# Patient Record
Sex: Female | Born: 1955
Health system: Southern US, Community
[De-identification: ages and names within clinical notes are randomized; demographics above are authoritative.]

## PROBLEM LIST (undated history)

## (undated) DIAGNOSIS — F329 Major depressive disorder, single episode, unspecified: Secondary | ICD-10-CM

## (undated) DIAGNOSIS — I10 Essential (primary) hypertension: Secondary | ICD-10-CM

## (undated) DIAGNOSIS — E785 Hyperlipidemia, unspecified: Secondary | ICD-10-CM

## (undated) DIAGNOSIS — K5790 Diverticulosis of intestine, part unspecified, without perforation or abscess without bleeding: Secondary | ICD-10-CM

## (undated) DIAGNOSIS — I351 Nonrheumatic aortic (valve) insufficiency: Secondary | ICD-10-CM

## (undated) DIAGNOSIS — G709 Myoneural disorder, unspecified: Secondary | ICD-10-CM

## (undated) DIAGNOSIS — G4700 Insomnia, unspecified: Secondary | ICD-10-CM

## (undated) DIAGNOSIS — Z5189 Encounter for other specified aftercare: Secondary | ICD-10-CM

## (undated) DIAGNOSIS — F419 Anxiety disorder, unspecified: Secondary | ICD-10-CM

## (undated) DIAGNOSIS — M1990 Unspecified osteoarthritis, unspecified site: Secondary | ICD-10-CM

## (undated) DIAGNOSIS — F32A Depression, unspecified: Secondary | ICD-10-CM

## (undated) DIAGNOSIS — R011 Cardiac murmur, unspecified: Secondary | ICD-10-CM

## (undated) DIAGNOSIS — Z9889 Other specified postprocedural states: Secondary | ICD-10-CM

## (undated) DIAGNOSIS — M2550 Pain in unspecified joint: Secondary | ICD-10-CM

## (undated) DIAGNOSIS — C19 Malignant neoplasm of rectosigmoid junction: Secondary | ICD-10-CM

## (undated) DIAGNOSIS — C801 Malignant (primary) neoplasm, unspecified: Secondary | ICD-10-CM

## (undated) DIAGNOSIS — E7800 Pure hypercholesterolemia, unspecified: Secondary | ICD-10-CM

## (undated) DIAGNOSIS — Z9289 Personal history of other medical treatment: Secondary | ICD-10-CM

## (undated) DIAGNOSIS — G629 Polyneuropathy, unspecified: Secondary | ICD-10-CM

## (undated) DIAGNOSIS — D649 Anemia, unspecified: Secondary | ICD-10-CM

## (undated) DIAGNOSIS — R112 Nausea with vomiting, unspecified: Secondary | ICD-10-CM

## (undated) DIAGNOSIS — Z86010 Personal history of colonic polyps: Secondary | ICD-10-CM

## (undated) DIAGNOSIS — R351 Nocturia: Secondary | ICD-10-CM

## (undated) HISTORY — DX: Anxiety disorder, unspecified: F41.9

## (undated) HISTORY — DX: Depression, unspecified: F32.A

## (undated) HISTORY — DX: Essential (primary) hypertension: I10

## (undated) HISTORY — PX: JOINT REPLACEMENT: SHX530

## (undated) HISTORY — DX: Encounter for other specified aftercare: Z51.89

## (undated) HISTORY — DX: Unspecified osteoarthritis, unspecified site: M19.90

## (undated) HISTORY — DX: Major depressive disorder, single episode, unspecified: F32.9

## (undated) HISTORY — DX: Cardiac murmur, unspecified: R01.1

## (undated) HISTORY — DX: Malignant (primary) neoplasm, unspecified: C80.1

## (undated) HISTORY — DX: Hyperlipidemia, unspecified: E78.5

## (undated) HISTORY — PX: COLONOSCOPY: SHX174

## (undated) HISTORY — DX: Myoneural disorder, unspecified: G70.9

## (undated) HISTORY — PX: SPINE SURGERY: SHX786

## (undated) HISTORY — PX: COLON SURGERY: SHX602

## (undated) HISTORY — DX: Malignant neoplasm of rectosigmoid junction: C19

---

## 1989-07-11 HISTORY — PX: DILATION AND CURETTAGE OF UTERUS: SHX78

## 1998-02-13 ENCOUNTER — Other Ambulatory Visit: Admission: RE | Admit: 1998-02-13 | Discharge: 1998-02-13 | Payer: Self-pay | Admitting: Obstetrics and Gynecology

## 1999-05-28 ENCOUNTER — Other Ambulatory Visit: Admission: RE | Admit: 1999-05-28 | Discharge: 1999-05-28 | Payer: Self-pay | Admitting: Obstetrics and Gynecology

## 2001-02-02 ENCOUNTER — Other Ambulatory Visit: Admission: RE | Admit: 2001-02-02 | Discharge: 2001-02-02 | Payer: Self-pay | Admitting: Obstetrics and Gynecology

## 2002-07-11 DIAGNOSIS — C19 Malignant neoplasm of rectosigmoid junction: Secondary | ICD-10-CM | POA: Insufficient documentation

## 2002-07-11 HISTORY — DX: Malignant neoplasm of rectosigmoid junction: C19

## 2002-09-13 ENCOUNTER — Other Ambulatory Visit: Admission: RE | Admit: 2002-09-13 | Discharge: 2002-09-13 | Payer: Self-pay | Admitting: Obstetrics and Gynecology

## 2003-02-09 HISTORY — PX: COLECTOMY: SHX59

## 2003-02-20 ENCOUNTER — Encounter: Payer: Self-pay | Admitting: Internal Medicine

## 2003-02-20 ENCOUNTER — Ambulatory Visit (HOSPITAL_COMMUNITY): Admission: RE | Admit: 2003-02-20 | Discharge: 2003-02-20 | Payer: Self-pay | Admitting: Internal Medicine

## 2003-02-20 DIAGNOSIS — Z85038 Personal history of other malignant neoplasm of large intestine: Secondary | ICD-10-CM | POA: Insufficient documentation

## 2003-02-20 DIAGNOSIS — K5730 Diverticulosis of large intestine without perforation or abscess without bleeding: Secondary | ICD-10-CM

## 2003-02-20 DIAGNOSIS — D126 Benign neoplasm of colon, unspecified: Secondary | ICD-10-CM | POA: Insufficient documentation

## 2003-02-20 HISTORY — DX: Personal history of other malignant neoplasm of large intestine: Z85.038

## 2003-02-20 HISTORY — DX: Diverticulosis of large intestine without perforation or abscess without bleeding: K57.30

## 2003-02-20 HISTORY — DX: Benign neoplasm of colon, unspecified: D12.6

## 2003-03-05 ENCOUNTER — Encounter: Payer: Self-pay | Admitting: General Surgery

## 2003-03-10 ENCOUNTER — Encounter: Payer: Self-pay | Admitting: Internal Medicine

## 2003-03-10 ENCOUNTER — Inpatient Hospital Stay (HOSPITAL_COMMUNITY): Admission: RE | Admit: 2003-03-10 | Discharge: 2003-03-18 | Payer: Self-pay | Admitting: General Surgery

## 2003-03-10 ENCOUNTER — Encounter (INDEPENDENT_AMBULATORY_CARE_PROVIDER_SITE_OTHER): Payer: Self-pay | Admitting: *Deleted

## 2003-03-10 ENCOUNTER — Encounter (INDEPENDENT_AMBULATORY_CARE_PROVIDER_SITE_OTHER): Payer: Self-pay | Admitting: Specialist

## 2003-04-29 ENCOUNTER — Ambulatory Visit (HOSPITAL_BASED_OUTPATIENT_CLINIC_OR_DEPARTMENT_OTHER): Admission: RE | Admit: 2003-04-29 | Discharge: 2003-04-29 | Payer: Self-pay | Admitting: General Surgery

## 2003-04-29 ENCOUNTER — Encounter: Payer: Self-pay | Admitting: General Surgery

## 2003-05-09 ENCOUNTER — Ambulatory Visit: Admission: RE | Admit: 2003-05-09 | Discharge: 2003-07-09 | Payer: Self-pay | Admitting: *Deleted

## 2003-08-08 ENCOUNTER — Ambulatory Visit: Admission: RE | Admit: 2003-08-08 | Discharge: 2003-08-08 | Payer: Self-pay | Admitting: *Deleted

## 2003-10-10 ENCOUNTER — Other Ambulatory Visit: Admission: RE | Admit: 2003-10-10 | Discharge: 2003-10-10 | Payer: Self-pay | Admitting: Obstetrics and Gynecology

## 2004-02-10 ENCOUNTER — Ambulatory Visit (HOSPITAL_COMMUNITY): Admission: RE | Admit: 2004-02-10 | Discharge: 2004-02-10 | Payer: Self-pay | Admitting: Oncology

## 2004-03-05 ENCOUNTER — Encounter: Payer: Self-pay | Admitting: Internal Medicine

## 2004-03-05 DIAGNOSIS — K521 Toxic gastroenteritis and colitis: Secondary | ICD-10-CM

## 2004-04-13 ENCOUNTER — Ambulatory Visit (HOSPITAL_COMMUNITY): Admission: RE | Admit: 2004-04-13 | Discharge: 2004-04-13 | Payer: Self-pay | Admitting: Oncology

## 2004-05-13 ENCOUNTER — Encounter (HOSPITAL_COMMUNITY): Admission: RE | Admit: 2004-05-13 | Discharge: 2004-08-11 | Payer: Self-pay | Admitting: Oncology

## 2004-06-22 ENCOUNTER — Ambulatory Visit: Payer: Self-pay | Admitting: Oncology

## 2004-08-13 ENCOUNTER — Ambulatory Visit: Payer: Self-pay | Admitting: Oncology

## 2004-10-08 ENCOUNTER — Ambulatory Visit: Payer: Self-pay | Admitting: Oncology

## 2004-11-25 ENCOUNTER — Other Ambulatory Visit: Admission: RE | Admit: 2004-11-25 | Discharge: 2004-11-25 | Payer: Self-pay | Admitting: Obstetrics and Gynecology

## 2004-12-13 ENCOUNTER — Ambulatory Visit: Payer: Self-pay | Admitting: Oncology

## 2005-02-07 ENCOUNTER — Ambulatory Visit: Payer: Self-pay | Admitting: Oncology

## 2005-02-08 ENCOUNTER — Ambulatory Visit (HOSPITAL_COMMUNITY): Admission: RE | Admit: 2005-02-08 | Discharge: 2005-02-08 | Payer: Self-pay | Admitting: Oncology

## 2005-03-23 ENCOUNTER — Encounter: Admission: RE | Admit: 2005-03-23 | Discharge: 2005-03-23 | Payer: Self-pay | Admitting: Neurology

## 2005-04-04 ENCOUNTER — Ambulatory Visit: Payer: Self-pay | Admitting: Oncology

## 2005-06-06 ENCOUNTER — Ambulatory Visit: Payer: Self-pay | Admitting: Oncology

## 2005-08-08 ENCOUNTER — Ambulatory Visit: Payer: Self-pay | Admitting: Oncology

## 2005-08-11 HISTORY — PX: LUMBAR DISC SURGERY: SHX700

## 2005-09-02 ENCOUNTER — Ambulatory Visit (HOSPITAL_COMMUNITY): Admission: RE | Admit: 2005-09-02 | Discharge: 2005-09-03 | Payer: Self-pay | Admitting: Neurosurgery

## 2005-10-04 ENCOUNTER — Ambulatory Visit: Payer: Self-pay | Admitting: Oncology

## 2005-12-01 ENCOUNTER — Ambulatory Visit: Payer: Self-pay | Admitting: Oncology

## 2005-12-06 LAB — COMPREHENSIVE METABOLIC PANEL
ALT: 26 U/L (ref 0–40)
AST: 20 U/L (ref 0–37)
Albumin: 4.5 g/dL (ref 3.5–5.2)
Alkaline Phosphatase: 67 U/L (ref 39–117)
Calcium: 9 mg/dL (ref 8.4–10.5)
Chloride: 103 mEq/L (ref 96–112)
Creatinine, Ser: 0.7 mg/dL (ref 0.4–1.2)
Potassium: 4.1 mEq/L (ref 3.5–5.3)

## 2005-12-06 LAB — CBC WITH DIFFERENTIAL/PLATELET
Basophils Absolute: 0 10*3/uL (ref 0.0–0.1)
Eosinophils Absolute: 0.1 10*3/uL (ref 0.0–0.5)
HGB: 13.5 g/dL (ref 11.6–15.9)
MONO#: 0.5 10*3/uL (ref 0.1–0.9)
NEUT#: 3.1 10*3/uL (ref 1.5–6.5)
RBC: 4.61 10*6/uL (ref 3.70–5.32)
RDW: 13.3 % (ref 11.3–14.5)
WBC: 5.7 10*3/uL (ref 3.9–10.0)
lymph#: 2 10*3/uL (ref 0.9–3.3)

## 2005-12-06 LAB — CEA: CEA: 0.7 ng/mL (ref 0.0–5.0)

## 2006-01-26 ENCOUNTER — Ambulatory Visit: Payer: Self-pay | Admitting: Oncology

## 2006-02-16 ENCOUNTER — Other Ambulatory Visit: Admission: RE | Admit: 2006-02-16 | Discharge: 2006-02-16 | Payer: Self-pay | Admitting: Obstetrics and Gynecology

## 2006-03-02 ENCOUNTER — Ambulatory Visit: Payer: Self-pay | Admitting: Internal Medicine

## 2006-03-31 ENCOUNTER — Ambulatory Visit: Payer: Self-pay | Admitting: Oncology

## 2006-04-04 ENCOUNTER — Ambulatory Visit (HOSPITAL_COMMUNITY): Admission: RE | Admit: 2006-04-04 | Discharge: 2006-04-04 | Payer: Self-pay | Admitting: Oncology

## 2006-04-04 LAB — CBC WITH DIFFERENTIAL/PLATELET
BASO%: 0.4 % (ref 0.0–2.0)
Basophils Absolute: 0 10*3/uL (ref 0.0–0.1)
HGB: 14.2 g/dL (ref 11.6–15.9)
LYMPH%: 32.8 % (ref 14.0–48.0)
MCH: 30.2 pg (ref 26.0–34.0)
MCHC: 34.7 g/dL (ref 32.0–36.0)
MONO#: 0.5 10*3/uL (ref 0.1–0.9)
NEUT#: 3.2 10*3/uL (ref 1.5–6.5)
NEUT%: 56 % (ref 39.6–76.8)

## 2006-04-04 LAB — COMPREHENSIVE METABOLIC PANEL
ALT: 29 U/L (ref 0–40)
BUN: 13 mg/dL (ref 6–23)
CO2: 27 mEq/L (ref 19–32)
Creatinine, Ser: 0.76 mg/dL (ref 0.40–1.20)
Total Bilirubin: 0.6 mg/dL (ref 0.3–1.2)

## 2006-04-04 LAB — LACTATE DEHYDROGENASE: LDH: 131 U/L (ref 94–250)

## 2006-04-04 LAB — CEA: CEA: 0.7 ng/mL (ref 0.0–5.0)

## 2006-04-19 ENCOUNTER — Ambulatory Visit (HOSPITAL_COMMUNITY): Admission: RE | Admit: 2006-04-19 | Discharge: 2006-04-19 | Payer: Self-pay | Admitting: Oncology

## 2006-04-19 ENCOUNTER — Encounter (INDEPENDENT_AMBULATORY_CARE_PROVIDER_SITE_OTHER): Payer: Self-pay | Admitting: *Deleted

## 2006-05-09 ENCOUNTER — Ambulatory Visit (HOSPITAL_BASED_OUTPATIENT_CLINIC_OR_DEPARTMENT_OTHER): Admission: RE | Admit: 2006-05-09 | Discharge: 2006-05-09 | Payer: Self-pay | Admitting: General Surgery

## 2006-07-27 ENCOUNTER — Ambulatory Visit: Payer: Self-pay | Admitting: Oncology

## 2006-08-01 LAB — CBC WITH DIFFERENTIAL/PLATELET
Basophils Absolute: 0 10*3/uL (ref 0.0–0.1)
EOS%: 1 % (ref 0.0–7.0)
Eosinophils Absolute: 0.1 10*3/uL (ref 0.0–0.5)
HGB: 14.9 g/dL (ref 11.6–15.9)
LYMPH%: 27.9 % (ref 14.0–48.0)
MCH: 31.1 pg (ref 26.0–34.0)
MCV: 87.8 fL (ref 81.0–101.0)
MONO%: 6.7 % (ref 0.0–13.0)
Platelets: 180 10*3/uL (ref 145–400)
RDW: 12.4 % (ref 11.3–14.5)

## 2006-08-01 LAB — COMPREHENSIVE METABOLIC PANEL
Alkaline Phosphatase: 63 U/L (ref 39–117)
BUN: 17 mg/dL (ref 6–23)
CO2: 22 mEq/L (ref 19–32)
Creatinine, Ser: 0.79 mg/dL (ref 0.40–1.20)
Glucose, Bld: 121 mg/dL — ABNORMAL HIGH (ref 70–99)
Sodium: 140 mEq/L (ref 135–145)
Total Bilirubin: 0.6 mg/dL (ref 0.3–1.2)

## 2006-10-25 ENCOUNTER — Encounter: Admission: RE | Admit: 2006-10-25 | Discharge: 2006-10-25 | Payer: Self-pay | Admitting: Neurosurgery

## 2006-11-20 ENCOUNTER — Ambulatory Visit: Payer: Self-pay | Admitting: Oncology

## 2006-11-20 LAB — CBC WITH DIFFERENTIAL/PLATELET
BASO%: 0.6 % (ref 0.0–2.0)
EOS%: 0.9 % (ref 0.0–7.0)
LYMPH%: 28.4 % (ref 14.0–48.0)
MCH: 30.6 pg (ref 26.0–34.0)
MCHC: 35.4 g/dL (ref 32.0–36.0)
MCV: 86.6 fL (ref 81.0–101.0)
MONO%: 7.5 % (ref 0.0–13.0)
NEUT%: 62.6 % (ref 39.6–76.8)
Platelets: 196 10*3/uL (ref 145–400)
RBC: 4.47 10*6/uL (ref 3.70–5.32)
WBC: 6.3 10*3/uL (ref 3.9–10.0)

## 2006-11-20 LAB — COMPREHENSIVE METABOLIC PANEL
ALT: 16 U/L (ref 0–35)
Alkaline Phosphatase: 70 U/L (ref 39–117)
Creatinine, Ser: 0.74 mg/dL (ref 0.40–1.20)
Sodium: 139 mEq/L (ref 135–145)
Total Bilirubin: 0.4 mg/dL (ref 0.3–1.2)
Total Protein: 7.5 g/dL (ref 6.0–8.3)

## 2007-02-19 ENCOUNTER — Ambulatory Visit: Payer: Self-pay | Admitting: Internal Medicine

## 2007-03-15 ENCOUNTER — Ambulatory Visit: Payer: Self-pay | Admitting: Oncology

## 2007-03-19 LAB — CEA: CEA: 0.9 ng/mL (ref 0.0–5.0)

## 2007-03-19 LAB — COMPREHENSIVE METABOLIC PANEL
Alkaline Phosphatase: 58 U/L (ref 39–117)
BUN: 22 mg/dL (ref 6–23)
Creatinine, Ser: 0.8 mg/dL (ref 0.40–1.20)
Glucose, Bld: 100 mg/dL — ABNORMAL HIGH (ref 70–99)
Sodium: 137 mEq/L (ref 135–145)
Total Bilirubin: 0.6 mg/dL (ref 0.3–1.2)
Total Protein: 7.5 g/dL (ref 6.0–8.3)

## 2007-03-19 LAB — CBC WITH DIFFERENTIAL/PLATELET
Basophils Absolute: 0 10*3/uL (ref 0.0–0.1)
EOS%: 0.7 % (ref 0.0–7.0)
Eosinophils Absolute: 0 10*3/uL (ref 0.0–0.5)
HCT: 38.5 % (ref 34.8–46.6)
HGB: 14.1 g/dL (ref 11.6–15.9)
MCH: 31.6 pg (ref 26.0–34.0)
MCV: 86.6 fL (ref 81.0–101.0)
NEUT#: 3.6 10*3/uL (ref 1.5–6.5)
NEUT%: 62 % (ref 39.6–76.8)
RDW: 12 % (ref 11.3–14.5)
lymph#: 1.7 10*3/uL (ref 0.9–3.3)

## 2007-04-17 ENCOUNTER — Encounter (INDEPENDENT_AMBULATORY_CARE_PROVIDER_SITE_OTHER): Payer: Self-pay | Admitting: *Deleted

## 2007-04-17 ENCOUNTER — Ambulatory Visit (HOSPITAL_COMMUNITY): Admission: RE | Admit: 2007-04-17 | Discharge: 2007-04-17 | Payer: Self-pay | Admitting: Oncology

## 2007-07-17 ENCOUNTER — Ambulatory Visit: Payer: Self-pay | Admitting: Oncology

## 2007-07-19 LAB — COMPREHENSIVE METABOLIC PANEL
ALT: 27 U/L (ref 0–35)
Albumin: 4.5 g/dL (ref 3.5–5.2)
BUN: 17 mg/dL (ref 6–23)
CO2: 24 mEq/L (ref 19–32)
Calcium: 9.2 mg/dL (ref 8.4–10.5)
Chloride: 105 mEq/L (ref 96–112)
Creatinine, Ser: 0.75 mg/dL (ref 0.40–1.20)
Potassium: 4 mEq/L (ref 3.5–5.3)

## 2007-07-19 LAB — CBC WITH DIFFERENTIAL/PLATELET
Basophils Absolute: 0 10*3/uL (ref 0.0–0.1)
HCT: 40.5 % (ref 34.8–46.6)
HGB: 14.2 g/dL (ref 11.6–15.9)
MONO#: 0.5 10*3/uL (ref 0.1–0.9)
NEUT#: 3.5 10*3/uL (ref 1.5–6.5)
NEUT%: 62.9 % (ref 39.6–76.8)
RDW: 12.2 % (ref 11.3–14.5)
WBC: 5.5 10*3/uL (ref 3.9–10.0)
lymph#: 1.4 10*3/uL (ref 0.9–3.3)

## 2007-07-19 LAB — CEA: CEA: <0.5 ng/mL (ref 0.0–5.0)

## 2007-10-13 DIAGNOSIS — K429 Umbilical hernia without obstruction or gangrene: Secondary | ICD-10-CM

## 2007-10-13 DIAGNOSIS — G609 Hereditary and idiopathic neuropathy, unspecified: Secondary | ICD-10-CM

## 2007-10-13 DIAGNOSIS — I10 Essential (primary) hypertension: Secondary | ICD-10-CM | POA: Insufficient documentation

## 2007-10-13 HISTORY — DX: Umbilical hernia without obstruction or gangrene: K42.9

## 2007-10-13 HISTORY — DX: Hereditary and idiopathic neuropathy, unspecified: G60.9

## 2007-10-13 HISTORY — DX: Essential (primary) hypertension: I10

## 2007-11-15 ENCOUNTER — Ambulatory Visit: Payer: Self-pay | Admitting: Oncology

## 2007-11-19 ENCOUNTER — Encounter: Payer: Self-pay | Admitting: Internal Medicine

## 2007-11-19 LAB — CBC WITH DIFFERENTIAL/PLATELET
Basophils Absolute: 0 10*3/uL (ref 0.0–0.1)
HCT: 37.7 % (ref 34.8–46.6)
HGB: 13.4 g/dL (ref 11.6–15.9)
MONO#: 0.4 10*3/uL (ref 0.1–0.9)
NEUT#: 2.8 10*3/uL (ref 1.5–6.5)
NEUT%: 60.4 % (ref 39.6–76.8)
WBC: 4.7 10*3/uL (ref 3.9–10.0)
lymph#: 1.4 10*3/uL (ref 0.9–3.3)

## 2007-11-19 LAB — COMPREHENSIVE METABOLIC PANEL
ALT: 22 U/L (ref 0–35)
Albumin: 4.2 g/dL (ref 3.5–5.2)
BUN: 18 mg/dL (ref 6–23)
CO2: 24 mEq/L (ref 19–32)
Calcium: 8.9 mg/dL (ref 8.4–10.5)
Chloride: 105 mEq/L (ref 96–112)
Creatinine, Ser: 0.77 mg/dL (ref 0.40–1.20)

## 2007-11-19 LAB — LACTATE DEHYDROGENASE: LDH: 126 U/L (ref 94–250)

## 2008-04-04 ENCOUNTER — Ambulatory Visit: Payer: Self-pay | Admitting: Oncology

## 2008-04-08 ENCOUNTER — Encounter: Payer: Self-pay | Admitting: Internal Medicine

## 2008-04-08 ENCOUNTER — Ambulatory Visit (HOSPITAL_COMMUNITY): Admission: RE | Admit: 2008-04-08 | Discharge: 2008-04-08 | Payer: Self-pay | Admitting: Oncology

## 2008-04-08 LAB — CBC WITH DIFFERENTIAL/PLATELET
Basophils Absolute: 0 10*3/uL (ref 0.0–0.1)
EOS%: 1.4 % (ref 0.0–7.0)
Eosinophils Absolute: 0.1 10*3/uL (ref 0.0–0.5)
LYMPH%: 19.5 % (ref 14.0–48.0)
MCH: 31.7 pg (ref 26.0–34.0)
MCV: 89.8 fL (ref 81.0–101.0)
MONO%: 7.1 % (ref 0.0–13.0)
NEUT#: 4.1 10*3/uL (ref 1.5–6.5)
Platelets: 159 10*3/uL (ref 145–400)
RBC: 4.52 10*6/uL (ref 3.70–5.32)
RDW: 12.2 % (ref 11.3–14.5)

## 2008-04-08 LAB — COMPREHENSIVE METABOLIC PANEL WITH GFR
ALT: 26 U/L (ref 0–35)
AST: 17 U/L (ref 0–37)
Albumin: 4.2 g/dL (ref 3.5–5.2)
Alkaline Phosphatase: 65 U/L (ref 39–117)
BUN: 13 mg/dL (ref 6–23)
CO2: 25 meq/L (ref 19–32)
Calcium: 9 mg/dL (ref 8.4–10.5)
Chloride: 104 meq/L (ref 96–112)
Creatinine, Ser: 0.82 mg/dL (ref 0.40–1.20)
Glucose, Bld: 142 mg/dL — ABNORMAL HIGH (ref 70–99)
Potassium: 4.6 meq/L (ref 3.5–5.3)
Sodium: 140 meq/L (ref 135–145)
Total Bilirubin: 0.6 mg/dL (ref 0.3–1.2)
Total Protein: 7.1 g/dL (ref 6.0–8.3)

## 2008-04-08 LAB — LACTATE DEHYDROGENASE: LDH: 135 U/L (ref 94–250)

## 2008-09-25 ENCOUNTER — Ambulatory Visit: Payer: Self-pay | Admitting: Oncology

## 2008-09-29 ENCOUNTER — Encounter: Payer: Self-pay | Admitting: Internal Medicine

## 2008-09-29 LAB — COMPREHENSIVE METABOLIC PANEL
ALT: 28 U/L (ref 0–35)
AST: 20 U/L (ref 0–37)
Albumin: 3.9 g/dL (ref 3.5–5.2)
Alkaline Phosphatase: 45 U/L (ref 39–117)
BUN: 17 mg/dL (ref 6–23)
Calcium: 9 mg/dL (ref 8.4–10.5)
Chloride: 100 mEq/L (ref 96–112)
Potassium: 4 mEq/L (ref 3.5–5.3)
Sodium: 135 mEq/L (ref 135–145)

## 2008-09-29 LAB — CBC WITH DIFFERENTIAL/PLATELET
BASO%: 0.2 % (ref 0.0–2.0)
Basophils Absolute: 0 10*3/uL (ref 0.0–0.1)
EOS%: 0.4 % (ref 0.0–7.0)
HGB: 13 g/dL (ref 11.6–15.9)
MCH: 31.8 pg (ref 25.1–34.0)
MCHC: 35.4 g/dL (ref 31.5–36.0)
MCV: 89.7 fL (ref 79.5–101.0)
MONO%: 8.6 % (ref 0.0–14.0)
RDW: 12.2 % (ref 11.2–14.5)
lymph#: 1.4 10*3/uL (ref 0.9–3.3)

## 2008-09-29 LAB — LACTATE DEHYDROGENASE: LDH: 114 U/L (ref 94–250)

## 2008-11-26 ENCOUNTER — Ambulatory Visit (HOSPITAL_COMMUNITY): Admission: RE | Admit: 2008-11-26 | Discharge: 2008-11-26 | Payer: Self-pay | Admitting: General Surgery

## 2008-11-26 ENCOUNTER — Encounter (INDEPENDENT_AMBULATORY_CARE_PROVIDER_SITE_OTHER): Payer: Self-pay | Admitting: *Deleted

## 2009-03-23 ENCOUNTER — Encounter (INDEPENDENT_AMBULATORY_CARE_PROVIDER_SITE_OTHER): Payer: Self-pay | Admitting: *Deleted

## 2009-03-24 ENCOUNTER — Ambulatory Visit: Payer: Self-pay | Admitting: Oncology

## 2009-03-26 ENCOUNTER — Ambulatory Visit (HOSPITAL_COMMUNITY): Admission: RE | Admit: 2009-03-26 | Discharge: 2009-03-26 | Payer: Self-pay | Admitting: Oncology

## 2009-03-26 ENCOUNTER — Encounter: Payer: Self-pay | Admitting: Internal Medicine

## 2009-03-26 LAB — CEA: CEA: 0.9 ng/mL (ref 0.0–5.0)

## 2009-03-26 LAB — COMPREHENSIVE METABOLIC PANEL
ALT: 31 U/L (ref 0–35)
Albumin: 4 g/dL (ref 3.5–5.2)
CO2: 25 mEq/L (ref 19–32)
Calcium: 8.9 mg/dL (ref 8.4–10.5)
Chloride: 105 mEq/L (ref 96–112)
Glucose, Bld: 110 mg/dL — ABNORMAL HIGH (ref 70–99)
Potassium: 3.7 mEq/L (ref 3.5–5.3)
Sodium: 135 mEq/L (ref 135–145)
Total Protein: 7.1 g/dL (ref 6.0–8.3)

## 2009-03-26 LAB — CBC WITH DIFFERENTIAL/PLATELET
BASO%: 0.2 % (ref 0.0–2.0)
Eosinophils Absolute: 0.1 10*3/uL (ref 0.0–0.5)
LYMPH%: 23.7 % (ref 14.0–49.7)
MCHC: 34.3 g/dL (ref 31.5–36.0)
MONO#: 0.6 10*3/uL (ref 0.1–0.9)
NEUT#: 3.8 10*3/uL (ref 1.5–6.5)
Platelets: 149 10*3/uL (ref 145–400)
RBC: 4.22 10*6/uL (ref 3.70–5.45)
RDW: 12.3 % (ref 11.2–14.5)
WBC: 5.9 10*3/uL (ref 3.9–10.3)
lymph#: 1.4 10*3/uL (ref 0.9–3.3)

## 2009-03-26 LAB — LACTATE DEHYDROGENASE: LDH: 105 U/L (ref 94–250)

## 2009-05-01 ENCOUNTER — Ambulatory Visit: Payer: Self-pay | Admitting: Internal Medicine

## 2009-07-16 ENCOUNTER — Ambulatory Visit: Payer: Self-pay | Admitting: Internal Medicine

## 2009-07-17 ENCOUNTER — Encounter: Payer: Self-pay | Admitting: Internal Medicine

## 2009-09-21 ENCOUNTER — Ambulatory Visit: Payer: Self-pay | Admitting: Oncology

## 2009-09-24 ENCOUNTER — Encounter: Payer: Self-pay | Admitting: Internal Medicine

## 2009-09-24 LAB — CBC WITH DIFFERENTIAL/PLATELET
BASO%: 0.3 % (ref 0.0–2.0)
EOS%: 0.8 % (ref 0.0–7.0)
HCT: 37.1 % (ref 34.8–46.6)
MCH: 32.5 pg (ref 25.1–34.0)
MCHC: 35.6 g/dL (ref 31.5–36.0)
MONO%: 10 % (ref 0.0–14.0)
NEUT%: 66.4 % (ref 38.4–76.8)
lymph#: 1.2 10*3/uL (ref 0.9–3.3)

## 2009-09-24 LAB — COMPREHENSIVE METABOLIC PANEL
ALT: 23 U/L (ref 0–35)
AST: 22 U/L (ref 0–37)
Alkaline Phosphatase: 40 U/L (ref 39–117)
Calcium: 8.7 mg/dL (ref 8.4–10.5)
Chloride: 101 mEq/L (ref 96–112)
Creatinine, Ser: 0.78 mg/dL (ref 0.40–1.20)
Total Bilirubin: 0.5 mg/dL (ref 0.3–1.2)

## 2010-03-23 ENCOUNTER — Ambulatory Visit: Payer: Self-pay | Admitting: Oncology

## 2010-03-26 ENCOUNTER — Ambulatory Visit (HOSPITAL_COMMUNITY)
Admission: RE | Admit: 2010-03-26 | Discharge: 2010-03-26 | Payer: Self-pay | Source: Home / Self Care | Admitting: Oncology

## 2010-03-26 ENCOUNTER — Encounter: Payer: Self-pay | Admitting: Internal Medicine

## 2010-03-26 LAB — CBC WITH DIFFERENTIAL/PLATELET
Eosinophils Absolute: 0 10*3/uL (ref 0.0–0.5)
MONO#: 0.4 10*3/uL (ref 0.1–0.9)
NEUT#: 3.6 10*3/uL (ref 1.5–6.5)
RBC: 4.39 10*6/uL (ref 3.70–5.45)
RDW: 12.1 % (ref 11.2–14.5)
WBC: 5.7 10*3/uL (ref 3.9–10.3)

## 2010-03-26 LAB — COMPREHENSIVE METABOLIC PANEL
Albumin: 4.4 g/dL (ref 3.5–5.2)
Alkaline Phosphatase: 44 U/L (ref 39–117)
CO2: 27 mEq/L (ref 19–32)
Glucose, Bld: 110 mg/dL — ABNORMAL HIGH (ref 70–99)
Potassium: 3.9 mEq/L (ref 3.5–5.3)
Sodium: 136 mEq/L (ref 135–145)
Total Protein: 7.6 g/dL (ref 6.0–8.3)

## 2010-03-26 LAB — CEA: CEA: 0.6 ng/mL (ref 0.0–5.0)

## 2010-03-26 LAB — LACTATE DEHYDROGENASE: LDH: 107 U/L (ref 94–250)

## 2010-08-10 NOTE — Letter (Signed)
Summary: [facility]  [facility]   Imported By: Lennie Odor 04/09/2010 10:17:37  _____________________________________________________________________  External Attachment:    Type:   Image     Comment:   External Document

## 2010-08-10 NOTE — Letter (Signed)
Summary: Patient Notice- Colon Biospy Results  [facility]  [address] [place], [place] [address]   Phone: [phone]  Fax: [phone]        July 17, 2009 MRN: [id number]    [name] [name] [address] [place], [place]  [address]    Dear [name],  I am pleased to inform you that the biopsies taken during your recent colonoscopy did not show any evidence of cancer upon pathologic examination.The polyps show normal colon tissue  Additional information/recommendations:  _x_No further action is needed at this time.  Please follow-up with      your primary care physician for your other healthcare needs.  __Please call [phone] to schedule a return visit to review      your condition.  __Continue with the treatment plan as outlined on the day of your      exam.  _x_You should have a repeat colonoscopy examination for this problem           in 5_ years.  Please call us if you are having persistent problems or have questions about your condition that have not been fully answered at this time.  Sincerely,  Hart Carwin MD   This letter has been electronically signed by your physician.  Appended Document: Patient Notice- Colon Biospy Results Letter mailed 01.12.11

## 2010-08-10 NOTE — Procedures (Signed)
Summary: Colonoscopy  Patient: [name] [name] Note: All result statuses are Final unless otherwise noted.  Tests: (1) Colonoscopy (COL)   COL Colonoscopy           DONE     [facility]     [address]     [place], [place]  [address]           COLONOSCOPY PROCEDURE REPORT           PATIENT:  [name], [name]  MR#:  [id number]     BIRTHDATE:  Jan 15, 1956, 53 yrs. old  GENDER:  female           ENDOSCOPIST:  Hedwig Morton. Juanda Chance, MD     Referred by:  Kimberlee Nearing, M.D.           PROCEDURE DATE:  07/16/2009     PROCEDURE:  Surveillance Colonoscopy     ASA CLASS:  Class II     INDICATIONS:  history of colon cancer sigmoid carcinoma 2004, s/p     sigmoid resection, radiation and chemoRx, last colon 2007     IBS-like symptoms           MEDICATIONS:   Versed 6 mg, Fentanyl 50 mcg           DESCRIPTION OF PROCEDURE:   After the risks benefits and     alternatives of the procedure were thoroughly explained, informed     consent was obtained.  Digital rectal exam was performed and     revealed no rectal masses.   The LB CF-[id number] endoscope     was introduced through the anus and advanced to the cecum, which     was identified by both the appendix and ileocecal valve, without     limitations.  The quality of the prep was good, using MiraLax.     The instrument was then slowly withdrawn as the colon was fully     examined.     <<PROCEDUREIMAGES>>           FINDINGS:  There was evidence of a prior segmental colectomy. in     the sigmoid colon. anastomosis at 5 cm, wide open, With standard     forceps, biopsy was obtained and sent to pathology (see image8 and     image7).  Mild diverticulosis was found throughout the colon (see     image6 and image5).  This was otherwise a normal examination of     the colon (see image1, image2, image3, and image9). mild radiation     changes in the rectum   Retroflexed views in the rectum revealed     no abnormalities.    The scope was  then withdrawn from the patient     and the procedure completed.           COMPLICATIONS:  None           ENDOSCOPIC IMPRESSION:     1) Prior segmental colectomy in the sigmoid colon     2) Mild diverticulosis throughout the colon     3) Otherwise normal examination     no evidence of recurrent carcinoma, functioning colorectal     anastomosis     RECOMMENDATIONS:     1) high fiber diet     2) Await biopsy results     Try Probiotics for IBS-like symptoms     Try Benefiber 1 tablespoon daily           REPEAT EXAM:  In 5 year(s) for.           ______________________________     Hedwig Morton. Juanda Chance, MD           CC:           n.     eSIGNED:   Hedwig Morton. Brodie at 07/16/2009 11:27 AM           Boris Sharper, [id number]  Note: An exclamation mark (!) indicates a result that was not dispersed into the flowsheet. Document Creation Date: 07/16/2009 1:00 PM _______________________________________________________________________  (1) Order result status: Final Collection or observation date-time: 07/16/2009 11:17 Requested date-time:  Receipt date-time:  Reported date-time:  Referring Physician:   Ordering Physician: Lina Sar [phone]) Specimen Source:  Source: Launa Grill Order Number: [id number] Lab site:   Appended Document: Colonoscopy recall colon in 5 years  Appended Document: Colonoscopy     Procedures Next Due Date:    Colonoscopy: 07/2014

## 2010-08-10 NOTE — Letter (Signed)
[facility]   Imported By: Lester Dibble 10/12/2009 09:52:52  _____________________________________________________________________  External Attachment:    Type:   Image     Comment:   External Document

## 2010-10-01 ENCOUNTER — Other Ambulatory Visit (HOSPITAL_COMMUNITY): Payer: Self-pay | Admitting: Oncology

## 2010-10-01 ENCOUNTER — Encounter (HOSPITAL_BASED_OUTPATIENT_CLINIC_OR_DEPARTMENT_OTHER): Payer: BC Managed Care – PPO | Admitting: Oncology

## 2010-10-01 DIAGNOSIS — Z85048 Personal history of other malignant neoplasm of rectum, rectosigmoid junction, and anus: Secondary | ICD-10-CM

## 2010-10-01 LAB — COMPREHENSIVE METABOLIC PANEL
AST: 26 U/L (ref 0–37)
Alkaline Phosphatase: 43 U/L (ref 39–117)
BUN: 16 mg/dL (ref 6–23)
Calcium: 8.9 mg/dL (ref 8.4–10.5)
Creatinine, Ser: 0.83 mg/dL (ref 0.40–1.20)
Glucose, Bld: 108 mg/dL — ABNORMAL HIGH (ref 70–99)
Potassium: 3.9 mEq/L (ref 3.5–5.3)
Sodium: 136 mEq/L (ref 135–145)

## 2010-10-01 LAB — CBC WITH DIFFERENTIAL/PLATELET
Basophils Absolute: 0 10*3/uL (ref 0.0–0.1)
Eosinophils Absolute: 0 10*3/uL (ref 0.0–0.5)
HCT: 35.8 % (ref 34.8–46.6)
HGB: 12.6 g/dL (ref 11.6–15.9)
LYMPH%: 25.1 % (ref 14.0–49.7)
MCV: 89.1 fL (ref 79.5–101.0)
MONO%: 10 % (ref 0.0–14.0)
NEUT#: 2.8 10*3/uL (ref 1.5–6.5)
NEUT%: 63.6 % (ref 38.4–76.8)
Platelets: 148 10*3/uL (ref 145–400)

## 2010-10-01 LAB — LACTATE DEHYDROGENASE: LDH: 103 U/L (ref 94–250)

## 2010-10-19 LAB — BASIC METABOLIC PANEL
CO2: 28 mEq/L (ref 19–32)
Calcium: 9.7 mg/dL (ref 8.4–10.5)
GFR calc Af Amer: >60 mL/min (ref >60)
GFR calc non Af Amer: >60 mL/min (ref >60)
Glucose, Bld: 112 mg/dL — ABNORMAL HIGH (ref 70–99)
Potassium: 5.1 mEq/L (ref 3.5–5.1)
Sodium: 140 mEq/L (ref 135–145)

## 2010-10-19 LAB — HEMOGLOBIN AND HEMATOCRIT, BLOOD: HCT: 37.6 % (ref 36.0–46.0)

## 2010-11-23 NOTE — Assessment & Plan Note (Signed)
[name] [name]                         GASTROENTEROLOGY OFFICE NOTE   NAME:[name], [name] MERAZ                      MRN:          [id number]  DATE:02/19/2007                            DOB:          1956/06/19    [name] [name] is a very nice 55 year old patient of Dr. Arline [name] in whom  was found sigmoid carcinoma, T3, N0, Stage II in August 2004.  She  underwent radiation and subsequent resection by Dr. Johna [name] in August  2004.  She also had chemotherapy from which she developed peripheral  neuropathy.  She has been disease free.  Last colonoscopy by me was  August 2005.  Last fall when requested colonoscopy, the patient's  insurance company, [name], recommended for her to go to  [name], which is not [name] accredited, and had a  colonoscopy there because it was cheaper.  The patient had a  colonoscopy, and we have the report from Dr. Noe [name] that her colon exam  was normal including the sigmoid anastomosis.  The patient since then  has had intermittent rectal bleeding and alternating diarrhea and  constipation.  More so, she is complaining of bloating, especially in  the morning, abdominal discomfort.  Last CT scan of the abdomen and  pelvis was in October 2007 by Dr. Arline [name] and did not show anything  suspicious.  We do not have the exact report, but from Dr. Mamie [name]  last Progress Note, there was a small umbilical hernia and areas of  vague shadowing or nodularity laterally within the left upper quadrant  and posterior medially within the right lower lobe, most likely  representing post inflammatory changes.  Her CA level was normal, the  last one 0.6.   MEDICATIONS:  1. Baclofen 75 mg p.o. daily.  2. Ambien 5 mg p.r.n.   PHYSICAL EXAMINATION:  VITAL SIGNS: Blood pressure 152/84, pulse 100.  Weight 172 pounds.  GENERAL:  The patient is overweight, in no distress.  CARDIOVASCULAR:  Rapid S1, S2.  ABDOMEN:  Protuberant  with normoactive bowel sounds.  Tender across the  lower abdomen, especially left lower quadrant.  Also some discomfort in  the right lower quadrant.  No fullness.  Upper abdomen was unremarkable.  Vertical surgical scar below the umbilicus.  Liver edge was at costal  margin.  No ascites.   Endoscopic exam shows erythematous perianal area with normal rectal  tone.  Proctitis due to radiation in the anal canal and left ampulla  showing some friability and spontaneous bleeding induced by contact with  the anoscope.  Stool was hemoccult positive with frank blood on the  tissue after the exam.   IMPRESSION:  23. A 55 year old white female with radiation proctitis since 2004      which explains her rectal bleeding.  2. Rectal sigmoid carcinoma 2004 status post radiation, resection, and      chemotherapy. Last colonoscopy in [name] 2007 was      unremarkable.  3. Bloating, abdominal discomfort, and irregular bowel habits.  May be      due to irritable bowel syndrome, possibly due to radiation  enteritis, or due to bacterial overgrowth.   PLAN:  1. Canasa suppositories 1000 mg nightly.  2. Bentyl 10 mg p.o. b.i.d. for antispasmodic effect.  3. Sample of probiotic Align 1 p.o. daily.  4. Booklet on gas.  5. Return in 2 months.  If symptoms do not improve, consider CT scan      of abdomen and pelvic floor to look for adenopathy or some      abnormality pertaining to her previous carcinoma.     [name] [name]. [name] Chance, MD  Electronically Signed    DMB/MedQ  DD: 02/19/2007  DT: 02/20/2007  Job #: [id number]   cc:   [name] [name], M.D.  [name] [name]. [name] [name], M.D.

## 2010-11-23 NOTE — Op Note (Signed)
[name] [name], [name] [name] NO.:  [id number]   MEDICAL RECORD NO.:  [id number]          PATIENT TYPE:  AMB   LOCATION:  DAY                          FACILITY:  [facility]   PHYSICIAN:  Sharlet Salina T. Hoxworth, M.D.DATE OF BIRTH:  [birth year]   DATE OF PROCEDURE:  11/26/2008  DATE OF DISCHARGE:                               OPERATIVE REPORT   PREOPERATIVE DIAGNOSIS:  Chronic anal fissure.   POSTOPERATIVE DIAGNOSIS:  Chronic anal fissure.   SURGICAL PROCEDURES:  Lateral internal anal sphincterotomy.   SURGEON:  Dr. Johna Sheriff.   ANESTHESIA:  LMA general.   BRIEF HISTORY:  [name] [name] is a 55 year old female who presented a  number of months ago with a posterior midline fissure.  This has  responded partially to diltiazem cream but she has had recurrent and  persistent symptoms lately despite medical management.  I have  recommended proceeding with internal anal sphincterotomy.  She has a  significant history of low anterior resection of the rectum for  adenocarcinoma following neoadjuvant chemoradiation therapy several  years ago.  Nature of procedure, indications, risks of bleeding,  infection, nonhealing and rare degrees of incontinence were discussed  and understood.  She is now brought to operating room for this  procedure.   DESCRIPTION OF OPERATION:  The patient brought to the operating room,  placed in supine position on the operating table and laryngeal mask  general anesthesia was induced.  She was then carefully positioned in  lithotomy position, the perineum sterilely prepped and draped.  Correct  patient and procedure were verified.  PAS were placed.  Examination of  anus with a Sims retractor showed a chronic posterior midline fissure  and obvious hypertrophy of the internal anal sphincter.  The  intersphincteric groove was identified in the right lateral position and  a small stab incision made.  Hemostat was inserted into the  intersphincteric groove and  the distal portion of the internal anal  sphincter elevated up into the wound.  The muscle actually fractured and  divided before it could be cauterized, possibly a little fibrotic and  stiff secondary to the radiation history.  This, however, resulted in  planned relaxation of the muscle.  Hemostasis was obtained with cautery  and pressure.  Thirty mL of Marcaine with epinephrine was injected as a  perianal block.  Gauze and Xylocaine dressing were applied and the  patient taken to recovery in good condition.      Lorne Skeens. Hoxworth, M.D.  Electronically Signed     BTH/MEDQ  D:  11/26/2008  T:  11/26/2008  Job:  [id number]

## 2010-11-26 NOTE — Discharge Summary (Signed)
NAMEREBEL, LAUGHRIDGE                         ACCOUNT NO.:  [id number]   MEDICAL RECORD NO.:  [id number]                   PATIENT TYPE:  INP   LOCATION:  0355                                 FACILITY:  [facility]   PHYSICIAN:  Sharlet Salina T. Hoxworth, M.D.          DATE OF BIRTH:  09-28-1955   DATE OF ADMISSION:  03/10/2003  DATE OF DISCHARGE:  03/18/2003                                 DISCHARGE SUMMARY   DISCHARGE DIAGNOSIS:  Adenocarcinoma of the rectosigmoid.   OPERATIONS AND PROCEDURES:  Low anterior resection of the rectosigmoid colon  on March 10, 2003.   HISTORY OF PRESENT ILLNESS:  [name] [name] is a very pleasant 55 year old  female.  The patient gives approximately a three week history of  intermittent blood per rectum.  She may of had some bleeding slightly  longer, but felt this was related to her menstrual cycle.  At that time, she  consulted with Dr. Ashley Royalty and was sent to Dr. Juanda Chance for evaluation.  Colonoscopy was performed with findings of a typical appearing carcinoma at  the rectosigmoid junction.  Biopsy revealed a moderately differentiated  adenocarcinoma.  This was partially obstructed.  Small benign appearing  polyps were removed at 90 and 30 cm as well.  The patient has generally been  feeling well.  No fatigue, malaise, or weight change.  No significant  abdominal or back pain.  Bowel movements have been slightly loose, but no  marked change.  A preoperative CT scan was obtained which revealed no  evidence of metastatic disease or adenopathy, but just some subtle  thickening at the rectosigmoid junction.  Following mechanical and  antibiotic bowel prep, the patient is admitted at this time for resection.   PAST MEDICAL HISTORY:  1. Wisdom tooth removal.  2. Mild hypertension, recently diagnosed at [facility] by Dr.     Salvadore Farber.   CURRENT MEDICATIONS:  Lisinopril 20/25 once daily.   ALLERGIES:  No known drug allergies.   SOCIAL HISTORY:  For  details, see H&P.   FAMILY HISTORY:  For details, see H&P.   REVIEW OF SYSTEMS:  For details, see H&P.   PHYSICAL EXAMINATION:  GENERAL:  A mildly overweight white female, otherwise  appears well.  RECTAL:  Physical exam was unremarkable except for rectal exam which  revealed a tumor palpable at the tip of the examining finger at about 7 cm.  This did not appear to be fixed.   LABORATORY DATA:  White count 8.8, hemoglobin was 15.1.  Chemistries within  normal limits.  LFT's all normal.   HOSPITAL COURSE:  The patient was admitted on the morning of her procedure  and underwent an uneventful low anterior resection of the rectosigmoid.  Postoperative course initially was very benign.  The Foley was discontinued  on the second postoperative day without any difficulties.  She had good pain  control and no fever.  Final pathology returned showing a T3  N0 colonic  adenocarcinoma with 10 negative nodes.  She was begun on a clear liquid  diet, but did have some occasional nausea with this.  She was maintained on  a liquid diet as she did have some nausea and bloating on the fifth and  sixth postoperative days.  By the seventh postoperative day, she had passed  gas and stools and was advanced to a regular day.  On the eighth  postoperative day, she was feeling much better.  The wound was well healed  and staples were removed.  She was having regular bowel movements,  tolerating a regular diet.  She was discharged home at this time.   MEDICATIONS:  Tylox or Tylenol as needed for pain.   FOLLOWUP:  In my office in 10 to 14 days.                                               Lorne Skeens. Hoxworth, M.D.    Tory Emerald  D:  03/25/2003  T:  03/25/2003  Job:  [id number]   cc:   Lina Sar, M.D. [facility]   Delano Metz, M.D.  [address]  [place]  [place] [address]  Fax: [phone]   Fayrene Fearing A. Ashley Royalty, M.D.  [address]  [place], [place] [address]  Fax: [phone]

## 2011-04-04 ENCOUNTER — Other Ambulatory Visit (HOSPITAL_COMMUNITY): Payer: Self-pay | Admitting: Oncology

## 2011-04-04 ENCOUNTER — Encounter (HOSPITAL_BASED_OUTPATIENT_CLINIC_OR_DEPARTMENT_OTHER): Payer: BC Managed Care – PPO | Admitting: Oncology

## 2011-04-04 DIAGNOSIS — R197 Diarrhea, unspecified: Secondary | ICD-10-CM

## 2011-04-04 DIAGNOSIS — K5900 Constipation, unspecified: Secondary | ICD-10-CM

## 2011-04-04 DIAGNOSIS — C20 Malignant neoplasm of rectum: Secondary | ICD-10-CM

## 2011-04-04 DIAGNOSIS — G620 Drug-induced polyneuropathy: Secondary | ICD-10-CM

## 2011-04-04 DIAGNOSIS — Z85048 Personal history of other malignant neoplasm of rectum, rectosigmoid junction, and anus: Secondary | ICD-10-CM

## 2011-04-04 LAB — CBC WITH DIFFERENTIAL/PLATELET
BASO%: 0.4 % (ref 0.0–2.0)
EOS%: 0.7 % (ref 0.0–7.0)
HCT: 37 % (ref 34.8–46.6)
LYMPH%: 23 % (ref 14.0–49.7)
MCH: 31.5 pg (ref 25.1–34.0)
MCHC: 35.1 g/dL (ref 31.5–36.0)
NEUT%: 68.8 % (ref 38.4–76.8)
Platelets: 155 10*3/uL (ref 145–400)

## 2011-04-04 LAB — COMPREHENSIVE METABOLIC PANEL
ALT: 20 U/L (ref 0–35)
AST: 17 U/L (ref 0–37)
Alkaline Phosphatase: 47 U/L (ref 39–117)
Creatinine, Ser: 0.65 mg/dL (ref 0.50–1.10)
Total Bilirubin: 0.3 mg/dL (ref 0.3–1.2)

## 2011-06-08 ENCOUNTER — Other Ambulatory Visit: Payer: Self-pay | Admitting: *Deleted

## 2011-06-08 ENCOUNTER — Encounter: Payer: Self-pay | Admitting: *Deleted

## 2011-06-08 DIAGNOSIS — Z85048 Personal history of other malignant neoplasm of rectum, rectosigmoid junction, and anus: Secondary | ICD-10-CM

## 2011-06-08 DIAGNOSIS — C20 Malignant neoplasm of rectum: Secondary | ICD-10-CM

## 2011-06-08 MED ORDER — PREGABALIN 75 MG PO CAPS: 75.0000 mg | ORAL_CAPSULE | Freq: Two times a day (BID) | ORAL | Status: DC

## 2011-08-13 ENCOUNTER — Telehealth: Payer: Self-pay | Admitting: Oncology

## 2011-08-13 NOTE — Telephone Encounter (Signed)
pt aware of 09/30/11  appt  aom °

## 2011-09-06 ENCOUNTER — Encounter: Payer: Self-pay | Admitting: *Deleted

## 2011-09-06 DIAGNOSIS — M25569 Pain in unspecified knee: Secondary | ICD-10-CM | POA: Insufficient documentation

## 2011-09-06 DIAGNOSIS — I10 Essential (primary) hypertension: Secondary | ICD-10-CM

## 2011-09-07 ENCOUNTER — Ambulatory Visit (INDEPENDENT_AMBULATORY_CARE_PROVIDER_SITE_OTHER): Payer: 59 | Admitting: Family Medicine

## 2011-09-07 ENCOUNTER — Encounter: Payer: Self-pay | Admitting: Family Medicine

## 2011-09-07 VITALS — BP 146/74 | HR 77 | Temp 96.9°F | Resp 16 | Ht 62.5 in | Wt 158.0 lb

## 2011-09-07 DIAGNOSIS — F329 Major depressive disorder, single episode, unspecified: Secondary | ICD-10-CM

## 2011-09-07 DIAGNOSIS — G4700 Insomnia, unspecified: Secondary | ICD-10-CM

## 2011-09-07 DIAGNOSIS — R209 Unspecified disturbances of skin sensation: Secondary | ICD-10-CM

## 2011-09-07 DIAGNOSIS — Z0000 Encounter for general adult medical examination without abnormal findings: Secondary | ICD-10-CM

## 2011-09-07 DIAGNOSIS — R202 Paresthesia of skin: Secondary | ICD-10-CM

## 2011-09-07 DIAGNOSIS — E663 Overweight: Secondary | ICD-10-CM

## 2011-09-07 DIAGNOSIS — F341 Dysthymic disorder: Secondary | ICD-10-CM

## 2011-09-07 MED ORDER — ALPRAZOLAM 1 MG PO TABS: ORAL_TABLET | ORAL | Status: DC

## 2011-09-07 NOTE — Patient Instructions (Signed)
Anxiety and Panic Attacks Your caregiver has informed you that you are having an anxiety or panic attack. There may be many forms of this. Most of the time these attacks come suddenly and without warning. They come at any time of day, including periods of sleep, and at any time of life. They may be strong and unexplained. Although panic attacks are very scary, they are physically harmless. Sometimes the cause of your anxiety is not known. Anxiety is a protective mechanism of the body in its fight or flight mechanism. Most of these perceived danger situations are actually nonphysical situations (such as anxiety over losing a job). CAUSES  The causes of an anxiety or panic attack are many. Panic attacks may occur in otherwise healthy people given a certain set of circumstances. There may be a genetic cause for panic attacks. Some medications may also have anxiety as a side effect. SYMPTOMS  Some of the most common feelings are:  Intense terror.   Dizziness, feeling faint.   Hot and cold flashes.   Fear of going crazy.   Feelings that nothing is real.   Sweating.   Shaking.   Chest pain or a fast heartbeat (palpitations).   Smothering, choking sensations.   Feelings of impending doom and that death is near.   Tingling of extremities, this may be from over-breathing.   Altered reality (derealization).   Being detached from yourself (depersonalization).  Several symptoms can be present to make up anxiety or panic attacks. DIAGNOSIS  The evaluation by your caregiver will depend on the type of symptoms you are experiencing. The diagnosis of anxiety or panic attack is made when no physical illness can be determined to be a cause of the symptoms. TREATMENT  Treatment to prevent anxiety and panic attacks may include:  Avoidance of circumstances that cause anxiety.   Reassurance and relaxation.   Regular exercise.   Relaxation therapies, such as yoga.   Psychotherapy with a  psychiatrist or therapist.   Avoidance of caffeine, alcohol and illegal drugs.   Prescribed medication.  SEEK IMMEDIATE MEDICAL CARE IF:   You experience panic attack symptoms that are different than your usual symptoms.   You have any worsening or concerning symptoms.  Document Released: 06/27/2005 Document Revised: 03/09/2011 Document Reviewed: 10/29/2009 [facility] Patient Information 2012 [place], [place]   .Insomnia Insomnia is frequent trouble falling and/or staying asleep. Insomnia can be a long term problem or a short term problem. Both are common. Insomnia can be a short term problem when the wakefulness is related to a certain stress or worry. Long term insomnia is often related to ongoing stress during waking hours and/or poor sleeping habits. Overtime, sleep deprivation itself can make the problem worse. Every little thing feels more severe because you are overtired and your ability to cope is decreased. CAUSES   Stress, anxiety, and depression.   Poor sleeping habits.   Distractions such as TV in the bedroom.   Naps close to bedtime.   Engaging in emotionally charged conversations before bed.   Technical reading before sleep.   Alcohol and other sedatives. They may make the problem worse. They can hurt normal sleep patterns and normal dream activity.   Stimulants such as caffeine for several hours prior to bedtime.   Pain syndromes and shortness of breath can cause insomnia.   Exercise late at night.   Changing time zones may cause sleeping problems (jet lag).  It is sometimes helpful to have someone observe your sleeping patterns. They  should look for periods of not breathing during the night (sleep apnea). They should also look to see how long those periods last. If you live alone or observers are uncertain, you can also be observed at a sleep clinic where your sleep patterns will be professionally monitored. Sleep apnea requires a checkup and treatment. Give your  caregivers your medical history. Give your caregivers observations your family has made about your sleep.  SYMPTOMS   Not feeling rested in the morning.   Anxiety and restlessness at bedtime.   Difficulty falling and staying asleep.  TREATMENT   Your caregiver may prescribe treatment for an underlying medical disorders. Your caregiver can give advice or help if you are using alcohol or other drugs for self-medication. Treatment of underlying problems will usually eliminate insomnia problems.   Medications can be prescribed for short time use. They are generally not recommended for lengthy use.   Over-the-counter sleep medicines are not recommended for lengthy use. They can be habit forming.   You can promote easier sleeping by making lifestyle changes such as:   Using relaxation techniques that help with breathing and reduce muscle tension.   Exercising earlier in the day.   Changing your diet and the time of your last meal. No night time snacks.   Establish a regular time to go to bed.   Counseling can help with stressful problems and worry.   Soothing music and white noise may be helpful if there are background noises you cannot remove.   Stop tedious detailed work at least one hour before bedtime.  HOME CARE INSTRUCTIONS   Keep a diary. Inform your caregiver about your progress. This includes any medication side effects. See your caregiver regularly. Take note of:   Times when you are asleep.   Times when you are awake during the night.   The quality of your sleep.   How you feel the next day.  This information will help your caregiver care for you.  Get out of bed if you are still awake after 15 minutes. Read or do some quiet activity. Keep the lights down. Wait until you feel sleepy and go back to bed.   Keep regular sleeping and waking hours. Avoid naps.   Exercise regularly.   Avoid distractions at bedtime. Distractions include watching television or engaging  in any intense or detailed activity like attempting to balance the household checkbook.   Develop a bedtime ritual. Keep a familiar routine of bathing, brushing your teeth, climbing into bed at the same time each night, listening to soothing music. Routines increase the success of falling to sleep faster.   Use relaxation techniques. This can be using breathing and muscle tension release routines. It can also include visualizing peaceful scenes. You can also help control troubling or intruding thoughts by keeping your mind occupied with boring or repetitive thoughts like the old concept of counting sheep. You can make it more creative like imagining planting one beautiful flower after another in your backyard garden.   During your day, work to eliminate stress. When this is not possible use some of the previous suggestions to help reduce the anxiety that accompanies stressful situations.  MAKE SURE YOU:   Understand these instructions.   Will watch your condition.   Will get help right away if you are not doing well or get worse.  Document Released: 06/24/2000 Document Revised: 03/09/2011 Document Reviewed: 07/25/2007 [facility] Patient Information 2012 [place], [place].

## 2011-09-08 ENCOUNTER — Encounter: Payer: Self-pay | Admitting: Family Medicine

## 2011-09-08 NOTE — Progress Notes (Signed)
Subjective:    Patient ID: [name] [name], female    DOB: 1956-07-02, 56 y.o.   MRN: [id number]  HPI  This 56 y.o Cauc female is here to re-establish care, last seen here in March 2004.  She was diagnosed with Rectosigmoid Adenocarcinoma in July 2004 followed by colon resection  with re-anastomosis and successfully completed Chemotherapy and Radiation Therapy. She continues  surveillance at [facility] with Dr. Arline Asp and has CRS as deemed necessary with  Dr. Juanda Chance. She also sees Dr. Donnie Aho re: "heart murmur". She also sees Dr. Althea Charon re: "arthritis".  Main issues today: Insomnia (chronic) and unresponsive to OTC products (Unisom, Melatonin); has  tried Ambien last year and this was ineffective as well. Neuropathy post-Chemo did not really respond  to Lyrica so pt discontinued this med also.   She works as an Marine scientist; is married and has 1 adult child; acknowledges that she  needs to lose weight- she enjoys water aerobics.  HCM: UTD  -  Mammogram- 2012 (normal), Colonoscopy- 2011 (normal) ,  PAP- 2010 (negative)    Review of Systems  HENT: Negative.   Eyes:       Dry eyes  (last exam 2011); wears glasses  Respiratory: Negative.   Cardiovascular: Negative.   Gastrointestinal: Positive for abdominal pain, diarrhea and constipation.       Thinks she may have IBS  Genitourinary: Negative for urgency, frequency, hematuria and difficulty urinating.       Nocturia  Musculoskeletal: Positive for arthralgias.  Neurological: Positive for weakness.       Paresthesias- fingers  Psychiatric/Behavioral: Positive for sleep disturbance.       Objective:   Physical Exam  Nursing note and vitals reviewed. Constitutional: She is oriented to person, place, and time. She appears well-developed and well-nourished. No distress.  HENT:  Head: Normocephalic and atraumatic.  Right Ear: External ear normal.  Left Ear: External ear normal.  Nose: Nose normal.    Mouth/Throat: Oropharynx is clear and moist.  Eyes: Conjunctivae and EOM are normal. Pupils are equal, round, and reactive to light. No scleral icterus.  Neck: Normal range of motion. Neck supple. No thyromegaly present.  Cardiovascular: Normal rate and regular rhythm.  Exam reveals no gallop and no friction rub.   Murmur heard.      Systolic ejection murmur :  3/6  Pulmonary/Chest: Effort normal and breath sounds normal. No respiratory distress. She has no wheezes. She has no rales.  Abdominal: Soft. She exhibits no distension and no mass. There is tenderness. There is no guarding.  Genitourinary: Guaiac negative stool.       Rectal exam: normal sphincter tone, no masses  Musculoskeletal: Normal range of motion. She exhibits no edema and no tenderness.  Neurological: She is alert and oriented to person, place, and time. She has normal reflexes. No cranial nerve deficit. She exhibits normal muscle tone.  Skin: Skin is warm and dry. No rash noted. No pallor.  Psychiatric: She has a normal mood and affect. Her behavior is normal. Judgment and thought content normal.          Assessment & Plan:   1. Routine general medical examination at a health care facility   2. Insomnia       Pt will try Sub-lingual Melatonin as well as Chamomile tea at bedtime;                        needs to resume regular exercise  to improve sleep hygiene  3. Anxiety and depression      Trial: Alprazolam 1 mg   1/2 to 1 tablet at bedtime prn.  4. Paresthesias   Chronic Neuropathy post Chemotherapy.  5. Overweight (BMI 25.0-29.9)

## 2011-09-23 ENCOUNTER — Telehealth: Payer: Self-pay | Admitting: Oncology

## 2011-09-23 NOTE — Telephone Encounter (Signed)
l/m that appt had been changed from dr m to sara    aom

## 2011-09-30 ENCOUNTER — Ambulatory Visit: Payer: BC Managed Care – PPO | Admitting: Oncology

## 2011-09-30 ENCOUNTER — Other Ambulatory Visit: Payer: BC Managed Care – PPO

## 2011-09-30 ENCOUNTER — Other Ambulatory Visit (HOSPITAL_BASED_OUTPATIENT_CLINIC_OR_DEPARTMENT_OTHER): Payer: 59 | Admitting: Lab

## 2011-09-30 ENCOUNTER — Telehealth: Payer: Self-pay | Admitting: Oncology

## 2011-09-30 ENCOUNTER — Ambulatory Visit (HOSPITAL_BASED_OUTPATIENT_CLINIC_OR_DEPARTMENT_OTHER): Payer: 59 | Admitting: Physician Assistant

## 2011-09-30 VITALS — BP 126/75 | HR 84 | Temp 98.2°F | Ht 62.5 in | Wt 157.8 lb

## 2011-09-30 DIAGNOSIS — C20 Malignant neoplasm of rectum: Secondary | ICD-10-CM

## 2011-09-30 DIAGNOSIS — Z85048 Personal history of other malignant neoplasm of rectum, rectosigmoid junction, and anus: Secondary | ICD-10-CM

## 2011-09-30 DIAGNOSIS — Z85038 Personal history of other malignant neoplasm of large intestine: Secondary | ICD-10-CM

## 2011-09-30 LAB — CBC WITH DIFFERENTIAL/PLATELET
Basophils Absolute: 0 10*3/uL (ref 0.0–0.1)
Eosinophils Absolute: 0.1 10*3/uL (ref 0.0–0.5)
HCT: 39.6 % (ref 34.8–46.6)
HGB: 14 g/dL (ref 11.6–15.9)
LYMPH%: 23.2 % (ref 14.0–49.7)
MCV: 88.7 fL (ref 79.5–101.0)
MONO%: 7.9 % (ref 0.0–14.0)
NEUT#: 3.2 10*3/uL (ref 1.5–6.5)
Platelets: 166 10*3/uL (ref 145–400)

## 2011-09-30 LAB — COMPREHENSIVE METABOLIC PANEL
Albumin: 4.5 g/dL (ref 3.5–5.2)
Alkaline Phosphatase: 48 U/L (ref 39–117)
BUN: 15 mg/dL (ref 6–23)
Glucose, Bld: 115 mg/dL — ABNORMAL HIGH (ref 70–99)
Potassium: 4.5 mEq/L (ref 3.5–5.3)
Total Bilirubin: 0.4 mg/dL (ref 0.3–1.2)

## 2011-09-30 NOTE — Progress Notes (Signed)
[facility] OFFICE PROGRESS NOTE  No primary provider on file.   Interim History:  [name] [name] was seen today for followup of her rectosigmoid adenocarcinoma diagnosed in July 2004.  History of [name] [name]'s treatments can be found in previous notes.  [name] [name] was last seen by [name] on 04/04/2011.  She has no symptoms to suggest recurrent colorectal cancer, specifically any sense of ill health, blood in the stools or change of bowel habit.  Her last CEA was <0.5. Her diarrhea  Has improved, with stools being more formed now. Intermittently she develops constipation felt to be most likely due to irritable bowel syndrome.  [name] [name] is going to make an appointment with Dr. Juanda Chance.  Her last colonoscopy was on July 16, 2009.  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 She has decided to discontinue the use of Lyrica, since no significant improvement has been noted by her with the medication, and has expressed no desire to increase her dose. She is being evaluated by her PCP, Dr. Lenoria Chime for possible Vitamin D deficiency.  [name] [name]'s last chest x-ray was about a year and a half ago.  She had a mammogram on 01/31/2011 that was negative and is supposed to have yearly followup on that. She has had an echocardiogram by Dr. Donnie Aho for evaluation of murmur, which according to her had no worrisome results. Will request a copy of that report.  All things considered, she seems to be doing fairly well.  She remains active, and is waiting for warmer weather to perform more outdoor activities. 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    MEDICAL HISTORY: Past Medical History  Diagnosis Date  . Essential hypertension, benign   . Knee pain   . Ganglion cyst   . Heart murmur   . Rectosigmoid cancer 2004     S/P Surgery, Chemo, Rad Tx    SURGICAL HISTORY:  Past Surgical History  Procedure Date  . Spine surgery Feb 2007  . Colon surgery Aug 2004      S/P Low Anterior Resxn (Sigmoid Colon)    MEDICATIONS: Current Outpatient Prescriptions  Medication Sig Dispense Refill  . ALPRAZolam (XANAX) 1 MG tablet Take 1/2 to  1 tablet by mouth at bedtime as needed for sleep.  20 tablet  2  . lisinopril-hydrochlorothiazide (PRINZIDE,ZESTORETIC) 10-12.5 MG per tablet Take 1 tablet by mouth daily.      [name] lisinopril-hydrochlorothiazide (PRINZIDE,ZESTORETIC) 20-12.5 MG per tablet Take 2 tablets by mouth daily.      . meloxicam (MOBIC) 15 MG tablet Take 15 mg by mouth daily.      . pravastatin (PRAVACHOL) 80 MG tablet Take 80 mg by mouth daily.      . pregabalin (LYRICA) 75 MG capsule Take 1 capsule (75 mg total) by mouth 2 (two) times daily.  60 capsule  3    ALLERGIES:   has no known allergies.  REVIEW OF SYSTEMS:  The rest of the 14-point review of system was negative.   Filed Vitals:   09/30/11 0933  BP: 126/75  Pulse: 84  Temp: 98.2 F (36.8 C)   Wt Readings from Last 3 Encounters:  09/30/11 157 lb 12.8 oz (71.578 kg)  09/07/11 158 lb (71.668 kg)  05/01/09 168 lb 6.1 oz (76.377 kg)     PHYSICAL EXAMINATION:   [name] [name] looks well.  There is no obvious change.  There is no scleral icterus.  Mouth and pharynx benign.  No peripheral adenopathy  palpable.  Lungs:  Clear to percussion and auscultation.  No musculoskeletal tenderness.  Cardiac:  Regular rhythm with soft  1/6 systolic ejection murmur.  Breasts:  Not examined.  As stated, the patient had mammograms on January 31, 2011.  Abdomen:  Benign with no organomegaly or masses palpable.  Extremities: no edema. Neuro: non focal  LABORATORY/RADIOLOGY DATA:   Lab 09/30/11 0903  WBC 4.8  HGB 14.0  HCT 39.6  PLT 166  MCV 88.7  MCH 31.3  MCHC 35.3  RDW 11.7  LYMPHSABS 1.1  MONOABS 0.4  EOSABS 0.1  BASOSABS 0.0  BANDABS --    CMP   No results found for this basename: NA:5,K:5,CL:5,CO2:5,GLUCOSE:5,BUN:5,CREATININE:5,GFRCGP,:5,CALCIUM:5,MG:5,AST:5,ALT:5,ALKPHOS:5,BILITOT:5 in the  last 168 hours      Component Value Date/Time   BILITOT 0.3 04/04/2011 1600   BILITOT 0.3 04/04/2011 1600     Radiology Studies:  No results found.     ASSESSMENT AND PLAN:   Libbey continues to do well, now close to 9  years from the time of diagnosis.  We will plan to see [name] [name] again in 6 months, at which time she can see our Mid Level practitioner.  We will check CBC and chemistries.  At some point we will see [name] [name] on an annual basis. Other medical issues as per the respective physicians care. She has been instructed to request records to be faxed or mailed to [name] for review.  In the interim, she knows to call us if she has any questions or concerns.

## 2011-09-30 NOTE — Telephone Encounter (Signed)
gv pt appt schedule for sept °

## 2011-11-03 ENCOUNTER — Ambulatory Visit: Payer: 59 | Admitting: Family Medicine

## 2011-11-24 ENCOUNTER — Encounter: Payer: Self-pay | Admitting: Family Medicine

## 2011-11-24 ENCOUNTER — Ambulatory Visit (INDEPENDENT_AMBULATORY_CARE_PROVIDER_SITE_OTHER): Payer: 59 | Admitting: Family Medicine

## 2011-11-24 VITALS — BP 132/72 | HR 80 | Temp 97.1°F | Resp 17 | Ht 62.5 in | Wt 157.4 lb

## 2011-11-24 DIAGNOSIS — G4700 Insomnia, unspecified: Secondary | ICD-10-CM

## 2011-11-24 DIAGNOSIS — F411 Generalized anxiety disorder: Secondary | ICD-10-CM

## 2011-11-24 DIAGNOSIS — F419 Anxiety disorder, unspecified: Secondary | ICD-10-CM

## 2011-11-24 MED ORDER — CITALOPRAM HYDROBROMIDE 20 MG PO TABS: ORAL_TABLET | ORAL | Status: DC

## 2011-11-24 NOTE — Progress Notes (Signed)
  Subjective:    Patient ID: [name] [name], female    DOB: 25-Apr-1956, 56 y.o.   MRN: [id number]  HPI   This Cauc female has chronic anxiety controlled fairly well on Alprazolam. This med helps her  relax so she can fall asleep Sleep disorder is persistent and she awakens every night several times.  She has family stressors (son has substance abuse problem); her father is coming from [place] for   a 3-week stay but she is looking forward to this. She states she has always been unable to wind down and is  "high-strung". She denies excess caffeine and has no hx of thyroid problems. She is in cancer surveillance  for colon cancer and is doing well.    Review of Systems As per HPI; otherwise, noncontributory    Objective:   Physical Exam  Nursing note and vitals reviewed. Constitutional: She is oriented to person, place, and time. She appears well-developed and well-nourished. No distress.  HENT:  Head: Normocephalic and atraumatic.  Eyes: Conjunctivae and EOM are normal. No scleral icterus.  Cardiovascular: Normal rate.   Pulmonary/Chest: No respiratory distress.  Musculoskeletal: Normal range of motion.  Neurological: She is alert and oriented to person, place, and time. No cranial nerve deficit.  Skin: Skin is warm and dry.  Psychiatric: She has a normal mood and affect. Her behavior is normal. Judgment and thought content normal.   ZUNG Self-Rating Anxiety Scale (see scanned document in media)       Assessment & Plan:   1. Insomnia     Continue Alprazolam as needed.  2. Anxiety disorder   RX: Citalopram 20 mg  1/2 tab 1st week or two then increase to 1 tablet every AM

## 2011-11-24 NOTE — Patient Instructions (Signed)

## 2011-11-28 ENCOUNTER — Telehealth: Payer: Self-pay

## 2011-11-28 NOTE — Telephone Encounter (Signed)
Pt sees dr Althea Charon at [facility] who prescribes her arthritis meds meloxicam 15 mg once daily,she cant get into see him until next Thursday,so they advised her to call us for refill?  Best phone [phone]   [facility] [address]

## 2011-11-29 MED ORDER — MELOXICAM 15 MG PO TABS: 15.0000 mg | ORAL_TABLET | Freq: Every day | ORAL | Status: DC

## 2011-11-29 NOTE — Telephone Encounter (Signed)
Left message RX sent into pharmacy any questions call [phone]

## 2011-11-29 NOTE — Telephone Encounter (Signed)
Done and sent in 

## 2011-12-27 ENCOUNTER — Other Ambulatory Visit: Payer: Self-pay | Admitting: Physician Assistant

## 2012-01-02 ENCOUNTER — Other Ambulatory Visit: Payer: Self-pay | Admitting: *Deleted

## 2012-01-02 MED ORDER — ALPRAZOLAM 1 MG PO TABS: ORAL_TABLET | ORAL | Status: DC

## 2012-01-02 NOTE — Telephone Encounter (Signed)
Rx done and ready to be faxed to pharmacy 

## 2012-01-02 NOTE — Telephone Encounter (Signed)
Pt stated that pharmacy sent over a request but has not heard back.  The rx is for her Xanax

## 2012-01-03 NOTE — Telephone Encounter (Signed)
Called in Rx to [facility] and notified pt

## 2012-01-10 ENCOUNTER — Other Ambulatory Visit: Payer: Self-pay | Admitting: Cardiology

## 2012-01-15 ENCOUNTER — Other Ambulatory Visit: Payer: Self-pay | Admitting: Cardiology

## 2012-01-25 ENCOUNTER — Ambulatory Visit (INDEPENDENT_AMBULATORY_CARE_PROVIDER_SITE_OTHER): Payer: 59 | Admitting: Family Medicine

## 2012-01-25 ENCOUNTER — Encounter: Payer: Self-pay | Admitting: Family Medicine

## 2012-01-25 VITALS — BP 152/78 | HR 79 | Temp 98.8°F | Resp 16 | Ht 62.75 in | Wt 155.2 lb

## 2012-01-25 DIAGNOSIS — I10 Essential (primary) hypertension: Secondary | ICD-10-CM

## 2012-01-25 DIAGNOSIS — F419 Anxiety disorder, unspecified: Secondary | ICD-10-CM

## 2012-01-25 DIAGNOSIS — F411 Generalized anxiety disorder: Secondary | ICD-10-CM

## 2012-01-25 DIAGNOSIS — G4700 Insomnia, unspecified: Secondary | ICD-10-CM

## 2012-01-25 LAB — BASIC METABOLIC PANEL
BUN: 14 mg/dL (ref 6–23)
CO2: 25 mEq/L (ref 19–32)
Calcium: 9.7 mg/dL (ref 8.4–10.5)
Creat: 0.65 mg/dL (ref 0.50–1.10)
Glucose, Bld: 114 mg/dL — ABNORMAL HIGH (ref 70–99)

## 2012-01-25 MED ORDER — ALPRAZOLAM 1 MG PO TABS: ORAL_TABLET | ORAL | Status: DC

## 2012-01-26 NOTE — Progress Notes (Signed)
Quick Note:  Please call pt and advise that the following labs are abnormal... Blood sugar is a elevated. Your could have some insulin resistance; looking back through your lab results, it looks like you have had above normal blood sugar for years. Try to limit concentrated sweets in your diet as well as starchy foods and fried foods. Avoid sugar drinks and drink mostly water. Eat more fruits and vegetables and whole grains- try the "Mediterranean Diet" which you can find information about online.  We will check your blood sugar at your next visit as well as do a test to check for Diabetes. Take care!  Copy to pt. ______

## 2012-01-30 MED ORDER — CITALOPRAM HYDROBROMIDE 20 MG PO TABS: ORAL_TABLET | ORAL | Status: DC

## 2012-01-30 NOTE — Progress Notes (Signed)
S:  This 56 y.o. Cauc female returns for HTN follow-up and medication review; current meds for insomnia and anxiety are working well. No adverse effects with anti-hypertensive. Denies CP, palpitations, SOB. edema, cough, HA, dizziness or             weakness.  O: Vital signs reviewed - BP elevated today      In NAD; WN, WD      HENT: [place]/AT; EOMI, conj/sclerae clear      COR: RRR      LUNGS: Normal resp rate and effort      NEURO: Nonfocal   A:P 1. HTN (hypertension) - previous reading show control on current medication Basic metabolic panel RF: Lisinopril- HCTZ 20-12.5 mg  1 tablet daily Improve nutrition to help with BP reduction  2. Insomnia  Continue Alprazolam 1 mg  1/2 -1 tablet at bedtime prn sleep  3. Anxiety disorder  Continue Citalopram 20 mg  1 tab every AM. Regular exercise will improve sleep hygiene and reduce stress.

## 2012-04-02 ENCOUNTER — Ambulatory Visit (HOSPITAL_BASED_OUTPATIENT_CLINIC_OR_DEPARTMENT_OTHER): Payer: 59 | Admitting: Oncology

## 2012-04-02 ENCOUNTER — Telehealth: Payer: Self-pay | Admitting: Oncology

## 2012-04-02 ENCOUNTER — Other Ambulatory Visit (HOSPITAL_BASED_OUTPATIENT_CLINIC_OR_DEPARTMENT_OTHER): Payer: 59 | Admitting: Lab

## 2012-04-02 ENCOUNTER — Encounter: Payer: Self-pay | Admitting: Oncology

## 2012-04-02 VITALS — BP 149/80 | HR 90 | Temp 98.2°F | Resp 20 | Ht 62.75 in | Wt 156.5 lb

## 2012-04-02 DIAGNOSIS — Z85048 Personal history of other malignant neoplasm of rectum, rectosigmoid junction, and anus: Secondary | ICD-10-CM

## 2012-04-02 DIAGNOSIS — Z85038 Personal history of other malignant neoplasm of large intestine: Secondary | ICD-10-CM

## 2012-04-02 LAB — CBC WITH DIFFERENTIAL/PLATELET
BASO%: 0.7 % (ref 0.0–2.0)
EOS%: 0.7 % (ref 0.0–7.0)
HCT: 36.8 % (ref 34.8–46.6)
MCH: 31.9 pg (ref 25.1–34.0)
MCHC: 34.9 g/dL (ref 31.5–36.0)
NEUT%: 71.2 % (ref 38.4–76.8)
RBC: 4.02 10*6/uL (ref 3.70–5.45)
RDW: 11.9 % (ref 11.2–14.5)
lymph#: 0.9 10*3/uL (ref 0.9–3.3)

## 2012-04-02 LAB — COMPREHENSIVE METABOLIC PANEL (CC13)
ALT: 32 U/L (ref 0–55)
BUN: 16 mg/dL (ref 7.0–26.0)
CO2: 25 mEq/L (ref 22–29)
Calcium: 9.6 mg/dL (ref 8.4–10.4)
Creatinine: 0.8 mg/dL (ref 0.6–1.1)
Glucose: 127 mg/dl — ABNORMAL HIGH (ref 70–99)
Total Bilirubin: 0.5 mg/dL (ref 0.20–1.20)

## 2012-04-02 LAB — LACTATE DEHYDROGENASE (CC13): LDH: 138 U/L (ref 125–220)

## 2012-04-02 NOTE — Progress Notes (Signed)
This office note has been dictated.  #[id number]

## 2012-04-02 NOTE — Progress Notes (Signed)
CC:   [name] [name]. Hoxworth, M.D. [name] [name], M.D. [name] [name]. [name] Chance, MD [facility]. [name] [name], M.D.  PROBLEM LIST: 1. Adenocarcinoma of the rectosigmoid area, moderately differentiated     T3 N0, stage II with negative margins and no lymphovascular     Invasion, diagnosed in July 2004.  The patient underwent a low anterior         resection on 03/10/2003.  All 10 lymph nodes were negative.  The patient     underwent treatment consisting of 5-FU by continuous infusion along     with pelvic radiation 5040 cGy in 28 fractions which concluded in     late December 2004.  The patient received initial chemotherapy     preceding these treatments and then treatment with FOLFOX until     June of 2005.  She remains disease free. 2. Irritable bowel syndrome. 3. Hypertension. 4. Systolic ejection murmur. 5. Diverticulosis. 6. History of colonic polyps. 7. Sensory peripheral neuropathy secondary to oxaliplatin. 8. Degenerative arthritis. 9. Status post surgery for a herniated disk February 2007. 10.Dyslipidemia.  MEDICATIONS: 1. Xanax 0.5-1 mg at bedtime as needed for sleep. 2. Lisinopril/hydrochlorothiazide 20/12.5 mg 1 twice daily. 3. Mobic 15 mg daily. 4. Pravachol 80 mg daily.  SMOKING HISTORY:  The patient is a former smoker.  HISTORY:  I saw [name] [name] today for followup of her rectosigmoid adenocarcinoma diagnosed in July 2004.  [name] [name] was last seen by [name] on 09/30/2011 and prior to that on 04/04/2011.  In general, she is doing well.  She has lost some weight over the past year, which was a conscious effort.  Her sensory neuropathy is much improved.  She is no longer taking any Lyrica.  Unfortunately, she continues to have alternating diarrhea and constipation, felt to be due to irritable bowel syndrome.  In looking through our notes her last colonoscopy was on 07/16/2009.  I have asked [name] [name] to check with Dr. Lina [name] regarding her next colonoscopy.  Apparently she has had  colonic polyps.  She denies any evidence of bleeding, rectal symptomatology or any symptoms to suggest recurrent colon cancer.  In general, she feels well.  She is now out over 9 years from the time of diagnosis.  PHYSICAL EXAM:  The patient looks well.  Weight is 156.5 pounds, height 5 feet 2-3/4 inches, body surface area 1.77 m2.  Blood pressure 149/80. Other vital signs are normal.  There is no scleral icterus.  Mouth and pharynx are benign.  No peripheral adenopathy palpable.  Lungs are clear to percussion and auscultation.  Cardiac exam regular rhythm with systolic ejection murmur.  Breasts were not examined.  The patient had mammograms carried out within the past couple of months.  Abdomen is benign with no organomegaly or masses palpable.  I thought I might have felt liver edge descending with inspiration just below the right costal margin but no ascites or masses.  The patient has bilateral puffy ankles, left greater than right.  Neurologic exam is normal.  LABORATORY DATA:  Today, white count 4.8, ANC 3.4, hemoglobin 12.8, hematocrit 36.8, platelets 162,000.  Red cell indices were normal. Chemistries were notable for a glucose of 127, otherwise normal.  CEA today is pending.  CEA on 04/04/2011 was less than 0.5.  IMAGING STUDIES:  1. CT scans of chest, abdomen and pelvis from 04/17/2007 showed no evidence for metastatic disease.  There were some post surgical changes in the rectum.  There were left hip degenerative changes and numerous  mild thoracic disk bulges. 2. Chest x-ray, 2 view, from 03/26/2010 was without acute cardiopulmonary findings.  IMPRESSION AND PLAN:  [name] [name] continues to do well now, over 9 years from the time of diagnosis.  I would like to get a chest x-ray on her this week.  Her last chest x-ray was 2 years ago.  We will plan to see [name] [name] again in 1 year at which time we will check CBC and chemistries.  I recommended that [name] [name] call Dr. Lina [name]  regarding her next colonoscopy.  Her last colonoscopy was on 07/16/2009.  The patient says her neuropathy is improved.  She is no longer on Lyrica.  She will obtain a flu shot.    ______________________________ Samul Dada, M.D. DSM/MEDQ  D:  04/02/2012  T:  04/02/2012  Job:  [id number]

## 2012-04-02 NOTE — Telephone Encounter (Signed)
gv pt appt schedule and pt will have cxr done sometime this week or next Monday as she did not have time today.

## 2012-04-02 NOTE — Patient Instructions (Addendum)
Call Dr. Juanda Chance regarding next colonoscopy.  Last colonoscopy was 08/05/2009.

## 2012-04-03 LAB — CEA: CEA: 1 ng/mL (ref 0.0–5.0)

## 2012-04-17 ENCOUNTER — Ambulatory Visit (HOSPITAL_COMMUNITY)
Admission: RE | Admit: 2012-04-17 | Discharge: 2012-04-17 | Disposition: A | Discharge status: Home / Self Care | Payer: 59 | Source: Ambulatory Visit | Attending: Oncology | Admitting: Oncology

## 2012-04-17 DIAGNOSIS — C189 Malignant neoplasm of colon, unspecified: Secondary | ICD-10-CM | POA: Insufficient documentation

## 2012-04-17 DIAGNOSIS — Z85038 Personal history of other malignant neoplasm of large intestine: Secondary | ICD-10-CM

## 2012-04-17 NOTE — Progress Notes (Signed)
Quick Note:  Please notify patient and call/fax these results to patient's doctors. ______ 

## 2012-04-18 ENCOUNTER — Telehealth: Payer: Self-pay | Admitting: Medical Oncology

## 2012-04-18 NOTE — Telephone Encounter (Signed)
I called pt and left her a message with her chest x-ray results

## 2012-05-08 ENCOUNTER — Other Ambulatory Visit: Payer: Self-pay | Admitting: Family Medicine

## 2012-06-25 ENCOUNTER — Other Ambulatory Visit: Payer: Self-pay | Admitting: Physician Assistant

## 2012-08-09 ENCOUNTER — Encounter: Payer: Self-pay | Admitting: Family Medicine

## 2012-08-09 ENCOUNTER — Ambulatory Visit (INDEPENDENT_AMBULATORY_CARE_PROVIDER_SITE_OTHER): Payer: 59 | Admitting: Family Medicine

## 2012-08-09 VITALS — BP 154/80 | HR 95 | Temp 97.5°F | Resp 16 | Ht 62.75 in | Wt 161.0 lb

## 2012-08-09 DIAGNOSIS — Z760 Encounter for issue of repeat prescription: Secondary | ICD-10-CM

## 2012-08-09 DIAGNOSIS — F411 Generalized anxiety disorder: Secondary | ICD-10-CM

## 2012-08-09 DIAGNOSIS — F419 Anxiety disorder, unspecified: Secondary | ICD-10-CM

## 2012-08-09 DIAGNOSIS — M2550 Pain in unspecified joint: Secondary | ICD-10-CM

## 2012-08-09 DIAGNOSIS — Z6379 Other stressful life events affecting family and household: Secondary | ICD-10-CM

## 2012-08-09 MED ORDER — ALPRAZOLAM 1 MG PO TABS: ORAL_TABLET | ORAL | Status: DC

## 2012-08-09 MED ORDER — MELOXICAM 15 MG PO TABS: 15.0000 mg | ORAL_TABLET | Freq: Every day | ORAL | Status: DC

## 2012-08-09 NOTE — Progress Notes (Signed)
S: This [name] was seen 6 months ago and prescribed Citalopram for depression w/ anxiety, due to 31 y.o. Son's heroin addiction and ongoing efforts at rehab. Pt and husband have invested alot financially to try to get their son to recovery; he has been in treatment before and relapsed. Part of his treatment now includes a once-a -month injection, given by an Addiction Specialist located in [place]; it is expensive and pt /husband are paying out-of-pocket for treatment. Pt never took Citalopram prescribed 6 months ago (reluctant to use medications of this sort); she does take Alprazolam as needed for sleep.   She also has chronic hand pain which she attributes to hours of work on the computer. Meloxicam was prescribed by [facility] but pt advised to have this problem managed by PCP. She tried Aleve w/ some relief but reports Meloxicam is more effective. She takes NSAID w/ food and denies any GI problems. She has other mild joint discomfort which she thinks is related to chemotherapy received for treatment of caner; she does have regular follow-up with Dr. Arline Asp.  ROS: As per HPI; negative for unexplained weight loss, diaphoresis, fatigue, CP or tightness, palpitations, SOB or DOE, n/v, abd pain, rash or pallor, significant sleep disturbance, suicide ideation, agitation or behavior changes.  O:  Filed Vitals:   08/09/12 1146  BP: 154/80  Pulse: 95  Temp: 97.5 F (36.4 C)  Resp: 16   GEN: In NAD; WN/WD. HENT: [place]/AT; EOMI w/ clear  Conj/ sclerae. Oroph clear and moist. COR: RRR. LUNGS: Normal resp rate and effort. MS: Hands- mild degenerative changes in DIP and PIP joints; no swelling or erythema. SKIN: W&D; no rashes. NEURO: A&O x 3; CNs intact. Nonfocal.  A/P: 1. Anxiety disorder   Continue Alprazolam; pt to start Citalopram 20 mg 1/2 tablet every AM until return visit; will reassess at that time.  2. Joint pain   RF: Meloxicam  1 tablet daily w/ food.  3. Medication refill    4. Stress due to illness of family member      Encouraged pt with positive discussion and support for son's recovery.

## 2012-08-31 ENCOUNTER — Encounter: Payer: 59 | Admitting: Family Medicine

## 2012-09-13 ENCOUNTER — Encounter: Payer: 59 | Admitting: Family Medicine

## 2012-10-05 ENCOUNTER — Ambulatory Visit (INDEPENDENT_AMBULATORY_CARE_PROVIDER_SITE_OTHER): Payer: 59 | Admitting: Family Medicine

## 2012-10-05 ENCOUNTER — Encounter: Payer: Self-pay | Admitting: Family Medicine

## 2012-10-05 VITALS — BP 123/79 | HR 86 | Temp 97.0°F | Resp 16 | Ht 62.0 in | Wt 161.0 lb

## 2012-10-05 DIAGNOSIS — Z0000 Encounter for general adult medical examination without abnormal findings: Secondary | ICD-10-CM

## 2012-10-05 DIAGNOSIS — G609 Hereditary and idiopathic neuropathy, unspecified: Secondary | ICD-10-CM

## 2012-10-05 DIAGNOSIS — F5104 Psychophysiologic insomnia: Secondary | ICD-10-CM

## 2012-10-05 DIAGNOSIS — Z23 Encounter for immunization: Secondary | ICD-10-CM

## 2012-10-05 DIAGNOSIS — G4700 Insomnia, unspecified: Secondary | ICD-10-CM

## 2012-10-05 LAB — POCT URINALYSIS DIPSTICK
Protein, UA: NEGATIVE
Spec Grav, UA: 1.025
Urobilinogen, UA: 0.2

## 2012-10-05 NOTE — Progress Notes (Signed)
Subjective:    Patient ID: [name] [name], female    DOB: 1956/05/12, 57 y.o.   MRN: [id number]  HPI  This [name] is here for annual CPE; she has colon cancer surveillance with GI-  Dr. Julio Alm- as well as Oncology- Dr. Arline Asp. Main complaints are related to insomnia- pt  can get to sleep w/ Alprazolam but awakens several times throughout the night. She reports vivid  dreams and chronic anxiety. She has started the Citalopram as directed at last visit (medication  is helpful). Her son who has substance abuse issues continues in treatment in a transitional   living facility.    Review of Systems  Constitutional: Positive for fatigue.  HENT: Negative.   Eyes: Negative.   Respiratory: Negative.   Cardiovascular: Negative.   Gastrointestinal: Positive for diarrhea and constipation.  Endocrine: Negative.   Genitourinary: Negative.   Musculoskeletal: Positive for back pain, arthralgias and gait problem.       Hip  Skin: Negative.   Allergic/Immunologic: Negative.   Neurological: Positive for numbness.       Pt has tingling/ chronic neuropathy related to prior cancer treatment.  Hematological: Negative.   Psychiatric/Behavioral: Positive for sleep disturbance.       Objective:   Physical Exam  Constitutional: She is oriented to person, place, and time. She appears well-developed and well-nourished. No distress.  HENT:  Head: Normocephalic and atraumatic.  Right Ear: Hearing, external ear and ear canal normal. Tympanic membrane is scarred. Tympanic membrane is not injected, not erythematous and not retracted. No middle ear effusion.  Left Ear: Hearing, external ear and ear canal normal. Tympanic membrane is scarred. Tympanic membrane is not injected, not erythematous and not retracted.  No middle ear effusion.  Nose: Nose normal. No mucosal edema, nasal deformity or septal deviation. Right sinus exhibits no maxillary sinus tenderness and no frontal sinus tenderness.  Left sinus exhibits no maxillary sinus tenderness and no frontal sinus tenderness.  Mouth/Throat: Uvula is midline, oropharynx is clear and moist and mucous membranes are normal. No oral lesions. Normal dentition. No dental caries.  Eyes: Conjunctivae, EOM and lids are normal. Pupils are equal, round, and reactive to light. No scleral icterus.  Fundoscopic exam:      The right eye shows arteriolar narrowing. The right eye shows no exudate, no hemorrhage and no papilledema. The right eye shows red reflex.       The left eye shows arteriolar narrowing. The left eye shows no exudate, no hemorrhage and no papilledema. The left eye shows red reflex.  Neck: Normal range of motion. Neck supple. No thyromegaly present.  Cardiovascular: Normal rate, regular rhythm, normal heart sounds and intact distal pulses.  Exam reveals no gallop and no friction rub.   No murmur heard. Pulmonary/Chest: Effort normal and breath sounds normal. No respiratory distress. Right breast exhibits no inverted nipple, no mass, no nipple discharge, no skin change and no tenderness. Left breast exhibits no inverted nipple, no mass, no nipple discharge and no skin change. Breasts are symmetrical.  Abdominal: Soft. Normal appearance and normal aorta. She exhibits no distension, no pulsatile midline mass and no mass. Bowel sounds are decreased. There is no hepatosplenomegaly. There is no tenderness. There is no guarding and no CVA tenderness. No hernia.  Genitourinary: Rectum normal. There is no rash or tenderness on the right labia. There is no rash or tenderness on the left labia. Right adnexum displays no mass and no tenderness. Left adnexum displays no  mass and no tenderness.  NEFG: bimanual exam only- vaginal canal stenosis anterior to cervix (unable to palpate cervix w/ fingertip). No hemosure done- pt has colon cancer surveillance with Dr. Dickie La.  Musculoskeletal: She exhibits tenderness. She exhibits no edema.       Right ankle:  She exhibits decreased range of motion and deformity. She exhibits no swelling and no ecchymosis. Tenderness. Lateral malleolus and medial malleolus tenderness found.       Left ankle: She exhibits decreased range of motion and deformity. She exhibits no swelling and no ecchymosis. Tenderness. Lateral malleolus and medial malleolus tenderness found.  Antalgic gait; L lateral hip tender w/ palpation.  Lymphadenopathy:    She has no cervical adenopathy.  Neurological: She is alert and oriented to person, place, and time. She has normal reflexes. No cranial nerve deficit. She exhibits normal muscle tone. Coordination normal.  Skin: Skin is warm and dry. No erythema. No pallor.  Well healed mid back scar in low-thoracic/upper lumbar region.  Psychiatric: She has a normal mood and affect. Her behavior is normal. Judgment and thought content normal.     Results for orders placed in visit on 10/05/12  POCT URINALYSIS DIPSTICK      Result Value Range   Color, UA yellow     Clarity, UA clear     Glucose, UA neg     Bilirubin, UA neg     Ketones, UA neg     Spec Grav, UA 1.025     Blood, UA neg     pH, UA 5.5     Protein, UA neg     Urobilinogen, UA 0.2     Nitrite, UA neg     Leukocytes, UA Negative         Assessment & Plan:  Routine general medical examination at a health care facility - Plan: POCT urinalysis dipstick  Unspecified hereditary and idiopathic peripheral neuropathy- pt has thoracic and lumbar DDD.  Chronic insomnia- continue current medications; discussed non-pharmacologic strategies to help improve sleep hygiene.  Need for prophylactic vaccination with combined diphtheria-tetanus-pertussis (DTP) vaccine - Plan: Tdap vaccine greater than or equal to 7yo IM

## 2012-10-05 NOTE — Patient Instructions (Addendum)
Keeping You Healthy  Get These Tests  Blood Pressure- Have your blood pressure checked by your healthcare provider at least once a year.  Normal blood pressure is 120/80.  Weight- Have your body mass index (BMI) calculated to screen for obesity.  BMI is a measure of body fat based on height and weight.  You can calculate your own BMI at www.nhlbisupport.com/bmi/  Cholesterol- Have your cholesterol checked every year.  Diabetes- Have your blood sugar checked every year if you have high blood pressure, high cholesterol, a family history of diabetes or if you are overweight.  Pap Smear- Have a pap smear every 1 to 3 years if you have been sexually active.  If you are older than 65 and recent pap smears have been normal you may not need additional pap smears.  In addition, if you have had a hysterectomy  For benign disease additional pap smears are not necessary.  Mammogram-Yearly mammograms are essential for early detection of breast cancer  Screening for Colon Cancer- Colonoscopy starting at age 50. Screening may begin sooner depending on your family history and other health conditions.  Follow up colonoscopy as directed by your Gastroenterologist.  Screening for Osteoporosis- Screening begins at age 65 with bone density scanning, sooner if you are at higher risk for developing Osteoporosis.  Get these medicines  Calcium with Vitamin D- Your body requires 1200-1500 mg of Calcium a day and 800-1000 IU of Vitamin D a day.  You can only absorb 500 mg of Calcium at a time therefore Calcium must be taken in 2 or 3 separate doses throughout the day.  Hormones- Hormone therapy has been associated with increased risk for certain cancers and heart disease.  Talk to your healthcare provider about if you need relief from menopausal symptoms.  Aspirin- Ask your healthcare provider about taking Aspirin to prevent Heart Disease and Stroke.  Get these Immuniztions  Flu shot- Every fall  Pneumonia  shot- Once after the age of 56; if you are younger ask your healthcare provider if you need a pneumonia shot.  Tetanus- Every ten years.  Zostavax- Once after the age of 56 to prevent shingles.  Take these steps  Don't smoke- Your healthcare provider can help you quit. For tips on how to quit, ask your healthcare provider or go to www.smokefree.gov or call 1-800 QUIT-NOW.  Be physically active- Exercise 5 days a week for a minimum of 30 minutes.  If you are not already physically active, start slow and gradually work up to 30 minutes of moderate physical activity.  Try walking, dancing, bike riding, swimming, etc.  Eat a healthy diet- Eat a variety of healthy foods such as fruits, vegetables, whole grains, low fat milk, low fat cheeses, yogurt, lean meats, chicken, fish, eggs, dried beans, tofu, etc.  For more information go to www.thenutritionsource.org  Dental visit- Brush and floss teeth twice daily; visit your dentist twice a year.  Eye exam- Visit your Optometrist or Ophthalmologist yearly.  Drink alcohol in moderation- Limit alcohol intake to one drink or less a day.  Never drink and drive.  Depression- Your emotional health is as important as your physical health.  If you're feeling down or losing interest in things you normally enjoy, please talk to your healthcare provider.  Seat Belts- can save your life; always wear one  Smoke/Carbon Monoxide detectors- These detectors need to be installed on the appropriate level of your home.  Replace batteries at least once a year.  Violence- If anyone   is threatening or hurting you, please tell your healthcare provider.  Living Will/ Health care power of attorney- Discuss with your healthcare provider and family.   Vitamin D Deficiency Vitamin D is an important vitamin that your body needs. Having too little of it in your body is called a deficiency. A very bad deficiency can make your bones soft and can cause a condition called rickets.   Vitamin D is important to your body for different reasons, such as:   It helps your body absorb 2 minerals called calcium and phosphorus.  It helps make your bones healthy.  It may prevent some diseases, such as diabetes and multiple sclerosis.  It helps your muscles and heart. You can get vitamin D in several ways. It is a natural part of some foods. The vitamin is also added to some dairy products and cereals. Some people take vitamin D supplements. Also, your body makes vitamin D when you are in the sun. It changes the sun's rays into a form of the vitamin that your body can use. CAUSES   Not eating enough foods that contain vitamin D.  Not getting enough sunlight.  Having certain digestive system diseases that make it hard to absorb vitamin D. These diseases include Crohn's disease, chronic pancreatitis, and cystic fibrosis.  Having a surgery in which part of the stomach or small intestine is removed.  Being obese. Fat cells pull vitamin D out of your blood. That means that obese people may not have enough vitamin D left in their blood and in other body tissues.  Having chronic kidney or liver disease. RISK FACTORS Risk factors are things that make you more likely to develop a vitamin D deficiency. They include:  Being older.  Not being able to get outside very much.  Living in a nursing home.  Having had broken bones.  Having weak or thin bones (osteoporosis).  Having a disease or condition that changes how your body absorbs vitamin D.  Having dark skin.  Some medicines such as seizure medicines or steroids.  Being overweight or obese. SYMPTOMS Mild cases of vitamin D deficiency may not have any symptoms. If you have a very bad case, symptoms may include:  Bone pain.  Muscle pain.  Falling often.  Broken bones caused by a minor injury, due to osteoporosis. DIAGNOSIS A blood test is the best way to tell if you have a vitamin D  deficiency. TREATMENT Vitamin D deficiency can be treated in different ways. Treatment for vitamin D deficiency depends on what is causing it. Options include:  Taking vitamin D supplements.  Taking a calcium supplement. Your caregiver will suggest what dose is best for you. HOME CARE INSTRUCTIONS  Take any supplements that your caregiver prescribes. Follow the directions carefully. Take only the suggested amount.  Have your blood tested 2 months after you start taking supplements.  Eat foods that contain vitamin D. Healthy choices include:  Fortified dairy products, cereals, or juices. Fortified means vitamin D has been added to the food. Check the label on the package to be sure.  Fatty fish like salmon or trout.  Eggs.  Oysters.  Spend some time in the sun. Most people should get out in the sun without sunblock for 10 to 15 minutes at a time. Do this 3 times a week. People who have had skin cancer should not do this. Ask your caregiver how long you should be in the sun. Do not use a tanning bed.  Keep your  weight at a healthy level. Lose weight if you need to.  Keep all follow-up appointments. Your caregiver will need to perform blood tests to make sure your vitamin D deficiency is going away. SEEK MEDICAL CARE IF:  You have any questions about your treatment.  You continue to have symptoms of vitamin D deficiency.  You have nausea or vomiting.  You are constipated.  You feel confused.  You have severe abdominal or back pain. MAKE SURE YOU:  Understand these instructions.  Will watch your condition.  Will get help right away if you are not doing well or get worse. Document Released: 09/19/2011 Document Reviewed: 09/19/2011 [facility] Patient Information 2013 [place], [place].   Vitamin D3  OTC 2000 IU and take 1 gelcap everyday. [employer] is a good brand.  The prescription that I have given you  For the compound cream can be filled at [facility] which  is located at [employer].  Ph#  [phone].  For sleep disturbance- try herbal teas (available at [employer] [place], [employer] or [employer]); there are several mixtures that are good for calming and anxiety as well as promoting better sleep. Also try lavender scented candles or bath oil to help calm and prepare you for restful sleep.

## 2012-12-16 ENCOUNTER — Other Ambulatory Visit: Payer: Self-pay | Admitting: Family Medicine

## 2013-02-07 ENCOUNTER — Encounter: Payer: Self-pay | Admitting: Cardiology

## 2013-02-07 NOTE — Progress Notes (Signed)
Patient ID: [name] [name], female   DOB: [birth year], 56 y.o.   MRN: [id number]  [name] [name], [name]  Date of visit:  02/07/2013 DOB:  06/05/1956    Age:  57 yrs. Medical record number:  [id number]     Account number:  [id number] Primary Care Provider: Dow Adolph ____________________________ CURRENT DIAGNOSES  1. Hypertension,Essential (Benign)  2. Hyperlipidemia  3. Aortic Valve Disorder  4. Personal History Of Malignant Neoplasm Of Large Intestine ____________________________ ALLERGIES  No Known Drug Allergies ____________________________ MEDICATIONS  1. Imodium A-D 2 mg tablet, PRN  2. meloxicam 15 mg Tablet, 1 p.o. daily  3. lisinopril-hydrochlorothiazide 20-12.5 mg tablet, 2 p.o. daily  4. pravastatin 80 mg tablet, 1 p.o. daily ____________________________ HISTORY OF PRESENT ILLNESS  Patient seen for cardiac followup. She has had a good year since she was previously here. She denies angina and has no PND, orthopnea, syncope, palpitations, or claudication. Her blood pressure is under good control at this time. She complains of some tingling involving both hands and has also been under some situational stress with a son who has had some drug problems. She denies other symptoms at this time but has gained some weight. ____________________________ PAST HISTORY  Past Medical Illnesses:  colon cancer  treated with surgery, chemo, and rad tx, hypertension, hyperlipidemia, peripheral neuropathy due to chemo;  Cardiovascular Illnesses:  aortic insufficiency;  Surgical Procedures:  back surgery, colon surgery;  Cardiology Procedures-Invasive:  no history of prior cardiac procedures;  Cardiology Procedures-Noninvasive:  echocardiogram November 2010, echocardiogram March 2013;  LVEF of 60% documented via echocardiogram on 09/14/2011,   ____________________________ CARDIO-PULMONARY TEST DATES EKG Date:  09/28/2011;  Echocardiography Date: 09/14/2011;   ____________________________ FAMILY  HISTORY Mother -- Not known - Adopted ____________________________ SOCIAL HISTORY Alcohol Use:  occasionally;  Smoking:  used to smoke but quit 1983;  Diet:  regular diet;  Lifestyle:  married;  Exercise:  some exercise;  Occupation:  Marine scientist;  Residence:  lives with husband and children;   ____________________________ REVIEW OF SYSTEMS General:  mild obesity, malaise and fatigue Respiratory: denies dyspnea, cough, wheezing or hemoptysis. Cardiovascular:  please review HPI Abdominal: constipation Musculoskeletal:  chronic low back pain, hand numbness Neurological:  occasional headaches Psychiatric:  insomnia, situational stress  ____________________________ PHYSICAL EXAMINATION VITAL SIGNS  Blood Pressure:  138/74 Sitting, Left arm, regular cuff   Pulse:  64/min. Weight:  166.00 lbs. Height:  63"BMI: 29  Constitutional:  pleasant white female, in no acute distress, mildly obese Head:  normocephalic, normal hair pattern, no masses or tenderness ENT:  ears, nose and throat reveal no gross abnormalities.  Dentition good. Neck:  supple, without massess. No JVD, thyromegaly or carotid bruits. Carotid upstroke normal. Chest:  normal symmetry, clear to auscultation and percussion. Cardiac:  regular rhythm, normal S1 and S2, no S3 or S4, 1/6 diastolic murmur Peripheral Pulses:  the femoral,dorsalis pedis, and posterior tibial pulses are full and equal bilaterally with no bruits auscultated. Neurological:  no gross motor or sensory deficits noted, affect appropriate, oriented x3. ____________________________ MOST RECENT LIPID PANEL 09/28/11  CHOL TOTL 176 mg/dl, LDL 86 calc, HDL 59 mg/dl, TRIGLYCER 308 mg/dl and CHOL/HDL 3.0 (Calc) ____________________________ IMPRESSIONS/PLAN  1. Hypertension controlled 2. Hyperlipidemia under treatment 3. Obesity with need to lose additional weight 4. Aortic valve disease with mild to moderate aortic regurgitation  Recommendations:  Her  hand numbness is likely carpal tunnel syndrome. Her blood pressure is controlled. Lab work was reviewed from previous. I will like her to have  a followup lipid panel and will see her in followup in one year with an echocardiogram at that time. Discussed importance of additional weight loss. ____________________________ TODAYS ORDERS  1. Return Visit: 1 year  2. 2D, color flow, doppler: 1 year  3. Lipid Panel: 3 weeks                       ____________________________ Cardiology Physician:  Darden Palmer MD [facility]

## 2013-02-15 ENCOUNTER — Other Ambulatory Visit: Payer: Self-pay | Admitting: Family Medicine

## 2013-03-08 ENCOUNTER — Telehealth: Payer: Self-pay | Admitting: Oncology

## 2013-03-08 NOTE — Telephone Encounter (Signed)
s.w. pt and advised on time change of 9.22.14 appt....pt ok and awre

## 2013-03-19 ENCOUNTER — Other Ambulatory Visit: Payer: Self-pay | Admitting: Family Medicine

## 2013-03-19 NOTE — Telephone Encounter (Signed)
Alprazolam refill phoned to pt's pharmacy. 

## 2013-03-31 ENCOUNTER — Other Ambulatory Visit: Payer: Self-pay

## 2013-03-31 DIAGNOSIS — D126 Benign neoplasm of colon, unspecified: Secondary | ICD-10-CM

## 2013-03-31 DIAGNOSIS — Z85038 Personal history of other malignant neoplasm of large intestine: Secondary | ICD-10-CM

## 2013-04-01 ENCOUNTER — Telehealth: Payer: Self-pay | Admitting: Internal Medicine

## 2013-04-01 ENCOUNTER — Ambulatory Visit (HOSPITAL_BASED_OUTPATIENT_CLINIC_OR_DEPARTMENT_OTHER): Payer: 59 | Admitting: Internal Medicine

## 2013-04-01 ENCOUNTER — Other Ambulatory Visit (HOSPITAL_BASED_OUTPATIENT_CLINIC_OR_DEPARTMENT_OTHER): Payer: 59 | Admitting: Lab

## 2013-04-01 ENCOUNTER — Encounter: Payer: Self-pay | Admitting: Internal Medicine

## 2013-04-01 VITALS — BP 143/64 | HR 83 | Temp 98.0°F | Resp 20 | Ht 62.0 in | Wt 167.4 lb

## 2013-04-01 DIAGNOSIS — Z85038 Personal history of other malignant neoplasm of large intestine: Secondary | ICD-10-CM

## 2013-04-01 LAB — COMPREHENSIVE METABOLIC PANEL (CC13)
ALT: 24 U/L (ref 0–55)
AST: 23 U/L (ref 5–34)
Albumin: 4 g/dL (ref 3.5–5.0)
Calcium: 9.5 mg/dL (ref 8.4–10.4)
Chloride: 103 mEq/L (ref 98–109)
Creatinine: 0.8 mg/dL (ref 0.6–1.1)
Glucose: 104 mg/dl (ref 70–140)
Potassium: 4.2 mEq/L (ref 3.5–5.1)
Sodium: 139 mEq/L (ref 136–145)
Total Bilirubin: 0.41 mg/dL (ref 0.20–1.20)
Total Protein: 7.5 g/dL (ref 6.4–8.3)

## 2013-04-01 LAB — CBC WITH DIFFERENTIAL/PLATELET
BASO%: 0.8 % (ref 0.0–2.0)
EOS%: 1 % (ref 0.0–7.0)
Eosinophils Absolute: 0.1 10*3/uL (ref 0.0–0.5)
LYMPH%: 21.8 % (ref 14.0–49.7)
MCH: 30.9 pg (ref 25.1–34.0)
MCHC: 34 g/dL (ref 31.5–36.0)
MONO#: 0.5 10*3/uL (ref 0.1–0.9)
Platelets: 162 10*3/uL (ref 145–400)
RBC: 4.27 10*6/uL (ref 3.70–5.45)
RDW: 12.5 % (ref 11.2–14.5)
WBC: 6.8 10*3/uL (ref 3.9–10.3)
lymph#: 1.5 10*3/uL (ref 0.9–3.3)

## 2013-04-01 LAB — LACTATE DEHYDROGENASE (CC13): LDH: 143 U/L (ref 125–245)

## 2013-04-01 NOTE — Telephone Encounter (Signed)
gave pt appt for lab and Md for September 2015

## 2013-04-01 NOTE — Progress Notes (Signed)
[facility] OFFICE PROGRESS NOTE  Dow Adolph, MD [address] [place] [address]  DIAGNOSIS: ADENOCARCINOMA, COLON, SIGMOID, HX OF  CC: Colon Cancer  CURRENT THERAPY: Observation  INTERVAL HISTORY: [name] [name] 57 y.o. female with a history of colon cancer (2004) presents for followup.  She was last seen by Dr. Arline Asp on 04/02/2012.  Today, she complains of numbness and tingling at the bottom of her feet.  Otherwise, she continues to do very well.  She reports being up-to-date on her screening exams with her colonoscopy nearly three years ago. She denies abdominal pain or changes in her stool habits.  She reports diarrhea with bouts of constipation that is unchanged.    MEDICAL HISTORY: Past Medical History  Diagnosis Date  . Essential hypertension, benign   . Knee pain   . Ganglion cyst   . Heart murmur   . Rectosigmoid cancer 2004     S/P Surgery, Chemo, Rad Tx,  (after chemo treatment menstral periods have ceased).  . Neuromuscular disorder   . Arthritis   . Depression   . Anxiety    ONCOLOGY HISTORY: Adenocarcinoma of the rectosigmoid area, moderately differentiated  T3 N0, stage II with negative margins and no lymphovascular  Invasion, diagnosed in July 2004. The patient underwent a low anterior resection on 03/10/2003. All 10 lymph nodes were negative. The patient underwent treatment consisting of 5-FU by continuous infusion along with pelvic radiation 5040 cGy in 28 fractions which concluded in late December 2004. The patient received initial chemotherapy preceding these treatments and then treatment with FOLFOX until June of 2005. She remains disease free.  INTERIM HISTORY: has COLONIC POLYPS, ADENOMATOUS; PERIPHERAL NEUROPATHY; HYPERTENSION; HERNIA, UMBILICAL; TOXIC GASTROENTERITIS AND COLITIS; DIVERTICULOSIS, COLON; ADENOCARCINOMA, COLON, SIGMOID, HX OF; Knee pain; and Anxiety disorder on her problem list.    ALLERGIES:  has No Known  Allergies.  MEDICATIONS: has a current medication list which includes the following prescription(s): alprazolam, lisinopril-hydrochlorothiazide, meloxicam, and pravastatin.  SURGICAL HISTORY:  Past Surgical History  Procedure Laterality Date  . Spine surgery  Feb 2007  . Colon surgery  Aug 2004    S/P Low Anterior Resxn (Sigmoid Colon)  . Back surgery      REVIEW OF SYSTEMS:   Constitutional: Denies fevers, chills or abnormal weight loss Eyes: Denies blurriness of vision Ears, nose, mouth, throat, and face: Denies mucositis or sore throat Respiratory: Denies cough, dyspnea or wheezes Cardiovascular: Denies palpitation, chest discomfort or lower extremity swelling Gastrointestinal:  Denies nausea, heartburn or change in bowel habits Skin: Denies abnormal skin rashes Lymphatics: Denies new lymphadenopathy or easy bruising Neurological:+ numbness, tingling of the soles of her feet that is unchanged from piror exams but no new weaknesses Behavioral/Psych: Mood is stable, no new changes  All other systems were reviewed with the patient and are negative.  PHYSICAL EXAMINATION: ECOG PERFORMANCE STATUS: 0 - Asymptomatic  Blood pressure 143/64, pulse 83, temperature 98 F (36.7 C), resp. rate 20, height 5\' 2"  (1.575 m), weight 167 lb 6.4 oz (75.932 kg).  GENERAL:alert, no distress and comfortable middle-aged female  SKIN: skin color, texture, turgor are normal, no rashes or significant lesions EYES: normal, Conjunctiva are pink and non-injected, sclera clear OROPHARYNX:no exudate, no erythema and lips, buccal mucosa, and tongue normal  NECK: supple, thyroid normal size, non-tender, without nodularity LYMPH:  no palpable lymphadenopathy in the cervical, axillary or inguinal LUNGS: clear to auscultation and percussion with normal breathing effort HEART: regular rate & rhythm and no murmurs and  no lower extremity edema ABDOMEN:abdomen soft, non-tender and normal bowel  sounds Musculoskeletal:no cyanosis of digits and no clubbing  NEURO: alert & oriented x 3 with fluent speech, no focal motor/sensory deficits   LABORATORY DATA: Results for orders placed in visit on 04/01/13 (from the past 48 hour(s))  CBC WITH DIFFERENTIAL     Status: None   Collection Time    04/01/13  3:28 PM      Result Value Range   WBC 6.8  3.9 - 10.3 10e3/uL   NEUT# 4.7  1.5 - 6.5 10e3/uL   HGB 13.2  11.6 - 15.9 g/dL   HCT 16.1  09.6 - 04.5 %   Platelets 162  145 - 400 10e3/uL   MCV 90.8  79.5 - 101.0 fL   MCH 30.9  25.1 - 34.0 pg   MCHC 34.0  31.5 - 36.0 g/dL   RBC 4.09  8.11 - 9.14 10e6/uL   RDW 12.5  11.2 - 14.5 %   lymph# 1.5  0.9 - 3.3 10e3/uL   MONO# 0.5  0.1 - 0.9 10e3/uL   Eosinophils Absolute 0.1  0.0 - 0.5 10e3/uL   Basophils Absolute 0.1  0.0 - 0.1 10e3/uL   NEUT% 68.9  38.4 - 76.8 %   LYMPH% 21.8  14.0 - 49.7 %   MONO% 7.5  0.0 - 14.0 %   EOS% 1.0  0.0 - 7.0 %   BASO% 0.8  0.0 - 2.0 %  LACTATE DEHYDROGENASE (CC13)     Status: None   Collection Time    04/01/13  3:29 PM      Result Value Range   LDH 143  125 - 245 U/L  COMPREHENSIVE METABOLIC PANEL (CC13)     Status: None   Collection Time    04/01/13  3:29 PM      Result Value Range   Sodium 139  136 - 145 mEq/L   Potassium 4.2  3.5 - 5.1 mEq/L   Chloride 103  98 - 109 mEq/L   CO2 25  22 - 29 mEq/L   Glucose 104  70 - 140 mg/dl   BUN 78.2  7.0 - 95.6 mg/dL   Creatinine 0.8  0.6 - 1.1 mg/dL   Total Bilirubin 2.13  0.20 - 1.20 mg/dL   Alkaline Phosphatase 51  40 - 150 U/L   AST 23  5 - 34 U/L   ALT 24  0 - 55 U/L   Total Protein 7.5  6.4 - 8.3 g/dL   Albumin 4.0  3.5 - 5.0 g/dL   Calcium 9.5  8.4 - 08.6 mg/dL      RADIOGRAPHIC STUDIES: No results found.  ASSESSMENT:  Colon cancer as noted above on observation.  PLAN:  --[name] continues to do well now, over 10 years from  the time of diagnosis.Her last chest xray was negative nearly one year ago. -- We will plan to see her back in  1 year at which time we will check CBC and chemistries.  --She reports that her  neuropathy is stable and lyrica did not help.  She requests avoiding additional medication at this time but will call if symptoms worsen.. --All questions were answered. The patient knows to call the clinic with any problems, questions or concerns. We can certainly see the patient much sooner if necessary.  I spent 10 minutes counseling the patient face to face. The total time spent in the appointment was 25 minutes.    Dene Landsberg,  MD 04/01/2013 4:36 PM

## 2013-04-02 ENCOUNTER — Encounter: Payer: Self-pay | Admitting: Internal Medicine

## 2013-05-18 ENCOUNTER — Other Ambulatory Visit: Payer: Self-pay | Admitting: Family Medicine

## 2013-05-21 ENCOUNTER — Other Ambulatory Visit: Payer: Self-pay | Admitting: Family Medicine

## 2013-05-21 NOTE — Telephone Encounter (Signed)
Alprazolam 1 mg tabs #30 w/ 1 refill phoned to pharmacy.

## 2013-05-22 NOTE — Telephone Encounter (Signed)
Alprazolam refill phoned to pharmacy; it this refill has been previously called in, pharmacy will disregard this authorization.

## 2013-08-01 ENCOUNTER — Ambulatory Visit (INDEPENDENT_AMBULATORY_CARE_PROVIDER_SITE_OTHER): Payer: 59 | Admitting: Family Medicine

## 2013-08-01 ENCOUNTER — Encounter: Payer: Self-pay | Admitting: Family Medicine

## 2013-08-01 VITALS — BP 138/72 | HR 71 | Temp 98.0°F | Resp 16 | Ht 62.5 in | Wt 166.4 lb

## 2013-08-01 DIAGNOSIS — Z23 Encounter for immunization: Secondary | ICD-10-CM

## 2013-08-01 DIAGNOSIS — F411 Generalized anxiety disorder: Secondary | ICD-10-CM

## 2013-08-01 DIAGNOSIS — F419 Anxiety disorder, unspecified: Secondary | ICD-10-CM

## 2013-08-01 MED ORDER — ALPRAZOLAM 1 MG PO TABS: ORAL_TABLET | ORAL | Status: DC

## 2013-08-01 NOTE — Progress Notes (Signed)
S:  This 58 y.o. Cauc female has chronic anxiety related to personal family issues. Alprazolam is taken every night to help pt relax and fall asleep. Pt has tried Ambien in the past but it has lingering sedative effect the following morning. She avoids electronic devices once she has gotten in the bed. This has helped improve sleep onset; she still awakens in middle of the night. This year, she plans to address fitness and embark on weight loss program. DJD in knees limits exercise. She remains compliant w/ BP and lipid medications; no adverse effects reported. Her cardiologist prescribes Lisinopril- HCTZ and Pravastatin.  Patient Active Problem List   Diagnosis Date Noted  . Anxiety disorder 11/24/2011  . Knee pain   . PERIPHERAL NEUROPATHY 10/13/2007  . HYPERTENSION 10/13/2007  . HERNIA, UMBILICAL 59/56/3875  . COLONIC POLYPS, ADENOMATOUS 02/20/2003  . DIVERTICULOSIS, COLON 02/20/2003  . ADENOCARCINOMA, COLON, SIGMOID, HX OF 02/20/2003   PMHx, Surg Hx and Soc Hx reviewed.  MEDICATIONS reconciled.  No new allergies reported.  O: Filed Vitals:   08/01/13 1130  BP: 138/72  Pulse: 71  Temp: 98 F (36.7 C)  Resp: 16   GEN: in NAD; WN,WD. HENT: [place]/AT; EOMI w/ clear conj/sclerae. Otherwise unremarkable. COR: RRR. LUNGS: Unlabored resp. SKIN: W&D; intact w/o diaphoresis or pallor. NEURO: A&O x 3; CNs intact. Nonfocal.  A/P: Anxiety disorder- Stable on Alprazolam; encouraged lifestyle modifications that would improve fitness and sleep hygiene.  Need for prophylactic vaccination and inoculation against influenza - Plan: Flu Vaccine QUAD 36+ mos IM  Meds ordered this encounter  Medications  . DISCONTD: ALPRAZolam (XANAX) 1 MG tablet    Sig: TAKE 1/2 TO 1 TABLET AT BEDTIME AS NEEDED FOR SLEEP    Dispense:  30 tablet    Refill:  2  . ALPRAZolam (XANAX) 1 MG tablet    Sig: TAKE 1/2 TO 1 TABLET AT BEDTIME AS NEEDED FOR SLEEP    Dispense:  30 tablet    Refill:  2    Do not fill  before 08/20/2013.

## 2013-08-26 ENCOUNTER — Other Ambulatory Visit: Payer: Self-pay | Admitting: Physician Assistant

## 2013-09-28 ENCOUNTER — Other Ambulatory Visit: Payer: Self-pay | Admitting: Family Medicine

## 2013-10-31 ENCOUNTER — Encounter: Payer: 59 | Admitting: Family Medicine

## 2013-12-19 ENCOUNTER — Ambulatory Visit (INDEPENDENT_AMBULATORY_CARE_PROVIDER_SITE_OTHER): Payer: 59 | Admitting: Family Medicine

## 2013-12-19 ENCOUNTER — Encounter: Payer: Self-pay | Admitting: Family Medicine

## 2013-12-19 VITALS — BP 142/77 | HR 94 | Temp 98.1°F | Resp 18 | Ht 65.0 in | Wt 165.6 lb

## 2013-12-19 DIAGNOSIS — F419 Anxiety disorder, unspecified: Secondary | ICD-10-CM

## 2013-12-19 DIAGNOSIS — F411 Generalized anxiety disorder: Secondary | ICD-10-CM

## 2013-12-19 MED ORDER — ALPRAZOLAM 1 MG PO TABS: ORAL_TABLET | ORAL | Status: DC

## 2013-12-19 MED ORDER — MELOXICAM 15 MG PO TABS: ORAL_TABLET | ORAL | Status: DC

## 2013-12-19 NOTE — Progress Notes (Signed)
S:  This 58 y.o. Cauc female is here for med refills. She has chronic anxiety and HTN, stable and controlled on current medication. No c/o diaphoresis, vision disturbances, CP or tightness, palpitations, cough, SOB or DOE, edema, HA, dizziness, numbness, weakness or syncope. She does not have confusion, excessive sedation, agitation, somnolence or concentration difficulties w/ anxiolytic.  Patient Active Problem List   Diagnosis Date Noted  . Anxiety disorder 11/24/2011  . Knee pain   . PERIPHERAL NEUROPATHY 10/13/2007  . HYPERTENSION 10/13/2007  . HERNIA, UMBILICAL 93/23/5573  . COLONIC POLYPS, ADENOMATOUS 02/20/2003  . DIVERTICULOSIS, COLON 02/20/2003  . ADENOCARCINOMA, COLON, SIGMOID, HX OF 02/20/2003     Prior to Admission medications   Medication Sig Start Date End Date Taking? Authorizing Provider  ALPRAZolam (XANAX) 1 MG tablet TAKE 1/2 TO 1 TABLET AT BEDTIME AS NEEDED FOR SLEEP   Yes Barton Fanny, MD  lisinopril-hydrochlorothiazide (PRINZIDE,ZESTORETIC) 20-12.5 MG per tablet Take 2 tablets by mouth daily.   Yes Historical Provider, MD  meloxicam (MOBIC) 15 MG tablet TAKE 1 TABLET EVERY DAY   Yes Barton Fanny, MD  pravastatin (PRAVACHOL) 80 MG tablet Take 80 mg by mouth daily.   Yes Historical Provider, MD   PMHx, Surg and Soc Hx reviewed.  ROS: AS per HPI.  O: Filed Vitals:   12/19/13 1119  BP: 142/77  Pulse: 94  Temp: 98.1 F (36.7 C)  Resp: 18   GEN: In NAD; WN,WD. HENT: [place]/AT; EOMI w/ clear conj/sclerae. Otherwise unremarkable. COR; RRR. LUNGS: Unlabored resp. SKIN: W&D; intact. NEURO: A&O x 3; CNs intact. Nonfocal. PSYCH: PHQ-9 score= 5.  A/P: Anxiety disorder-Controlled on current medication.  Meds ordered this encounter  Medications  . meloxicam (MOBIC) 15 MG tablet    Sig: TAKE 1 TABLET EVERY DAY    Dispense:  30 tablet    Refill:  5  . ALPRAZolam (XANAX) 1 MG tablet    Sig: TAKE 1/2 TO 1 TABLET AT BEDTIME AS NEEDED FOR SLEEP   Dispense:  30 tablet    Refill:  2

## 2014-02-06 ENCOUNTER — Encounter: Payer: Self-pay | Admitting: Family Medicine

## 2014-02-06 ENCOUNTER — Ambulatory Visit (INDEPENDENT_AMBULATORY_CARE_PROVIDER_SITE_OTHER): Payer: 59 | Admitting: Family Medicine

## 2014-02-06 VITALS — BP 110/60 | HR 89 | Temp 98.1°F | Resp 16 | Ht 62.25 in | Wt 162.2 lb

## 2014-02-06 DIAGNOSIS — Z0000 Encounter for general adult medical examination without abnormal findings: Secondary | ICD-10-CM

## 2014-02-06 DIAGNOSIS — F411 Generalized anxiety disorder: Secondary | ICD-10-CM

## 2014-02-06 DIAGNOSIS — R209 Unspecified disturbances of skin sensation: Secondary | ICD-10-CM

## 2014-02-06 DIAGNOSIS — R202 Paresthesia of skin: Secondary | ICD-10-CM

## 2014-02-06 DIAGNOSIS — Z1159 Encounter for screening for other viral diseases: Secondary | ICD-10-CM

## 2014-02-06 DIAGNOSIS — F419 Anxiety disorder, unspecified: Secondary | ICD-10-CM

## 2014-02-06 LAB — POCT URINALYSIS DIPSTICK
Bilirubin, UA: NEGATIVE
Blood, UA: NEGATIVE
Glucose, UA: NEGATIVE
KETONES UA: NEGATIVE
Leukocytes, UA: NEGATIVE
Nitrite, UA: NEGATIVE
PH UA: 6
PROTEIN UA: NEGATIVE
Spec Grav, UA: 1.02
Urobilinogen, UA: 0.2

## 2014-02-06 LAB — COMPLETE METABOLIC PANEL WITH GFR
ALBUMIN: 4.1 g/dL (ref 3.5–5.2)
ALT: 25 U/L (ref 0–35)
AST: 21 U/L (ref 0–37)
Alkaline Phosphatase: 45 U/L (ref 39–117)
BUN: 11 mg/dL (ref 6–23)
CALCIUM: 9.2 mg/dL (ref 8.4–10.5)
CHLORIDE: 100 meq/L (ref 96–112)
CO2: 27 mEq/L (ref 19–32)
CREATININE: 0.73 mg/dL (ref 0.50–1.10)
GFR, Est Non African American: >89 mL/min
Glucose, Bld: 107 mg/dL — ABNORMAL HIGH (ref 70–99)
POTASSIUM: 4.1 meq/L (ref 3.5–5.3)
Sodium: 137 mEq/L (ref 135–145)
Total Bilirubin: 0.6 mg/dL (ref 0.2–1.2)
Total Protein: 6.8 g/dL (ref 6.0–8.3)

## 2014-02-06 MED ORDER — ALPRAZOLAM 1 MG PO TABS: ORAL_TABLET | ORAL | Status: DC

## 2014-02-06 MED ORDER — DULOXETINE HCL 30 MG PO CPEP: 30.0000 mg | ORAL_CAPSULE | Freq: Every day | ORAL | Status: DC

## 2014-02-06 NOTE — Patient Instructions (Signed)
Keeping You Healthy  Get These Tests  Blood Pressure- Have your blood pressure checked by your healthcare provider at least once a year.  Normal blood pressure is 120/80.  Weight- Have your body mass index (BMI) calculated to screen for obesity.  BMI is a measure of body fat based on height and weight.  You can calculate your own BMI at www.nhlbisupport.com/bmi/  Cholesterol- Have your cholesterol checked every year.  Diabetes- Have your blood sugar checked every year if you have high blood pressure, high cholesterol, a family history of diabetes or if you are overweight.  Pap Smear- Have a pap smear every 1 to 3 years if you have been sexually active.  If you are older than 65 and recent pap smears have been normal you may not need additional pap smears.  In addition, if you have had a hysterectomy  For benign disease additional pap smears are not necessary.  Mammogram-Yearly mammograms are essential for early detection of breast cancer  Screening for Colon Cancer- Colonoscopy starting at age 50. Screening may begin sooner depending on your family history and other health conditions.  Follow up colonoscopy as directed by your Gastroenterologist.  Screening for Osteoporosis- Screening begins at age 65 with bone density scanning, sooner if you are at higher risk for developing Osteoporosis.  Get these medicines  Calcium with Vitamin D- Your body requires 1200-1500 mg of Calcium a day and 800-1000 IU of Vitamin D a day.  You can only absorb 500 mg of Calcium at a time therefore Calcium must be taken in 2 or 3 separate doses throughout the day.  Hormones- Hormone therapy has been associated with increased risk for certain cancers and heart disease.  Talk to your healthcare provider about if you need relief from menopausal symptoms.  Aspirin- Ask your healthcare provider about taking Aspirin to prevent Heart Disease and Stroke.  Get these Immuniztions  Flu shot- Every fall  Pneumonia  shot- Once after the age of 65; if you are younger ask your healthcare provider if you need a pneumonia shot.  Tetanus- Every ten years.  Zostavax- Once after the age of 60 to prevent shingles.  Take these steps  Don't smoke- Your healthcare provider can help you quit. For tips on how to quit, ask your healthcare provider or go to www.smokefree.gov or call 1-800 QUIT-NOW.  Be physically active- Exercise 5 days a week for a minimum of 30 minutes.  If you are not already physically active, start slow and gradually work up to 30 minutes of moderate physical activity.  Try walking, dancing, bike riding, swimming, etc.  Eat a healthy diet- Eat a variety of healthy foods such as fruits, vegetables, whole grains, low fat milk, low fat cheeses, yogurt, lean meats, chicken, fish, eggs, dried beans, tofu, etc.  For more information go to www.thenutritionsource.org  Dental visit- Brush and floss teeth twice daily; visit your dentist twice a year.  Eye exam- Visit your Optometrist or Ophthalmologist yearly.  Drink alcohol in moderation- Limit alcohol intake to one drink or less a day.  Never drink and drive.  Depression- Your emotional health is as important as your physical health.  If you're feeling down or losing interest in things you normally enjoy, please talk to your healthcare provider.  Seat Belts- can save your life; always wear one  Smoke/Carbon Monoxide detectors- These detectors need to be installed on the appropriate level of your home.  Replace batteries at least once a year.  Violence- If anyone   is threatening or hurting you, please tell your healthcare provider.  Living Will/ Health care power of attorney- Discuss with your healthcare provider and family.     Insomnia Insomnia is frequent trouble falling and/or staying asleep. Insomnia can be a long term problem or a short term problem. Both are common. Insomnia can be a short term problem when the wakefulness is related to a  certain stress or worry. Long term insomnia is often related to ongoing stress during waking hours and/or poor sleeping habits. Overtime, sleep deprivation itself can make the problem worse. Every little thing feels more severe because you are overtired and your ability to cope is decreased. CAUSES   Stress, anxiety, and depression.  Poor sleeping habits.  Distractions such as TV in the bedroom.  Naps close to bedtime.  Engaging in emotionally charged conversations before bed.  Technical reading before sleep.  Alcohol and other sedatives. They may make the problem worse. They can hurt normal sleep patterns and normal dream activity.  Stimulants such as caffeine for several hours prior to bedtime.  Pain syndromes and shortness of breath can cause insomnia.  Exercise late at night.  Changing time zones may cause sleeping problems (jet lag). It is sometimes helpful to have someone observe your sleeping patterns. They should look for periods of not breathing during the night (sleep apnea). They should also look to see how long those periods last. If you live alone or observers are uncertain, you can also be observed at a sleep clinic where your sleep patterns will be professionally monitored. Sleep apnea requires a checkup and treatment. Give your caregivers your medical history. Give your caregivers observations your family has made about your sleep.  SYMPTOMS   Not feeling rested in the morning.  Anxiety and restlessness at bedtime.  Difficulty falling and staying asleep. TREATMENT   Your caregiver may prescribe treatment for an underlying medical disorders. Your caregiver can give advice or help if you are using alcohol or other drugs for self-medication. Treatment of underlying problems will usually eliminate insomnia problems.  Medications can be prescribed for short time use. They are generally not recommended for lengthy use.  Over-the-counter sleep medicines are not  recommended for lengthy use. They can be habit forming.  You can promote easier sleeping by making lifestyle changes such as:  Using relaxation techniques that help with breathing and reduce muscle tension.  Exercising earlier in the day.  Changing your diet and the time of your last meal. No night time snacks.  Establish a regular time to go to bed.  Counseling can help with stressful problems and worry.  Soothing music and white noise may be helpful if there are background noises you cannot remove.  Stop tedious detailed work at least one hour before bedtime. HOME CARE INSTRUCTIONS   Keep a diary. Inform your caregiver about your progress. This includes any medication side effects. See your caregiver regularly. Take note of:  Times when you are asleep.  Times when you are awake during the night.  The quality of your sleep.  How you feel the next day. This information will help your caregiver care for you.  Get out of bed if you are still awake after 15 minutes. Read or do some quiet activity. Keep the lights down. Wait until you feel sleepy and go back to bed.  Keep regular sleeping and waking hours. Avoid naps.  Exercise regularly.  Avoid distractions at bedtime. Distractions include watching television or engaging in any intense  or detailed activity like attempting to balance the household checkbook.  Develop a bedtime ritual. Keep a familiar routine of bathing, brushing your teeth, climbing into bed at the same time each night, listening to soothing music. Routines increase the success of falling to sleep faster.  Use relaxation techniques. This can be using breathing and muscle tension release routines. It can also include visualizing peaceful scenes. You can also help control troubling or intruding thoughts by keeping your mind occupied with boring or repetitive thoughts like the old concept of counting sheep. You can make it more creative like imagining planting one  beautiful flower after another in your backyard garden.  During your day, work to eliminate stress. When this is not possible use some of the previous suggestions to help reduce the anxiety that accompanies stressful situations. MAKE SURE YOU:   Understand these instructions.  Will watch your condition.  Will get help right away if you are not doing well or get worse. Document Released: 06/24/2000 Document Revised: 09/19/2011 Document Reviewed: 07/25/2007 [facility] Patient Information 2015 [place], [place]. This information is not intended to replace advice given to you by your health care provider. Make sure you discuss any questions you have with your health care provider.
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
## 2014-02-07 DIAGNOSIS — F419 Anxiety disorder, unspecified: Secondary | ICD-10-CM | POA: Insufficient documentation

## 2014-02-07 HISTORY — DX: Anxiety disorder, unspecified: F41.9

## 2014-02-07 LAB — TSH: TSH: 2.024 u[IU]/mL (ref 0.350–4.500)

## 2014-02-07 LAB — VITAMIN D 25 HYDROXY (VIT D DEFICIENCY, FRACTURES): Vit D, 25-Hydroxy: 12 ng/mL — ABNORMAL LOW (ref 30–89)

## 2014-02-07 LAB — HEPATITIS C ANTIBODY: HCV AB: NEGATIVE

## 2014-02-08 ENCOUNTER — Other Ambulatory Visit: Payer: Self-pay | Admitting: Family Medicine

## 2014-02-08 MED ORDER — ERGOCALCIFEROL 1.25 MG (50000 UT) PO CAPS: 50000.0000 [IU] | ORAL_CAPSULE | ORAL | Status: DC

## 2014-02-08 NOTE — Progress Notes (Signed)
Quick Note:  Please advise pt regarding following labs... All labs are normal except Vitamin D level is very low. I am prescribing Vitamin D3 supplement to be taken once a week for rest of this year. The medication is at your pharmacy for pick-up. You also need to try to get ~ 10 minutes of sun exposure most days of the week and eat more foods that contain Vitamin D ( salmon, tuna, mackerel, sardines, mushrooms, eggs and some dairy products).  This level will need to be rechecked in about 12 weeks.  Your test for Hepatitis C is negative.  Contact the clinic if you have questions or concerns.  Copy to pt. ______

## 2014-04-01 ENCOUNTER — Ambulatory Visit (HOSPITAL_BASED_OUTPATIENT_CLINIC_OR_DEPARTMENT_OTHER): Payer: 59 | Admitting: Hematology

## 2014-04-01 ENCOUNTER — Telehealth: Payer: Self-pay | Admitting: Hematology

## 2014-04-01 ENCOUNTER — Encounter: Payer: Self-pay | Admitting: Hematology

## 2014-04-01 ENCOUNTER — Other Ambulatory Visit (HOSPITAL_BASED_OUTPATIENT_CLINIC_OR_DEPARTMENT_OTHER): Payer: 59

## 2014-04-01 VITALS — BP 158/70 | HR 89 | Temp 98.3°F | Resp 18 | Ht 62.25 in | Wt 157.3 lb

## 2014-04-01 DIAGNOSIS — M25551 Pain in right hip: Secondary | ICD-10-CM

## 2014-04-01 DIAGNOSIS — Z85038 Personal history of other malignant neoplasm of large intestine: Secondary | ICD-10-CM | POA: Diagnosis not present

## 2014-04-01 DIAGNOSIS — M25559 Pain in unspecified hip: Secondary | ICD-10-CM | POA: Diagnosis not present

## 2014-04-01 DIAGNOSIS — C189 Malignant neoplasm of colon, unspecified: Secondary | ICD-10-CM

## 2014-04-01 LAB — CBC & DIFF AND RETIC
BASO%: 0.4 % (ref 0.0–2.0)
Basophils Absolute: 0 10*3/uL (ref 0.0–0.1)
EOS%: 0.7 % (ref 0.0–7.0)
Eosinophils Absolute: 0.1 10*3/uL (ref 0.0–0.5)
HCT: 39.5 % (ref 34.8–46.6)
HGB: 13.5 g/dL (ref 11.6–15.9)
Immature Retic Fract: 8.2 % (ref 1.60–10.00)
LYMPH%: 24.7 % (ref 14.0–49.7)
MCH: 30.8 pg (ref 25.1–34.0)
MCHC: 34.2 g/dL (ref 31.5–36.0)
MCV: 90 fL (ref 79.5–101.0)
MONO#: 0.4 10*3/uL (ref 0.1–0.9)
MONO%: 6.3 % (ref 0.0–14.0)
NEUT#: 4.5 10*3/uL (ref 1.5–6.5)
NEUT%: 67.9 % (ref 38.4–76.8)
Platelets: 173 10*3/uL (ref 145–400)
RBC: 4.39 10*6/uL (ref 3.70–5.45)
RDW: 12.1 % (ref 11.2–14.5)
RETIC CT ABS: 107.56 10*3/uL — AB (ref 33.70–90.70)
Retic %: 2.45 % — ABNORMAL HIGH (ref 0.70–2.10)
WBC: 6.7 10*3/uL (ref 3.9–10.3)
lymph#: 1.7 10*3/uL (ref 0.9–3.3)

## 2014-04-01 LAB — COMPREHENSIVE METABOLIC PANEL (CC13)
ALK PHOS: 61 U/L (ref 40–150)
ALT: 28 U/L (ref 0–55)
AST: 26 U/L (ref 5–34)
Albumin: 3.9 g/dL (ref 3.5–5.0)
Anion Gap: 9 mEq/L (ref 3–11)
BUN: 8.4 mg/dL (ref 7.0–26.0)
CALCIUM: 9.3 mg/dL (ref 8.4–10.4)
CO2: 27 mEq/L (ref 22–29)
CREATININE: 0.8 mg/dL (ref 0.6–1.1)
Chloride: 100 mEq/L (ref 98–109)
Glucose: 113 mg/dl (ref 70–140)
Potassium: 4.2 mEq/L (ref 3.5–5.1)
Sodium: 136 mEq/L (ref 136–145)
Total Bilirubin: 0.57 mg/dL (ref 0.20–1.20)
Total Protein: 7.3 g/dL (ref 6.4–8.3)

## 2014-04-01 NOTE — Progress Notes (Signed)
[facility] PROGRESS NOTE 04/01/2014  [name] Lennox, MD [place] [address]  DIAGNOSIS: COLON CANCER FOR FOLLOW UP.   CURRENT THERAPY: Observation  INTERVAL HISTORY:  [name] [name] 58 y.o. female with a history of colon cancer (2004) presents for followup.  She was last seen by Dr. Juliann [name] on 04/01/2013 and comes for her annual visit.  Today, she complains of numbness and tingling at the bottom of her feet.  Otherwise, she continues to do very well.  She is now due for her colonoscopy and I will arrange a referral with Dr [name] [name]. She denies abdominal pain or changes in her stool habits.  She reports diarrhea with bouts of constipation that is unchanged.  She has right hip pain and wants to see an Orthopedics specialist and we will arrange referral. She also have a cardiac murmur and gets an echocardiogram and a follow up with a cardiologist every 2 years. She was also prescribed high dose of vitamin D on weekly basis. ROS is otherwise negative.  MEDICAL HISTORY: Past Medical History  Diagnosis Date  . Essential hypertension, benign   . Knee pain   . Ganglion cyst   . Heart murmur   . Rectosigmoid cancer 2004     S/P Surgery, Chemo, Rad Tx,  (after chemo treatment menstral periods have ceased).  . Neuromuscular disorder   . Arthritis   . Depression   . Anxiety    ONCOLOGY HISTORY: Adenocarcinoma of the rectosigmoid area, moderately differentiated T3 N0, stage II with negative margins and no lymphovascular Invasion, diagnosed in July 2004. The patient underwent a low anterior resection on 03/10/2003. All 10 lymph nodes were negative. The patient underwent treatment consisting of 5-FU by continuous infusion along with pelvic radiation 5040 cGy in 28 fractions which concluded in late December 2004. The patient received initial chemotherapy preceding these treatments and then treatment with FOLFOX until June of 2005. She remains  disease free.  INTERIM HISTORY: has COLONIC POLYPS, ADENOMATOUS; PERIPHERAL NEUROPATHY; HYPERTENSION; HERNIA, UMBILICAL; DIVERTICULOSIS, COLON; ADENOCARCINOMA, COLON, SIGMOID, HX OF; Knee pain; and ANXIETY DISORDER- mixed disorder w/ depressive symptoms on her problem list.    ALLERGIES:  has No Known Allergies.  MEDICATIONS: has a current medication list which includes the following prescription(s): alprazolam, duloxetine, ergocalciferol, lisinopril-hydrochlorothiazide, meloxicam, and pravastatin.  SURGICAL HISTORY:  Past Surgical History  Procedure Laterality Date  . Spine surgery  Feb 2007  . Colon surgery  Aug 2004    S/P Low Anterior Resxn (Sigmoid Colon)  . Back surgery      REVIEW OF SYSTEMS:   Constitutional: Denies fevers, chills or abnormal weight loss Eyes: Denies blurriness of vision Ears, nose, mouth, throat, and face: Denies mucositis or sore throat Respiratory: Denies cough, dyspnea or wheezes Cardiovascular: Denies palpitation, chest discomfort or lower extremity swelling Gastrointestinal:  Denies nausea, heartburn or change in bowel habits Skin: Denies abnormal skin rashes Lymphatics: Denies new lymphadenopathy or easy bruising Neurological:+ numbness, tingling of the soles of her feet that is unchanged from piror exams but no new weaknesses Behavioral/Psych: Mood is stable, no new changes  All other systems were reviewed with the patient and are negative.  PHYSICAL EXAMINATION: ECOG PERFORMANCE STATUS: 0  Blood pressure 158/70, pulse 89, temperature 98.3 F (36.8 C), temperature source Oral, resp. rate 18, height 5' 2.25" (1.581 m), weight 157 lb 4.8 oz (71.351 kg).  GENERAL:alert, no distress and comfortable middle-aged female  SKIN: skin color, texture, turgor are normal, no rashes  or significant lesions EYES: normal, Conjunctiva are pink and non-injected, sclera clear OROPHARYNX:no exudate, no erythema and lips, buccal mucosa, and tongue normal  NECK:  supple, thyroid normal size, non-tender, without nodularity LYMPH:  no palpable lymphadenopathy in the cervical, axillary or inguinal LUNGS: clear to auscultation and percussion with normal breathing effort HEART: regular rate & rhythm and no murmurs and no lower extremity edema ABDOMEN:abdomen soft, non-tender and normal bowel sounds Musculoskeletal:no cyanosis of digits and no clubbing  NEURO: alert & oriented x 3 with fluent speech, no focal motor/sensory deficits   LABORATORY DATA: Results for orders placed in visit on 04/01/14 (from the past 48 hour(s))  CBC & DIFF AND RETIC     Status: Abnormal   Collection Time    04/01/14  3:28 PM      Result Value Ref Range   WBC 6.7  3.9 - 10.3 10e3/uL   NEUT# 4.5  1.5 - 6.5 10e3/uL   HGB 13.5  11.6 - 15.9 g/dL   HCT 39.5  34.8 - 46.6 %   Platelets 173  145 - 400 10e3/uL   MCV 90.0  79.5 - 101.0 fL   MCH 30.8  25.1 - 34.0 pg   MCHC 34.2  31.5 - 36.0 g/dL   RBC 4.39  3.70 - 5.45 10e6/uL   RDW 12.1  11.2 - 14.5 %   lymph# 1.7  0.9 - 3.3 10e3/uL   MONO# 0.4  0.1 - 0.9 10e3/uL   Eosinophils Absolute 0.1  0.0 - 0.5 10e3/uL   Basophils Absolute 0.0  0.0 - 0.1 10e3/uL   NEUT% 67.9  38.4 - 76.8 %   LYMPH% 24.7  14.0 - 49.7 %   MONO% 6.3  0.0 - 14.0 %   EOS% 0.7  0.0 - 7.0 %   BASO% 0.4  0.0 - 2.0 %   Retic % 2.45 (*) 0.70 - 2.10 %   Retic Ct Abs 107.56 (*) 33.70 - 90.70 10e3/uL   Immature Retic Fract 8.20  1.60 - 10.00 %  COMPREHENSIVE METABOLIC PANEL (SA63)     Status: None   Collection Time    04/01/14  3:28 PM      Result Value Ref Range   Sodium 136  136 - 145 mEq/L   Potassium 4.2  3.5 - 5.1 mEq/L   Chloride 100  98 - 109 mEq/L   CO2 27  22 - 29 mEq/L   Glucose 113  70 - 140 mg/dl   BUN 8.4  7.0 - 26.0 mg/dL   Creatinine 0.8  0.6 - 1.1 mg/dL   Total Bilirubin 0.57  0.20 - 1.20 mg/dL   Alkaline Phosphatase 61  40 - 150 U/L   AST 26  5 - 34 U/L   ALT 28  0 - 55 U/L   Total Protein 7.3  6.4 - 8.3 g/dL   Albumin 3.9  3.5 - 5.0  g/dL   Calcium 9.3  8.4 - 10.4 mg/dL   Anion Gap 9  3 - 11 mEq/L      RADIOGRAPHIC STUDIES:  02/08/13 Mammogram at [facility] negative.  ASSESSMENT:  Colon cancer as noted above on observation.  PLAN:   --[name] continues to do well now, over 11 years from the time of diagnosis.Her last chest xray was negative in 2013. -- We will plan to see her back in 1 year at which time we will check CBC and chemistries.  --She reports that her  neuropathy is stable and lyrica  did not help.  She requests avoiding additional medication at this time but will call if symptoms worsen.. --All questions were answered. The patient knows to call the clinic with any problems, questions or concerns. We can certainly see the patient much sooner if necessary. --Setup a GI referral for her colonoscopy. --Setup an Orthopedics referral to evaluate her right hip  I spent 20 minutes counseling the patient face to face. The total time spent in the appointment was 25 minutes.    Bernadene Bell, MD Medical Hematologist/Oncologist [place] Pager: [phone] Office No: [phone]

## 2014-04-01 NOTE — Telephone Encounter (Signed)
Pt confirmed labs/ov per 09/22 POF, gave pt AVS...Marland KitchenMarland KitchenKJ spk Caryl Ada advised will put pt on recall list schedule not made until Nov. Pt states she will call her Orthopedic to schedule an apt w/[facility].[name]..KJ

## 2014-04-02 LAB — CEA: CEA: 0.8 ng/mL (ref 0.0–5.0)

## 2014-04-30 ENCOUNTER — Other Ambulatory Visit: Payer: Self-pay | Admitting: Orthopaedic Surgery

## 2014-05-19 ENCOUNTER — Ambulatory Visit (HOSPITAL_COMMUNITY)
Admission: RE | Admit: 2014-05-19 | Discharge: 2014-05-19 | Disposition: A | Discharge status: Home / Self Care | Payer: 59 | Source: Ambulatory Visit | Attending: Orthopaedic Surgery | Admitting: Orthopaedic Surgery

## 2014-05-19 ENCOUNTER — Encounter (HOSPITAL_COMMUNITY)
Admission: RE | Admit: 2014-05-19 | Discharge: 2014-05-19 | Disposition: A | Discharge status: Home / Self Care | Payer: 59 | Source: Ambulatory Visit | Attending: Orthopaedic Surgery | Admitting: Orthopaedic Surgery

## 2014-05-19 ENCOUNTER — Encounter (HOSPITAL_COMMUNITY): Payer: Self-pay

## 2014-05-19 DIAGNOSIS — Z01818 Encounter for other preprocedural examination: Secondary | ICD-10-CM

## 2014-05-19 LAB — CBC WITH DIFFERENTIAL/PLATELET
BASOS PCT: 0 % (ref 0–1)
Basophils Absolute: 0 10*3/uL (ref 0.0–0.1)
EOS ABS: 0 10*3/uL (ref 0.0–0.7)
Eosinophils Relative: 1 % (ref 0–5)
HEMATOCRIT: 42.3 % (ref 36.0–46.0)
Hemoglobin: 14.3 g/dL (ref 12.0–15.0)
Lymphocytes Relative: 20 % (ref 12–46)
Lymphs Abs: 1.3 10*3/uL (ref 0.7–4.0)
MCH: 30.6 pg (ref 26.0–34.0)
MCHC: 33.8 g/dL (ref 30.0–36.0)
MCV: 90.6 fL (ref 78.0–100.0)
MONO ABS: 0.5 10*3/uL (ref 0.1–1.0)
Monocytes Relative: 8 % (ref 3–12)
NEUTROS ABS: 4.6 10*3/uL (ref 1.7–7.7)
NEUTROS PCT: 71 % (ref 43–77)
Platelets: 192 10*3/uL (ref 150–400)
RBC: 4.67 MIL/uL (ref 3.87–5.11)
RDW: 12.2 % (ref 11.5–15.5)
WBC: 6.4 10*3/uL (ref 4.0–10.5)

## 2014-05-19 LAB — BASIC METABOLIC PANEL
ANION GAP: 17 — AB (ref 5–15)
BUN: 14 mg/dL (ref 6–23)
CALCIUM: 9.4 mg/dL (ref 8.4–10.5)
CO2: 24 mEq/L (ref 19–32)
Chloride: 96 mEq/L (ref 96–112)
Creatinine, Ser: 0.67 mg/dL (ref 0.50–1.10)
GFR calc non Af Amer: >90 mL/min (ref >90)
Glucose, Bld: 123 mg/dL — ABNORMAL HIGH (ref 70–99)
Potassium: 4.2 mEq/L (ref 3.7–5.3)
Sodium: 137 mEq/L (ref 137–147)

## 2014-05-19 LAB — SURGICAL PCR SCREEN
MRSA, PCR: NEGATIVE
Staphylococcus aureus: NEGATIVE

## 2014-05-19 LAB — PROTIME-INR
INR: 1.04 (ref 0.00–1.49)
Prothrombin Time: 13.7 seconds (ref 11.6–15.2)

## 2014-05-19 LAB — APTT: APTT: 30 s (ref 24–37)

## 2014-05-19 MED ORDER — CHLORHEXIDINE GLUCONATE 4 % EX LIQD: 60.0000 mL | Freq: Once | CUTANEOUS | Status: DC

## 2014-05-19 NOTE — Pre-Procedure Instructions (Signed)
EPSIE WALTHALL  05/19/2014   Your procedure is scheduled on:  Tuesday November 17 th at 0730 AM  Report to [facility] Admitting at 0530 AM.  Call this number if you have problems the morning of surgery: [phone]   Remember:   Do not eat food or drink liquids after midnight Monday   Take these medicines the morning of surgery with A SIP OF WATER: Cymbalta  Stop Aspirin, Meloxicam, Nsaids , Herbal medications and Vitamins 5 days prior to surgery.    Do not wear jewelry, make-up or nail polish.  Do not wear lotions, powders, or perfumes. You may not wear deodorant.  Do not shave 48 hours prior to surgery.   Do not bring valuables to the hospital.  [facility] is not responsible for any belongings or valuables.               Contacts, dentures or bridgework may not be worn into surgery.  Leave suitcase in the car. After surgery it may be brought to your room.  For patients admitted to the hospital, discharge time is determined by your treatment team.               Patients discharged the day of surgery will not be allowed to drive home.    Special Instructions: [facility] - Preparing for Surgery  Before surgery, you can play an important role.  Because skin is not sterile, your skin needs to be as free of germs as possible.  You can reduce the number of germs on you skin by washing with CHG (chlorahexidine gluconate) soap before surgery.  CHG is an antiseptic cleaner which kills germs and bonds with the skin to continue killing germs even after washing.  Please DO NOT use if you have an allergy to CHG or antibacterial soaps.  If your skin becomes reddened/irritated stop using the CHG and inform your nurse when you arrive at Short Stay.  Do not shave (including legs and underarms) for at least 48 hours prior to the first CHG shower.  You may shave your face.  Please follow these instructions carefully:   1.  Shower with CHG Soap the night before surgery and the                                 morning of Surgery.  2.  If you choose to wash your hair, wash your hair first as usual with your       normal shampoo.  3.  After you shampoo, rinse your hair and body thoroughly to remove the                      Shampoo.  4.  Use CHG as you would any other liquid soap.  You can apply chg directly       to the skin and wash gently with scrungie or a clean washcloth.  5.  Apply the CHG Soap to your body ONLY FROM THE NECK DOWN.        Do not use on open wounds or open sores.  Avoid contact with your eyes,       ears, mouth and genitals (private parts).  Wash genitals (private parts)       with your normal soap.  6.  Wash thoroughly, paying special attention to the area where your surgery  will be performed.  7.  Thoroughly rinse your body with warm water from the neck down.  8.  DO NOT shower/wash with your normal soap after using and rinsing off       the CHG Soap.  9.  Pat yourself dry with a clean towel.            10.  Wear clean pajamas.            11.  Place clean sheets on your bed the night of your first shower and do not        sleep with pets.  Day of Surgery  Do not apply any lotions/deoderants the morning of surgery.  Please wear clean clothes to the hospital/surgery center.      Please read over the following fact sheets that you were given: Pain Booklet, Coughing and Deep Breathing, Blood Transfusion Information, MRSA Information and Surgical Site Infection Prevention

## 2014-05-21 LAB — URINE CULTURE

## 2014-05-24 NOTE — H&P (Signed)
TOTAL HIP ADMISSION H&P  Patient is admitted for right total hip arthroplasty.  Subjective:  Chief Complaint: right hip pain  HPI: [name] [name], 58 y.o. female, has a history of pain and functional disability in the right hip(s) due to arthritis and patient has failed non-surgical conservative treatments for greater than 12 weeks to include NSAID's and/or analgesics, flexibility and strengthening excercises, use of assistive devices, weight reduction as appropriate and activity modification.  Onset of symptoms was gradual starting 5 years ago with gradually worsening course since that time.The patient noted no past surgery on the right hip(s).  Patient currently rates pain in the right hip at 10 out of 10 with activity. Patient has night pain, worsening of pain with activity and weight bearing, pain that interfers with activities of daily living and crepitus. Patient has evidence of subchondral cysts, subchondral sclerosis, periarticular osteophytes and joint space narrowing by imaging studies. This condition presents safety issues increasing the risk of falls. There is no current active infection.  Patient Active Problem List   Diagnosis Date Noted  . ANXIETY DISORDER- mixed disorder w/ depressive symptoms 02/07/2014  . Knee pain   . PERIPHERAL NEUROPATHY 10/13/2007  . HYPERTENSION 10/13/2007  . HERNIA, UMBILICAL 16/04/9603  . COLONIC POLYPS, ADENOMATOUS 02/20/2003  . DIVERTICULOSIS, COLON 02/20/2003  . ADENOCARCINOMA, COLON, SIGMOID, HX OF 02/20/2003   Past Medical History  Diagnosis Date  . Essential hypertension, benign   . Knee pain   . Ganglion cyst   . Heart murmur   . Rectosigmoid cancer 2004     S/P Surgery, Chemo, Rad Tx,  (after chemo treatment menstral periods have ceased).  . Neuromuscular disorder   . Arthritis   . Depression   . Anxiety   . Headache     Past Surgical History  Procedure Laterality Date  . Spine surgery  Feb 2007  . Colon surgery  Aug 2004   S/P Low Anterior Resxn (Sigmoid Colon)  . Back surgery    . Dilation and curettage of uterus      No prescriptions prior to admission   No Known Allergies  History  Substance Use Topics  . Smoking status: Former Research scientist (life sciences)  . Smokeless tobacco: Never Used     Comment: college days  . Alcohol Use: Yes     Comment: occ    Family History  Problem Relation Age of Onset  . Adopted: Yes     Review of Systems  Musculoskeletal: Positive for joint pain.       Right hip  All other systems reviewed and are negative.   Objective:  Physical Exam  Constitutional: She is oriented to person, place, and time. She appears well-developed and well-nourished.  HENT:  Head: Normocephalic and atraumatic.  Eyes: Conjunctivae are normal. Pupils are equal, round, and reactive to light.  Neck: Normal range of motion.  Cardiovascular: Normal rate and regular rhythm.   Respiratory: Effort normal.  GI: Soft.  Musculoskeletal:  Right hip motion is extremely limited and very painful in terms of rotation.  She has full forward flexion and no hip flexion contracture.  Opposite hip moves relatively well but also elicits some groin pain on full internal rotation.  Leg lengths are roughly equal.  Sensation and motor function are intact in her feet with palpable pulses on both sides.  There is no palpable lymphadenopathy about her groin.    Neurological: She is alert and oriented to person, place, and time.  Skin: Skin is warm and dry.  Psychiatric: She has a normal mood and affect. Her behavior is normal. Judgment and thought content normal.    Vital signs in last 24 hours:    Labs:   Estimated body mass index is 28.55 kg/(m^2) as calculated from the following:   Height as of 04/01/14: 5' 2.25" (1.581 m).   Weight as of 04/01/14: 71.351 kg (157 lb 4.8 oz).   Imaging Review Plain radiographs demonstrate severe degenerative joint disease of the right hip(s). The bone quality appears to be good for age and  reported activity level.  Assessment/Plan:  End stage primary arthritis, right hip(s)  The patient history, physical examination, clinical judgement of the provider and imaging studies are consistent with end stage degenerative joint disease of the right hip(s) and total hip arthroplasty is deemed medically necessary. The treatment options including medical management, injection therapy, arthroscopy and arthroplasty were discussed at length. The risks and benefits of total hip arthroplasty were presented and reviewed. The risks due to aseptic loosening, infection, stiffness, dislocation/subluxation,  thromboembolic complications and other imponderables were discussed.  The patient acknowledged the explanation, agreed to proceed with the plan and consent was signed. Patient is being admitted for inpatient treatment for surgery, pain control, PT, OT, prophylactic antibiotics, VTE prophylaxis, progressive ambulation and ADL's and discharge planning.The patient is planning to be discharged home with home health services

## 2014-05-26 MED ORDER — CEFAZOLIN SODIUM-DEXTROSE 2-3 GM-% IV SOLR
2.0000 g | INTRAVENOUS | Status: AC
  Administered 2014-05-27: 2 g via INTRAVENOUS
  Filled 2014-05-26: qty 50

## 2014-05-27 ENCOUNTER — Inpatient Hospital Stay (HOSPITAL_COMMUNITY): Payer: 59

## 2014-05-27 ENCOUNTER — Inpatient Hospital Stay (HOSPITAL_COMMUNITY): Payer: 59 | Admitting: Critical Care Medicine

## 2014-05-27 ENCOUNTER — Inpatient Hospital Stay (HOSPITAL_COMMUNITY)
Admission: RE | Admit: 2014-05-27 | Discharge: 2014-05-30 | DRG: 470 | Disposition: A | Discharge status: Home Health | Payer: 59 | Source: Ambulatory Visit | Attending: Orthopaedic Surgery | Admitting: Orthopaedic Surgery

## 2014-05-27 ENCOUNTER — Encounter (HOSPITAL_COMMUNITY): Payer: Self-pay | Admitting: Critical Care Medicine

## 2014-05-27 ENCOUNTER — Encounter (HOSPITAL_COMMUNITY)
Admission: RE | Disposition: A | Discharge status: Home Health | Payer: Self-pay | Source: Ambulatory Visit | Attending: Orthopaedic Surgery

## 2014-05-27 DIAGNOSIS — F329 Major depressive disorder, single episode, unspecified: Secondary | ICD-10-CM | POA: Diagnosis present

## 2014-05-27 DIAGNOSIS — Z923 Personal history of irradiation: Secondary | ICD-10-CM

## 2014-05-27 DIAGNOSIS — M1611 Unilateral primary osteoarthritis, right hip: Principal | ICD-10-CM | POA: Diagnosis present

## 2014-05-27 DIAGNOSIS — D62 Acute posthemorrhagic anemia: Secondary | ICD-10-CM | POA: Diagnosis not present

## 2014-05-27 DIAGNOSIS — R112 Nausea with vomiting, unspecified: Secondary | ICD-10-CM | POA: Diagnosis not present

## 2014-05-27 DIAGNOSIS — Z96643 Presence of artificial hip joint, bilateral: Secondary | ICD-10-CM

## 2014-05-27 DIAGNOSIS — F419 Anxiety disorder, unspecified: Secondary | ICD-10-CM | POA: Diagnosis present

## 2014-05-27 DIAGNOSIS — Z87891 Personal history of nicotine dependence: Secondary | ICD-10-CM

## 2014-05-27 DIAGNOSIS — Z79899 Other long term (current) drug therapy: Secondary | ICD-10-CM

## 2014-05-27 DIAGNOSIS — G8918 Other acute postprocedural pain: Secondary | ICD-10-CM | POA: Diagnosis not present

## 2014-05-27 DIAGNOSIS — Z9221 Personal history of antineoplastic chemotherapy: Secondary | ICD-10-CM | POA: Diagnosis not present

## 2014-05-27 DIAGNOSIS — Z85048 Personal history of other malignant neoplasm of rectum, rectosigmoid junction, and anus: Secondary | ICD-10-CM | POA: Diagnosis not present

## 2014-05-27 DIAGNOSIS — K5900 Constipation, unspecified: Secondary | ICD-10-CM | POA: Diagnosis present

## 2014-05-27 DIAGNOSIS — I10 Essential (primary) hypertension: Secondary | ICD-10-CM | POA: Diagnosis present

## 2014-05-27 DIAGNOSIS — E780 Pure hypercholesterolemia: Secondary | ICD-10-CM | POA: Diagnosis present

## 2014-05-27 DIAGNOSIS — M169 Osteoarthritis of hip, unspecified: Secondary | ICD-10-CM

## 2014-05-27 DIAGNOSIS — Z7982 Long term (current) use of aspirin: Secondary | ICD-10-CM

## 2014-05-27 DIAGNOSIS — M25551 Pain in right hip: Secondary | ICD-10-CM | POA: Diagnosis present

## 2014-05-27 HISTORY — DX: Presence of artificial hip joint, bilateral: Z96.643

## 2014-05-27 HISTORY — PX: TOTAL HIP ARTHROPLASTY: SHX124

## 2014-05-27 HISTORY — DX: Pure hypercholesterolemia, unspecified: E78.00

## 2014-05-27 SURGERY — ARTHROPLASTY, HIP, TOTAL, ANTERIOR APPROACH
Anesthesia: Monitor Anesthesia Care | Site: Hip | Laterality: Right

## 2014-05-27 MED ORDER — METHOCARBAMOL 500 MG PO TABS
500.0000 mg | ORAL_TABLET | Freq: Four times a day (QID) | ORAL | Status: DC | PRN
  Administered 2014-05-27 – 2014-05-30 (×6): 500 mg via ORAL
  Filled 2014-05-27 (×5): qty 1

## 2014-05-27 MED ORDER — ALPRAZOLAM 0.5 MG PO TABS
0.5000 mg | ORAL_TABLET | Freq: Every evening | ORAL | Status: DC | PRN
  Administered 2014-05-28 (×2): 1 mg via ORAL
  Filled 2014-05-27 (×2): qty 2

## 2014-05-27 MED ORDER — OXYCODONE HCL 5 MG PO TABS
5.0000 mg | ORAL_TABLET | Freq: Once | ORAL | Status: AC | PRN
  Administered 2014-05-27: 5 mg via ORAL

## 2014-05-27 MED ORDER — OXYCODONE HCL 5 MG/5ML PO SOLN: 5.0000 mg | Freq: Once | ORAL | Status: AC | PRN

## 2014-05-27 MED ORDER — ERGOCALCIFEROL 1.25 MG (50000 UT) PO CAPS: 50000.0000 [IU] | ORAL_CAPSULE | ORAL | Status: DC

## 2014-05-27 MED ORDER — HYDROMORPHONE HCL 1 MG/ML IJ SOLN
INTRAMUSCULAR | Status: AC
  Administered 2014-05-27: 0.5 mg via INTRAVENOUS
  Filled 2014-05-27: qty 1

## 2014-05-27 MED ORDER — CEFAZOLIN SODIUM-DEXTROSE 2-3 GM-% IV SOLR
2.0000 g | Freq: Four times a day (QID) | INTRAVENOUS | Status: AC
  Administered 2014-05-27 (×2): 2 g via INTRAVENOUS
  Filled 2014-05-27 (×2): qty 50

## 2014-05-27 MED ORDER — ONDANSETRON HCL 4 MG/2ML IJ SOLN
INTRAMUSCULAR | Status: DC | PRN
  Administered 2014-05-27: 4 mg via INTRAVENOUS

## 2014-05-27 MED ORDER — VITAMIN D (ERGOCALCIFEROL) 1.25 MG (50000 UNIT) PO CAPS: 50000.0000 [IU] | ORAL_CAPSULE | ORAL | Status: DC

## 2014-05-27 MED ORDER — HYDROCODONE-ACETAMINOPHEN 5-325 MG PO TABS
1.0000 | ORAL_TABLET | ORAL | Status: DC | PRN
  Administered 2014-05-27: 2 via ORAL
  Administered 2014-05-27: 1 via ORAL
  Filled 2014-05-27: qty 1

## 2014-05-27 MED ORDER — OXYCODONE HCL 5 MG PO TABS
ORAL_TABLET | ORAL | Status: AC
  Administered 2014-05-27: 5 mg via ORAL
  Filled 2014-05-27: qty 1

## 2014-05-27 MED ORDER — HYDROCHLOROTHIAZIDE 25 MG PO TABS
25.0000 mg | ORAL_TABLET | Freq: Every day | ORAL | Status: DC
  Administered 2014-05-28 – 2014-05-30 (×2): 25 mg via ORAL
  Filled 2014-05-27 (×3): qty 1

## 2014-05-27 MED ORDER — 0.9 % SODIUM CHLORIDE (POUR BTL) OPTIME
TOPICAL | Status: DC | PRN
  Administered 2014-05-27: 1000 mL

## 2014-05-27 MED ORDER — ROCURONIUM BROMIDE 50 MG/5ML IV SOLN
INTRAVENOUS | Status: AC
  Filled 2014-05-27: qty 1

## 2014-05-27 MED ORDER — HYDROMORPHONE HCL 1 MG/ML IJ SOLN
0.5000 mg | INTRAMUSCULAR | Status: DC | PRN
  Administered 2014-05-27 – 2014-05-28 (×4): 1 mg via INTRAVENOUS
  Filled 2014-05-27 (×3): qty 1

## 2014-05-27 MED ORDER — LACTATED RINGERS IV SOLN
INTRAVENOUS | Status: DC | PRN
  Administered 2014-05-27 (×3): via INTRAVENOUS

## 2014-05-27 MED ORDER — EPHEDRINE SULFATE 50 MG/ML IJ SOLN
INTRAMUSCULAR | Status: AC
  Filled 2014-05-27: qty 1

## 2014-05-27 MED ORDER — SUCCINYLCHOLINE CHLORIDE 20 MG/ML IJ SOLN
INTRAMUSCULAR | Status: AC
  Filled 2014-05-27: qty 1

## 2014-05-27 MED ORDER — MIDAZOLAM HCL 5 MG/5ML IJ SOLN
INTRAMUSCULAR | Status: DC | PRN
  Administered 2014-05-27: 2 mg via INTRAVENOUS

## 2014-05-27 MED ORDER — BISACODYL 5 MG PO TBEC
5.0000 mg | DELAYED_RELEASE_TABLET | Freq: Every day | ORAL | Status: DC | PRN
  Administered 2014-05-27: 5 mg via ORAL
  Filled 2014-05-27: qty 1

## 2014-05-27 MED ORDER — DOCUSATE SODIUM 100 MG PO CAPS
100.0000 mg | ORAL_CAPSULE | Freq: Two times a day (BID) | ORAL | Status: DC
  Administered 2014-05-27 – 2014-05-30 (×7): 100 mg via ORAL
  Filled 2014-05-27 (×9): qty 1

## 2014-05-27 MED ORDER — PHENOL 1.4 % MT LIQD
1.0000 | OROMUCOSAL | Status: DC | PRN
Start: 2014-05-27 — End: 2014-05-30

## 2014-05-27 MED ORDER — LACTATED RINGERS IV SOLN: INTRAVENOUS | Status: DC

## 2014-05-27 MED ORDER — PROPOFOL 10 MG/ML IV BOLUS
INTRAVENOUS | Status: AC
  Filled 2014-05-27: qty 20

## 2014-05-27 MED ORDER — ALBUMIN HUMAN 5 % IV SOLN
INTRAVENOUS | Status: DC | PRN
  Administered 2014-05-27: 09:00:00 via INTRAVENOUS

## 2014-05-27 MED ORDER — ALUM & MAG HYDROXIDE-SIMETH 200-200-20 MG/5ML PO SUSP: 30.0000 mL | ORAL | Status: DC | PRN

## 2014-05-27 MED ORDER — ONDANSETRON HCL 4 MG/2ML IJ SOLN
INTRAMUSCULAR | Status: AC
  Filled 2014-05-27: qty 2

## 2014-05-27 MED ORDER — ACETAMINOPHEN 10 MG/ML IV SOLN
1000.0000 mg | Freq: Four times a day (QID) | INTRAVENOUS | Status: DC
  Administered 2014-05-27: 1000 mg via INTRAVENOUS

## 2014-05-27 MED ORDER — HYDROCODONE-ACETAMINOPHEN 5-325 MG PO TABS
ORAL_TABLET | ORAL | Status: AC
  Filled 2014-05-27: qty 2

## 2014-05-27 MED ORDER — LISINOPRIL 40 MG PO TABS
40.0000 mg | ORAL_TABLET | Freq: Every day | ORAL | Status: DC
  Administered 2014-05-28 – 2014-05-30 (×2): 40 mg via ORAL
  Filled 2014-05-27 (×3): qty 1

## 2014-05-27 MED ORDER — MIDAZOLAM HCL 2 MG/2ML IJ SOLN
INTRAMUSCULAR | Status: AC
  Filled 2014-05-27: qty 2

## 2014-05-27 MED ORDER — DULOXETINE HCL 30 MG PO CPEP
30.0000 mg | ORAL_CAPSULE | Freq: Every day | ORAL | Status: DC
  Administered 2014-05-28 – 2014-05-30 (×3): 30 mg via ORAL
  Filled 2014-05-27 (×4): qty 1

## 2014-05-27 MED ORDER — ACETAMINOPHEN 650 MG RE SUPP: 650.0000 mg | Freq: Four times a day (QID) | RECTAL | Status: DC | PRN

## 2014-05-27 MED ORDER — METHOCARBAMOL 500 MG PO TABS
ORAL_TABLET | ORAL | Status: AC
  Filled 2014-05-27: qty 1

## 2014-05-27 MED ORDER — ASPIRIN EC 325 MG PO TBEC
325.0000 mg | DELAYED_RELEASE_TABLET | Freq: Two times a day (BID) | ORAL | Status: DC
  Administered 2014-05-28 – 2014-05-30 (×5): 325 mg via ORAL
  Filled 2014-05-27 (×7): qty 1

## 2014-05-27 MED ORDER — DEXTROSE 5 % IV SOLN
500.0000 mg | Freq: Four times a day (QID) | INTRAVENOUS | Status: DC | PRN
  Filled 2014-05-27: qty 5

## 2014-05-27 MED ORDER — PHENYLEPHRINE 40 MCG/ML (10ML) SYRINGE FOR IV PUSH (FOR BLOOD PRESSURE SUPPORT)
PREFILLED_SYRINGE | INTRAVENOUS | Status: AC
  Filled 2014-05-27: qty 10

## 2014-05-27 MED ORDER — HYDROMORPHONE HCL 1 MG/ML IJ SOLN
0.5000 mg | INTRAMUSCULAR | Status: AC | PRN
  Administered 2014-05-27 (×4): 0.5 mg via INTRAVENOUS

## 2014-05-27 MED ORDER — METOCLOPRAMIDE HCL 10 MG PO TABS: 5.0000 mg | ORAL_TABLET | Freq: Three times a day (TID) | ORAL | Status: DC | PRN

## 2014-05-27 MED ORDER — LIDOCAINE HCL (CARDIAC) 20 MG/ML IV SOLN
INTRAVENOUS | Status: AC
  Filled 2014-05-27: qty 5

## 2014-05-27 MED ORDER — FENTANYL CITRATE 0.05 MG/ML IJ SOLN
INTRAMUSCULAR | Status: AC
  Filled 2014-05-27: qty 5

## 2014-05-27 MED ORDER — ACETAMINOPHEN 10 MG/ML IV SOLN
INTRAVENOUS | Status: AC
  Administered 2014-05-27: 1000 mg via INTRAVENOUS
  Filled 2014-05-27: qty 100

## 2014-05-27 MED ORDER — ONDANSETRON HCL 4 MG/2ML IJ SOLN
4.0000 mg | Freq: Four times a day (QID) | INTRAMUSCULAR | Status: DC | PRN
  Administered 2014-05-27: 4 mg via INTRAVENOUS
  Filled 2014-05-27: qty 2

## 2014-05-27 MED ORDER — PROPOFOL INFUSION 10 MG/ML OPTIME
INTRAVENOUS | Status: DC | PRN
  Administered 2014-05-27: 50 ug/kg/min via INTRAVENOUS

## 2014-05-27 MED ORDER — SCOPOLAMINE 1 MG/3DAYS TD PT72
1.0000 | MEDICATED_PATCH | TRANSDERMAL | Status: DC
  Administered 2014-05-27: 1.5 mg via TRANSDERMAL
  Filled 2014-05-27 (×2): qty 1

## 2014-05-27 MED ORDER — INFLUENZA VAC SPLIT QUAD 0.5 ML IM SUSY
0.5000 mL | PREFILLED_SYRINGE | INTRAMUSCULAR | Status: AC
  Administered 2014-05-29: 0.5 mL via INTRAMUSCULAR
  Filled 2014-05-27: qty 0.5

## 2014-05-27 MED ORDER — HYDROCHLOROTHIAZIDE 25 MG PO TABS
25.0000 mg | ORAL_TABLET | Freq: Every day | ORAL | Status: DC
  Filled 2014-05-27: qty 1

## 2014-05-27 MED ORDER — HYDROMORPHONE HCL 1 MG/ML IJ SOLN
0.5000 mg | INTRAMUSCULAR | Status: AC | PRN
  Administered 2014-05-27 (×2): 0.5 mg via INTRAVENOUS

## 2014-05-27 MED ORDER — FENTANYL CITRATE 0.05 MG/ML IJ SOLN
INTRAMUSCULAR | Status: DC | PRN
  Administered 2014-05-27 (×5): 25 ug via INTRAVENOUS
  Administered 2014-05-27 (×2): 50 ug via INTRAVENOUS
  Administered 2014-05-27: 25 ug via INTRAVENOUS

## 2014-05-27 MED ORDER — LISINOPRIL 40 MG PO TABS
40.0000 mg | ORAL_TABLET | Freq: Every day | ORAL | Status: DC
  Filled 2014-05-27: qty 1

## 2014-05-27 MED ORDER — LISINOPRIL-HYDROCHLOROTHIAZIDE 20-12.5 MG PO TABS: 2.0000 | ORAL_TABLET | Freq: Every day | ORAL | Status: DC

## 2014-05-27 MED ORDER — PHENYLEPHRINE HCL 10 MG/ML IJ SOLN
INTRAMUSCULAR | Status: DC | PRN
  Administered 2014-05-27: 80 ug via INTRAVENOUS
  Administered 2014-05-27: 120 ug via INTRAVENOUS
  Administered 2014-05-27 (×3): 80 ug via INTRAVENOUS
  Administered 2014-05-27: 40 ug via INTRAVENOUS

## 2014-05-27 MED ORDER — TRANEXAMIC ACID 100 MG/ML IV SOLN
1000.0000 mg | INTRAVENOUS | Status: AC
  Administered 2014-05-27: 1000 mg via INTRAVENOUS
  Filled 2014-05-27: qty 10

## 2014-05-27 MED ORDER — METOCLOPRAMIDE HCL 5 MG/ML IJ SOLN
5.0000 mg | Freq: Three times a day (TID) | INTRAMUSCULAR | Status: DC | PRN
  Administered 2014-05-27: 10 mg via INTRAVENOUS
  Filled 2014-05-27: qty 2

## 2014-05-27 MED ORDER — DIPHENHYDRAMINE HCL 12.5 MG/5ML PO ELIX: 12.5000 mg | ORAL_SOLUTION | ORAL | Status: DC | PRN

## 2014-05-27 MED ORDER — ONDANSETRON HCL 4 MG PO TABS: 4.0000 mg | ORAL_TABLET | Freq: Four times a day (QID) | ORAL | Status: DC | PRN

## 2014-05-27 MED ORDER — LACTATED RINGERS IV SOLN
INTRAVENOUS | Status: DC
  Administered 2014-05-27 – 2014-05-28 (×2): via INTRAVENOUS

## 2014-05-27 MED ORDER — MENTHOL 3 MG MT LOZG: 1.0000 | LOZENGE | OROMUCOSAL | Status: DC | PRN

## 2014-05-27 MED ORDER — PRAVASTATIN SODIUM 80 MG PO TABS
80.0000 mg | ORAL_TABLET | Freq: Every day | ORAL | Status: DC
  Administered 2014-05-28 – 2014-05-30 (×3): 80 mg via ORAL
  Filled 2014-05-27 (×4): qty 1

## 2014-05-27 MED ORDER — ACETAMINOPHEN 325 MG PO TABS: 650.0000 mg | ORAL_TABLET | Freq: Four times a day (QID) | ORAL | Status: DC | PRN

## 2014-05-27 SURGICAL SUPPLY — 53 items
BENZOIN TINCTURE PRP APPL 2/3 (GAUZE/BANDAGES/DRESSINGS) ×3 IMPLANT
BLADE SAW SGTL 18X1.27X75 (BLADE) ×2 IMPLANT
BLADE SAW SGTL 18X1.27X75MM (BLADE) ×1
CAPT HIP PF COP ×3 IMPLANT
CELLS DAT CNTRL 66122 CELL SVR (MISCELLANEOUS) ×1 IMPLANT
CLOSURE STERI-STRIP 1/2X4 (GAUZE/BANDAGES/DRESSINGS) ×1
CLSR STERI-STRIP ANTIMIC 1/2X4 (GAUZE/BANDAGES/DRESSINGS) ×2 IMPLANT
COVER PERINEAL POST (MISCELLANEOUS) ×3 IMPLANT
COVER SURGICAL LIGHT HANDLE (MISCELLANEOUS) ×3 IMPLANT
DRAPE C-ARM 42X72 X-RAY (DRAPES) ×3 IMPLANT
DRAPE IMP U-DRAPE 54X76 (DRAPES) ×3 IMPLANT
DRAPE STERI IOBAN 125X83 (DRAPES) ×3 IMPLANT
DRAPE U-SHAPE 47X51 STRL (DRAPES) ×6 IMPLANT
DRSG AQUACEL AG ADV 3.5X10 (GAUZE/BANDAGES/DRESSINGS) ×3 IMPLANT
DRSG MEPILEX BORDER 4X12 (GAUZE/BANDAGES/DRESSINGS) ×3 IMPLANT
DURAPREP 26ML APPLICATOR (WOUND CARE) ×3 IMPLANT
ELECT BLADE 4.0 EZ CLEAN MEGAD (MISCELLANEOUS)
ELECT CAUTERY BLADE 6.4 (BLADE) ×3 IMPLANT
ELECT REM PT RETURN 9FT ADLT (ELECTROSURGICAL) ×3
ELECTRODE BLDE 4.0 EZ CLN MEGD (MISCELLANEOUS) IMPLANT
ELECTRODE REM PT RTRN 9FT ADLT (ELECTROSURGICAL) ×1 IMPLANT
FACESHIELD WRAPAROUND (MASK) ×9 IMPLANT
GLOVE BIO SURGEON STRL SZ8 (GLOVE) ×15 IMPLANT
GLOVE BIOGEL PI IND STRL 8 (GLOVE) ×3 IMPLANT
GLOVE BIOGEL PI INDICATOR 8 (GLOVE) ×6
GOWN STRL REUS W/ TWL LRG LVL3 (GOWN DISPOSABLE) ×1 IMPLANT
GOWN STRL REUS W/ TWL XL LVL3 (GOWN DISPOSABLE) ×3 IMPLANT
GOWN STRL REUS W/TWL LRG LVL3 (GOWN DISPOSABLE) ×2
GOWN STRL REUS W/TWL XL LVL3 (GOWN DISPOSABLE) ×6
KIT BASIN OR (CUSTOM PROCEDURE TRAY) ×3 IMPLANT
KIT ROOM TURNOVER OR (KITS) ×3 IMPLANT
LINER BOOT UNIVERSAL DISP (MISCELLANEOUS) ×3 IMPLANT
MANIFOLD NEPTUNE II (INSTRUMENTS) ×3 IMPLANT
NS IRRIG 1000ML POUR BTL (IV SOLUTION) ×3 IMPLANT
PACK TOTAL JOINT (CUSTOM PROCEDURE TRAY) ×3 IMPLANT
PACK UNIVERSAL I (CUSTOM PROCEDURE TRAY) ×3 IMPLANT
PAD ARMBOARD 7.5X6 YLW CONV (MISCELLANEOUS) ×3 IMPLANT
RTRCTR WOUND ALEXIS 18CM MED (MISCELLANEOUS) ×3
STAPLER VISISTAT 35W (STAPLE) ×3 IMPLANT
SUT ETHIBOND NAB CT1 #1 30IN (SUTURE) ×9 IMPLANT
SUT MNCRL 3 0 VIOLET RB1 (SUTURE) ×1 IMPLANT
SUT MONOCRYL 3 0 RB1 (SUTURE) ×2
SUT VIC AB 0 CT1 27 (SUTURE)
SUT VIC AB 0 CT1 27XBRD ANBCTR (SUTURE) IMPLANT
SUT VIC AB 1 CT1 27 (SUTURE) ×2
SUT VIC AB 1 CT1 27XBRD ANBCTR (SUTURE) ×1 IMPLANT
SUT VIC AB 2-0 CT1 27 (SUTURE) ×4
SUT VIC AB 2-0 CT1 TAPERPNT 27 (SUTURE) ×2 IMPLANT
SUT VLOC 180 0 24IN GS25 (SUTURE) ×3 IMPLANT
TOWEL OR 17X24 6PK STRL BLUE (TOWEL DISPOSABLE) ×3 IMPLANT
TOWEL OR 17X26 10 PK STRL BLUE (TOWEL DISPOSABLE) ×6 IMPLANT
TRAY FOLEY CATH 14FR (SET/KITS/TRAYS/PACK) IMPLANT
WATER STERILE IRR 1000ML POUR (IV SOLUTION) ×6 IMPLANT

## 2014-05-27 NOTE — Anesthesia Postprocedure Evaluation (Signed)
  Anesthesia Post-op Note  Patient: [name] [name]  Procedure(s) Performed: Procedure(s): RIGHT TOTAL HIP ARTHROPLASTY ANTERIOR APPROACH (Right)  Patient Location: PACU  Anesthesia Type:Spinal  Level of Consciousness: awake, alert  and oriented  Airway and Oxygen Therapy: Patient Spontanous Breathing and Patient connected to nasal cannula oxygen  Post-op Pain: mild  Post-op Assessment: Post-op Vital signs reviewed, Patient's Cardiovascular Status Stable, Respiratory Function Stable, Patent Airway, No signs of Nausea or vomiting and Pain level controlled  Post-op Vital Signs: stable  Last Vitals:  Filed Vitals:   05/27/14 1345  BP:   Pulse:   Temp: 36.4 C  Resp:     Complications: No apparent anesthesia complications

## 2014-05-27 NOTE — Plan of Care (Signed)
Problem: Consults Goal: Diagnosis- Total Joint Replacement Outcome: Completed/Met Date Met:  05/27/14 Primary Total Hip Right  Problem: Phase I Progression Outcomes Goal: CMS/Neurovascular status WDL Outcome: Completed/Met Date Met:  05/27/14 Goal: Hemodynamically stable Outcome: Completed/Met Date Met:  05/27/14

## 2014-05-27 NOTE — Op Note (Signed)
PRE-OP DIAGNOSIS:  RIGHT HIP DEGENERATIVE JOINT DISEASE POST-OP DIAGNOSIS:  same PROCEDURE: RIGHT TOTAL HIP ARTHROPLASTY ANTERIOR APPROACH ANESTHESIA:  Spinal and MAC SURGEON:  Melrose Nakayama MD ASSISTANT:  Loni Dolly PA-C   INDICATIONS FOR PROCEDURE:  The patient is a 58 y.o. female with a long history of a painful hip.  This has persisted despite multiple conservative measures.  The patient has persisted with pain and dysfunction making rest and activity difficult.  A total hip replacement is offered as surgical treatment.  Informed operative consent was obtained after discussion of possible complications including reaction to anesthesia, infection, neurovascular injury, dislocation, DVT, PE, and death.  The importance of the postoperative rehab program to optimize result was stressed with the patient.  SUMMARY OF FINDINGS AND PROCEDURE:  Under general anesthesia through a anterior approach an the Hana table a right THR was performed.  The patient had severe degenerative change and good bone quality.  We used DePuy components to replace the hip and these were size KA 9 Corail femur capped with a +1 12mm ceramic hip ball.  On the acetabular side we used a size 48 Gription shell with a  plus 4 neutral polyethylene liner.  We did use a hole eliminator.  Loni Dolly PA-C assisted throughout and was invaluable to the completion of the case in that he helped position and retract while I performed the procedure.  He also closed simultaneously to help minimize OR time.  I used fluoroscopy throughout the case to check position of implants and leg lengths and read all of these views myself.  DESCRIPTION OF PROCEDURE:  The patient was taken to the OR suite where general anesthetic was applied.  The patient was then positioned on the Hana table supine.  All bony prominences were appropriately padded.  Prep and drape was then performed in normal sterile fashion.  The patient was given Kefzol preoperative  antibiotic and an appropriate time out was performed.  We then took an anterior approach to the right hip.  Dissection was taken through adipose to the tensor fascia lata fascia.  This structure was incised longitudinally and we dissected in the intermuscular interval just medial to this muscle.  Cobra retractors were placed superior and inferior to the femoral neck superficial to the capsule.  A capsular incision was then made and the retractors were placed along the femoral neck.  Xray was brought in to get a good level for the femoral neck cut which was made with an oscillating saw and osteotome.  The femoral head was removed with a corkscrew.  The acetabulum was exposed and some labral tissues were excised. Reaming was taken to the inside wall of the pelvis and sequentially up to 1 mm smaller than the actual component.  A trial of components was done and then the aforementioned acetabular shell was placed in appropriate tilt and anteversion confirmed by fluoroscopy. The liner was placed along with the hole eliminator and attention was turned to the femur.  The leg was brought down and over into adduction and the elevator bar was used to raise the femur up gently in the wound.  The piriformis was released with care taken to preserve the obturator internus attachment and all of the posterior capsule. The femur was reamed and then broached to the appropriate size.  A trial reduction was done and the aforementioned head and neck assembly gave [name] the best stability in extension with external rotation.  Leg lengths were felt to be about equal by  fluoroscopic exam.  The trial components were removed and the wound irrigated.  We then placed the femoral component in appropriate anteversion.  The head was applied to a dry stem neck and the hip again reduced.  It was again stable in the aforementioned position.  The would was irrigated again followed by re-approximation of anterior capsule with ethibond suture. Tensor  fascia was repaired with V-loc suture  followed by subcutaneous closure with #O and #2 undyed vicryl.  Skin was closed with a subQ stitch and steristrips followed by a sterile dressing.  EBL and IOF can be obtained from anesthesia records.  DISPOSITION:  The patient was extubated in the OR and taken to PACU in stable condition to be admitted to the Orthopedic Surgery for appropriate post-op care to include perioperative antibiotics and DVT prophylaxis.

## 2014-05-27 NOTE — Evaluation (Signed)
Physical Therapy Evaluation Patient Details Name: [name] [name] MRN: [id number] DOB: 01/12/1956 Today's Date: 05/27/2014   History of Present Illness  58 y.o. s/p right direct anterior THA.  Pt with significant PMHx of essential HTN, rectosigmoid CA s/p surgery, chemo, and radiation, depression/anxiety, neuromuscular d/o, peripheral neuropathy, and back surgery.  Clinical Impression  Pt is POD #0 and just finished walking with OT to the bathroom and to the chair.  PT assessed leg strength, history, and started exercise and total hip education.  Ice applied at the end of the session.  OT reports mobility and in-room gait to be min assist and since pt nauseated and 8/10 pain, deferred PT assessment of gait until tomorrow.  Pt is concerned that her tendency to externally rotate her right leg (does not do this on the left) will continue after she has had surgery and she wants to walk normally again.  Pt will likely progress well enough to return home with HHPT, RW, and husband's assist at discharge.  PT to follow acutely for deficits listed below.       Follow Up Recommendations Home health PT;Supervision for mobility/OOB    Equipment Recommendations  Rolling walker with 5" wheels    Recommendations for Other Services   NA    Precautions / Restrictions Precautions Precautions: None Precaution Comments: anterior hip-no precautions Restrictions Weight Bearing Restrictions: Yes RLE Weight Bearing: Weight bearing as tolerated      Mobility  Bed Mobility Overal bed mobility: Needs Assistance Bed Mobility: Supine to Sit     Supine to sit: Supervision        Transfers Overall transfer level: Needs assistance Equipment used: Rolling walker (2 wheeled) Transfers: Sit to/from Stand Sit to Stand: Min guard         General transfer comment: cues for hand placement/technique.                   Pertinent Vitals/Pain Pain Assessment: 0-10 Pain Score: 8  Pain Location:  right hip and thigh Pain Intervention(s): Limited activity within patient's tolerance;Monitored during session;Repositioned;Ice applied    Home Living Family/patient expects to be discharged to:: Private residence Living Arrangements: Spouse/significant other Available Help at Discharge: Family;Available 24 hours/day (spouse home with her this week) Type of Home: House Home Access: Stairs to enter Entrance Stairs-Rails: Right Entrance Stairs-Number of Steps: 4 Home Layout: Two level;Able to live on main level with bedroom/bathroom Home Equipment: Toilet riser (thinks she has chair to use for shower) Additional Comments: Pt reports she will only need to get down one step (no rail) to get into the den where they have set up her bed once home and into the house.     Prior Function Level of Independence: Independent with assistive device(s)         Comments: using cane        Extremity/Trunk Assessment   Upper Extremity Assessment: Defer to OT evaluation           Lower Extremity Assessment: RLE deficits/detail RLE Deficits / Details: right leg with normal post op pain and weakness.  Ankle DF 4/5, knee extension 3/5, hip flexion 2+/5 (limited by pain).     Cervical / Trunk Assessment: Other exceptions  Communication   Communication: No difficulties  Cognition Arousal/Alertness: Awake/alert Behavior During Therapy: WFL for tasks assessed/performed Overall Cognitive Status: Within Functional Limits for tasks assessed  General Comments General comments (skin integrity, edema, etc.): Started education re: WBAT status, ankle pumps for clot prevention, progression of PT, and no ROM restrictions.     Exercises Total Joint Exercises Ankle Circles/Pumps: AROM;Both;20 reps;Supine Quad Sets: AROM;Right;10 reps;Supine Heel Slides: AAROM;Right;10 reps;Supine      Assessment/Plan    PT Assessment Patient needs continued PT services  PT Diagnosis  Difficulty walking;Abnormality of gait;Generalized weakness;Acute pain   PT Problem List Decreased range of motion;Decreased strength;Decreased activity tolerance;Decreased balance;Decreased mobility;Decreased knowledge of use of DME;Pain  PT Treatment Interventions DME instruction;Gait training;Stair training;Functional mobility training;Therapeutic activities;Therapeutic exercise;Balance training;Neuromuscular re-education;Patient/family education;Modalities;Manual techniques   PT Goals (Current goals can be found in the Care Plan section) Acute Rehab PT Goals Patient Stated Goal: to decreases pain and walk more normally.  PT Goal Formulation: With patient Time For Goal Achievement: 06/03/14 Potential to Achieve Goals: Good    Frequency 7X/week           End of Session   Activity Tolerance: Patient limited by pain Patient left: in chair;with call bell/phone within reach;with nursing/sitter in room Nurse Communication: Mobility status;Patient requests pain meds         Time: 1610-1630 PT Time Calculation (min) (ACUTE ONLY): 20 min   Charges:   PT Evaluation $Initial PT Evaluation Tier I: 1 Procedure PT Treatments $Therapeutic Exercise: 8-22 mins        Emara Lichter B. Shyasia Funches, PT, DPT [phone]   05/27/2014, 4:43 PM

## 2014-05-27 NOTE — Anesthesia Preprocedure Evaluation (Addendum)
Anesthesia Evaluation  Patient identified by MRN, date of birth, ID band Patient awake    Reviewed: Allergy & Precautions, H&P , NPO status   Airway Mallampati: I  TM Distance: >3 FB Neck ROM: Full    Dental  (+) Dental Advisory Given, Teeth Intact   Pulmonary former smoker,          Cardiovascular hypertension, Pt. on medications     Neuro/Psych  Headaches, Anxiety Depression    GI/Hepatic   Endo/Other    Renal/GU      Musculoskeletal  (+) Arthritis -,   Abdominal   Peds  Hematology   Anesthesia Other Findings   Reproductive/Obstetrics                            Anesthesia Physical Anesthesia Plan  ASA: II  Anesthesia Plan: Spinal and MAC   Post-op Pain Management:    Induction: Intravenous  Airway Management Planned: Simple Face Mask  Additional Equipment:   Intra-op Plan:   Post-operative Plan:   Informed Consent: I have reviewed the patients History and Physical, chart, labs and discussed the procedure including the risks, benefits and alternatives for the proposed anesthesia with the patient or authorized representative who has indicated his/her understanding and acceptance.   Dental advisory given  Plan Discussed with: Anesthesiologist and Surgeon  Anesthesia Plan Comments:        Anesthesia Quick Evaluation

## 2014-05-27 NOTE — Evaluation (Signed)
Occupational Therapy Evaluation Patient Details Name: [name] [name] MRN: [id number] DOB: 1956/06/10 Today's Date: 05/27/2014    History of Present Illness 58 y.o. s/p right anterior THA.   Clinical Impression   Pt s/p above. Pt independent with ADLs, PTA. Feel pt will benefit from acute OT to increase independence with ADLs prior to d/c.    Follow Up Recommendations  No OT follow up;Supervision - Intermittent    Equipment Recommendations  None recommended by OT    Recommendations for Other Services       Precautions / Restrictions Precautions Precaution Comments: anterior hip-no precautions Restrictions Weight Bearing Restrictions: Yes RLE Weight Bearing: Weight bearing as tolerated      Mobility Bed Mobility Overal bed mobility: Needs Assistance Bed Mobility: Supine to Sit     Supine to sit: Supervision        Transfers Overall transfer level: Needs assistance Equipment used: Rolling walker (2 wheeled) Transfers: Sit to/from Stand Sit to Stand: Min guard         General transfer comment: cues for hand placement/technique.    Balance                                            ADL Overall ADL's : Needs assistance/impaired     Grooming: Wash/dry face;Oral care;Applying deodorant;Set up;Supervision/safety;Standing   Upper Body Bathing: Set up;Supervision/ safety;Standing           Lower Body Dressing: Sit to/from stand;Minimal assistance (with AE)    Toilet Transfer: Min guard;Ambulation;RW (3 in 1 over commode)   Toileting- Clothing Manipulation and Hygiene: Min guard;Sit to/from stand       Functional mobility during ADLs: Min guard;Rolling walker General ADL Comments: Educated on dressing technique. Educated on safety (safe shoewear, sitting for most of LB ADLs and having walker in front when pulling up LB clothing, rugs, use of bag on walker). Educated on shower transfer techniques-pt plans to sponge bathe for a few  days. educated on AE for LB ADLs and pt practiced with sockaid. Discussed d/c recommendation and pt asking about progression.     Vision                     Perception     Praxis      Pertinent Vitals/Pain Pain Assessment: 0-10 Pain Score: 9  Pain Location: right hip Pain Intervention(s): Repositioned;Monitored during session     Hand Dominance     Extremity/Trunk Assessment Upper Extremity Assessment Upper Extremity Assessment: Overall WFL for tasks assessed   Lower Extremity Assessment Lower Extremity Assessment: Defer to PT evaluation       Communication Communication Communication: No difficulties   Cognition Arousal/Alertness: Awake/alert Behavior During Therapy: WFL for tasks assessed/performed Overall Cognitive Status: Within Functional Limits for tasks assessed                     General Comments       Exercises       Shoulder Instructions      Home Living Family/patient expects to be discharged to:: Private residence Living Arrangements: Spouse/significant other Available Help at Discharge: Family;Available 24 hours/day (spouse home with her this week) Type of Home: House Home Access: Stairs to enter [employer] of Steps: 4 Entrance Stairs-Rails: Right Home Layout: Two level;Able to live on main level with bedroom/bathroom Alternate Level Stairs-Number of Steps:  flight Alternate Level Stairs-Rails: Right;Left;Can reach both Bathroom Shower/Tub: Tub/shower unit;Walk-in shower (upstairs)   Bathroom Toilet: Standard (sink close)     Home Equipment: Toilet riser (thinks she has chair to use for shower)          Prior Functioning/Environment Level of Independence: Independent with assistive device(s)        Comments: using cane    OT Diagnosis: Acute pain   OT Problem List: Decreased strength;Decreased range of motion;Decreased knowledge of use of DME or AE;Decreased knowledge of precautions;Pain   OT  Treatment/Interventions: Self-care/ADL training;DME and/or AE instruction;Therapeutic activities;Balance training;Patient/family education    OT Goals(Current goals can be found in the care plan section) Acute Rehab OT Goals Patient Stated Goal: not stated OT Goal Formulation: With patient Time For Goal Achievement: 06/03/14 Potential to Achieve Goals: Good ADL Goals Pt Will Perform Lower Body Dressing: with modified independence;sit to/from stand Pt Will Transfer to Toilet: with modified independence;ambulating;grab bars (comfort height)  OT Frequency: Min 2X/week   Barriers to D/C:            Co-evaluation              End of Session Equipment Utilized During Treatment: Gait belt;Rolling walker  Activity Tolerance: Patient tolerated treatment well Patient left: in chair;with call bell/phone within reach   Time: 3500-9381 OT Time Calculation (min): 27 min Charges:  OT General Charges $OT Visit: 1 Procedure OT Evaluation $Initial OT Evaluation Tier I: 1 Procedure OT Treatments $Self Care/Home Management : 8-22 mins G-CodesBenito Mccreedy OTR/L [phone] 05/27/2014, 4:24 PM

## 2014-05-27 NOTE — Transfer of Care (Signed)
Immediate Anesthesia Transfer of Care Note  Patient: [name] [name]  Procedure(s) Performed: Procedure(s): RIGHT TOTAL HIP ARTHROPLASTY ANTERIOR APPROACH (Right)  Patient Location: PACU  Anesthesia Type:Spinal  Level of Consciousness: awake, alert  and oriented  Airway & Oxygen Therapy: Patient Spontanous Breathing and Patient connected to nasal cannula oxygen  Post-op Assessment: Report given to PACU RN and Post -op Vital signs reviewed and stable  Post vital signs: Reviewed and stable  Complications: No apparent anesthesia complications

## 2014-05-27 NOTE — Progress Notes (Signed)
Utilization review completed.  

## 2014-05-27 NOTE — Anesthesia Procedure Notes (Addendum)
Spinal  Start time: 05/27/2014 7:35 AM End time: 05/27/2014 7:40 AM Staffing Performed by: anesthesiologist  Preanesthetic Checklist Completed: patient identified, site marked, surgical consent, pre-op evaluation, timeout performed, IV checked, risks and benefits discussed and monitors and equipment checked Spinal Block Patient position: sitting Prep: ChloraPrep Patient monitoring: heart rate, cardiac monitor, continuous pulse ox and blood pressure Approach: right paramedian Location: L3-4 Injection technique: single-shot Needle Needle type: Tuohy  Needle gauge: 22 G Needle length: 5 cm Assessment Sensory level: T8 Additional Notes 7.5 Mg 0.755 Marcaine injected easily  Procedure Name: MAC Date/Time: 05/27/2014 7:47 AM Performed by: Carola Frost Pre-anesthesia Checklist: Patient identified, Timeout performed, Emergency Drugs available, Suction available and Patient being monitored Oxygen Delivery Method: Simple face mask Intubation Type: IV induction Placement Confirmation: positive ETCO2 and breath sounds checked- equal and bilateral Dental Injury: Teeth and Oropharynx as per pre-operative assessment

## 2014-05-27 NOTE — Interval H&P Note (Signed)
History and Physical Interval Note:  05/27/2014 7:24 AM  [name] [name]  has presented today for surgery, with the diagnosis of right hip djd  The various methods of treatment have been discussed with the patient and family. After consideration of risks, benefits and other options for treatment, the patient has consented to  Procedure(s): RIGHT TOTAL HIP ARTHROPLASTY ANTERIOR APPROACH (Right) as a surgical intervention .  The patient's history has been reviewed, patient examined, no change in status, stable for surgery.  I have reviewed the patient's chart and labs.  Questions were answered to the patient's satisfaction.     [name]

## 2014-05-28 ENCOUNTER — Encounter (HOSPITAL_COMMUNITY): Payer: Self-pay | Admitting: Orthopaedic Surgery

## 2014-05-28 LAB — CBC
HCT: 27.2 % — ABNORMAL LOW (ref 36.0–46.0)
Hemoglobin: 9.3 g/dL — ABNORMAL LOW (ref 12.0–15.0)
MCH: 31.1 pg (ref 26.0–34.0)
MCHC: 34.2 g/dL (ref 30.0–36.0)
MCV: 91 fL (ref 78.0–100.0)
Platelets: 141 K/uL — ABNORMAL LOW (ref 150–400)
RBC: 2.99 MIL/uL — ABNORMAL LOW (ref 3.87–5.11)
RDW: 12.2 % (ref 11.5–15.5)
WBC: 6 K/uL (ref 4.0–10.5)

## 2014-05-28 LAB — BASIC METABOLIC PANEL
ANION GAP: 8 (ref 5–15)
BUN: 10 mg/dL (ref 6–23)
CALCIUM: 8.3 mg/dL — AB (ref 8.4–10.5)
CO2: 30 mEq/L (ref 19–32)
Chloride: 97 mEq/L (ref 96–112)
Creatinine, Ser: 0.67 mg/dL (ref 0.50–1.10)
GFR calc Af Amer: >90 mL/min (ref >90)
Glucose, Bld: 124 mg/dL — ABNORMAL HIGH (ref 70–99)
Potassium: 3.7 mEq/L (ref 3.7–5.3)
SODIUM: 135 meq/L — AB (ref 137–147)

## 2014-05-28 MED ORDER — BISACODYL 10 MG RE SUPP
10.0000 mg | Freq: Every day | RECTAL | Status: DC | PRN
  Administered 2014-05-28: 10 mg via RECTAL
  Filled 2014-05-28: qty 1

## 2014-05-28 MED ORDER — OXYCODONE-ACETAMINOPHEN 5-325 MG PO TABS
1.0000 | ORAL_TABLET | ORAL | Status: DC | PRN
  Administered 2014-05-28 (×3): 2 via ORAL
  Administered 2014-05-29 (×2): 1 via ORAL
  Administered 2014-05-29 – 2014-05-30 (×3): 2 via ORAL
  Filled 2014-05-28: qty 1
  Filled 2014-05-28 (×3): qty 2
  Filled 2014-05-28: qty 1
  Filled 2014-05-28: qty 2
  Filled 2014-05-28: qty 1
  Filled 2014-05-28: qty 2
  Filled 2014-05-28: qty 1

## 2014-05-28 MED ORDER — PROMETHAZINE HCL 25 MG/ML IJ SOLN
12.5000 mg | Freq: Four times a day (QID) | INTRAMUSCULAR | Status: DC | PRN
  Administered 2014-05-28 (×2): 25 mg via INTRAVENOUS
  Filled 2014-05-28 (×2): qty 1

## 2014-05-28 NOTE — Plan of Care (Signed)
Problem: Phase II Progression Outcomes Goal: Ambulates Outcome: Progressing

## 2014-05-28 NOTE — Progress Notes (Signed)
Physical Therapy Treatment Patient Details Name: [name] [name] MRN: [id number] DOB: Nov 30, 1955 Today's Date: 05/28/2014    History of Present Illness 58 y.o. s/p right direct anterior THA.  Pt with significant PMHx of essential HTN, rectosigmoid CA s/p surgery, chemo, and radiation, depression/anxiety, neuromuscular d/o, peripheral neuropathy, and back surgery.    PT Comments    Session focused on therapeutic exercise (AROM/AAROM), and ambulation to maximize independence and safety with mobility. Pt performed tasks well and is on track to be discharged home tomorrow with HHPT.  Follow Up Recommendations  Home health PT;Supervision for mobility/OOB     Equipment Recommendations  Rolling walker with 5" wheels    Recommendations for Other Services       Precautions / Restrictions Precautions Precautions: None Precaution Comments: anterior hip-no precautions Restrictions Weight Bearing Restrictions: Yes RLE Weight Bearing: Weight bearing as tolerated    Mobility  Bed Mobility Overal bed mobility: Independent Bed Mobility: Supine to Sit     Supine to sit: Supervision     General bed mobility comments: Cues for technique  Transfers Overall transfer level: Needs assistance Equipment used: Rolling walker (2 wheeled) Transfers: Sit to/from Stand Sit to Stand: Min guard Stand pivot transfers: Min guard       General transfer comment: VC to use hands to push up from bed when standing ;much improved balance at initial stand compared to this am  Ambulation/Gait Ambulation/Gait assistance: Min guard Ambulation Distance (Feet): 10 Feet Assistive device: Rolling walker (2 wheeled) Gait Pattern/deviations: Step-to pattern Gait velocity: Slow    Much improved step length and step height, with far less  Verbal and tactile cues needed    Stairs            Wheelchair Mobility    Modified Rankin (Stroke Patients Only)       Balance                                     Cognition Arousal/Alertness: Awake/alert Behavior During Therapy: WFL for tasks assessed/performed Overall Cognitive Status: Within Functional Limits for tasks assessed  Orientation Level:  ( Current Attention Level: Focused   Following Commands: Follows multi-step commands consistently Safety/Judgement:  Awareness: Intellectual   General Comments: Cognition was not an area of concern; much improved with pm session    Exercises Total Joint Exercises Gluteal Sets: AROM Heel Slides: AAROM Hip ABduction/ADduction: AROM Marching in Standing: AROM 10 reps throughout   General Comments        Pertinent Vitals/Pain Pain Assessment: 0-10 Pain Score: 8  Pain Location: R hip and thigh, mostly thigh; felt much better in chair  Pain Descriptors / Indicators: Cramping;Aching Pain Intervention(s): Monitored during session;Premedicated before session;Limited activity within patient's tolerance    Home Living                      Prior Function            PT Goals (current goals can now be found in the care plan section) Acute Rehab PT Goals Patient Stated Goal: to decreases pain and walk more normally.     Frequency       PT Plan Current plan remains appropriate    Co-evaluation             End of Session Equipment Utilized During Treatment: Gait belt Activity Tolerance: Patient tolerated treatment well Patient left: in chair;with call  bell/phone within reach;with chair alarm set     Time: 1533-1606 PT Time Calculation (min) (ACUTE ONLY): 33 min  Charges:    Therapeutic exercise  Gait training                   G Codes:      Elvera Bicker, [place] Acute Rehabilitation [phone]  Evans,Anthony 05/28/2014, 4:23 PM  Roney Marion, [place] Pager [phone] Office [phone]

## 2014-05-28 NOTE — Progress Notes (Signed)
Occupational Therapy Treatment Patient Details Name: [name] [name] MRN: [id number] DOB: [birth year] Today's Date: 05/28/2014    History of present illness 58 y.o. s/p right direct anterior THA.  Pt with significant PMHx of essential HTN, rectosigmoid CA s/p surgery, chemo, and radiation, depression/anxiety, neuromuscular d/o, peripheral neuropathy, and back surgery.   OT comments  Pt seen following PT session to address toilet transfers and ADLs. Pt presents with confusion, increased pain, and decreased balance. Pt continues to require acute OT to assist with ADLs to promote independence.    Follow Up Recommendations  No OT follow up;Supervision - Intermittent    Equipment Recommendations  None recommended by OT    Recommendations for Other Services      Precautions / Restrictions Precautions Precautions: None Precaution Comments: anterior hip-no precautions Restrictions Weight Bearing Restrictions: Yes RLE Weight Bearing: Weight bearing as tolerated       Mobility Bed Mobility               General bed mobility comments: Pt standing with PT when OT arrived.   Transfers Overall transfer level: Needs assistance Equipment used: Rolling walker (2 wheeled) Transfers: Sit to/from [facility] Sit to Stand: Min assist Stand pivot transfers: Mod assist       General transfer comment: cues for hand placement/technique.        ADL Overall ADL's : Needs assistance/impaired     Grooming: Set up;Sitting Grooming Details (indicate cue type and reason): Pt with decreased balance and requires UE support to maintain static standing.                  Toilet Transfer: Minimal assistance;Stand-pivot;BSC;RW           Functional mobility during ADLs: Moderate assistance;Rolling walker General ADL Comments: Pt presenting with confusion this AM and decreased mobility. Pt seen following PT session to address toilet transfer.                  Cognition  Arousal/Alertness: Awake/Alert (slightly lethargic) Behavior During Therapy: Impulsive;Restless Overall Cognitive Status: Impaired/Different from baseline Area of Impairment: Attention;Following commands;Safety/judgement;Awareness;Memory;Orientation Orientation Level:  (pt oriented at times, but not at others "I was walking up my) Current Attention Level: Sustained Memory: Decreased short-term memory (Difficulty attending to verbal and tactile cues.)  Following Commands: Follows one step commands with increased time Safety/Judgement: Decreased awareness of safety;Decreased awareness of deficits Awareness: Intellectual   General Comments: Pt confused during OT session. Pt reporting "I don't know why my leg hurts, maybe from walking up the stairs at home last night." However pt then able to state she was at [facility].                  Pertinent Vitals/ Pain       Pain Assessment: 0-10 Pain Score: 8  Pain Location: R hip and thigh Pain Descriptors / Indicators: Radiating;Sharp Pain Intervention(s): Limited activity within patient's tolerance;Repositioned         Frequency Min 2X/week     Progress Toward Goals  OT Goals(current goals can now be found in the care plan section)  Progress towards OT goals: Progressing toward goals (slowly due to confusion and pain)     Plan Discharge plan remains appropriate       End of Session Equipment Utilized During Treatment: Gait belt;Rolling walker   Activity Tolerance Patient limited by pain   Patient Left in chair;with call bell/phone within reach;Other (comment) (with PT present)   Nurse Communication  Time: 1010-1019 OT Time Calculation (min): 9 min  Charges: OT General Charges $OT Visit: 1 Procedure OT Treatments $Self Care/Home Management : 8-22 mins  Juluis Rainier 05/28/2014, 2:43 PM   Secundino Ginger Lynetta Mare, OTR/L Occupational Therapist [phone] (pager)

## 2014-05-28 NOTE — Progress Notes (Signed)
Physical Therapy Treatment Note  PT Comments: Session focused on increasing activity tolerance, transfer training, and ambulation. Pt demonstrated inefficient movement and decreased attention to cues during transfers and ambulation. Confusion, nausea/vomiting, and significantly decr attention  conttributed to impairments and functional limitations. Pt will continue to benefit from simple verbal cueing and maximal tactile cueing during transfers and gait. Pt will benefit from continued skilled PT to address functional limitations listed to maximize independence and safety with mobility.  Hypotensive after upright activity; Reclined pt and BP improved; RN notified   05/28/14 1049  PT Visit Information  Last PT Received On 05/28/14  Assistance Needed +2 (AM session - 3rd person helpful for chair.)  History of Present Illness 58 y.o. s/p right direct anterior THA.  Pt with significant PMHx of essential HTN, rectosigmoid CA s/p surgery, chemo, and radiation, depression/anxiety, neuromuscular d/o, peripheral neuropathy, and back surgery.  PT Time Calculation  PT Start Time (ACUTE ONLY) 0910  PT Stop Time (ACUTE ONLY) 1037 (minus approx 15 minutes for OT session, time on [facility])  PT Time Calculation (min) (ACUTE ONLY) 87 min    Precautions  Precautions Fall risk  Precaution Comments anterior hip-no precautions  Restrictions  Weight Bearing Restrictions Yes  RLE Weight Bearing WBAT  Pain Assessment  Pain Assessment 0-10  Pain Score 8  Pain Location right hip and thigh  Pain Descriptors / Indicators Radiating;Sharp  Pain Intervention(s) Limited activity within patient's tolerance;Monitored during session  Cognition  Arousal/Alertness Awake/alert (Confused during session, difficulty attending to cues. )  Behavior During Therapy Impulsive;Restless (Tried to stand without assist; chair alarm placed.)  Overall Cognitive Status Difficult to assess  Memory Decreased short-term memory (Difficulty  attending to verbal and tactile cues.)  Transfers  Overall transfer level Needs assistance  Equipment used Rolling walker (2 wheeled)  Transfers Sit to/from Stand  Sit to Stand Min assist  General transfer comment cues for hand placement/technique. Tends to have posterior lean at initial stand  Ambulation/Gait  Ambulation/Gait assistance Mod assist  Ambulation Distance (Feet) 25 Feet  Assistive device Rolling walker (2 wheeled)  Gait Pattern/deviations Step-to pattern;Decreased step length - left;Decreased stance time - right;Trunk flexed  Gait velocity Slow  Exercises  Exercises Total Joint  Total Joint Exercises  Hip ABduction/ADduction AAROM;Other (comment) (8 reps)  PT - End of Session  Equipment Utilized During Treatment Gait belt  Activity Tolerance Patient limited by pain  Patient left in chair;with call bell/phone within reach;with chair alarm set (No chair alarm box in room - communicated with nurse.)  Nurse Communication Mobility status;Other (comment) (Nurse to get box for chair alarm; Communicated BP reading.)  PT - Assessment/Plan  PT Plan Current plan remains appropriate  PT Frequency (ACUTE ONLY) 7X/week  Follow Up Recommendations Home health PT;Supervision for mobility/OOB  PT equipment Rolling walker with 5" wheels  Acute Rehab PT Goals  PT Goal Formulation With patient  Time For Goal Achievement 06/03/14  Potential to Achieve Goals Good  PT General Charges  $$ ACUTE PT VISIT 1 Procedure  PT Treatments  $Gait Training 23-37 mins  $Therapeutic Exercise 8-22 mins  $Therapeutic Activity 8-22 mins    Shawna Orleans, SPT Acute Rehabilitation Services [place], [place] Pager [phone] Office [phone]

## 2014-05-28 NOTE — Progress Notes (Addendum)
Patient was given Vicodin and Dilaudid for pain, within 30 minutes patient had nausea and vomiting.  She also reported mild dizziness.  Earlier today she had been given Zofran for nausea and it was reported that it made her nausea and vomiting much worse.  I called and got an order for Scopolamine patch.  Patient requested to sit in the recliner for a while until the nausea has calmed down. Patient tried to eat some jello and chicken broth, but could not keep this down.  She is now eating ice chips as needed because of the nausea and vomiting.

## 2014-05-28 NOTE — Progress Notes (Signed)
Subjective: 1 Day Post-Op Procedure(s) (LRB): RIGHT TOTAL HIP ARTHROPLASTY ANTERIOR APPROACH (Right)   Patient has had nausea and vomiting throughout the night.  It may be related to the Vicodin.  She also mentions that she has been constipated and has not had a BM for 3 or 4 days prior to coming to the hospital.  She states this has been a problem for her in the past and believes that some of her pain is coming from this.  Activity level:  wbat Diet tolerance:  clears Voiding:  Urinating ok but no BM for 3-4 days prior to coming to hospital. Patient reports pain as mild and moderate.    Objective: Vital signs in last 24 hours: Temp:  [97.4 F (36.3 C)-98.6 F (37 C)] 98.6 F (37 C) (11/18 0539) Pulse Rate:  [65-91] 73 (11/18 0539) Resp:  [10-33] 15 (11/18 0539) BP: (98-160)/(42-75) 135/54 mmHg (11/18 0539) SpO2:  [94 %-100 %] 96 % (11/18 0539)  Labs:  Recent Labs  05/28/14 0605  HGB 9.3*    Recent Labs  05/28/14 0605  WBC 6.0  RBC 2.99*  HCT 27.2*  PLT 141*    Recent Labs  05/28/14 0605  NA 135*  K 3.7  CL 97  CO2 30  BUN 10  CREATININE 0.67  GLUCOSE 124*  CALCIUM 8.3*   No results for input(s): LABPT, INR in the last 72 hours.  Physical Exam:  Neurologically intact ABD soft Neurovascular intact Sensation intact distally Intact pulses distally Dorsiflexion/Plantar flexion intact Incision: dressing C/D/I and no drainage No cellulitis present Compartment soft  Assessment/Plan:  1 Day Post-Op Procedure(s) (LRB): RIGHT TOTAL HIP ARTHROPLASTY ANTERIOR APPROACH (Right) Advance diet Up with therapy Plan for discharge tomorrow Discharge home with home health if doing well and cleared by PT. We will switch her from Vicodin to Percocet to see if that helps with the nausea.  We will also start some Phenergan to help with the nausea.  We are also going to give her some rectal Dulcolax to see if this helps her have a BM. She will continue on ASA 325 mg  twice a day for 4 weeks postop. She will follow up in the office 2 weeks postop.    Florette Thai, [name] 05/28/2014, 8:22 AM

## 2014-05-29 DIAGNOSIS — D62 Acute posthemorrhagic anemia: Secondary | ICD-10-CM | POA: Diagnosis not present

## 2014-05-29 LAB — CBC
HCT: 22.5 % — ABNORMAL LOW (ref 36.0–46.0)
Hemoglobin: 7.6 g/dL — ABNORMAL LOW (ref 12.0–15.0)
MCH: 30.9 pg (ref 26.0–34.0)
MCHC: 33.8 g/dL (ref 30.0–36.0)
MCV: 91.5 fL (ref 78.0–100.0)
PLATELETS: 127 10*3/uL — AB (ref 150–400)
RBC: 2.46 MIL/uL — ABNORMAL LOW (ref 3.87–5.11)
RDW: 12.4 % (ref 11.5–15.5)
WBC: 5.7 10*3/uL (ref 4.0–10.5)

## 2014-05-29 LAB — PREPARE RBC (CROSSMATCH)

## 2014-05-29 MED ORDER — SODIUM CHLORIDE 0.9 % IV SOLN: Freq: Once | INTRAVENOUS | Status: DC

## 2014-05-29 NOTE — Clinical Documentation Improvement (Signed)
Supporting Information: Per 11/17 Anesthesia record: EBL: 800 ml; Albumin: 250 ml; LR: 2000 ml.  Labs: H/H: Pre-op- 11/09: 14.3/42.3 11/18: 9.3/27.2 11/19: 7.6/22.5   Possible Diagnosis? --Acute Blood Loss --Acute on Chronic Blood Loss --Other (please specify) --Unable to determine . Include in documentation if Anemia is due to nutrition or mineral deficits, resulting in a nutritional anemia    Thank You, Russ Halo Documentation Specialist [phone] [email or web address]

## 2014-05-29 NOTE — Plan of Care (Signed)
Problem: Discharge Progression Outcomes Goal: Pain controlled with appropriate interventions Outcome: Progressing     

## 2014-05-29 NOTE — Progress Notes (Signed)
Physical Therapy Treatment Patient Details Name: [name] [name] MRN: [id number] DOB: Jul 16, 1955 Today's Date: 05/29/2014    History of Present Illness 58 y.o. s/p right direct anterior THA.  Pt with significant PMHx of essential HTN, rectosigmoid CA s/p surgery, chemo, and radiation, depression/anxiety, neuromuscular d/o, peripheral neuropathy, and back surgery.    PT Comments    Pt very motivated to progress mobility. Pt with HgB of 7.6 but asymptomatic throughout session. Pt hopeful to D/C home tomorrow. Patient needs to practice stairs next session prior to D/C.   Follow Up Recommendations  Home health PT;Supervision for mobility/OOB     Equipment Recommendations  Rolling walker with 5" wheels    Recommendations for Other Services       Precautions / Restrictions Precautions Precautions: None Precaution Comments: anterior hip-no precautions Restrictions Weight Bearing Restrictions: Yes RLE Weight Bearing: Weight bearing as tolerated    Mobility  Bed Mobility Overal bed mobility: Modified Independent             General bed mobility comments: pt up in chair and returned to chair  Transfers Overall transfer level: Needs assistance Equipment used: Rolling walker (2 wheeled) Transfers: Sit to/from Stand Sit to Stand: Min guard         General transfer comment: cues for technique; min guard to steady  Ambulation/Gait Ambulation/Gait assistance: Min guard Ambulation Distance (Feet): 100 Feet Assistive device: Rolling walker (2 wheeled) Gait Pattern/deviations: Shuffle;Decreased dorsiflexion - right;Decreased stance time - right;Decreased step length - left;Trunk flexed;Antalgic Gait velocity: Slow Gait velocity interpretation: Below normal speed for age/gender General Gait Details: initially pt with difficulty advancing Rt LE; cues for use of UEs to (A) with advancing Rt LE; min guard to steady; pt denied any dizziness with mobility   Stairs             Wheelchair Mobility    Modified Rankin (Stroke Patients Only)       Balance Overall balance assessment: Needs assistance Sitting-balance support: Feet supported;No upper extremity supported Sitting balance-Leahy Scale: Fair     Standing balance support: During functional activity;Bilateral upper extremity supported Standing balance-Leahy Scale: Poor Standing balance comment: pt relying on RW for balance                     Cognition Arousal/Alertness: Awake/alert Behavior During Therapy: WFL for tasks assessed/performed Overall Cognitive Status: Within Functional Limits for tasks assessed                 General Comments: Confusion has resolved.     Exercises Total Joint Exercises Ankle Circles/Pumps: AROM;15 reps Quad Sets: AROM;Right;10 reps;Seated Gluteal Sets: 10 reps (hold 5 second count) Heel Slides: AAROM;Right;10 reps;Seated Hip ABduction/ADduction: AAROM;Right;Strengthening;10 reps;Seated Long Arc Quad: AROM;Right;10 reps    General Comments        Pertinent Vitals/Pain Pain Assessment: 0-10 Pain Score: 6  Pain Location: Rt thigh  Pain Descriptors / Indicators: Sore;Shooting Pain Intervention(s): Monitored during session;Premedicated before session;Repositioned;Ice applied    Home Living                      Prior Function            PT Goals (current goals can now be found in the care plan section) Acute Rehab PT Goals Patient Stated Goal: to go home tomorrow PT Goal Formulation: With patient Time For Goal Achievement: 06/03/14 Potential to Achieve Goals: Good Progress towards PT goals: Progressing toward goals    Frequency  7X/week    PT Plan Current plan remains appropriate    Co-evaluation             End of Session Equipment Utilized During Treatment: Gait belt Activity Tolerance: Patient tolerated treatment well Patient left: in chair;with call bell/phone within reach     Time: 0942-1007 PT Time  Calculation (min) (ACUTE ONLY): 25 min  Charges:  $Gait Training: 8-22 mins $Therapeutic Exercise: 8-22 mins                    G CodesGustavus Bryant, [place]  [phone] 05/29/2014, 10:13 AM

## 2014-05-29 NOTE — Care Management Note (Signed)
CARE MANAGEMENT NOTE 05/29/2014  Patient:  LEZLEE, GILLS   Account Number:  [id number]  Date Initiated:  05/29/2014  Documentation initiated by:  Ricki Miller  Subjective/Objective Assessment:   58 yr old female admitted with right hip DJD. Patient had a right total hip arthroplasty.     Action/Plan:   Case manager spoke with patient concerning home health and DME needs. Preoperatively setup with [facility], no changes. patient states she has toilet riser. Has support at discharge.   Anticipated DC Date:  05/30/2014   Anticipated DC Plan:  [place] [facility]  CM consult      [facility] Choice  HOME HEALTH  DURABLE MEDICAL EQUIPMENT   Choice offered to / List presented to:  C-1 Patient   DME arranged  Vassie Moselle      DME agency  [place] arranged  [place].   Status of service:  Completed, signed off Medicare Important Message given?   (If response is "NO", the following Medicare IM given date fields will be blank) Date Medicare IM given:   Medicare IM given by:   Date Additional Medicare IM given:   Additional Medicare IM given by:    Discharge Disposition:  [place]  Per UR Regulation:  Reviewed for med. necessity/level of care/duration of stay  If discussed at [place] of Stay Meetings, dates discussed:    Comments:

## 2014-05-29 NOTE — Progress Notes (Signed)
Subjective: 2 Days Post-Op Procedure(s) (LRB): RIGHT TOTAL HIP ARTHROPLASTY ANTERIOR APPROACH (Right)   Patiens Hgb is 7.6 this morning and she is symptomatic. She is getting up and moving but has no energy. She reports that she had had some small BM's but still feels constipated.   Activity level:  wbat Diet tolerance:  Eating well Voiding:  Urinating with minimal BM Patient reports pain as mild and moderate.    Objective: Vital signs in last 24 hours: Temp:  [98.1 F (36.7 C)-98.8 F (37.1 C)] 98.3 F (36.8 C) (11/19 0554) Pulse Rate:  [75-103] 75 (11/19 0554) Resp:  [18] 18 (11/19 0000) BP: (89-142)/(43-92) 89/43 mmHg (11/19 0554) SpO2:  [96 %-100 %] 96 % (11/19 0554)  Labs:  Recent Labs  05/28/14 0605 05/29/14 0625  HGB 9.3* 7.6*    Recent Labs  05/28/14 0605 05/29/14 0625  WBC 6.0 5.7  RBC 2.99* 2.46*  HCT 27.2* 22.5*  PLT 141* 127*    Recent Labs  05/28/14 0605  NA 135*  K 3.7  CL 97  CO2 30  BUN 10  CREATININE 0.67  GLUCOSE 124*  CALCIUM 8.3*   No results for input(s): LABPT, INR in the last 72 hours.  Physical Exam:  Neurologically intact ABD soft Neurovascular intact Sensation intact distally Intact pulses distally Dorsiflexion/Plantar flexion intact Incision: dressing C/D/I No cellulitis present Compartment soft  Assessment/Plan:  2 Days Post-Op Procedure(s) (LRB): RIGHT TOTAL HIP ARTHROPLASTY ANTERIOR APPROACH (Right) Advance diet Up with therapy Plan for discharge tomorrow Discharge home with home health if doing well and cleared by PT. We will transfuse 2 units of blood for her anemia. Continue on asas 325 mg BID x 4 weeks post op.  Follow up in office 2 weeks post op.  Continue current treatment for constipation as patient states that it is beginning to work.     [name] [name], [name] 05/29/2014, 12:53 PM

## 2014-05-29 NOTE — Progress Notes (Signed)
Occupational Therapy Treatment Patient Details Name: [name] [name] MRN: [id number] DOB: 01/23/1956 Today's Date: 05/29/2014    History of present illness 58 y.o. s/p right direct anterior THA.  Pt with significant PMHx of essential HTN, rectosigmoid CA s/p surgery, chemo, and radiation, depression/anxiety, neuromuscular d/o, peripheral neuropathy, and back surgery.   OT comments  Pt seen today for ADL session. Pt performed bathing and grooming tasks in bathroom with assistance. Pt continues to be limited in LB ADLs due to pain and decreased ROM. Pt will continue to benefit from acute OT to address independence with ADLs.   Follow Up Recommendations  No OT follow up;Supervision - Intermittent    Equipment Recommendations  3 in 1 bedside comode    Recommendations for Other Services      Precautions / Restrictions Precautions Precautions: None Precaution Comments: anterior hip-no precautions Restrictions Weight Bearing Restrictions: Yes RLE Weight Bearing: Weight bearing as tolerated       Mobility Bed Mobility Overal bed mobility: Modified Independent             General bed mobility comments: Pt requires increased time.  Transfers Overall transfer level: Needs assistance Equipment used: Rolling walker (2 wheeled) Transfers: Sit to/from Stand Sit to Stand: Min guard         General transfer comment: VC's for hand placement ~50% of the time.         ADL Overall ADL's : Needs assistance/impaired     Grooming: Supervision/safety;Set up;Standing Grooming Details (indicate cue type and reason): Pt stood at sink for grooming tasks.  Upper Body Bathing: Set up;Supervision/ safety;Sitting   Lower Body Bathing: Minimal assistance;Sit to/from stand       Lower Body Dressing: Sit to/from stand;Minimal assistance Lower Body Dressing Details (indicate cue type and reason): Pt was able to remove socks independently, however required assist to don.  Toilet Transfer:  Min guard;Ambulation;RW (BSC over toilet)   Toileting- Clothing Manipulation and Hygiene: Min guard;Sit to/from stand       Functional mobility during ADLs: Min guard;Rolling walker General ADL Comments: Pt making progress with ADLs and functional mobility, however reported to PA (by phone) that she feels unsteady and would like another night here and OT agrees with this.                 Cognition  Arousal/Alertness: Awake/Alert Behavior During Therapy: WFL for tasks assessed/performed Overall Cognitive Status: Within Functional Limits for tasks assessed                  General Comments: Confusion has resolved.                  Pertinent Vitals/ Pain       Pain Assessment: 0-10 Pain Score: 6  Pain Location: R hip and thigh Pain Descriptors / Indicators: Aching;Sore Pain Intervention(s): Monitored during session;Repositioned         Frequency Min 2X/week     Progress Toward Goals  OT Goals(current goals can now be found in the care plan section)  Progress towards OT goals: Progressing toward goals     Plan Discharge plan remains appropriate       End of Session Equipment Utilized During Treatment: Gait belt;Rolling walker   Activity Tolerance Patient tolerated treatment well   Patient Left in chair;with call bell/phone within reach   Nurse Communication          Time: 7209-4709 OT Time Calculation (min): 38 min  Charges: OT General Charges $OT  Visit: 1 Procedure OT Treatments $Self Care/Home Management : 38-52 mins  Juluis Rainier 05/29/2014, 10:12 AM   Cyndie Chime, OTR/L Occupational Therapist [phone] (pager)

## 2014-05-29 NOTE — Progress Notes (Signed)
Physical Therapy Treatment Patient Details Name: [name] [name] MRN: [id number] DOB: 07-10-1956 Today's Date: 05/29/2014    History of Present Illness 58 y.o. s/p right direct anterior THA.  Pt with significant PMHx of essential HTN, rectosigmoid CA s/p surgery, chemo, and radiation, depression/anxiety, neuromuscular d/o, peripheral neuropathy, and back surgery.    PT Comments    Pt progressing well with therapy today. Denied any dizziness with mobility during session but c/o intermittent bouts of nausea and dizziness throughout the day. Patient needs to practice stairs next session prior to D/C home.   Follow Up Recommendations  Home health PT;Supervision for mobility/OOB     Equipment Recommendations  Rolling walker with 5" wheels    Recommendations for Other Services       Precautions / Restrictions Precautions Precautions: None Precaution Comments: anterior hip-no precautions Restrictions Weight Bearing Restrictions: Yes RLE Weight Bearing: Weight bearing as tolerated    Mobility  Bed Mobility Overal bed mobility: Modified Independent Bed Mobility: Supine to Sit;Sit to Supine     Supine to sit: Supervision Sit to supine: Supervision   General bed mobility comments: cues for technique; incr time but no physical (A) needed  Transfers Overall transfer level: Needs assistance Equipment used: Rolling walker (2 wheeled) Transfers: Sit to/from Stand Sit to Stand: Min guard         General transfer comment: min guard due to intermittent bouts of dizziness; pt demo good technique with pushup from different surfaces   Ambulation/Gait Ambulation/Gait assistance: Supervision Ambulation Distance (Feet): 125 Feet Assistive device: Rolling walker (2 wheeled) Gait Pattern/deviations: Step-through pattern;Decreased step length - left;Decreased stance time - right;Trunk flexed;Antalgic Gait velocity: Slow Gait velocity interpretation: Below normal speed for  age/gender General Gait Details: pt progressing to step through gt with incr time and multimodal cues; denied any dizziness with ambulation this session    Stairs Stairs:  (planned for next session due to intermittent dizziness)          Wheelchair Mobility    Modified Rankin (Stroke Patients Only)       Balance Overall balance assessment: Needs assistance Sitting-balance support: Feet supported;No upper extremity supported Sitting balance-Leahy Scale: Good     Standing balance support: During functional activity;Bilateral upper extremity supported Standing balance-Leahy Scale: Poor Standing balance comment: relying on RW for balance                     Cognition Arousal/Alertness: Awake/alert Behavior During Therapy: WFL for tasks assessed/performed Overall Cognitive Status: Within Functional Limits for tasks assessed                      Exercises Total Joint Exercises Ankle Circles/Pumps: 10 reps Quad Sets: AROM;Right;10 reps;Supine Gluteal Sets: 10 reps Short Arc Quad: AROM;Strengthening;Right;10 reps;Supine Hip ABduction/ADduction: Right;Strengthening;10 reps;Seated;AROM Long Arc Quad: AROM;Right;10 reps Marching in Standing: AROM;Right;10 reps;Seated    General Comments        Pertinent Vitals/Pain Pain Assessment: 0-10 Pain Score: 4  Pain Location: Rt anterior thigh Pain Descriptors / Indicators: Sore Pain Intervention(s): Monitored during session;Ice applied;Premedicated before session;Repositioned    Home Living                      Prior Function            PT Goals (current goals can now be found in the care plan section) Acute Rehab PT Goals Patient Stated Goal: to go home tomorrow  PT Goal Formulation: With patient  Time For Goal Achievement: 06/03/14 Potential to Achieve Goals: Good Progress towards PT goals: Progressing toward goals    Frequency  7X/week    PT Plan Current plan remains appropriate     Co-evaluation             End of Session Equipment Utilized During Treatment: Gait belt Activity Tolerance: Patient tolerated treatment well Patient left: in bed;with call bell/phone within reach     Time: 1431-1455 PT Time Calculation (min) (ACUTE ONLY): 24 min  Charges:  $Gait Training: 8-22 mins $Therapeutic Exercise: 8-22 mins                    G CodesGustavus Bryant, [place] 05/29/2014, 4:01 PM

## 2014-05-30 LAB — CBC
HCT: 28.7 % — ABNORMAL LOW (ref 36.0–46.0)
Hemoglobin: 9.7 g/dL — ABNORMAL LOW (ref 12.0–15.0)
MCH: 29.8 pg (ref 26.0–34.0)
MCHC: 33.8 g/dL (ref 30.0–36.0)
MCV: 88 fL (ref 78.0–100.0)
Platelets: 119 10*3/uL — ABNORMAL LOW (ref 150–400)
RBC: 3.26 MIL/uL — AB (ref 3.87–5.11)
RDW: 13.2 % (ref 11.5–15.5)
WBC: 5.5 10*3/uL (ref 4.0–10.5)

## 2014-05-30 LAB — TYPE AND SCREEN
ABO/RH(D): O POS
Antibody Screen: NEGATIVE
UNIT DIVISION: 0
Unit division: 0

## 2014-05-30 MED ORDER — ASPIRIN 325 MG PO TBEC: 325.0000 mg | DELAYED_RELEASE_TABLET | Freq: Two times a day (BID) | ORAL | Status: DC

## 2014-05-30 MED ORDER — OXYCODONE-ACETAMINOPHEN 5-325 MG PO TABS: 1.0000 | ORAL_TABLET | ORAL | Status: DC | PRN

## 2014-05-30 MED ORDER — METHOCARBAMOL 500 MG PO TABS: 500.0000 mg | ORAL_TABLET | Freq: Four times a day (QID) | ORAL | Status: DC | PRN

## 2014-05-30 NOTE — Progress Notes (Signed)
Physical Therapy Treatment Patient Details Name: VANISHA WHITEN MRN: [id number] DOB: 12/24/1955 Today's Date: 05/30/2014    History of Present Illness 58 y.o. s/p right direct anterior THA.  Pt with significant PMHx of essential HTN, rectosigmoid CA s/p surgery, chemo, and radiation, depression/anxiety, neuromuscular d/o, peripheral neuropathy, and back surgery.    PT Comments    Session addressed stair management technique and reviewed HEP. Pt safe from mobility standpoint to D/C home today. Only one session required at this time, all questions answered regarding mobility and safety at home.   Follow Up Recommendations  Home health PT;Supervision for mobility/OOB     Equipment Recommendations  Rolling walker with 5" wheels    Recommendations for Other Services       Precautions / Restrictions Precautions Precautions: None Precaution Comments: anterior hip-no precautions Restrictions Weight Bearing Restrictions: Yes RLE Weight Bearing: Weight bearing as tolerated    Mobility  Bed Mobility Overal bed mobility: Modified Independent             General bed mobility comments: incr time but no physical (A) needed  Transfers Overall transfer level: Needs assistance Equipment used: Rolling walker (2 wheeled) Transfers: Sit to/from Stand Sit to Stand: Supervision         General transfer comment: supervision for cues for hand placement only  Ambulation/Gait Ambulation/Gait assistance: Modified independent (Device/Increase time) Ambulation Distance (Feet): 300 Feet Assistive device: Rolling walker (2 wheeled) Gait Pattern/deviations: Step-through pattern;Decreased stride length;Narrow base of support Gait velocity: decr/guarded Gait velocity interpretation: Below normal speed for age/gender General Gait Details: cues for safety with RW; good step through gt; guarded gt speed due to soreness   Stairs Stairs: Yes Stairs assistance: Min guard Stair Management:  One rail Right;One rail Left;Step to pattern;Sideways;Forwards Number of Stairs: 9 General stair comments: multimodal cues for multiple stair management strategies   Wheelchair Mobility    Modified Rankin (Stroke Patients Only)       Balance           Standing balance support: During functional activity Standing balance-Leahy Scale: Fair Standing balance comment: stood without UE support for shrot bouts                     Cognition Arousal/Alertness: Awake/alert Behavior During Therapy: WFL for tasks assessed/performed Overall Cognitive Status: Within Functional Limits for tasks assessed                      Exercises Total Joint Exercises Ankle Circles/Pumps: 15 reps Quad Sets: AROM;Right;10 reps;Supine Hip ABduction/ADduction: Right;Strengthening;10 reps;Seated;AROM Long Arc Quad: AROM;Right;10 reps Knee Flexion: AROM;Right;10 reps;Standing    General Comments General comments (skin integrity, edema, etc.): educated on car transfer technique ; reviewed HEP handout       Pertinent Vitals/Pain Pain Assessment: 0-10 Pain Score: 2  Pain Location: Rt thigh Pain Descriptors / Indicators: Sore Pain Intervention(s): Monitored during session;Premedicated before session;Repositioned;Ice applied    Home Living                      Prior Function            PT Goals (current goals can now be found in the care plan section) Acute Rehab PT Goals Patient Stated Goal: home today PT Goal Formulation: With patient Time For Goal Achievement: 06/03/14 Potential to Achieve Goals: Good Progress towards PT goals: Progressing toward goals    Frequency  7X/week    PT Plan Current plan remains  appropriate    Co-evaluation             End of Session Equipment Utilized During Treatment: Gait belt Activity Tolerance: Patient tolerated treatment well Patient left: in bed;with call bell/phone within reach;with nursing/sitter in room      Time: 0851-0919 PT Time Calculation (min) (ACUTE ONLY): 28 min  Charges:  $Gait Training: 8-22 mins $Therapeutic Exercise: 8-22 mins                    G CodesGustavus Bryant, Carinne Brandenburger [place] 05/30/2014, 10:01 AM

## 2014-05-30 NOTE — Discharge Summary (Signed)
Patient ID: [name] [name] MRN: [id number] DOB/AGE: February 06, 1956 58 y.o.  Admit date: 05/27/2014 Discharge date: 05/30/2014  Admission Diagnoses:  Principal Problem:   Primary osteoarthritis of right hip Active Problems:   Postoperative anemia due to acute blood loss   Discharge Diagnoses:  Same  Past Medical History  Diagnosis Date  . Essential hypertension, benign   . Knee pain   . Ganglion cyst   . Heart murmur   . Neuromuscular disorder   . Depression   . Anxiety   . High cholesterol   . Rectosigmoid cancer 2004     S/P Surgery, Chemo, Rad Tx,  (after chemo treatment menstral periods have ceased).  . Arthritis     "right hip; left hip; most of my joints" (05/27/2014)    Surgeries: Procedure(s): RIGHT TOTAL HIP ARTHROPLASTY ANTERIOR APPROACH on 05/27/2014   Consultants:    Discharged Condition: Improved  Hospital Course: ERNESTYNE CALDWELL is an 58 y.o. female who was admitted 05/27/2014 for operative treatment ofPrimary osteoarthritis of right hip. Patient has severe unremitting pain that affects sleep, daily activities, and work/hobbies. After pre-op clearance the patient was taken to the operating room on 05/27/2014 and underwent  Procedure(s): RIGHT TOTAL HIP ARTHROPLASTY ANTERIOR APPROACH.    Patient was given perioperative antibiotics: Anti-infectives    Start     Dose/Rate Route Frequency Ordered Stop   05/27/14 1600  ceFAZolin (ANCEF) IVPB 2 g/50 mL premix     2 g100 mL/hr over 30 Minutes Intravenous Every 6 hours 05/27/14 1458 05/27/14 2204   05/27/14 0600  ceFAZolin (ANCEF) IVPB 2 g/50 mL premix     2 g100 mL/hr over 30 Minutes Intravenous On call to O.R. 05/26/14 1503 05/27/14 0741       Patient was given sequential compression devices, early ambulation, and chemoprophylaxis to prevent DVT.  Patient had symptomatic postoperative acute blood loss anemia. She was given 2 units of blood and was feeling much better and symptom free the next morning.    Patient benefited maximally from hospital stay and there were no complications.    Recent vital signs: Patient Vitals for the past 24 hrs:  BP Temp Temp src Pulse Resp SpO2  05/30/14 0650 (!) 117/58 mmHg 98.3 F (36.8 C) Oral 73 16 98 %  05/30/14 0225 (!) 114/48 mmHg 98.4 F (36.9 C) Oral 95 18 95 %  05/30/14 0146 (!) 121/56 mmHg 98.6 F (37 C) Oral 80 16 99 %  05/30/14 0014 (!) 102/50 mmHg 98.7 F (37.1 C) Oral 75 18 95 %  05/29/14 2345 (!) 111/42 mmHg 98.8 F (37.1 C) Oral 66 18 96 %  05/29/14 2020 134/61 mmHg 98.3 F (36.8 C) Oral 75 18 98 %  05/29/14 1740 (!) 109/51 mmHg 99.4 F (37.4 C) Oral 95 16 98 %  05/29/14 1721 (!) 123/50 mmHg 98.6 F (37 C) Oral 96 18 96 %  05/29/14 1300 - 98.6 F (37 C) Oral 77 18 98 %     Recent laboratory studies:  Recent Labs  05/28/14 0605 05/29/14 0625 05/30/14 0657  WBC 6.0 5.7 5.5  HGB 9.3* 7.6* 9.7*  HCT 27.2* 22.5* 28.7*  PLT 141* 127* 119*  NA 135*  --   --   K 3.7  --   --   CL 97  --   --   CO2 30  --   --   BUN 10  --   --   CREATININE 0.67  --   --  GLUCOSE 124*  --   --   CALCIUM 8.3*  --   --      Discharge Medications:     Medication List    STOP taking these medications        diclofenac 75 MG EC tablet  Commonly known as:  VOLTAREN     meloxicam 15 MG tablet  Commonly known as:  MOBIC     traMADol 50 MG tablet  Commonly known as:  ULTRAM      TAKE these medications        ALPRAZolam 1 MG tablet  Commonly known as:  XANAX  TAKE 1/2 TO 1 TABLET AT BEDTIME AS NEEDED FOR SLEEP     aspirin 325 MG EC tablet  Take 1 tablet (325 mg total) by mouth 2 (two) times daily after a meal.     DULoxetine 30 MG capsule  Commonly known as:  CYMBALTA  Take 1 capsule (30 mg total) by mouth daily.     ergocalciferol 50000 UNITS capsule  Commonly known as:  DRISDOL  Take 1 capsule (50,000 Units total) by mouth once a week.     lisinopril-hydrochlorothiazide 20-12.5 MG per tablet  Commonly known as:   PRINZIDE,ZESTORETIC  Take 2 tablets by mouth daily.     methocarbamol 500 MG tablet  Commonly known as:  ROBAXIN  Take 1 tablet (500 mg total) by mouth every 6 (six) hours as needed for muscle spasms.     oxyCODONE-acetaminophen 5-325 MG per tablet  Commonly known as:  PERCOCET/ROXICET  Take 1-2 tablets by mouth every 4 (four) hours as needed for moderate pain.     pravastatin 80 MG tablet  Commonly known as:  PRAVACHOL  Take 80 mg by mouth daily.        Diagnostic Studies: Dg Chest 2 View  05/19/2014   CLINICAL DATA:  Pre-admission hip replacement  EXAM: CHEST  2 VIEW  COMPARISON:  Radiograph 04/17/2012  FINDINGS: Normal mediastinum and cardiac silhouette. Normal pulmonary vasculature. No evidence of effusion, infiltrate, or pneumothorax. No acute bony abnormality. Hyperinflated lungs.  IMPRESSION: No acute cardiopulmonary process.  Hyperinflated lungs.   Electronically Signed   By: Suzy Bouchard M.D.   On: 05/19/2014 14:25   Dg Hip Operative Right  05/27/2014   CLINICAL DATA:  Degenerative joint disease of right hip.  EXAM: DG OPERATIVE of HIP 1-2 VIEWS  TECHNIQUE: Fluoroscopic spot image(s) were submitted for interpretation post-operatively.  COMPARISON:  None.  FINDINGS: Two intraoperative fluoroscopic images of the right hip demonstrate the patient the status post right total hip arthroplasty. The femoral and acetabular components appear to be well situated.  IMPRESSION: Status post right total hip arthroplasty.   Electronically Signed   By: Sabino Dick M.D.   On: 05/27/2014 10:59    Disposition:       Discharge Instructions    Call MD / Call 911    Complete by:  As directed   If you experience chest pain or shortness of breath, CALL 911 and be transported to the hospital emergency room.  If you develope a fever above 101 F, pus (white drainage) or increased drainage or redness at the wound, or calf pain, call your surgeon's office.     Constipation Prevention    Complete  by:  As directed   Drink plenty of fluids.  Prune juice may be helpful.  You may use a stool softener, such as Colace (over the counter) 100 mg twice a day.  Use  MiraLax (over the counter) for constipation as needed.     Diet - low sodium heart healthy    Complete by:  As directed      Increase activity slowly as tolerated    Complete by:  As directed            Follow-up Information    Follow up with Hessie Dibble, MD. Call in 2 weeks.   Specialty:  Orthopedic Surgery   Contact information:   [place] [address] [phone]       Follow up with [facility].   Why:  Someone from [facility] will contact you concerning start date and time for physical therapy.    Contact information:   [address] [place] [address] [phone]        Signed: Rich Fuchs 05/30/2014, 8:08 AM

## 2014-05-30 NOTE — Progress Notes (Signed)
Subjective: 3 Days Post-Op Procedure(s) (LRB): RIGHT TOTAL HIP ARTHROPLASTY ANTERIOR APPROACH (Right)   Patient feels much better after receiving 2 units of blood. Her nausea is also gone. She had had only small BM's  Activity level:  wbat Diet tolerance:  Eating well Voiding:  Urinating with small BM's Patient reports pain as mild.    Objective: Vital signs in last 24 hours: Temp:  [98.3 F (36.8 C)-99.4 F (37.4 C)] 98.3 F (36.8 C) (11/20 0650) Pulse Rate:  [66-96] 73 (11/20 0650) Resp:  [16-18] 16 (11/20 0650) BP: (102-134)/(42-61) 117/58 mmHg (11/20 0650) SpO2:  [95 %-99 %] 98 % (11/20 0650)  Labs:  Recent Labs  05/28/14 0605 05/29/14 0625 05/30/14 0657  HGB 9.3* 7.6* 9.7*    Recent Labs  05/29/14 0625 05/30/14 0657  WBC 5.7 5.5  RBC 2.46* 3.26*  HCT 22.5* 28.7*  PLT 127* 119*    Recent Labs  05/28/14 0605  NA 135*  K 3.7  CL 97  CO2 30  BUN 10  CREATININE 0.67  GLUCOSE 124*  CALCIUM 8.3*   No results for input(s): LABPT, INR in the last 72 hours.  Physical Exam:  Neurologically intact ABD soft Neurovascular intact Sensation intact distally Intact pulses distally Dorsiflexion/Plantar flexion intact Incision: dressing C/D/I No cellulitis present Compartment soft  Assessment/Plan:  3 Days Post-Op Procedure(s) (LRB): RIGHT TOTAL HIP ARTHROPLASTY ANTERIOR APPROACH (Right) Advance diet Up with therapy Discharge home with home health today after PT. Patient doing much better after blood given. Continue ASA 325 mg BID x 4 weeks post op.  Follow up in office 2 weeks postop.  Patient states she has plenty of stool softeners at home and other constipation meds so she does not need an Rx. She stats she will call if she needs an Rx for nausea.    Gianelle Mccaul, [name] 05/30/2014, 8:11 AM

## 2014-06-20 ENCOUNTER — Telehealth: Payer: Self-pay | Admitting: *Deleted

## 2014-06-20 NOTE — Telephone Encounter (Signed)
Faxed signed order to [facility], per Dr Leward Quan. Confirmation page received at 1:45 pm.

## 2014-06-30 ENCOUNTER — Encounter: Payer: Self-pay | Admitting: Family Medicine

## 2014-07-06 ENCOUNTER — Other Ambulatory Visit: Payer: Self-pay | Admitting: Family Medicine

## 2014-07-07 ENCOUNTER — Encounter: Payer: Self-pay | Admitting: Family Medicine

## 2014-07-31 ENCOUNTER — Encounter: Payer: Self-pay | Admitting: Family Medicine

## 2014-08-12 ENCOUNTER — Encounter: Payer: Self-pay | Admitting: Internal Medicine

## 2014-09-28 ENCOUNTER — Other Ambulatory Visit: Payer: Self-pay | Admitting: Family Medicine

## 2014-11-10 ENCOUNTER — Other Ambulatory Visit: Payer: Self-pay | Admitting: Family Medicine

## 2014-11-11 ENCOUNTER — Encounter: Payer: Self-pay | Admitting: Internal Medicine

## 2014-11-14 ENCOUNTER — Ambulatory Visit (INDEPENDENT_AMBULATORY_CARE_PROVIDER_SITE_OTHER): Payer: BLUE CROSS/BLUE SHIELD | Admitting: Family Medicine

## 2014-11-14 ENCOUNTER — Encounter: Payer: Self-pay | Admitting: Family Medicine

## 2014-11-14 VITALS — BP 144/85 | HR 94 | Temp 98.3°F | Resp 18 | Ht 63.0 in | Wt 153.2 lb

## 2014-11-14 DIAGNOSIS — L659 Nonscarring hair loss, unspecified: Secondary | ICD-10-CM | POA: Diagnosis not present

## 2014-11-14 DIAGNOSIS — Z130 Encounter for screening for diseases of the blood and blood-forming organs and certain disorders involving the immune mechanism: Secondary | ICD-10-CM

## 2014-11-14 DIAGNOSIS — D62 Acute posthemorrhagic anemia: Secondary | ICD-10-CM | POA: Diagnosis not present

## 2014-11-14 DIAGNOSIS — E559 Vitamin D deficiency, unspecified: Secondary | ICD-10-CM | POA: Diagnosis not present

## 2014-11-14 DIAGNOSIS — F419 Anxiety disorder, unspecified: Secondary | ICD-10-CM | POA: Diagnosis not present

## 2014-11-14 LAB — CBC WITH DIFFERENTIAL/PLATELET
BASOS ABS: 0 10*3/uL (ref 0.0–0.1)
Basophils Relative: 0 % (ref 0–1)
Eosinophils Absolute: 0.1 10*3/uL (ref 0.0–0.7)
Eosinophils Relative: 1 % (ref 0–5)
HCT: 40.8 % (ref 36.0–46.0)
Hemoglobin: 13.9 g/dL (ref 12.0–15.0)
Lymphocytes Relative: 32 % (ref 12–46)
Lymphs Abs: 1.9 10*3/uL (ref 0.7–4.0)
MCH: 30.2 pg (ref 26.0–34.0)
MCHC: 34.1 g/dL (ref 30.0–36.0)
MCV: 88.7 fL (ref 78.0–100.0)
MPV: 9.3 fL (ref 8.6–12.4)
Monocytes Absolute: 0.6 10*3/uL (ref 0.1–1.0)
Monocytes Relative: 10 % (ref 3–12)
Neutro Abs: 3.4 10*3/uL (ref 1.7–7.7)
Neutrophils Relative %: 57 % (ref 43–77)
PLATELETS: 168 10*3/uL (ref 150–400)
RBC: 4.6 MIL/uL (ref 3.87–5.11)
RDW: 13.2 % (ref 11.5–15.5)
WBC: 5.9 10*3/uL (ref 4.0–10.5)

## 2014-11-14 LAB — BASIC METABOLIC PANEL
BUN: 12 mg/dL (ref 6–23)
CHLORIDE: 98 meq/L (ref 96–112)
CO2: 30 meq/L (ref 19–32)
CREATININE: 0.73 mg/dL (ref 0.50–1.10)
Calcium: 8.8 mg/dL (ref 8.4–10.5)
Glucose, Bld: 101 mg/dL — ABNORMAL HIGH (ref 70–99)
POTASSIUM: 4.1 meq/L (ref 3.5–5.3)
Sodium: 136 mEq/L (ref 135–145)

## 2014-11-14 MED ORDER — ALPRAZOLAM 1 MG PO TABS: ORAL_TABLET | ORAL | Status: DC

## 2014-11-14 MED ORDER — MELOXICAM 15 MG PO TABS: ORAL_TABLET | ORAL | Status: DC

## 2014-11-14 MED ORDER — DULOXETINE HCL 30 MG PO CPEP: ORAL_CAPSULE | ORAL | Status: DC

## 2014-11-14 MED ORDER — VITAMIN D (ERGOCALCIFEROL) 1.25 MG (50000 UNIT) PO CAPS: ORAL_CAPSULE | ORAL | Status: DC

## 2014-11-14 NOTE — Patient Instructions (Addendum)
I think your next visit should be to establish with a new provider. You will be due for complete physical exam in August or later this year. We have physicians, PA-Cs and a nurse practitioner.  You may decide that you need to see a provider based on the day of the week or you may prefer female provider.

## 2014-11-15 LAB — THYROID PANEL WITH TSH
FREE THYROXINE INDEX: 1.9 (ref 1.4–3.8)
T3 Uptake: 27 % (ref 22–35)
T4, Total: 6.9 ug/dL (ref 4.5–12.0)
TSH: 1.563 u[IU]/mL (ref 0.350–4.500)

## 2014-11-15 LAB — VITAMIN D 25 HYDROXY (VIT D DEFICIENCY, FRACTURES): VIT D 25 HYDROXY: 20 ng/mL — AB (ref 30–100)

## 2014-11-16 MED ORDER — VITAMIN D (ERGOCALCIFEROL) 1.25 MG (50000 UNIT) PO CAPS: ORAL_CAPSULE | ORAL | Status: DC

## 2014-11-16 NOTE — Progress Notes (Signed)
Quick Note:  Please advise pt regarding following labs...  All lab results are normal except Vitamin D. It is still below normal at 20. Take the prescribed supplement once a week until you have no refills left. I have added additional refills to get you through the end of this year. Once you have no refills, get an OTC supplement Vitamin D3 2000 units and take it everyday. Follow-up with your new provider as we discussed.  Copy to pt. ______
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
## 2014-11-26 ENCOUNTER — Encounter: Payer: Self-pay | Admitting: Family Medicine

## 2014-11-26 ENCOUNTER — Telehealth: Payer: Self-pay

## 2014-11-26 NOTE — Telephone Encounter (Signed)
Spoke with patient gave her lab results

## 2014-11-26 NOTE — Telephone Encounter (Signed)
Pt called about labs. LMOM to CB  All lab results are normal except Vitamin D. It is still below normal at 20. Take the prescribed supplement once a week until you have no refills left. I have added additional refills to get you through the end of this year. Once you have no refills, get an OTC supplement Vitamin D3 2000 units and take it everyday. Follow-up with your new provider as we discussed.

## 2014-12-21 ENCOUNTER — Other Ambulatory Visit: Payer: Self-pay | Admitting: Physician Assistant

## 2014-12-28 ENCOUNTER — Emergency Department (HOSPITAL_COMMUNITY)
Admission: EM | Admit: 2014-12-28 | Discharge: 2014-12-28 | Disposition: A | Discharge status: Home / Self Care | Payer: BLUE CROSS/BLUE SHIELD | Attending: Emergency Medicine | Admitting: Emergency Medicine

## 2014-12-28 ENCOUNTER — Emergency Department (HOSPITAL_COMMUNITY): Payer: BLUE CROSS/BLUE SHIELD

## 2014-12-28 ENCOUNTER — Encounter (HOSPITAL_COMMUNITY): Payer: Self-pay | Admitting: Emergency Medicine

## 2014-12-28 DIAGNOSIS — I10 Essential (primary) hypertension: Secondary | ICD-10-CM | POA: Insufficient documentation

## 2014-12-28 DIAGNOSIS — Z87891 Personal history of nicotine dependence: Secondary | ICD-10-CM | POA: Insufficient documentation

## 2014-12-28 DIAGNOSIS — G709 Myoneural disorder, unspecified: Secondary | ICD-10-CM | POA: Insufficient documentation

## 2014-12-28 DIAGNOSIS — M159 Polyosteoarthritis, unspecified: Secondary | ICD-10-CM | POA: Diagnosis not present

## 2014-12-28 DIAGNOSIS — Z79899 Other long term (current) drug therapy: Secondary | ICD-10-CM | POA: Insufficient documentation

## 2014-12-28 DIAGNOSIS — Y9389 Activity, other specified: Secondary | ICD-10-CM | POA: Diagnosis not present

## 2014-12-28 DIAGNOSIS — Z85048 Personal history of other malignant neoplasm of rectum, rectosigmoid junction, and anus: Secondary | ICD-10-CM | POA: Insufficient documentation

## 2014-12-28 DIAGNOSIS — Z791 Long term (current) use of non-steroidal anti-inflammatories (NSAID): Secondary | ICD-10-CM | POA: Insufficient documentation

## 2014-12-28 DIAGNOSIS — F419 Anxiety disorder, unspecified: Secondary | ICD-10-CM | POA: Diagnosis not present

## 2014-12-28 DIAGNOSIS — E780 Pure hypercholesterolemia: Secondary | ICD-10-CM | POA: Insufficient documentation

## 2014-12-28 DIAGNOSIS — Y998 Other external cause status: Secondary | ICD-10-CM | POA: Insufficient documentation

## 2014-12-28 DIAGNOSIS — Y92091 Bathroom in other non-institutional residence as the place of occurrence of the external cause: Secondary | ICD-10-CM | POA: Diagnosis not present

## 2014-12-28 DIAGNOSIS — W01198A Fall on same level from slipping, tripping and stumbling with subsequent striking against other object, initial encounter: Secondary | ICD-10-CM | POA: Diagnosis not present

## 2014-12-28 DIAGNOSIS — F329 Major depressive disorder, single episode, unspecified: Secondary | ICD-10-CM | POA: Insufficient documentation

## 2014-12-28 DIAGNOSIS — R011 Cardiac murmur, unspecified: Secondary | ICD-10-CM | POA: Insufficient documentation

## 2014-12-28 DIAGNOSIS — S299XXA Unspecified injury of thorax, initial encounter: Secondary | ICD-10-CM | POA: Diagnosis present

## 2014-12-28 DIAGNOSIS — R0781 Pleurodynia: Secondary | ICD-10-CM

## 2014-12-28 NOTE — ED Notes (Signed)
Pt states that she fell last night.  Trip and fall.  States she fell onto the corner of the bathtub.  C/o pain upon inspiration.

## 2014-12-28 NOTE — ED Provider Notes (Signed)
CSN: [id number]     Arrival date & time 12/28/14  1250 History  This chart was scribed for non-physician practitioner Ottie Glazier, PA-C working with Francine Graven, DO by Hilda Lias, ED Scribe. This patient was seen in room [place] and the patient's care was started at 1:29 PM.    Chief Complaint  Patient presents with  . Fall  . Chest Pain     The history is provided by the patient. No language interpreter was used.     HPI Comments: [name] [name] is a 59 y.o. female who presents to the Emergency Department complaining of constant aching chest pain that has been present since this morning after pt tripped and fell over her bath mat in the middle of the night last night when she got up to go to the bathroom. Pt states she hit the edge of the bath tub but denies hitting her head or losing consciousness. Pt states she then went back to sleep and could feel discomfort in her chest afterwards when she tried to move or roll over in bed. Pt also states she feels pain when she takes deep breaths. Pt denies a Hx of medical problems.    Past Medical History  Diagnosis Date  . Essential hypertension, benign   . Knee pain   . Ganglion cyst   . Heart murmur   . Neuromuscular disorder   . Depression   . Anxiety   . High cholesterol   . Rectosigmoid cancer 2004     S/P Surgery, Chemo, Rad Tx,  (after chemo treatment menstral periods have ceased).  . Arthritis     "right hip; left hip; most of my joints" (05/27/2014)   Past Surgical History  Procedure Laterality Date  . Lumbar disc surgery  Feb 2007  . Colectomy  Aug 2004    S/P Low Anterior Resxn (Sigmoid Colon)  . Back surgery    . Dilation and curettage of uterus    . Joint replacement    . Total hip arthroplasty Right 05/27/2014    Procedure: RIGHT TOTAL HIP ARTHROPLASTY ANTERIOR APPROACH;  Surgeon: Hessie Dibble, MD;  Location: [facility];  Service: Orthopedics;  Laterality: Right;   Family History  Problem Relation Age  of Onset  . Adopted: Yes   History  Substance Use Topics  . Smoking status: Former Smoker    Types: Cigarettes  . Smokeless tobacco: Never Used     Comment: "smoked some socially in my college days"  . Alcohol Use: 0.6 oz/week    1 Cans of beer per week   OB History    No data available     Review of Systems  Respiratory: Positive for shortness of breath.   Cardiovascular: Positive for chest pain.  Gastrointestinal: Negative for abdominal pain.  Musculoskeletal: Positive for arthralgias. Negative for back pain and neck pain.  Neurological: Negative for dizziness, syncope, weakness and light-headedness.      Allergies  Review of patient's allergies indicates no known allergies.  Home Medications   Prior to Admission medications   Medication Sig Start Date End Date Taking? Authorizing Provider  ALPRAZolam Duanne Moron) 1 MG tablet TAKE 1/2-1 TABLET BY MOUTH AT BEDTIME AS NEEDED FOR SLEEP 11/14/14  Yes Barton Fanny, MD  DULoxetine (CYMBALTA) 30 MG capsule TAKE ONE CAPSULE BY MOUTH EVERY DAY. 11/14/14  Yes Barton Fanny, MD  lisinopril-hydrochlorothiazide (PRINZIDE,ZESTORETIC) 20-12.5 MG per tablet Take 2 tablets by mouth daily.   Yes Historical Provider, MD  meloxicam (  MOBIC) 15 MG tablet TAKE 1 TABLET (15 MG TOTAL) BY MOUTH DAILY. 11/14/14  Yes Barton Fanny, MD  pravastatin (PRAVACHOL) 80 MG tablet Take 80 mg by mouth daily.   Yes Historical Provider, MD  Vitamin D, Ergocalciferol, (DRISDOL) 50000 UNITS CAPS capsule TAKE 1 CAPSULE BY MOUTH ONCE A WEEK. Patient taking differently: TAKE 1 CAPSULE BY MOUTH ONCE A WEEK. On Wednesdays 11/16/14  Yes Barton Fanny, MD   BP 169/69 mmHg  Pulse 79  Temp(Src) 97.4 F (36.3 C) (Oral)  Resp 20  SpO2 100% Physical Exam  Constitutional: She is oriented to person, place, and time. She appears well-developed and well-nourished.  HENT:  Head: Normocephalic and atraumatic.  Eyes: Conjunctivae are normal.  Neck: Normal range  of motion.  Cardiovascular: Normal rate, regular rhythm and normal heart sounds.   Pulmonary/Chest: Effort normal and breath sounds normal. No respiratory distress. She has no wheezes. She has no rales.   She exhibits tenderness.  Patient has tenderness along the lateral chest wall bilaterally and the sternum. No clavicle deformity. She is able to abduct her arms bilaterally without difficulty.  Pain with inspiration.   Abdominal: Soft. She exhibits no distension.  Musculoskeletal: Normal range of motion. She exhibits no edema or tenderness.  Neurological: She is alert and oriented to person, place, and time.  Skin: Skin is warm and dry.  Psychiatric: She has a normal mood and affect.  Nursing note and vitals reviewed.   ED Course  Procedures (including critical care time)  DIAGNOSTIC STUDIES: Oxygen Saturation is 98% on room air, normal by my interpretation.    COORDINATION OF CARE: 1:34 PM Discussed treatment plan with pt at bedside and pt agreed to plan.   Labs Review Labs Reviewed - No data to display  Imaging Review Dg Chest 2 View  12/28/2014   CLINICAL DATA:  Pain following fall.  Hypertension.  EXAM: CHEST  2 VIEW  COMPARISON:  May 19, 2014  FINDINGS: There is no edema or consolidation. Heart size and pulmonary vascularity normal. No adenopathy. There is calcification in the ascending thoracic aorta, stable. No pneumothorax. There is degenerative change in the thoracic spine. No fractures are evident.  IMPRESSION: No edema or consolidation. Stable calcification in the ascending thoracic aorta. No pneumothorax.   Electronically Signed   By: Lowella Grip III M.D.   On: 12/28/2014 14:09     EKG Interpretation   Date/Time:  Sunday December 28 2014 12:58:38 EDT Ventricular Rate:  86 PR Interval:  168 QRS Duration: 74 QT Interval:  405 QTC Calculation: 484 R Axis:   45 Text Interpretation:  Sinus rhythm Consider left atrial enlargement Low  voltage, precordial leads  Borderline T wave abnormalities Baseline wander  in lead(s) III aVL aVF ED PHYSICIAN INTERPRETATION AVAILABLE IN [facility] Confirmed by TEST, Record ([id number]) on 12/29/2014 6:36:57 AM      MDM   Final diagnoses:  Rib pain on right side   Patient has pain with inspiration due to trip and fall last night. 100% oxygen on room air.  Vitals are stable.  No difficulty breathing or use of accessory muscles. Her xray does not show any fractures or pneumothorax. I gave her an incentive spirometer and she can take tylenol for pain.  Patient verbally agrees with the plan.  I personally performed the services described in this documentation, which was scribed in my presence. The recorded information has been reviewed and is accurate.    Ottie Glazier, PA-C 12/29/14  [address] [place] 12/30/14 [phone]

## 2014-12-28 NOTE — Discharge Instructions (Signed)
Chest Wall Pain Tylenol or Motrin for pain. Use incentive spirometry several times a day for the next 5 days. Chest wall pain is pain in or around the bones and muscles of your chest. It may take up to 6 weeks to get better. It may take longer if you must stay physically active in your work and activities.  CAUSES  Chest wall pain may happen on its own. However, it may be caused by:  A viral illness like the flu.  Injury.  Coughing.  Exercise.  Arthritis.  Fibromyalgia.  Shingles. HOME CARE INSTRUCTIONS   Avoid overtiring physical activity. Try not to strain or perform activities that cause pain. This includes any activities using your chest or your abdominal and side muscles, especially if heavy weights are used.  Put ice on the sore area.  Put ice in a plastic bag.  Place a towel between your skin and the bag.  Leave the ice on for 15-20 minutes per hour while awake for the first 2 days.  Only take over-the-counter or prescription medicines for pain, discomfort, or fever as directed by your caregiver. SEEK IMMEDIATE MEDICAL CARE IF:   Your pain increases, or you are very uncomfortable.  You have a fever.  Your chest pain becomes worse.  You have new, unexplained symptoms.  You have nausea or vomiting.  You feel sweaty or lightheaded.  You have a cough with phlegm (sputum), or you cough up blood. MAKE SURE YOU:   Understand these instructions.  Will watch your condition.  Will get help right away if you are not doing well or get worse. Document Released: 06/27/2005 Document Revised: 09/19/2011 Document Reviewed: 02/21/2011 [facility] Patient Information 2015 [place], [place]. This information is not intended to replace advice given to you by your health care provider. Make sure you discuss any questions you have with your health care provider.

## 2014-12-31 ENCOUNTER — Ambulatory Visit (INDEPENDENT_AMBULATORY_CARE_PROVIDER_SITE_OTHER): Payer: BLUE CROSS/BLUE SHIELD | Admitting: Physician Assistant

## 2014-12-31 ENCOUNTER — Encounter: Payer: Self-pay | Admitting: Physician Assistant

## 2014-12-31 VITALS — BP 160/80 | HR 100 | Temp 98.0°F | Resp 16 | Ht 62.75 in | Wt 154.0 lb

## 2014-12-31 DIAGNOSIS — R21 Rash and other nonspecific skin eruption: Secondary | ICD-10-CM | POA: Diagnosis not present

## 2014-12-31 DIAGNOSIS — Z9181 History of falling: Secondary | ICD-10-CM

## 2014-12-31 DIAGNOSIS — R0781 Pleurodynia: Secondary | ICD-10-CM | POA: Diagnosis not present

## 2014-12-31 MED ORDER — METHOCARBAMOL 500 MG PO TABS: 250.0000 mg | ORAL_TABLET | Freq: Three times a day (TID) | ORAL | Status: DC

## 2014-12-31 MED ORDER — TRAMADOL HCL 50 MG PO TABS: 50.0000 mg | ORAL_TABLET | Freq: Three times a day (TID) | ORAL | Status: DC | PRN

## 2014-12-31 MED ORDER — NYSTATIN 100000 UNIT/GM EX POWD: CUTANEOUS | Status: DC

## 2014-12-31 NOTE — Progress Notes (Signed)
[name] [name]  MRN: [id number] DOB: May 25, 1956  Subjective:  Pt presents to clinic for recheck after a fall on 6/19.  She was seen in the ED on the day of her injury.  Since then she has been very sore along her anterior mid rib cage across her breast area.  She has developed significant bruising.  She is not sleeping well because she is stiff and uncomfortable when she moves.  She has been using incentive spirometry but she still has significant pain across her chest wall with deep breaths.  She is having no cough or fevers or chills.  She did not go to work today because she did not sleep well last night and was having a lot of pain this am.  She has developed a big bruise on her left lateral thigh that has a tender know in it that she is worried about.  Patient Active Problem List   Diagnosis Date Noted  . Postoperative anemia due to acute blood loss 05/29/2014  . Primary osteoarthritis of right hip 05/27/2014  . ANXIETY DISORDER- mixed disorder w/ depressive symptoms 02/07/2014  . Knee pain   . PERIPHERAL NEUROPATHY 10/13/2007  . HYPERTENSION 10/13/2007  . HERNIA, UMBILICAL 07/13/7251  . COLONIC POLYPS, ADENOMATOUS 02/20/2003  . DIVERTICULOSIS, COLON 02/20/2003  . ADENOCARCINOMA, COLON, SIGMOID, HX OF 02/20/2003    Current Outpatient Prescriptions on File Prior to Visit  Medication Sig Dispense Refill  . ALPRAZolam (XANAX) 1 MG tablet TAKE 1/2-1 TABLET BY MOUTH AT BEDTIME AS NEEDED FOR SLEEP 30 tablet 3  . DULoxetine (CYMBALTA) 30 MG capsule TAKE ONE CAPSULE BY MOUTH EVERY DAY. 30 capsule 11  . lisinopril-hydrochlorothiazide (PRINZIDE,ZESTORETIC) 20-12.5 MG per tablet Take 2 tablets by mouth daily.    . meloxicam (MOBIC) 15 MG tablet TAKE 1 TABLET (15 MG TOTAL) BY MOUTH DAILY. 30 tablet 5  . pravastatin (PRAVACHOL) 80 MG tablet Take 80 mg by mouth daily.    . Vitamin D, Ergocalciferol, (DRISDOL) 50000 UNITS CAPS capsule TAKE 1 CAPSULE BY MOUTH ONCE A WEEK. (Patient taking  differently: TAKE 1 CAPSULE BY MOUTH ONCE A WEEK. On Wednesdays) 4 capsule 8   No current facility-administered medications on file prior to visit.    No Known Allergies  Review of Systems  Constitutional: Negative for fever and chills.  Respiratory: Negative for cough and shortness of breath.        Chest pain in the area where she hit when she fell in the same distribution as the bruise.  Musculoskeletal: Positive for myalgias.  Psychiatric/Behavioral: Positive for sleep disturbance (2nd to pain).   Objective:  BP 160/80 mmHg  Pulse 100  Temp(Src) 98 F (36.7 C) (Oral)  Resp 16  Ht 5' 2.75" (1.594 m)  Wt 154 lb (69.854 kg)  BMI 27.49 kg/m2  SpO2 96%  Physical Exam  Constitutional: She is oriented to person, place, and time and well-developed, well-nourished, and in no distress.  HENT:  Head: Normocephalic and atraumatic.  Right Ear: Hearing and external ear normal.  Left Ear: Hearing and external ear normal.  Eyes: Conjunctivae are normal.  Neck: Normal range of motion.  Cardiovascular: Normal rate, regular rhythm and normal heart sounds.   Pulmonary/Chest: Effort normal and breath sounds normal. No accessory muscle usage. No respiratory distress. She has no wheezes. She exhibits tenderness.    Erythematous fungal like rash under left breast with satelitte lesions  Musculoskeletal:       Left hip: Normal.  Right knee: She exhibits ecchymosis.       Left knee: She exhibits ecchymosis.       Legs: Neurological: She is alert and oriented to person, place, and time. Gait normal.  Skin: Skin is warm and dry.  Pt has ecchymosis bilateral anterior knees where she would kneel - these are not tender.  Ecchymosis on lateral left hip.  Left breast is almost completely ecchymotic - purplish color without erythema or warmth - her breast is tender without palpable hematomas.  Psychiatric: Mood, memory, affect and judgment normal.  Vitals reviewed.   Assessment and Plan :    Rib pain - Pt has contusions and myalgias 2nd to earlier this week. She seems to be healing well and there is not sign that she is not breathing deep enough causing 2nd problems.  She will continue to use incentive spirometry and f/u if she has any problems in regards to her breathing.  We will start amuscle relaxer to see if that will help her stiffness and give her something for pain to at least allow to rest better.  Plan: traMADol (ULTRAM) 50 MG tablet, methocarbamol (ROBAXIN) 500 MG tablet  History of fall within past 90 days  Rash, skin - she will try to keep the area dry.  Plan: nystatin (MYCOSTATIN/NYSTOP) 100000 UNIT/GM POWD  Windell Hummingbird PA-C  [facility] 01/02/2015 11:42 AM

## 2014-12-31 NOTE — Patient Instructions (Signed)
Powder to under the left breast - this will help the itching and irritation - it is ok to still wear a bra with the powder.

## 2015-01-09 ENCOUNTER — Ambulatory Visit (AMBULATORY_SURGERY_CENTER): Payer: Self-pay | Admitting: *Deleted

## 2015-01-09 VITALS — Ht 63.0 in | Wt 155.0 lb

## 2015-01-09 DIAGNOSIS — Z85038 Personal history of other malignant neoplasm of large intestine: Secondary | ICD-10-CM

## 2015-01-09 MED ORDER — NA SULFATE-K SULFATE-MG SULF 17.5-3.13-1.6 GM/177ML PO SOLN: ORAL | Status: DC

## 2015-01-09 NOTE — Progress Notes (Signed)
No allergies to eggs or soy. No problems with anesthesia.  Pt given Emmi instructions for colonoscopy  No oxygen use  No diet drug use  

## 2015-01-13 ENCOUNTER — Encounter: Payer: Self-pay | Admitting: Internal Medicine

## 2015-01-22 NOTE — Addendum Note (Signed)
Addended by: Steva Ready on: 01/22/2015 11:00 AM   Modules accepted: Level of Service

## 2015-01-23 ENCOUNTER — Ambulatory Visit (AMBULATORY_SURGERY_CENTER): Payer: BLUE CROSS/BLUE SHIELD | Admitting: Internal Medicine

## 2015-01-23 ENCOUNTER — Encounter: Payer: Self-pay | Admitting: Internal Medicine

## 2015-01-23 VITALS — BP 139/71 | HR 65 | Temp 97.4°F | Resp 25 | Ht 63.0 in | Wt 155.0 lb

## 2015-01-23 DIAGNOSIS — D120 Benign neoplasm of cecum: Secondary | ICD-10-CM

## 2015-01-23 DIAGNOSIS — Z85038 Personal history of other malignant neoplasm of large intestine: Secondary | ICD-10-CM

## 2015-01-23 MED ORDER — SODIUM CHLORIDE 0.9 % IV SOLN: 500.0000 mL | INTRAVENOUS | Status: DC

## 2015-01-23 NOTE — Op Note (Signed)
[facility]  [address] [place], [id number]   COLONOSCOPY PROCEDURE REPORT  PATIENT: [name] [name], [name] [name]  MR#: [id number] BIRTHDATE: September 21, 1955 , 43  yrs. old GENDER: female ENDOSCOPIST: Lafayette Dragon, MD REFERRED YT:[id number] McPherson, M.D. PROCEDURE DATE:  01/23/2015 PROCEDURE:   Colonoscopy, surveillance and Colonoscopy with snare polypectomy First Screening Colonoscopy - Avg.  risk and is 50 yrs.  old or older - No.  Prior Negative Screening - Now for repeat screening. N/A  History of Adenoma - Now for follow-up colonoscopy & has been > or = to 3 yrs.  N/A  Polyps removed today? Yes ASA CLASS:   Class II INDICATIONS:Surveillance due to prior colonic neoplasia, PH Colon or Rectal Adenocarcinoma, and history of sigmoid carcinoma in 2004 resected.  Status post radiation.  Prior colonoscopy 2007 and most recently January 2011. MEDICATIONS: Monitored anesthesia care and Propofol 200 mg IV  DESCRIPTION OF PROCEDURE:   After the risks benefits and alternatives of the procedure were thoroughly explained, informed consent was obtained.  The digital rectal exam revealed no abnormalities of the rectum.   The [id number]  endoscope was introduced through the anus and advanced to the cecum, which was identified by both the appendix and ileocecal valve. No adverse events experienced.   The quality of the prep was good.  (MoviPrep was used)  The instrument was then slowly withdrawn as the colon was fully examined. Estimated blood loss is zero unless otherwise noted in this procedure report.      COLON FINDINGS: A polypoid shaped sessile polyp measuring 20 mm in size with a friable surface was found at the cecum.  A polypectomy was performed with a cold snare.  The resection was complete, the polyp tissue was completely retrieved and sent to histology. There was evidence of a prior end-to-end colorectal surgical anastomosis in the rectum.   There was mild  diverticulosis noted in the sigmoid colon.  Retroflexed views revealed no abnormalities. The time to cecum = 3.19 Withdrawal time = 8.50   The scope was withdrawn and the procedure completed. COMPLICATIONS: There were no immediate complications.  ENDOSCOPIC IMPRESSION: 1.   Sessile carpeted  polyp was found at the cecum; polypectomy was performed with a cold snare removed piecemeal 2.   There was evidence of a prior colorectal surgical anastomosis in the rectum  at 5 cm, widely open 3.   Mild diverticulosis was noted in the sigmoid colon  RECOMMENDATIONS: 1.  Await pathology results 2.  High fiber diet Recall colonoscopy pending path report, probably 2 years  eSigned:  Lafayette Dragon, MD 01/23/2015 9:32 AM   cc:   PATIENT NAME:  [name] [name], [name] [name] MR#: [id number]

## 2015-01-23 NOTE — Progress Notes (Signed)
Called to room to assist during endoscopic procedure.  Patient ID and intended procedure confirmed with present staff. Received instructions for my participation in the procedure from the performing physician.  

## 2015-01-23 NOTE — Progress Notes (Signed)
Transferred to recovery room. A/O x3, pleased with MAC.  VSS.  Report to Wendy, RN. 

## 2015-01-23 NOTE — Patient Instructions (Addendum)
YOU HAD AN ENDOSCOPIC PROCEDURE TODAY AT [facility]:   Refer to the procedure report that was given to you for any specific questions about what was found during the examination.  If the procedure report does not answer your questions, please call your gastroenterologist to clarify.  If you requested that your care partner not be given the details of your procedure findings, then the procedure report has been included in a sealed envelope for you to review at your convenience later.  YOU SHOULD EXPECT: Some feelings of bloating in the abdomen. Passage of more gas than usual.  Walking can help get rid of the air that was put into your GI tract during the procedure and reduce the bloating. If you had a lower endoscopy (such as a colonoscopy or flexible sigmoidoscopy) you may notice spotting of blood in your stool or on the toilet paper. If you underwent a bowel prep for your procedure, you may not have a normal bowel movement for a few days.  Please Note:  You might notice some irritation and congestion in your nose or some drainage.  This is from the oxygen used during your procedure.  There is no need for concern and it should clear up in a day or so.  SYMPTOMS TO REPORT IMMEDIATELY:   Following lower endoscopy (colonoscopy or flexible sigmoidoscopy):  Excessive amounts of blood in the stool  Significant tenderness or worsening of abdominal pains  Swelling of the abdomen that is new, acute  Fever of 100F or higher  For urgent or emergent issues, a gastroenterologist can be reached at any hour by calling [phone].   DIET: Your first meal following the procedure should be a small meal and then it is ok to progress to your normal diet. Heavy or fried foods are harder to digest and may make you feel nauseous or bloated.  Likewise, meals heavy in dairy and vegetables can increase bloating.  Drink plenty of fluids but you should avoid alcoholic beverages for 24  hours.  ACTIVITY:  You should plan to take it easy for the rest of today and you should NOT DRIVE or use heavy machinery until tomorrow (because of the sedation medicines used during the test).    FOLLOW UP: Our staff will call the number listed on your records the next business day following your procedure to check on you and address any questions or concerns that you may have regarding the information given to you following your procedure. If we do not reach you, we will leave a message.  However, if you are feeling well and you are not experiencing any problems, there is no need to return our call.  We will assume that you have returned to your regular daily activities without incident.  If any biopsies were taken you will be contacted by phone or by letter within the next 1-3 weeks.  Please call us at [phone] if you have not heard about the biopsies in 3 weeks.    SIGNATURES/CONFIDENTIALITY: You and/or your care partner have signed paperwork which will be entered into your electronic medical record.  These signatures attest to the fact that that the information above on your After Visit Summary has been reviewed and is understood.  Full responsibility of the confidentiality of this discharge information lies with you and/or your care-partner.  Advised patient not to take Mobic or NSAIDS (aspirin, aspirin products, or anti-inflammatory drugs) for 2 weeks.

## 2015-01-26 ENCOUNTER — Telehealth: Payer: Self-pay | Admitting: *Deleted

## 2015-01-26 NOTE — Telephone Encounter (Signed)
  Follow up Call-  Call back number 01/23/2015  Post procedure Call Back phone  # [phone]  Permission to leave phone message Yes     Patient questions:  Message left to call us if necessary.

## 2015-01-27 ENCOUNTER — Encounter: Payer: Self-pay | Admitting: Internal Medicine

## 2015-03-17 ENCOUNTER — Telehealth: Payer: Self-pay | Admitting: Hematology

## 2015-03-17 NOTE — Telephone Encounter (Signed)
Moved 9/19 appointments from [facility] to [place]. Left message for patient and mailed schedule.

## 2015-03-30 ENCOUNTER — Ambulatory Visit (HOSPITAL_BASED_OUTPATIENT_CLINIC_OR_DEPARTMENT_OTHER): Payer: BLUE CROSS/BLUE SHIELD | Admitting: Hematology

## 2015-03-30 ENCOUNTER — Encounter: Payer: Self-pay | Admitting: Hematology

## 2015-03-30 ENCOUNTER — Other Ambulatory Visit (HOSPITAL_BASED_OUTPATIENT_CLINIC_OR_DEPARTMENT_OTHER): Payer: BLUE CROSS/BLUE SHIELD

## 2015-03-30 VITALS — BP 149/64 | HR 80 | Temp 98.4°F | Resp 18 | Ht 63.0 in | Wt 158.0 lb

## 2015-03-30 DIAGNOSIS — Z85038 Personal history of other malignant neoplasm of large intestine: Secondary | ICD-10-CM

## 2015-03-30 LAB — COMPREHENSIVE METABOLIC PANEL (CC13)
ALBUMIN: 4 g/dL (ref 3.5–5.0)
ALK PHOS: 61 U/L (ref 40–150)
ALT: 26 U/L (ref 0–55)
AST: 21 U/L (ref 5–34)
Anion Gap: 10 mEq/L (ref 3–11)
BUN: 11.8 mg/dL (ref 7.0–26.0)
CALCIUM: 9.4 mg/dL (ref 8.4–10.4)
CO2: 26 mEq/L (ref 22–29)
Chloride: 103 mEq/L (ref 98–109)
Creatinine: 0.7 mg/dL (ref 0.6–1.1)
EGFR: 89 mL/min/{1.73_m2} — AB (ref >90)
GLUCOSE: 107 mg/dL (ref 70–140)
POTASSIUM: 4.2 meq/L (ref 3.5–5.1)
SODIUM: 139 meq/L (ref 136–145)
Total Bilirubin: 0.49 mg/dL (ref 0.20–1.20)
Total Protein: 7.2 g/dL (ref 6.4–8.3)

## 2015-03-30 LAB — CBC & DIFF AND RETIC
BASO%: 0.3 % (ref 0.0–2.0)
Basophils Absolute: 0 10*3/uL (ref 0.0–0.1)
EOS ABS: 0.1 10*3/uL (ref 0.0–0.5)
EOS%: 0.8 % (ref 0.0–7.0)
HCT: 41 % (ref 34.8–46.6)
HEMOGLOBIN: 14 g/dL (ref 11.6–15.9)
Immature Retic Fract: 10.1 % — ABNORMAL HIGH (ref 1.60–10.00)
LYMPH%: 27.6 % (ref 14.0–49.7)
MCH: 31 pg (ref 25.1–34.0)
MCHC: 34.1 g/dL (ref 31.5–36.0)
MCV: 90.7 fL (ref 79.5–101.0)
MONO#: 0.4 10*3/uL (ref 0.1–0.9)
MONO%: 6.8 % (ref 0.0–14.0)
NEUT#: 3.9 10*3/uL (ref 1.5–6.5)
NEUT%: 64.5 % (ref 38.4–76.8)
Platelets: 154 10*3/uL (ref 145–400)
RBC: 4.52 10*6/uL (ref 3.70–5.45)
RDW: 12.1 % (ref 11.2–14.5)
Retic %: 1.96 % (ref 0.70–2.10)
Retic Ct Abs: 88.59 10*3/uL (ref 33.70–90.70)
WBC: 6.1 10*3/uL (ref 3.9–10.3)
lymph#: 1.7 10*3/uL (ref 0.9–3.3)

## 2015-03-31 LAB — CEA: CEA: 1.2 ng/mL (ref 0.0–5.0)

## 2015-04-03 ENCOUNTER — Encounter: Payer: Self-pay | Admitting: Family Medicine

## 2015-04-06 NOTE — Progress Notes (Signed)
[place], [place] No address on file  CC/DIAGNOSIS: f/u for Colo-Rectal Cancer  Diagnosis:  Adenocarcinoma of the rectosigmoid area, moderately differentiated T3 N0, stage II with negative margins and no lymphovascular Invasion, diagnosed in July 2004. The patient underwent a low anterior resection on 03/10/2003. All 10 lymph nodes were negative. The patient underwent treatment consisting of 5-FU by continuous infusion along with pelvic radiation 5040 cGy in 28 fractions which concluded in late December 2004. The patient received initial chemotherapy preceding these treatments and then treatment with FOLFOX until June of 2005.  CURRENT THERAPY: Observation  INTERVAL HISTORY:  [name] [name] 59 y.o. female with a history of colon cancer (2004) presents for followup.  She was last seen by Dr. Lona Kettle in 03/2014 and comes for her annual visit.  She notes no change in bowel habits. No rectal. Wt stable. No other acute new symptoms.  Rest of 10 point ROS neg.  MEDICAL HISTORY: Past Medical History  Diagnosis Date  . Essential hypertension, benign   . Knee pain   . Ganglion cyst   . Heart murmur   . Neuromuscular disorder   . Depression   . Anxiety   . High cholesterol   . Rectosigmoid cancer 2004     S/P Surgery, Chemo, Rad Tx,  (after chemo treatment menstral periods have ceased).  . Arthritis     "right hip; left hip; most of my joints" (05/27/2014)   ONCOLOGY HISTORY: Adenocarcinoma of the rectosigmoid area, moderately differentiated T3 N0, stage II with negative margins and no lymphovascular Invasion, diagnosed in July 2004. The patient underwent a low anterior resection on 03/10/2003. All 10 lymph nodes were negative. The patient underwent treatment consisting of 5-FU by continuous infusion along with pelvic radiation 5040 cGy in 28 fractions which concluded in late December 2004. The patient received initial  chemotherapy preceding these treatments and then treatment with FOLFOX until June of 2005. She remains disease free.  INTERIM HISTORY: has COLONIC POLYPS, ADENOMATOUS; PERIPHERAL NEUROPATHY; HYPERTENSION; HERNIA, UMBILICAL; DIVERTICULOSIS, COLON; ADENOCARCINOMA, COLON, SIGMOID, HX OF; Knee pain; ANXIETY DISORDER- mixed disorder w/ depressive symptoms; Primary osteoarthritis of right hip; and Postoperative anemia due to acute blood loss on her problem list.    ALLERGIES:  has No Known Allergies.  MEDICATIONS: has a current medication list which includes the following prescription(s): alprazolam, lisinopril-hydrochlorothiazide, meloxicam, pravastatin, and vitamin d (ergocalciferol).  SURGICAL HISTORY:  Past Surgical History  Procedure Laterality Date  . Lumbar disc surgery  Feb 2007  . Colectomy  Aug 2004    S/P Low Anterior Resxn (Sigmoid Colon)  . Back surgery    . Dilation and curettage of uterus  1991  . Joint replacement    . Total hip arthroplasty Right 05/27/2014    Procedure: RIGHT TOTAL HIP ARTHROPLASTY ANTERIOR APPROACH;  Surgeon: Hessie Dibble, MD;  Location: [facility];  Service: Orthopedics;  Laterality: Right;    REVIEW OF SYSTEMS:   10 point ROS done and is negative except as noted above.  PHYSICAL EXAMINATION: ECOG PERFORMANCE STATUS: 0  Blood pressure 149/64, pulse 80, temperature 98.4 F (36.9 C), temperature source Oral, resp. rate 18, height 5\' 3"  (1.6 m), weight 158 lb (71.668 kg), SpO2 98 %.  GENERAL:alert, no distress and comfortable SKIN: skin color, texture, turgor are normal, no rashes or significant lesions EYES: normal, Conjunctiva are pink and non-injected, sclera clear OROPHARYNX:no exudate, no erythema and lips, buccal mucosa, and tongue normal  NECK: supple, thyroid normal size, non-tender, without nodularity LYMPH:  no palpable lymphadenopathy in the cervical, axillary or inguinal LUNGS: clear to auscultation and percussion with normal breathing  effort HEART: regular rate & rhythm and no murmurs and no lower extremity edema ABDOMEN:abdomen soft, non-tender and normal bowel sounds no hepato-splenomegaly. Musculoskeletal:no cyanosis of digits and no clubbing  NEURO: alert & oriented x 3    LABORATORY DATA:  . CBC Latest Ref Rng 03/30/2015 11/14/2014 05/30/2014  WBC 3.9 - 10.3 10e3/uL 6.1 5.9 5.5  Hemoglobin 11.6 - 15.9 g/dL 14.0 13.9 9.7(L)  Hematocrit 34.8 - 46.6 % 41.0 40.8 28.7(L)  Platelets 145 - 400 10e3/uL 154 168 119(L)    . CMP Latest Ref Rng 03/30/2015 11/14/2014 05/28/2014  Glucose 70 - 140 mg/dl 107 101(H) 124(H)  BUN 7.0 - 26.0 mg/dL 11.8 12 10   Creatinine 0.6 - 1.1 mg/dL 0.7 0.73 0.67  Sodium 136 - 145 mEq/L 139 136 135(L)  Potassium 3.5 - 5.1 mEq/L 4.2 4.1 3.7  Chloride 96 - 112 mEq/L - 98 97  CO2 22 - 29 mEq/L 26 30 30   Calcium 8.4 - 10.4 mg/dL 9.4 8.8 8.3(L)  Total Protein 6.4 - 8.3 g/dL 7.2 - -  Total Bilirubin 0.20 - 1.20 mg/dL 0.49 - -  Alkaline Phos 40 - 150 U/L 61 - -  AST 5 - 34 U/L 21 - -  ALT 0 - 55 U/L 26 - -      RADIOGRAPHIC STUDIES:  02/08/13 Mammogram at [facility] negative.  ASSESSMENT:    1) Stage II Colo-rectal cancer diagnosed in 2004 s/p treatments as noted above currently on observation. Had a colonoscopy on 01/2015 that showed a tubular adenoma but no active colon cancer. CEA 1.2 stable. No clinical evidence of colon cancer recurrence at this time  PLAN:  -as per patients preference she would like to continue further colon cancer surveillance with her primary care physician. -we was also given the option of following in our survivorship clinic but prefers to have her PCP continue her f/u -rpt colonoscopy as per GI recommendation. -no indication for additional workup and treatment for her history of colon cancer.  Continue f/u with PCP  RTC with Dr Irene Limbo on an as needed basis  Sullivan Lone MD [place]

## 2015-05-05 ENCOUNTER — Encounter: Payer: Self-pay | Admitting: Internal Medicine

## 2015-07-22 ENCOUNTER — Other Ambulatory Visit: Payer: Self-pay

## 2015-07-22 MED ORDER — MELOXICAM 15 MG PO TABS: ORAL_TABLET | ORAL | Status: DC

## 2015-08-06 ENCOUNTER — Other Ambulatory Visit: Payer: Self-pay | Admitting: Orthopaedic Surgery

## 2015-08-14 ENCOUNTER — Encounter (HOSPITAL_COMMUNITY): Payer: Self-pay

## 2015-08-14 ENCOUNTER — Encounter (HOSPITAL_COMMUNITY)
Admission: RE | Admit: 2015-08-14 | Discharge: 2015-08-14 | Disposition: A | Discharge status: Home / Self Care | Payer: BLUE CROSS/BLUE SHIELD | Source: Ambulatory Visit | Attending: Orthopaedic Surgery | Admitting: Orthopaedic Surgery

## 2015-08-14 DIAGNOSIS — Z79899 Other long term (current) drug therapy: Secondary | ICD-10-CM | POA: Insufficient documentation

## 2015-08-14 DIAGNOSIS — Z01812 Encounter for preprocedural laboratory examination: Secondary | ICD-10-CM | POA: Insufficient documentation

## 2015-08-14 DIAGNOSIS — Z0183 Encounter for blood typing: Secondary | ICD-10-CM | POA: Diagnosis not present

## 2015-08-14 DIAGNOSIS — I10 Essential (primary) hypertension: Secondary | ICD-10-CM | POA: Insufficient documentation

## 2015-08-14 DIAGNOSIS — E785 Hyperlipidemia, unspecified: Secondary | ICD-10-CM | POA: Insufficient documentation

## 2015-08-14 DIAGNOSIS — Z85048 Personal history of other malignant neoplasm of rectum, rectosigmoid junction, and anus: Secondary | ICD-10-CM | POA: Diagnosis not present

## 2015-08-14 DIAGNOSIS — Z01818 Encounter for other preprocedural examination: Secondary | ICD-10-CM | POA: Insufficient documentation

## 2015-08-14 DIAGNOSIS — I351 Nonrheumatic aortic (valve) insufficiency: Secondary | ICD-10-CM | POA: Diagnosis not present

## 2015-08-14 DIAGNOSIS — Z96641 Presence of right artificial hip joint: Secondary | ICD-10-CM | POA: Insufficient documentation

## 2015-08-14 DIAGNOSIS — M1612 Unilateral primary osteoarthritis, left hip: Secondary | ICD-10-CM | POA: Diagnosis not present

## 2015-08-14 DIAGNOSIS — Z87891 Personal history of nicotine dependence: Secondary | ICD-10-CM | POA: Diagnosis not present

## 2015-08-14 HISTORY — DX: Nonrheumatic aortic (valve) insufficiency: I35.1

## 2015-08-14 HISTORY — DX: Diverticulosis of intestine, part unspecified, without perforation or abscess without bleeding: K57.90

## 2015-08-14 HISTORY — DX: Nocturia: R35.1

## 2015-08-14 HISTORY — DX: Pain in unspecified joint: M25.50

## 2015-08-14 HISTORY — DX: Polyneuropathy, unspecified: G62.9

## 2015-08-14 HISTORY — DX: Personal history of other medical treatment: Z92.89

## 2015-08-14 HISTORY — DX: Anemia, unspecified: D64.9

## 2015-08-14 HISTORY — DX: Nausea with vomiting, unspecified: Z98.890

## 2015-08-14 HISTORY — DX: Insomnia, unspecified: G47.00

## 2015-08-14 HISTORY — DX: Other specified postprocedural states: R11.2

## 2015-08-14 HISTORY — DX: Personal history of colonic polyps: Z86.010

## 2015-08-14 LAB — CBC WITH DIFFERENTIAL/PLATELET
Basophils Absolute: 0 10*3/uL (ref 0.0–0.1)
Basophils Relative: 1 %
EOS PCT: 1 %
Eosinophils Absolute: 0.1 10*3/uL (ref 0.0–0.7)
HEMATOCRIT: 40.7 % (ref 36.0–46.0)
HEMOGLOBIN: 13.9 g/dL (ref 12.0–15.0)
LYMPHS ABS: 2 10*3/uL (ref 0.7–4.0)
LYMPHS PCT: 30 %
MCH: 31 pg (ref 26.0–34.0)
MCHC: 34.2 g/dL (ref 30.0–36.0)
MCV: 90.8 fL (ref 78.0–100.0)
Monocytes Absolute: 0.4 10*3/uL (ref 0.1–1.0)
Monocytes Relative: 7 %
NEUTROS ABS: 4 10*3/uL (ref 1.7–7.7)
Neutrophils Relative %: 61 %
PLATELETS: 169 10*3/uL (ref 150–400)
RBC: 4.48 MIL/uL (ref 3.87–5.11)
RDW: 12.4 % (ref 11.5–15.5)
WBC: 6.6 10*3/uL (ref 4.0–10.5)

## 2015-08-14 LAB — URINALYSIS, ROUTINE W REFLEX MICROSCOPIC
BILIRUBIN URINE: NEGATIVE
Glucose, UA: NEGATIVE mg/dL
HGB URINE DIPSTICK: NEGATIVE
KETONES UR: NEGATIVE mg/dL
NITRITE: NEGATIVE
Protein, ur: NEGATIVE mg/dL
Specific Gravity, Urine: 1.009 (ref 1.005–1.030)
pH: 6 (ref 5.0–8.0)

## 2015-08-14 LAB — TYPE AND SCREEN
ABO/RH(D): O POS
ANTIBODY SCREEN: NEGATIVE

## 2015-08-14 LAB — PROTIME-INR
INR: 1.03 (ref 0.00–1.49)
Prothrombin Time: 13.7 seconds (ref 11.6–15.2)

## 2015-08-14 LAB — SURGICAL PCR SCREEN
MRSA, PCR: NEGATIVE
Staphylococcus aureus: NEGATIVE

## 2015-08-14 LAB — URINE MICROSCOPIC-ADD ON

## 2015-08-14 LAB — APTT: aPTT: 29 seconds (ref 24–37)

## 2015-08-14 MED ORDER — CHLORHEXIDINE GLUCONATE 4 % EX LIQD: 60.0000 mL | Freq: Once | CUTANEOUS | Status: DC

## 2015-08-14 NOTE — Progress Notes (Addendum)
Cardiologist is Dr.Spencer Wynonia [name] with last visit about 2 yrs ago-to request visit  Echo done about 2 yrs ago to request  Stress test denies  Heart cath denies  EKG and CXR in epic from 12-28-14

## 2015-08-14 NOTE — Pre-Procedure Instructions (Signed)
NALLELI STATES  08/14/2015      [id number] [address] [place], [place] [address] [place] [address] Phone: [phone] Fax: [phone]    Your procedure is scheduled on Tues, Feb 14 @ 7:30 AM  Report to [facility] Admitting at 5:30 AM  Call this number if you have problems the morning of surgery:  [phone]   Remember:  Do not eat food or drink liquids after midnight.  Take these medicines the morning of surgery with A SIP OF WATER Eye Drops             Stop taking your Meloxicam along with any Vitamins or Herbal Medications. No Goody's,BC's,Aleve,Aspirin,Ibuprofen,Advil,Motrin,or Fish Oil.   Do not wear jewelry, make-up or nail polish.  Do not wear lotions, powders, or perfumes.  You may wear deodorant.  Do not shave 48 hours prior to surgery.     Do not bring valuables to the hospital.  [facility] is not responsible for any belongings or valuables.  Contacts, dentures or bridgework may not be worn into surgery.  Leave your suitcase in the car.  After surgery it may be brought to your room.  For patients admitted to the hospital, discharge time will be determined by your treatment team.  Patients discharged the day of surgery will not be allowed to drive home.    Special instructions:  [place] - Preparing for Surgery  Before surgery, you can play an important role.  Because skin is not sterile, your skin needs to be as free of germs as possible.  You can reduce the number of germs on you skin by washing with CHG (chlorahexidine gluconate) soap before surgery.  CHG is an antiseptic cleaner which kills germs and bonds with the skin to continue killing germs even after washing.  Please DO NOT use if you have an allergy to CHG or antibacterial soaps.  If your skin becomes reddened/irritated stop using the CHG and inform your nurse when you arrive at Short Stay.  Do not shave (including legs and underarms) for at least 48 hours  prior to the first CHG shower.  You may shave your face.  Please follow these instructions carefully:   1.  Shower with CHG Soap the night before surgery and the                                morning of Surgery.  2.  If you choose to wash your hair, wash your hair first as usual with your       normal shampoo.  3.  After you shampoo, rinse your hair and body thoroughly to remove the                      Shampoo.  4.  Use CHG as you would any other liquid soap.  You can apply chg directly       to the skin and wash gently with scrungie or a clean washcloth.  5.  Apply the CHG Soap to your body ONLY FROM THE NECK DOWN.        Do not use on open wounds or open sores.  Avoid contact with your eyes,       ears, mouth and genitals (private parts).  Wash genitals (private parts)       with your normal soap.  6.  Wash thoroughly, paying  special attention to the area where your surgery        will be performed.  7.  Thoroughly rinse your body with warm water from the neck down.  8.  DO NOT shower/wash with your normal soap after using and rinsing off       the CHG Soap.  9.  Pat yourself dry with a clean towel.            10.  Wear clean pajamas.            11.  Place clean sheets on your bed the night of your first shower and do not        sleep with pets.  Day of Surgery  Do not apply any lotions/deoderants the morning of surgery.  Please wear clean clothes to the hospital/surgery center.    Please read over the following fact sheets that you were given. Pain Booklet, Coughing and Deep Breathing, Blood Transfusion Information, MRSA Information and Surgical Site Infection Prevention

## 2015-08-17 ENCOUNTER — Encounter (HOSPITAL_COMMUNITY): Payer: Self-pay

## 2015-08-17 NOTE — Progress Notes (Signed)
Anesthesia Chart Review:  Pt is a 60 year old female scheduled for L total hip arthroplasty anterior approach on 08/25/2015 with Dr. Rhona Raider.   Cardiologist is Dr. Tollie Eth, last office visit 06/16/14.   PMH includes:  HTN, aortic regurgitation, hyperlipidemia, anemia, rectosigmoid cancer. Former smoker. BMI 27.5. S/p R THA 05/27/14.   Anesthesia history includes post-op N/V and "hallucinations".   Medications include: lisinopril-hctz, pravastatin.  Preoperative labs reviewed.    Chest x-ray 12/28/14 reviewed. No edema or consolidation. Stable calcification in the ascending thoracic aorta. No pneumothorax.  EKG 12/28/14: Sinus rhythm. Consider left atrial enlargement. Low voltage, precordial leads. Borderline T wave abnormalities. Baseline wander in lead(s) III aVL aVF  Echo 06/23/14:  1. Mild concentric LVH with normal LV systolic function, EF [id number] 2. Trileaflet aortic valve with moderate regurgitation 3. Mild MR 4. Mild to moderate TR 5. Trace PR  Reviewed case with Dr. Tamala Julian.   If no changes, I anticipate pt can proceed with surgery as scheduled.   Willeen Cass, FNP-BC [facility]Anesthesiology Phone: [phone] 08/17/2015 4:40 PM

## 2015-08-21 NOTE — H&P (Signed)
TOTAL HIP ADMISSION H&P  Patient is admitted for left total hip arthroplasty.  Subjective:  Chief Complaint: left hip pain  HPI: [name] [name], 60 y.o. female, has a history of pain and functional disability in the left hip(s) due to arthritis and patient has failed non-surgical conservative treatments for greater than 12 weeks to include NSAID's and/or analgesics, corticosteriod injections, flexibility and strengthening excercises, use of assistive devices, weight reduction as appropriate and activity modification.  Onset of symptoms was gradual starting 5 years ago with gradually worsening course since that time.The patient noted no past surgery on the left hip(s).  Patient currently rates pain in the left hip at 10 out of 10 with activity. Patient has night pain, worsening of pain with activity and weight bearing, trendelenberg gait, pain that interfers with activities of daily living and crepitus. Patient has evidence of subchondral cysts, subchondral sclerosis, periarticular osteophytes and joint space narrowing by imaging studies. This condition presents safety issues increasing the risk of falls. There is no current active infection.  Patient Active Problem List   Diagnosis Date Noted  . Postoperative anemia due to acute blood loss 05/29/2014  . Primary osteoarthritis of right hip 05/27/2014  . ANXIETY DISORDER- mixed disorder w/ depressive symptoms 02/07/2014  . Knee pain   . PERIPHERAL NEUROPATHY 10/13/2007  . HYPERTENSION 10/13/2007  . HERNIA, UMBILICAL [id number]  . COLONIC POLYPS, ADENOMATOUS 02/20/2003  . DIVERTICULOSIS, COLON 02/20/2003  . ADENOCARCINOMA, COLON, SIGMOID, HX OF 02/20/2003   Past Medical History  Diagnosis Date  . Neuromuscular disorder ([place])   . Depression   . High cholesterol     takes Pravastatin daily  . Rectosigmoid cancer ([place]) 2004     S/P Surgery, Chemo, Rad Tx,  (after chemo treatment menstral periods have ceased).  . Arthritis     "right hip;  left hip; most of my joints" (05/27/2014)  . Essential hypertension, benign     takes Lisinopril-HCTZ daily  . PONV (postoperative nausea and vomiting)     hallucinations  . Neuropathy ([place])     since chemo and radiation  . Joint pain   . History of colon polyps     benign  . Diverticulosis   . Nocturia   . History of blood transfusion     no abnormal reaction noted 2015  . Anemia   . Anxiety     no meds  . Insomnia     doesn't take any meds  . Aortic regurgitation     Past Surgical History  Procedure Laterality Date  . Lumbar disc surgery  Feb 2007  . Colectomy  Aug 2004    S/P Low Anterior Resxn (Sigmoid Colon)  . Back surgery    . Dilation and curettage of uterus  1991  . Joint replacement    . Total hip arthroplasty Right 05/27/2014    Procedure: RIGHT TOTAL HIP ARTHROPLASTY ANTERIOR APPROACH;  Surgeon: Hessie Dibble, MD;  Location: [place];  Service: Orthopedics;  Laterality: Right;  . Colonoscopy      No prescriptions prior to admission   No Known Allergies  Social History  Substance Use Topics  . Smoking status: Former Smoker    Types: Cigarettes  . Smokeless tobacco: Never Used     Comment: "smoked some socially in my college days"  . Alcohol Use: Yes     Comment: occasionally beer/wine    Family History  Problem Relation Age of Onset  . Adopted: Yes     Review of  Systems  Musculoskeletal: Positive for joint pain.       Left hip  All other systems reviewed and are negative.   Objective:  Physical Exam  Constitutional: She is oriented to person, place, and time. She appears well-developed and well-nourished.  HENT:  Head: Normocephalic and atraumatic.  Eyes: Pupils are equal, round, and reactive to light.  Neck: Normal range of motion.  Cardiovascular: Normal rate and regular rhythm.   Respiratory: Effort normal.  GI: Soft.  Musculoskeletal:  She is ambulating with a slightly antalgic gait on the left side.  She does have tenderness with  palpation around the left groin area.  She does have limitation with internal rotation with the left hip joint.  Lower extremity muscle strength is 5/5.  Sensation is symmetrically intact.   Neurological: She is alert and oriented to person, place, and time.  Skin: Skin is warm and dry.  Psychiatric: She has a normal mood and affect. Her behavior is normal. Judgment and thought content normal.    Vital signs in last 24 hours:    Labs:   Estimated body mass index is 28.00 kg/(m^2) as calculated from the following:   Height as of 03/30/15: 5\' 3"  (1.6 m).   Weight as of 03/30/15: 71.668 kg (158 lb).   Imaging Review Plain radiographs demonstrate severe degenerative joint disease of the left hip(s). The bone quality appears to be good for age and reported activity level.  Assessment/Plan:  End stage primary arthritis, left hip(s)  The patient history, physical examination, clinical judgement of the provider and imaging studies are consistent with end stage degenerative joint disease of the left hip(s) and total hip arthroplasty is deemed medically necessary. The treatment options including medical management, injection therapy, arthroscopy and arthroplasty were discussed at length. The risks and benefits of total hip arthroplasty were presented and reviewed. The risks due to aseptic loosening, infection, stiffness, dislocation/subluxation,  thromboembolic complications and other imponderables were discussed.  The patient acknowledged the explanation, agreed to proceed with the plan and consent was signed. Patient is being admitted for inpatient treatment for surgery, pain control, PT, OT, prophylactic antibiotics, VTE prophylaxis, progressive ambulation and ADL's and discharge planning.The patient is planning to be discharged home with home health services

## 2015-08-24 MED ORDER — CEFAZOLIN SODIUM-DEXTROSE 2-3 GM-% IV SOLR
2.0000 g | INTRAVENOUS | Status: AC
  Administered 2015-08-25: 2 g via INTRAVENOUS
  Filled 2015-08-24: qty 50

## 2015-08-24 MED ORDER — LACTATED RINGERS IV SOLN
INTRAVENOUS | Status: DC
  Administered 2015-08-25: 07:00:00 via INTRAVENOUS

## 2015-08-24 NOTE — Anesthesia Preprocedure Evaluation (Addendum)
Anesthesia Evaluation  Patient identified by MRN, date of birth, ID band Patient awake    Reviewed: Allergy & Precautions, NPO status , Patient's Chart, lab work & pertinent test results  History of Anesthesia Complications (+) PONV and history of anesthetic complications  Airway Mallampati: I  TM Distance: >3 FB Neck ROM: Full    Dental  (+) Teeth Intact   Pulmonary former smoker,    breath sounds clear to auscultation       Cardiovascular hypertension, Pt. on medications + Valvular Problems/Murmurs AI  Rhythm:Regular Rate:Normal     Neuro/Psych PSYCHIATRIC DISORDERS Anxiety Depression  Neuromuscular disease    GI/Hepatic negative GI ROS, Neg liver ROS,   Endo/Other  negative endocrine ROS  Renal/GU negative Renal ROS  negative genitourinary   Musculoskeletal  (+) Arthritis , Osteoarthritis,    Abdominal   Peds negative pediatric ROS (+)  Hematology   Anesthesia Other Findings   Reproductive/Obstetrics negative OB ROS                           Lab Results  Component Value Date   WBC 6.6 08/14/2015   HGB 13.9 08/14/2015   HCT 40.7 08/14/2015   MCV 90.8 08/14/2015   PLT 169 08/14/2015   Lab Results  Component Value Date   INR 1.03 08/14/2015   INR 1.04 05/19/2014     Anesthesia Physical Anesthesia Plan  ASA: II  Anesthesia Plan: Spinal   Post-op Pain Management:    Induction: Intravenous  Airway Management Planned:   Additional Equipment:   Intra-op Plan:   Post-operative Plan:   Informed Consent: I have reviewed the patients History and Physical, chart, labs and discussed the procedure including the risks, benefits and alternatives for the proposed anesthesia with the patient or authorized representative who has indicated his/her understanding and acceptance.   Dental advisory given  Plan Discussed with: CRNA  Anesthesia Plan Comments:          Anesthesia Quick Evaluation

## 2015-08-25 ENCOUNTER — Encounter (HOSPITAL_COMMUNITY): Payer: Self-pay | Admitting: General Practice

## 2015-08-25 ENCOUNTER — Inpatient Hospital Stay (HOSPITAL_COMMUNITY): Payer: BLUE CROSS/BLUE SHIELD | Admitting: Emergency Medicine

## 2015-08-25 ENCOUNTER — Inpatient Hospital Stay (HOSPITAL_COMMUNITY): Payer: BLUE CROSS/BLUE SHIELD

## 2015-08-25 ENCOUNTER — Encounter (HOSPITAL_COMMUNITY)
Admission: RE | Disposition: A | Discharge status: Home Health | Payer: Self-pay | Source: Ambulatory Visit | Attending: Orthopaedic Surgery

## 2015-08-25 ENCOUNTER — Inpatient Hospital Stay (HOSPITAL_COMMUNITY): Payer: BLUE CROSS/BLUE SHIELD | Admitting: Anesthesiology

## 2015-08-25 ENCOUNTER — Inpatient Hospital Stay (HOSPITAL_COMMUNITY)
Admission: RE | Admit: 2015-08-25 | Discharge: 2015-08-27 | DRG: 470 | Disposition: A | Discharge status: Home Health | Payer: BLUE CROSS/BLUE SHIELD | Source: Ambulatory Visit | Attending: Orthopaedic Surgery | Admitting: Orthopaedic Surgery

## 2015-08-25 DIAGNOSIS — M1612 Unilateral primary osteoarthritis, left hip: Principal | ICD-10-CM | POA: Diagnosis present

## 2015-08-25 DIAGNOSIS — I10 Essential (primary) hypertension: Secondary | ICD-10-CM | POA: Diagnosis present

## 2015-08-25 DIAGNOSIS — Z87891 Personal history of nicotine dependence: Secondary | ICD-10-CM

## 2015-08-25 DIAGNOSIS — Z419 Encounter for procedure for purposes other than remedying health state, unspecified: Secondary | ICD-10-CM

## 2015-08-25 DIAGNOSIS — F329 Major depressive disorder, single episode, unspecified: Secondary | ICD-10-CM | POA: Diagnosis present

## 2015-08-25 DIAGNOSIS — G629 Polyneuropathy, unspecified: Secondary | ICD-10-CM | POA: Diagnosis present

## 2015-08-25 DIAGNOSIS — M25552 Pain in left hip: Secondary | ICD-10-CM | POA: Diagnosis present

## 2015-08-25 DIAGNOSIS — Z9221 Personal history of antineoplastic chemotherapy: Secondary | ICD-10-CM

## 2015-08-25 DIAGNOSIS — Z96641 Presence of right artificial hip joint: Secondary | ICD-10-CM | POA: Diagnosis present

## 2015-08-25 DIAGNOSIS — E7800 Pure hypercholesterolemia, unspecified: Secondary | ICD-10-CM | POA: Diagnosis present

## 2015-08-25 DIAGNOSIS — Z923 Personal history of irradiation: Secondary | ICD-10-CM | POA: Diagnosis not present

## 2015-08-25 DIAGNOSIS — Z85038 Personal history of other malignant neoplasm of large intestine: Secondary | ICD-10-CM

## 2015-08-25 HISTORY — PX: TOTAL HIP ARTHROPLASTY: SHX124

## 2015-08-25 SURGERY — ARTHROPLASTY, HIP, TOTAL, ANTERIOR APPROACH
Anesthesia: Spinal | Site: Hip | Laterality: Left

## 2015-08-25 MED ORDER — METOCLOPRAMIDE HCL 5 MG/ML IJ SOLN: 5.0000 mg | Freq: Three times a day (TID) | INTRAMUSCULAR | Status: DC | PRN

## 2015-08-25 MED ORDER — SODIUM CHLORIDE 0.9 % IV SOLN
10.0000 mg | INTRAVENOUS | Status: DC | PRN
  Administered 2015-08-25: 25 ug/min via INTRAVENOUS

## 2015-08-25 MED ORDER — BISACODYL 5 MG PO TBEC: 5.0000 mg | DELAYED_RELEASE_TABLET | Freq: Every day | ORAL | Status: DC | PRN

## 2015-08-25 MED ORDER — PROMETHAZINE HCL 25 MG/ML IJ SOLN: 6.2500 mg | INTRAMUSCULAR | Status: DC | PRN

## 2015-08-25 MED ORDER — ACETAMINOPHEN 650 MG RE SUPP: 650.0000 mg | Freq: Four times a day (QID) | RECTAL | Status: DC | PRN

## 2015-08-25 MED ORDER — HYDROMORPHONE HCL 1 MG/ML IJ SOLN
0.2500 mg | INTRAMUSCULAR | Status: DC | PRN
  Administered 2015-08-25 (×2): 0.5 mg via INTRAVENOUS

## 2015-08-25 MED ORDER — DOCUSATE SODIUM 100 MG PO CAPS
100.0000 mg | ORAL_CAPSULE | Freq: Two times a day (BID) | ORAL | Status: DC
  Administered 2015-08-25 – 2015-08-27 (×5): 100 mg via ORAL
  Filled 2015-08-25 (×5): qty 1

## 2015-08-25 MED ORDER — HYDROMORPHONE HCL 1 MG/ML IJ SOLN
INTRAMUSCULAR | Status: AC
  Administered 2015-08-25: 0.5 mg via INTRAVENOUS
  Filled 2015-08-25: qty 1

## 2015-08-25 MED ORDER — BUPIVACAINE LIPOSOME 1.3 % IJ SUSP
20.0000 mL | INTRAMUSCULAR | Status: AC
  Administered 2015-08-25: 20 mL
  Filled 2015-08-25: qty 20

## 2015-08-25 MED ORDER — BUPIVACAINE IN DEXTROSE 0.75-8.25 % IT SOLN
INTRATHECAL | Status: DC | PRN
  Administered 2015-08-25: 2 mL via INTRATHECAL

## 2015-08-25 MED ORDER — METHOCARBAMOL 1000 MG/10ML IJ SOLN: 500.0000 mg | Freq: Four times a day (QID) | INTRAVENOUS | Status: DC | PRN

## 2015-08-25 MED ORDER — FENTANYL CITRATE (PF) 250 MCG/5ML IJ SOLN
INTRAMUSCULAR | Status: AC
  Filled 2015-08-25: qty 5

## 2015-08-25 MED ORDER — HYDROMORPHONE HCL 1 MG/ML IJ SOLN
0.5000 mg | INTRAMUSCULAR | Status: DC | PRN
  Administered 2015-08-26: 1 mg via INTRAVENOUS
  Filled 2015-08-25: qty 1

## 2015-08-25 MED ORDER — LISINOPRIL 40 MG PO TABS
40.0000 mg | ORAL_TABLET | Freq: Every day | ORAL | Status: DC
  Administered 2015-08-25 – 2015-08-27 (×2): 40 mg via ORAL
  Filled 2015-08-25 (×3): qty 1

## 2015-08-25 MED ORDER — TRANEXAMIC ACID 1000 MG/10ML IV SOLN
1000.0000 mg | INTRAVENOUS | Status: AC
  Administered 2015-08-25: 1000 mg via INTRAVENOUS
  Filled 2015-08-25: qty 10

## 2015-08-25 MED ORDER — PHENOL 1.4 % MT LIQD: 1.0000 | OROMUCOSAL | Status: DC | PRN

## 2015-08-25 MED ORDER — PRAVASTATIN SODIUM 40 MG PO TABS
80.0000 mg | ORAL_TABLET | Freq: Every day | ORAL | Status: DC
  Administered 2015-08-25 – 2015-08-27 (×3): 80 mg via ORAL
  Filled 2015-08-25 (×3): qty 2

## 2015-08-25 MED ORDER — LISINOPRIL-HYDROCHLOROTHIAZIDE 20-12.5 MG PO TABS: 2.0000 | ORAL_TABLET | Freq: Every day | ORAL | Status: DC

## 2015-08-25 MED ORDER — PROPOFOL 10 MG/ML IV BOLUS
INTRAVENOUS | Status: DC | PRN
  Administered 2015-08-25: 20 mg via INTRAVENOUS

## 2015-08-25 MED ORDER — LACTATED RINGERS IV SOLN
INTRAVENOUS | Status: DC
  Administered 2015-08-25 – 2015-08-26 (×2): via INTRAVENOUS

## 2015-08-25 MED ORDER — METOCLOPRAMIDE HCL 5 MG PO TABS: 5.0000 mg | ORAL_TABLET | Freq: Three times a day (TID) | ORAL | Status: DC | PRN

## 2015-08-25 MED ORDER — CEFAZOLIN SODIUM-DEXTROSE 2-3 GM-% IV SOLR
2.0000 g | Freq: Four times a day (QID) | INTRAVENOUS | Status: AC
  Administered 2015-08-25 (×2): 2 g via INTRAVENOUS
  Filled 2015-08-25 (×2): qty 50

## 2015-08-25 MED ORDER — BUPIVACAINE-EPINEPHRINE (PF) 0.25% -1:200000 IJ SOLN
INTRAMUSCULAR | Status: AC
  Filled 2015-08-25: qty 30

## 2015-08-25 MED ORDER — HYDROCHLOROTHIAZIDE 25 MG PO TABS
25.0000 mg | ORAL_TABLET | Freq: Every day | ORAL | Status: DC
  Administered 2015-08-25 – 2015-08-27 (×3): 25 mg via ORAL
  Filled 2015-08-25 (×3): qty 1

## 2015-08-25 MED ORDER — ASPIRIN EC 325 MG PO TBEC
325.0000 mg | DELAYED_RELEASE_TABLET | Freq: Two times a day (BID) | ORAL | Status: DC
  Administered 2015-08-26 – 2015-08-27 (×4): 325 mg via ORAL
  Filled 2015-08-25 (×4): qty 1

## 2015-08-25 MED ORDER — 0.9 % SODIUM CHLORIDE (POUR BTL) OPTIME
TOPICAL | Status: DC | PRN
  Administered 2015-08-25: 1000 mL

## 2015-08-25 MED ORDER — ONDANSETRON HCL 4 MG PO TABS: 4.0000 mg | ORAL_TABLET | Freq: Four times a day (QID) | ORAL | Status: DC | PRN

## 2015-08-25 MED ORDER — ALUM & MAG HYDROXIDE-SIMETH 200-200-20 MG/5ML PO SUSP: 30.0000 mL | ORAL | Status: DC | PRN

## 2015-08-25 MED ORDER — ONDANSETRON HCL 4 MG/2ML IJ SOLN
INTRAMUSCULAR | Status: AC
  Filled 2015-08-25: qty 2

## 2015-08-25 MED ORDER — MIDAZOLAM HCL 5 MG/5ML IJ SOLN
INTRAMUSCULAR | Status: DC | PRN
  Administered 2015-08-25: 2 mg via INTRAVENOUS

## 2015-08-25 MED ORDER — MEPERIDINE HCL 25 MG/ML IJ SOLN: 6.2500 mg | INTRAMUSCULAR | Status: DC | PRN

## 2015-08-25 MED ORDER — ACETAMINOPHEN 325 MG PO TABS
650.0000 mg | ORAL_TABLET | Freq: Four times a day (QID) | ORAL | Status: DC | PRN
Start: 2015-08-25 — End: 2015-08-27

## 2015-08-25 MED ORDER — PROPOFOL 10 MG/ML IV BOLUS
INTRAVENOUS | Status: AC
  Filled 2015-08-25: qty 20

## 2015-08-25 MED ORDER — PROPOFOL 500 MG/50ML IV EMUL
INTRAVENOUS | Status: DC | PRN
  Administered 2015-08-25: 50 ug/kg/min via INTRAVENOUS

## 2015-08-25 MED ORDER — ONDANSETRON HCL 4 MG/2ML IJ SOLN
4.0000 mg | Freq: Four times a day (QID) | INTRAMUSCULAR | Status: DC | PRN
  Administered 2015-08-25: 4 mg via INTRAVENOUS
  Filled 2015-08-25: qty 2

## 2015-08-25 MED ORDER — DIPHENHYDRAMINE HCL 12.5 MG/5ML PO ELIX: 12.5000 mg | ORAL_SOLUTION | ORAL | Status: DC | PRN

## 2015-08-25 MED ORDER — SODIUM CHLORIDE 0.9 % IR SOLN
Status: DC | PRN
  Administered 2015-08-25: 1000 mL

## 2015-08-25 MED ORDER — MENTHOL 3 MG MT LOZG: 1.0000 | LOZENGE | OROMUCOSAL | Status: DC | PRN

## 2015-08-25 MED ORDER — HYDROCODONE-ACETAMINOPHEN 5-325 MG PO TABS
1.0000 | ORAL_TABLET | ORAL | Status: DC | PRN
  Administered 2015-08-25 – 2015-08-27 (×9): 2 via ORAL
  Filled 2015-08-25 (×10): qty 2

## 2015-08-25 MED ORDER — ONDANSETRON HCL 4 MG/2ML IJ SOLN
INTRAMUSCULAR | Status: DC | PRN
  Administered 2015-08-25: 4 mg via INTRAVENOUS

## 2015-08-25 MED ORDER — LIDOCAINE HCL (CARDIAC) 20 MG/ML IV SOLN
INTRAVENOUS | Status: AC
  Filled 2015-08-25: qty 5

## 2015-08-25 MED ORDER — DEXAMETHASONE SODIUM PHOSPHATE 4 MG/ML IJ SOLN
INTRAMUSCULAR | Status: DC | PRN
  Administered 2015-08-25: 4 mg via INTRAVENOUS

## 2015-08-25 MED ORDER — METHOCARBAMOL 500 MG PO TABS
500.0000 mg | ORAL_TABLET | Freq: Four times a day (QID) | ORAL | Status: DC | PRN
  Administered 2015-08-26 – 2015-08-27 (×5): 500 mg via ORAL
  Filled 2015-08-25 (×5): qty 1

## 2015-08-25 MED ORDER — LACTATED RINGERS IV SOLN: INTRAVENOUS | Status: DC

## 2015-08-25 MED ORDER — DEXAMETHASONE SODIUM PHOSPHATE 4 MG/ML IJ SOLN
INTRAMUSCULAR | Status: AC
  Filled 2015-08-25: qty 1

## 2015-08-25 MED ORDER — MIDAZOLAM HCL 2 MG/2ML IJ SOLN
INTRAMUSCULAR | Status: AC
  Filled 2015-08-25: qty 2

## 2015-08-25 MED ORDER — INFLUENZA VAC SPLIT QUAD 0.5 ML IM SUSY
0.5000 mL | PREFILLED_SYRINGE | INTRAMUSCULAR | Status: AC
  Administered 2015-08-26: 0.5 mL via INTRAMUSCULAR
  Filled 2015-08-25: qty 0.5

## 2015-08-25 MED ORDER — BUPIVACAINE-EPINEPHRINE 0.25% -1:200000 IJ SOLN
INTRAMUSCULAR | Status: DC | PRN
  Administered 2015-08-25: 20 mL

## 2015-08-25 MED ORDER — FENTANYL CITRATE (PF) 100 MCG/2ML IJ SOLN
INTRAMUSCULAR | Status: DC | PRN
  Administered 2015-08-25: 50 ug via INTRAVENOUS

## 2015-08-25 SURGICAL SUPPLY — 54 items
BLADE SAW SGTL 18X1.27X75 (BLADE) ×2 IMPLANT
BLADE SAW SGTL 18X1.27X75MM (BLADE) ×1
CAPT HIP TOTAL 2 ×3 IMPLANT
CELLS DAT CNTRL 66122 CELL SVR (MISCELLANEOUS) ×1 IMPLANT
CLOSURE WOUND 1/2 X4 (GAUZE/BANDAGES/DRESSINGS) ×1
COVER PERINEAL POST (MISCELLANEOUS) ×3 IMPLANT
COVER SURGICAL LIGHT HANDLE (MISCELLANEOUS) ×3 IMPLANT
DRAPE C-ARM 42X72 X-RAY (DRAPES) ×3 IMPLANT
DRAPE IMP U-DRAPE 54X76 (DRAPES) ×3 IMPLANT
DRAPE POUCH INSTRU U-SHP 10X18 (DRAPES) ×3 IMPLANT
DRAPE STERI IOBAN 125X83 (DRAPES) ×3 IMPLANT
DRAPE SURG IRRIG POUCH 19X23 (DRAPES) ×3 IMPLANT
DRAPE U-SHAPE 47X51 STRL (DRAPES) ×9 IMPLANT
DRSG AQUACEL AG ADV 3.5X10 (GAUZE/BANDAGES/DRESSINGS) ×3 IMPLANT
DURAPREP 26ML APPLICATOR (WOUND CARE) ×3 IMPLANT
ELECT BLADE 4.0 EZ CLEAN MEGAD (MISCELLANEOUS) ×3
ELECT CAUTERY BLADE 6.4 (BLADE) ×3 IMPLANT
ELECT REM PT RETURN 9FT ADLT (ELECTROSURGICAL) ×3
ELECTRODE BLDE 4.0 EZ CLN MEGD (MISCELLANEOUS) ×1 IMPLANT
ELECTRODE REM PT RTRN 9FT ADLT (ELECTROSURGICAL) ×1 IMPLANT
FACESHIELD WRAPAROUND (MASK) ×6 IMPLANT
GLOVE BIO SURGEON STRL SZ8 (GLOVE) ×6 IMPLANT
GLOVE BIOGEL PI IND STRL 8 (GLOVE) ×2 IMPLANT
GLOVE BIOGEL PI INDICATOR 8 (GLOVE) ×4
GOWN STRL REUS W/ TWL LRG LVL3 (GOWN DISPOSABLE) ×1 IMPLANT
GOWN STRL REUS W/ TWL XL LVL3 (GOWN DISPOSABLE) ×2 IMPLANT
GOWN STRL REUS W/TWL LRG LVL3 (GOWN DISPOSABLE) ×2
GOWN STRL REUS W/TWL XL LVL3 (GOWN DISPOSABLE) ×4
KIT BASIN OR (CUSTOM PROCEDURE TRAY) ×3 IMPLANT
KIT ROOM TURNOVER OR (KITS) ×3 IMPLANT
LINER BOOT UNIVERSAL DISP (MISCELLANEOUS) ×3 IMPLANT
MANIFOLD NEPTUNE II (INSTRUMENTS) ×3 IMPLANT
NEEDLE 22X1 1/2 (OR ONLY) (NEEDLE) ×3 IMPLANT
NS IRRIG 1000ML POUR BTL (IV SOLUTION) ×3 IMPLANT
PACK TOTAL JOINT (CUSTOM PROCEDURE TRAY) ×3 IMPLANT
PACK UNIVERSAL I (CUSTOM PROCEDURE TRAY) ×3 IMPLANT
PAD ARMBOARD 7.5X6 YLW CONV (MISCELLANEOUS) ×6 IMPLANT
RTRCTR WOUND ALEXIS 18CM MED (MISCELLANEOUS) ×3
SEALER BIPOLAR AQUA 6.0 (INSTRUMENTS) ×3 IMPLANT
STAPLER VISISTAT 35W (STAPLE) ×3 IMPLANT
STRIP CLOSURE SKIN 1/2X4 (GAUZE/BANDAGES/DRESSINGS) ×2 IMPLANT
SUT ETHIBOND NAB CT1 #1 30IN (SUTURE) ×9 IMPLANT
SUT MNCRL AB 3-0 PS2 18 (SUTURE) ×3 IMPLANT
SUT VIC AB 0 CT1 27 (SUTURE) ×2
SUT VIC AB 0 CT1 27XBRD ANBCTR (SUTURE) ×1 IMPLANT
SUT VIC AB 1 CT1 27 (SUTURE) ×2
SUT VIC AB 1 CT1 27XBRD ANBCTR (SUTURE) ×1 IMPLANT
SUT VIC AB 2-0 CT1 27 (SUTURE) ×2
SUT VIC AB 2-0 CT1 TAPERPNT 27 (SUTURE) ×1 IMPLANT
SUT VLOC 180 0 24IN GS25 (SUTURE) ×3 IMPLANT
SYR 50ML LL SCALE MARK (SYRINGE) ×3 IMPLANT
TOWEL OR 17X24 6PK STRL BLUE (TOWEL DISPOSABLE) ×3 IMPLANT
TOWEL OR 17X26 10 PK STRL BLUE (TOWEL DISPOSABLE) ×6 IMPLANT
WATER STERILE IRR 1000ML POUR (IV SOLUTION) ×6 IMPLANT

## 2015-08-25 NOTE — Op Note (Signed)

## 2015-08-25 NOTE — Progress Notes (Signed)
Report given to jena rn as caregiver 

## 2015-08-25 NOTE — Evaluation (Signed)
Physical Therapy Evaluation Patient Details Name: [name] [name] MRN: [id number] DOB: September 10, 1955 Today's Date: 08/25/2015   History of Present Illness  The patient is a 60 y.o. female with a long history of a painful hip. This has persisted despite multiple conservative measures. The patient has persisted with pain and dysfunction making rest and activity difficult. A total hip replacement is offered as surgical treatment.  Clinical Impression  Pt presents POD #0 with the PT deficits listed below. PTA pt was independent in all ADLs mobility. Requiring Min A for transfers today. Limited by dizziness and nausea this session which improved once situated in chair. Educated pt on HEP and she displayed proper recall of all exercises. Will continue to follow pt acutely to progress gait and stair training for D/C with HHPT and assistance from husband.    Follow Up Recommendations Home health PT;Supervision - Intermittent    Equipment Recommendations  None recommended by PT    Recommendations for Other Services       Precautions / Restrictions Precautions Precautions: Fall Precaution Comments: direct anterior approach, no precautions Restrictions Weight Bearing Restrictions: Yes LLE Weight Bearing: Weight bearing as tolerated      Mobility  Bed Mobility Overal bed mobility: Needs Assistance Bed Mobility: Supine to Sit     Supine to sit: Supervision     General bed mobility comments: HOB elevated  Transfers Overall transfer level: Needs assistance Equipment used: Rolling walker (2 wheeled) Transfers: Sit to/from [facility] Sit to Stand: Min guard Stand pivot transfers: Min assist       General transfer comment: VC's for weight shifting and sequencing for LLE movement. standing from [facility] and EOB  Ambulation/Gait                Stairs            Wheelchair Mobility    Modified Rankin (Stroke Patients Only)       Balance Overall balance  assessment: Needs assistance Sitting-balance support: No upper extremity supported;Feet supported Sitting balance-Leahy Scale: Good     Standing balance support: Bilateral upper extremity supported Standing balance-Leahy Scale: Fair                               Pertinent Vitals/Pain Pain Assessment: 0-10 Pain Score: 7  Pain Location: LLE Pain Intervention(s): Monitored during session;Ice applied    Home Living Family/patient expects to be discharged to:: Private residence Living Arrangements: Spouse/significant other Available Help at Discharge: Family;Available 24 hours/day;Neighbor Type of Home: House Home Access: Stairs to enter Entrance Stairs-Rails: Right Entrance Stairs-Number of Steps: 5 Home Layout: Two level Home Equipment: Walker - 2 wheels;Bedside commode;Hospital bed;Cane - single point Additional Comments: sounds like she has plastic chair she can use as shower chair    Prior Function Level of Independence: Independent               Hand Dominance        Extremity/Trunk Assessment   Upper Extremity Assessment: Defer to OT evaluation           Lower Extremity Assessment: LLE deficits/detail   LLE Deficits / Details: fair quad contraction, decreased mobility due to numbness and surgical site     Communication   Communication: No difficulties  Cognition Arousal/Alertness: Awake/alert Behavior During Therapy: WFL for tasks assessed/performed Overall Cognitive Status: Within Functional Limits for tasks assessed  General Comments General comments (skin integrity, edema, etc.): nausea with activtiy, BP 141/63 after activtiy    Exercises Total Joint Exercises Ankle Circles/Pumps: AROM;Left;Supine;10 reps Quad Sets: AROM;Both;10 reps;Supine Gluteal Sets: AROM;Both;10 reps;Supine Heel Slides: AROM;Both;10 reps;Supine Long Arc Quad: AROM;5 reps;Both;Seated      Assessment/Plan    PT Assessment  Patient needs continued PT services  PT Diagnosis Difficulty walking;Acute pain   PT Problem List Decreased range of motion;Decreased activity tolerance;Decreased balance;Decreased mobility;Decreased knowledge of use of DME  PT Treatment Interventions DME instruction;Gait training;Stair training;Functional mobility training;Therapeutic activities;Therapeutic exercise;Balance training;Patient/family education   PT Goals (Current goals can be found in the Care Plan section) Acute Rehab PT Goals Patient Stated Goal: feel better PT Goal Formulation: With patient Time For Goal Achievement: 09/08/15 Potential to Achieve Goals: Good    Frequency 7X/week   Barriers to discharge        Co-evaluation               End of Session Equipment Utilized During Treatment: Gait belt Activity Tolerance: Patient tolerated treatment well Patient left: in chair;with call bell/phone within reach Nurse Communication: Mobility status         Time: [id number] PT Time Calculation (min) (ACUTE ONLY): 28 min   Charges:   PT Evaluation $PT Eval Moderate Complexity: 1 Procedure PT Treatments $Therapeutic Activity: 8-22 mins   PT G Codes:        Ara Kussmaul Aug 28, 2015, 4:40 PM  Ara Kussmaul, Student Physical Therapist Acute Rehab [phone]

## 2015-08-25 NOTE — Anesthesia Postprocedure Evaluation (Signed)
Anesthesia Post Note  Patient: [name] [name]  Procedure(s) Performed: Procedure(s) (LRB): TOTAL HIP ARTHROPLASTY ANTERIOR APPROACH (Left)  Patient location during evaluation: PACU Anesthesia Type: Spinal Level of consciousness: oriented and awake and alert Pain management: pain level controlled Vital Signs Assessment: post-procedure vital signs reviewed and stable Respiratory status: spontaneous breathing, respiratory function stable and patient connected to nasal cannula oxygen Cardiovascular status: blood pressure returned to baseline and stable Postop Assessment: no headache, no backache and spinal receding Anesthetic complications: no    Last Vitals:  Filed Vitals:   08/25/15 1151 08/25/15 1225  BP: 121/66 122/63  Pulse: 73 76  Temp:  36.7 C  Resp: 12 16    Last Pain:  Filed Vitals:   08/25/15 1225  PainSc: [place] Adelbert Gaspard

## 2015-08-25 NOTE — Interval H&P Note (Signed)
OK for surgery PD 

## 2015-08-25 NOTE — Anesthesia Procedure Notes (Addendum)
Spinal Patient location during procedure: OR Start time: 08/25/2015 7:30 AM End time: 08/25/2015 7:32 AM Staffing Anesthesiologist: Suella Broad D Performed by: anesthesiologist  Preanesthetic Checklist Completed: patient identified, site marked, surgical consent, pre-op evaluation, timeout performed, IV checked, risks and benefits discussed and monitors and equipment checked Spinal Block Patient position: sitting Prep: Betadine Patient monitoring: heart rate, continuous pulse ox, blood pressure and cardiac monitor Approach: midline Location: L4-5 Injection technique: single-shot Needle Needle type: [place] and Introducer  Needle gauge: 24 G Needle length: 9 cm Additional Notes Negative paresthesia. Negative blood return. Positive free-flowing CSF. Expiration date of kit checked and confirmed. Patient tolerated procedure well, without complications.    Procedure Name: MAC Date/Time: 08/25/2015 7:38 AM Performed by: Kyung Rudd Pre-anesthesia Checklist: Patient identified, Emergency Drugs available, Suction available, Patient being monitored and Timeout performed Patient Re-evaluated:Patient Re-evaluated prior to inductionOxygen Delivery Method: Simple face mask Intubation Type: IV induction Placement Confirmation: positive ETCO2

## 2015-08-25 NOTE — Care Management (Signed)
Utilization review completed. Darean Rote, RN Case Manager [phone]. 

## 2015-08-25 NOTE — Transfer of Care (Signed)
Immediate Anesthesia Transfer of Care Note  Patient: [name] [name]  Procedure(s) Performed: Procedure(s): TOTAL HIP ARTHROPLASTY ANTERIOR APPROACH (Left)  Patient Location: PACU  Anesthesia Type:Spinal  Level of Consciousness: awake, alert  and oriented  Airway & Oxygen Therapy: Patient Spontanous Breathing and Patient connected to nasal cannula oxygen  Post-op Assessment: Report given to RN and Post -op Vital signs reviewed and stable  Post vital signs: Reviewed and stable  Last Vitals:  Filed Vitals:   08/25/15 0627  BP: 141/58  Pulse: 80  Temp: 36.9 C  Resp: 18    Complications: No apparent anesthesia complications

## 2015-08-25 NOTE — Care Management (Deleted)
PRE-OP DIAGNOSIS: LEFT HIP DEGENERATIVE JOINT DISEASE POST-OP DIAGNOSIS: same PROCEDURE: LEFT TOTAL HIP ARTHROPLASTY ANTERIOR APPROACH ANESTHESIA: Spinal and MAC SURGEON: Melrose Nakayama MD ASSISTANT: Loni Dolly PA-C

## 2015-08-25 NOTE — Progress Notes (Addendum)
OT evaluation    08/25/15 1555  OT Visit Information  Last OT Received On 08/25/15  Assistance Needed +1  History of Present Illness 60 y.o. s/p left THA-direct anterior approach. PMH includes back surgery, colectomy, Rt THA, insomnia, anxiety, anemia, diverticulosis, depression, arthritis, joint pain, neuropathy, high cholesterol, neuromuscular disorder, rectosigmoid cancer.  Precautions  Precautions Fall  Restrictions  Weight Bearing Restrictions Yes  LLE Weight Bearing WBAT  Home Living  Family/patient expects to be discharged to: Private residence  Living Arrangements Spouse/significant other  Available Help at Discharge Family (spouse working out of town tomorrow (Wed))  Type of [place] to enter  [employer] of Steps 5  Entrance Stairs-Rails Right  [place] Two level  Alternate Level Stairs-Number of Steps 14  Alternate Level Stairs-Rails Can reach both  [employer] (standard and elevated toilets)  Home Equipment Walker - 2 wheels;Hospital bed;Cane - single point;Toilet riser;Database administrator aid  Additional Comments sounds like she has plastic chair she can use as shower chair  Prior Function  Level of Independence Independent  Communication  Communication No difficulties  Pain Assessment  Pain Assessment 0-10  Pain Score 7  Pain Location LLE and in back when OT tested UE strength  Pain Intervention(s) Ice applied;Monitored during session;Repositioned  Cognition  Arousal/Alertness Awake/alert  Behavior During Therapy WFL for tasks assessed/performed  Overall Cognitive Status Within Functional Limits for tasks assessed  Upper Extremity Assessment  Upper Extremity Assessment Generalized weakness-decreased strength in bilateral shoulder flexors; WFL for tasks assessed  Lower Extremity Assessment  Lower Extremity Assessment Defer to PT evaluation  ADL  Overall  ADL's  Needs assistance/impaired  Lower Body Dressing Minimal assistance;Sit to/from Environmental education officer guard;Ambulation;RW (sit to stand from chair)  Functional mobility during ADLs Min guard;Rolling walker  General ADL Comments Educated on LB dressing technique. Educated on safety such as sitting for LB ADLs, use of bag on walker, rugs/items on floor, recommended someone be with her for shower transfer. Pt planning to sponge bathe initially-verbalized she would like to review shower transfer.   Bed Mobility  General bed mobility comments not assessed  Transfers  Overall transfer level Needs assistance  Equipment used Rolling walker (2 wheeled)  Transfers Sit to/from Stand  Sit to Stand Min guard  OT - End of Session  Equipment Utilized During Treatment Rolling walker  Activity Tolerance Patient tolerated treatment well  Patient left in chair;with call bell/phone within reach;with family/visitor present  Nurse Communication Other (comment) (pt wanting nausea meds-called prior to session)  OT Assessment  OT Therapy Diagnosis  Acute pain  OT Recommendation/Assessment Patient needs continued OT Services  OT Problem List Decreased strength;Pain;Decreased knowledge of precautions;Decreased knowledge of use of DME or AE;Impaired balance (sitting and/or standing);Decreased range of motion;Decreased activity tolerance  OT Plan  OT Frequency (ACUTE ONLY) Min 2X/week  OT Treatment/Interventions (ACUTE ONLY) Self-care/ADL training;DME and/or AE instruction;Therapeutic activities;Patient/family education;Balance training;Therapeutic exercise  OT Recommendation  Follow Up Recommendations No OT follow up;Supervision - Intermittent  OT Equipment None recommended by OT  Individuals Consulted  Consulted and Agree with Results and Recommendations Patient  Acute Rehab OT Goals  Patient Stated Goal not stated  OT Goal Formulation With patient  Time For Goal Achievement 09/01/15  Potential to  Achieve Goals Good  OT Time Calculation  OT Start Time (ACUTE ONLY) 1529  OT Stop Time (ACUTE ONLY) 1550  OT Time Calculation (min) 21  min  OT General Charges  $OT Visit 1 Procedure  OT Evaluation  $OT Eval Moderate Complexity 1 Procedure    Roseanne Reno, OTR/L [phone]

## 2015-08-26 ENCOUNTER — Encounter (HOSPITAL_COMMUNITY): Payer: Self-pay | Admitting: Orthopaedic Surgery

## 2015-08-26 NOTE — Progress Notes (Signed)
Subjective: 1 Day Post-Op Procedure(s) (LRB): TOTAL HIP ARTHROPLASTY ANTERIOR APPROACH (Left)  Activity level:  wbat Diet tolerance:  ok Voiding:  ok Patient reports pain as mild.    Objective: Vital signs in last 24 hours: Temp:  [97.5 F (36.4 C)-98.2 F (36.8 C)] 98.1 F (36.7 C) (02/15 0344) Pulse Rate:  [49-81] 68 (02/15 0344) Resp:  [10-16] 16 (02/15 0344) BP: (96-136)/(42-66) 106/42 mmHg (02/15 0344) SpO2:  [95 %-100 %] 99 % (02/15 0344)  Labs: No results for input(s): HGB in the last 72 hours. No results for input(s): WBC, RBC, HCT, PLT in the last 72 hours. No results for input(s): NA, K, CL, CO2, BUN, CREATININE, GLUCOSE, CALCIUM in the last 72 hours. No results for input(s): LABPT, INR in the last 72 hours.  Physical Exam:  Neurologically intact ABD soft Neurovascular intact Sensation intact distally Intact pulses distally Dorsiflexion/Plantar flexion intact Incision: dressing C/D/I and no drainage No cellulitis present Compartment soft  Assessment/Plan:  1 Day Post-Op Procedure(s) (LRB): TOTAL HIP ARTHROPLASTY ANTERIOR APPROACH (Left) Advance diet Up with therapy D/C IV fluids Plan for discharge tomorrow Discharge home with home health if doing well and cleared by PT. Continue on ASA 325mg  BID x 4 weeks post op. Keep bandage clean and dry until follow up. Follow up in office 2 weeks post op.    [name] [name], [name] [name] 08/26/2015, 7:33 AM

## 2015-08-26 NOTE — Progress Notes (Signed)
Physical Therapy Treatment Patient Details Name: [name] [name] MRN: [id number] DOB: May 16, 1956 Today's Date: 08/26/2015    History of Present Illness The patient is a 60 y.o. female with a long history of a painful hip. This has persisted despite multiple conservative measures. The patient has persisted with pain and dysfunction making rest and activity difficult. A total hip replacement is offered as surgical treatment.    PT Comments    Stair training completed. Pt continues to progress well with PT. Continue to progress as tolerated with anticipated d/c home with HHPT.   Follow Up Recommendations  Home health PT;Supervision - Intermittent     Equipment Recommendations  None recommended by PT    Recommendations for Other Services       Precautions / Restrictions Precautions Precautions: Fall Precaution Comments: direct anterior approach, no precautions Restrictions Weight Bearing Restrictions: Yes LLE Weight Bearing: Weight bearing as tolerated    Mobility  Bed Mobility Overal bed mobility: Needs Assistance Bed Mobility: Supine to Sit;Sit to Supine     Supine to sit: Supervision Sit to supine: Supervision   General bed mobility comments: supervision for safety; HOB slightly elevated but no use of bed rails; increased time needed; use of hook technique with R LE to bring L LE into bed; pt declined sitting in chair again this session but did educate pt on benefits of sitting upright and being OOB as much as possible  Transfers Overall transfer level: Needs assistance Equipment used: Rolling walker (2 wheeled) Transfers: Sit to/from Stand Sit to Stand: Supervision         General transfer comment: vc for safe hand placement and technique; pt with tendency to sit a little prematurely and educated on increased safety awareness   Ambulation/Gait Ambulation/Gait assistance: Supervision Ambulation Distance (Feet): 150 Feet Assistive device: Rolling walker (2  wheeled) Gait Pattern/deviations: Step-through pattern;Decreased stride length   Gait velocity interpretation: Below normal speed for age/gender General Gait Details: less reliance on RW and with improved gait mechanics and symmetrical step lengths this session   Stairs Stairs: Yes Stairs assistance: Min guard Stair Management: One rail Left;Sideways;Forwards;Two rails Number of Stairs: 4 (2X2) General stair comments: educated pt on technique and sequencing; pt with good safety awareness; min guard for safety; practiced with bilat hand rails first becuase pt has two rails at home but not sure if she can use them at the same time; thus practiced with L rail going sideways for second option; pt did well both ways  Wheelchair Mobility    Modified Rankin (Stroke Patients Only)       Balance Overall balance assessment: Needs assistance Sitting-balance support: No upper extremity supported;Feet supported Sitting balance-Leahy Scale: Good     Standing balance support: Single extremity supported Standing balance-Leahy Scale: Fair                      Cognition Arousal/Alertness: Awake/alert Behavior During Therapy: WFL for tasks assessed/performed Overall Cognitive Status: Within Functional Limits for tasks assessed                      Exercises Total Joint Exercises Quad Sets: AROM;Both;10 reps;Supine Gluteal Sets: AROM;Both;10 reps;Supine Heel Slides: AROM;10 reps;Supine;Left Hip ABduction/ADduction: AROM;Left;10 reps;Supine    General Comments        Pertinent Vitals/Pain Pain Assessment: 0-10 Pain Score: 5  Pain Location: L LE Pain Descriptors / Indicators: Sore Pain Intervention(s): Monitored during session;Premedicated before session;Repositioned;Ice applied    Home Living  Prior Function            PT Goals (current goals can now be found in the care plan section) Acute Rehab PT Goals Patient Stated Goal: get  back to work in 4 weeks Progress towards PT goals: Progressing toward goals    Frequency  7X/week    PT Plan Current plan remains appropriate    Co-evaluation             End of Session Equipment Utilized During Treatment: Gait belt Activity Tolerance: Patient tolerated treatment well Patient left: in bed;with call bell/phone within reach     Time: [id number] PT Time Calculation (min) (ACUTE ONLY): 18 min  Charges:  $Gait Training: 8-22 mins $Therapeutic Exercise: 8-22 mins $Therapeutic Activity: 8-22 mins                    G Codes:      [place] April, PTA Pager: [phone]   08/26/2015, 4:08 PM

## 2015-08-26 NOTE — Care Management Note (Signed)
Case Management Note  Patient Details  Name: [name] [name] MRN: [id number] Date of Birth: May 10, 1956  Subjective/Objective:    60 yr old [place]  Female, s/p left total hip arthroplasty.                Action/Plan: Case manager spoke with patient at the bedside concerning home health and DME needs at discharge. Patient was preoperatively setup with [facility], no changes to that. Patient states she has rolling walker and 3in1 from previous surgery. A hospital bed was delivered to her home.  She will have family support and assistance at discharge   Expected Discharge Date:   08/26/15               Expected Discharge Plan:  [place]  In-House Referral:  NA  Discharge planning Services  CM Consult  Post Acute Care Choice:  Home Health Choice offered to:  Patient  DME Arranged:  N/A DME Agency:  NA  HH Arranged:  PT [place] Agency:  [facility]  Status of Service:  Completed, signed off  Medicare Important Message Given:    Date Medicare IM Given:    Medicare IM give by:    Date Additional Medicare IM Given:    Additional Medicare Important Message give by:     If discussed at [place] of Stay Meetings, dates discussed:    Additional Comments:  Ninfa Meeker, RN 08/26/2015, 11:27 AM

## 2015-08-26 NOTE — Progress Notes (Signed)
Physical Therapy Treatment Patient Details Name: [name] [name] MRN: [id number] DOB: 08/27/1955 Today's Date: 08/26/2015    History of Present Illness The patient is a 60 y.o. female with a long history of a painful hip. This has persisted despite multiple conservative measures. The patient has persisted with pain and dysfunction making rest and activity difficult. A total hip replacement is offered as surgical treatment.    PT Comments    Patient is progressing well toward mobility goals. Patient needs to practice stairs next session.      Follow Up Recommendations  Home health PT;Supervision - Intermittent     Equipment Recommendations  None recommended by PT    Recommendations for Other Services       Precautions / Restrictions Precautions Precautions: Fall Precaution Comments: direct anterior approach, no precautions Restrictions Weight Bearing Restrictions: Yes LLE Weight Bearing: Weight bearing as tolerated    Mobility  Bed Mobility Overal bed mobility: Needs Assistance Bed Mobility: Supine to Sit;Sit to Supine     Supine to sit: Supervision Sit to supine: Supervision   General bed mobility comments: HOB flat and min use of bed rails; vc for sequencing and technique; R LE assist to bring L LE into bed  Transfers Overall transfer level: Needs assistance Equipment used: Rolling walker (2 wheeled) Transfers: Sit to/from Stand Sit to Stand: Supervision         General transfer comment: vc for safe hand placement; supervision for safety from 3 in 1 and EOB  Ambulation/Gait Ambulation/Gait assistance: Supervision Ambulation Distance (Feet): 150 Feet Assistive device: Rolling walker (2 wheeled) Gait Pattern/deviations: Step-through pattern;Decreased stride length;Antalgic   Gait velocity interpretation: Below normal speed for age/gender General Gait Details: pt with heavy reliance on RW and encouraged to increase WB on L LE; pt with improved bilat step  length and ability to WS to L LE after inital ambulation   Stairs            Wheelchair Mobility    Modified Rankin (Stroke Patients Only)       Balance Overall balance assessment: Needs assistance Sitting-balance support: No upper extremity supported;Feet supported Sitting balance-Leahy Scale: Good     Standing balance support: Single extremity supported Standing balance-Leahy Scale: Fair                      Cognition Arousal/Alertness: Awake/alert Behavior During Therapy: WFL for tasks assessed/performed Overall Cognitive Status: Within Functional Limits for tasks assessed                      Exercises Total Joint Exercises Quad Sets: AROM;Both;10 reps;Supine Gluteal Sets: AROM;Both;10 reps;Supine Heel Slides: AROM;10 reps;Supine;Left Hip ABduction/ADduction: AROM;Left;10 reps;Supine    General Comments        Pertinent Vitals/Pain Pain Assessment: 0-10 Pain Score: 5  Pain Location: L LE Pain Descriptors / Indicators: Sore Pain Intervention(s): Limited activity within patient's tolerance;Monitored during session;Premedicated before session;Repositioned;Ice applied    Home Living                      Prior Function            PT Goals (current goals can now be found in the care plan section) Acute Rehab PT Goals Patient Stated Goal: get back to work in 4 weeks Progress towards PT goals: Progressing toward goals    Frequency  7X/week    PT Plan Current plan remains appropriate    Co-evaluation  End of Session Equipment Utilized During Treatment: Gait belt Activity Tolerance: Patient tolerated treatment well Patient left: in bed;with call bell/phone within reach     Time: 1105-1144 PT Time Calculation (min) (ACUTE ONLY): 39 min  Charges:  $Gait Training: 8-22 mins $Therapeutic Exercise: 8-22 mins $Therapeutic Activity: 8-22 mins                    G Codes:      [place] April, PTA Pager: [phone]   08/26/2015, 3:13 PM

## 2015-08-26 NOTE — Progress Notes (Signed)
Occupational Therapy Treatment Patient Details Name: [name] [name] MRN: [id number] DOB: [birth year] Today's Date: 08/26/2015    History of present illness The patient is a 60 y.o. female with a long history of a painful hip. This has persisted despite multiple conservative measures. The patient has persisted with pain and dysfunction making rest and activity difficult. A total hip replacement is offered as surgical treatment.   OT comments  Education provided and OT signing off.   Follow Up Recommendations  No OT follow up;Supervision - Intermittent    Equipment Recommendations  None recommended by OT    Recommendations for Other Services      Precautions / Restrictions Precautions Precautions: Fall Precaution Comments: direct anterior approach, no precautions Restrictions Weight Bearing Restrictions: Yes LLE Weight Bearing: Weight bearing as tolerated       Mobility Bed Mobility Overal bed mobility: Needs Assistance Bed Mobility: Supine to Sit;Sit to Supine     Supine to sit: Supervision Sit to supine: Min assist   General bed mobility comments: assist to position in bed. Educated on technique for pt assist LLE (hooked RLE behind LLE).  Transfers Overall transfer level: Needs assistance Equipment used: Rolling walker (2 wheeled) Transfers: Sit to/from Stand Sit to Stand: Supervision (and set up for RW)               Balance    No LOB in session. Used RW for ambulation.                               ADL Overall ADL's : Needs assistance/impaired                     Lower Body Dressing: Sit to/from stand;With adaptive equipment;Set up;Supervision/safety   Toilet Transfer: Ambulation;RW;Supervision/safety;Set up (sit to stand from bed and chair)           Functional mobility during ADLs: Rolling walker;Supervision/safety;Set up (set up for RW) General ADL Comments: Educated/reviewed on safety information. Educated on LB dressing  technique. Pt practiced with sock aid. Discussed AE. Pt did not want to practice shower transfer and reports home health therapist went over this with her last time.       Vision                     Perception     Praxis      Cognition  Awake/Alert Behavior During Therapy: WFL for tasks assessed/performed Overall Cognitive Status: Within Functional Limits for tasks assessed                       Extremity/Trunk Assessment               Exercises     Shoulder Instructions       General Comments      Pertinent Vitals/ Pain       Pain Assessment: 0-10 Pain Score: 7  Pain Location: LLE Pain Descriptors / Indicators: Stabbing Pain Intervention(s): Monitored during session;Repositioned  Home Living                                          Prior Functioning/Environment              Frequency Min 2X/week     Progress Toward Goals  OT Goals(current  goals can now be found in the care plan section)  Progress towards OT goals: Progressing toward goals-adequate for d/c  Acute Rehab OT Goals Patient Stated Goal: get back to work in 4 weeks OT Goal Formulation: With patient Time For Goal Achievement: 09/01/15 Potential to Achieve Goals: Good  Plan Discharge plan remains appropriate    Co-evaluation                 End of Session Equipment Utilized During Treatment: Rolling walker;Other (comment) (AE)   Activity Tolerance Patient tolerated treatment well   Patient Left in bed;with call bell/phone within reach   Nurse Communication          Time: [id number] OT Time Calculation (min): 16 min  Charges: OT General Charges $OT Visit: 1 Procedure OT Treatments $Self Care/Home Management : 8-22 mins  Benito Mccreedy OTR/L [id number] 08/26/2015, 10:18 AM

## 2015-08-27 MED ORDER — METHOCARBAMOL 500 MG PO TABS: 500.0000 mg | ORAL_TABLET | Freq: Four times a day (QID) | ORAL | Status: DC | PRN

## 2015-08-27 MED ORDER — ASPIRIN 325 MG PO TBEC: 325.0000 mg | DELAYED_RELEASE_TABLET | Freq: Two times a day (BID) | ORAL | Status: DC

## 2015-08-27 MED ORDER — HYDROCODONE-ACETAMINOPHEN 5-325 MG PO TABS: 1.0000 | ORAL_TABLET | ORAL | Status: DC | PRN

## 2015-08-27 NOTE — Progress Notes (Signed)
Pt discharge education and instructions completed with pt; pt voices understanding and denied any questions. Pt IV removed; pt handed her prescriptions for aspirin, Vicodin and robaxin. Pt hip incision dsg remain dry and intact with no active bleeding or drainage noted. Pt discharge home with spouse to transport her home. Pt transported off unit via wheelchair with belongings to the side. Delia Heady RN

## 2015-08-27 NOTE — Progress Notes (Signed)
Physical Therapy Treatment Patient Details Name: [name] [name] MRN: [id number] DOB: Oct 16, 1955 Today's Date: 08/27/2015    History of Present Illness The patient is a 60 y.o. female with a long history of a painful hip. This has persisted despite multiple conservative measures. The patient has persisted with pain and dysfunction making rest and activity difficult. A total hip replacement is offered as surgical treatment.    PT Comments    Continue to progress as tolerated with anticipated d/c home with HHPT.   Follow Up Recommendations  Home health PT;Supervision - Intermittent     Equipment Recommendations  None recommended by PT    Recommendations for Other Services       Precautions / Restrictions Precautions Precautions: Fall Precaution Comments: direct anterior approach, no precautions Restrictions Weight Bearing Restrictions: Yes LLE Weight Bearing: Weight bearing as tolerated    Mobility  Bed Mobility Overal bed mobility: Needs Assistance Bed Mobility: Supine to Sit;Sit to Supine     Supine to sit: Modified independent (Device/Increase time) Sit to supine: Supervision   General bed mobility comments: min use of bed rails for scooting up in bed; supervision for safety with sit to supine; increased time needed with pt using R LE to assist with elevating L LE  Transfers Overall transfer level: Needs assistance Equipment used: Rolling walker (2 wheeled) Transfers: Sit to/from Stand Sit to Stand: Supervision         General transfer comment: good safety awareness; safe hand placement and technique   Ambulation/Gait Ambulation/Gait assistance: Supervision Ambulation Distance (Feet): 250 Feet Assistive device: Rolling walker (2 wheeled) Gait Pattern/deviations: Step-through pattern;Decreased stride length   Gait velocity interpretation: Below normal speed for age/gender General Gait Details: pt with less guarded movements; vc for position of RW and  visual cues for bilat heel strike   Stairs Stairs: Yes Stairs assistance: Supervision Stair Management: One rail Left;Sideways;Two rails;Forwards Number of Stairs: 4 (2X2) General stair comments: supervision for safety; carry over of sequencing and technique  Wheelchair Mobility    Modified Rankin (Stroke Patients Only)       Balance Overall balance assessment: Needs assistance Sitting-balance support: No upper extremity supported;Feet supported Sitting balance-Leahy Scale: Good     Standing balance support: Single extremity supported;During functional activity Standing balance-Leahy Scale: Fair                      Cognition Arousal/Alertness: Awake/alert Behavior During Therapy: WFL for tasks assessed/performed Overall Cognitive Status: Within Functional Limits for tasks assessed                      Exercises Total Joint Exercises Hip ABduction/ADduction: AROM;Both;10 reps;Standing Long Arc Quad: AROM;10 reps;Left;Seated Knee Flexion: AROM;Both;10 reps;Standing Marching in Standing: AROM;Both;10 reps;Standing    General Comments        Pertinent Vitals/Pain Pain Assessment: Faces Pain Score: 7  Faces Pain Scale: Hurts little more Pain Location: anterior superior to L knee Pain Descriptors / Indicators: Sharp Pain Intervention(s): Limited activity within patient's tolerance;Monitored during session;Repositioned;Ice applied    Home Living                      Prior Function            PT Goals (current goals can now be found in the care plan section) Acute Rehab PT Goals Patient Stated Goal: get back to work in 4 weeks Progress towards PT goals: Progressing toward goals  Frequency  7X/week    PT Plan Current plan remains appropriate    Co-evaluation             End of Session Equipment Utilized During Treatment: Gait belt Activity Tolerance: Patient tolerated treatment well Patient left: in bed;with call  bell/phone within reach     Time: 1342-1359 PT Time Calculation (min) (ACUTE ONLY): 17 min  Charges:  $Gait Training: 8-22 mins $Therapeutic Exercise: 8-22 mins                    G Codes:      Salina April, PTA Pager: [phone]   08/27/2015, 2:08 PM

## 2015-08-27 NOTE — Progress Notes (Signed)
Physical Therapy Treatment Patient Details Name: [name] [name] MRN: [id number] DOB: 1956-04-08 Today's Date: 08/27/2015    History of Present Illness The patient is a 60 y.o. female with a long history of a painful hip. This has persisted despite multiple conservative measures. The patient has persisted with pain and dysfunction making rest and activity difficult. A total hip replacement is offered as surgical treatment.    PT Comments    Patient is making good progress with PT.  From a mobility standpoint anticipate patient will be ready for DC home when medically ready.     Follow Up Recommendations  Home health PT;Supervision - Intermittent     Equipment Recommendations  None recommended by PT    Recommendations for Other Services       Precautions / Restrictions Precautions Precautions: Fall Precaution Comments: direct anterior approach, no precautions Restrictions Weight Bearing Restrictions: Yes LLE Weight Bearing: Weight bearing as tolerated    Mobility  Bed Mobility Overal bed mobility: Needs Assistance Bed Mobility: Supine to Sit;Sit to Supine     Supine to sit: Modified independent (Device/Increase time) Sit to supine: Supervision   General bed mobility comments: min use of bedrail and HOB flat supin to sit with increased time needed (pt has hospital bed at home); supervision for sit to supine for safety with carry over of techniqe  Transfers Overall transfer level: Needs assistance Equipment used: Rolling walker (2 wheeled) Transfers: Sit to/from Stand Sit to Stand: Supervision         General transfer comment: carry over of safe hand placement; no unsteadiness upon standing  Ambulation/Gait Ambulation/Gait assistance: Supervision Ambulation Distance (Feet): 200 Feet Assistive device: Rolling walker (2 wheeled) Gait Pattern/deviations: Step-through pattern;Decreased stride length   Gait velocity interpretation: Below normal speed for  age/gender General Gait Details: improved gait mechanics after initial ~25 ft with less reliance on RW; increased cadence and symmetrical bilat step lengths   Stairs Stairs: Yes Stairs assistance: Supervision Stair Management: One rail Left;Sideways;Two rails;Forwards Number of Stairs: 4 (2X2) General stair comments: supervision for safety; carry over of sequencing and technique  Wheelchair Mobility    Modified Rankin (Stroke Patients Only)       Balance Overall balance assessment: Needs assistance Sitting-balance support: No upper extremity supported;Feet supported Sitting balance-Leahy Scale: Good     Standing balance support: Single extremity supported;During functional activity Standing balance-Leahy Scale: Fair                      Cognition Arousal/Alertness: Awake/alert Behavior During Therapy: WFL for tasks assessed/performed Overall Cognitive Status: Within Functional Limits for tasks assessed                      Exercises Total Joint Exercises Hip ABduction/ADduction: AROM;Both;10 reps;Standing Long Arc Quad: AROM;10 reps;Left;Seated Knee Flexion: AROM;Both;10 reps;Standing Marching in Standing: AROM;Both;10 reps;Standing    General Comments        Pertinent Vitals/Pain Pain Assessment: 0-10 Pain Score: 7  Pain Location: L anterior thigh Pain Descriptors / Indicators: Burning;Sharp Pain Intervention(s): Limited activity within patient's tolerance;Monitored during session;Premedicated before session;Repositioned;Ice applied    Home Living                      Prior Function            PT Goals (current goals can now be found in the care plan section) Acute Rehab PT Goals Patient Stated Goal: get back to work in  4 weeks Progress towards PT goals: Progressing toward goals    Frequency  7X/week    PT Plan Current plan remains appropriate    Co-evaluation             End of Session Equipment Utilized During  Treatment: Gait belt Activity Tolerance: Patient tolerated treatment well Patient left: in bed;with call bell/phone within reach     Time: 0935-1007 PT Time Calculation (min) (ACUTE ONLY): 32 min  Charges:  $Gait Training: 8-22 mins $Therapeutic Exercise: 8-22 mins                    G Codes:      Salina April, PTA Pager: [phone]   08/27/2015, 10:19 AM

## 2015-08-27 NOTE — Discharge Summary (Signed)
Patient ID: [name] [name] MRN: [id number] DOB/AGE: [birth year] 60 y.o.  Admit date: 08/25/2015 Discharge date: 08/27/2015  Admission Diagnoses:  Principal Problem:   Primary osteoarthritis of left hip   Discharge Diagnoses:  Same  Past Medical History  Diagnosis Date  . Neuromuscular disorder ([place])   . Depression   . High cholesterol     takes Pravastatin daily  . Rectosigmoid cancer ([place]) 2004     S/P Surgery, Chemo, Rad Tx,  (after chemo treatment menstral periods have ceased).  . Arthritis     "right hip; left hip; most of my joints" (05/27/2014)  . Essential hypertension, benign     takes Lisinopril-HCTZ daily  . PONV (postoperative nausea and vomiting)     hallucinations  . Neuropathy ([place])     since chemo and radiation  . Joint pain   . History of colon polyps     benign  . Diverticulosis   . Nocturia   . History of blood transfusion     no abnormal reaction noted 2015  . Anemia   . Anxiety     no meds  . Insomnia     doesn't take any meds  . Aortic regurgitation     Surgeries: Procedure(s): TOTAL HIP ARTHROPLASTY ANTERIOR APPROACH on 08/25/2015   Consultants:    Discharged Condition: Improved  Hospital Course: KATHRYNNE EKBLAD is an 60 y.o. female who was admitted 08/25/2015 for operative treatment ofPrimary osteoarthritis of left hip. Patient has severe unremitting pain that affects sleep, daily activities, and work/hobbies. After pre-op clearance the patient was taken to the operating room on 08/25/2015 and underwent  Procedure(s): TOTAL HIP ARTHROPLASTY ANTERIOR APPROACH.    Patient was given perioperative antibiotics: Anti-infectives    Start     Dose/Rate Route Frequency Ordered Stop   08/25/15 1330  ceFAZolin (ANCEF) IVPB 2 g/50 mL premix     2 g 100 mL/hr over 30 Minutes Intravenous Every 6 hours 08/25/15 1222 08/25/15 1914   08/25/15 0700  ceFAZolin (ANCEF) IVPB 2 g/50 mL premix     2 g 100 mL/hr over 30 Minutes Intravenous To ShortStay  Surgical 08/24/15 1116 08/25/15 0745       Patient was given sequential compression devices, early ambulation, and chemoprophylaxis to prevent DVT.  Patient benefited maximally from hospital stay and there were no complications.    Recent vital signs: Patient Vitals for the past 24 hrs:  BP Temp Temp src Pulse Resp SpO2  08/27/15 0500 126/62 mmHg 98.7 F (37.1 C) - 91 17 100 %  08/26/15 2018 (!) 134/53 mmHg 98.6 F (37 C) Oral 83 17 100 %  08/26/15 1459 (!) 118/52 mmHg 98.3 F (36.8 C) Oral 89 16 100 %     Recent laboratory studies: No results for input(s): WBC, HGB, HCT, PLT, NA, K, CL, CO2, BUN, CREATININE, GLUCOSE, INR, CALCIUM in the last 72 hours.  Invalid input(s): PT, 2   Discharge Medications:     Medication List    STOP taking these medications        meloxicam 15 MG tablet  Commonly known as:  MOBIC      TAKE these medications        aspirin 325 MG EC tablet  Take 1 tablet (325 mg total) by mouth 2 (two) times daily after a meal.     DRY EYES OP  Apply 1 drop to eye every morning.     HYDROcodone-acetaminophen 5-325 MG tablet  Commonly known as:  NORCO/VICODIN  Take 1-2 tablets by mouth every 4 (four) hours as needed (breakthrough pain).     lisinopril-hydrochlorothiazide 20-12.5 MG tablet  Commonly known as:  PRINZIDE,ZESTORETIC  Take 2 tablets by mouth daily.     methocarbamol 500 MG tablet  Commonly known as:  ROBAXIN  Take 1 tablet (500 mg total) by mouth every 6 (six) hours as needed for muscle spasms.     pravastatin 80 MG tablet  Commonly known as:  PRAVACHOL  Take 80 mg by mouth daily.     Vitamin D (Ergocalciferol) 50000 units Caps capsule  Commonly known as:  DRISDOL  TAKE 1 CAPSULE BY MOUTH ONCE A WEEK.        Diagnostic Studies: Dg Hip Operative Unilat W Or W/o Pelvis Left  08/25/2015  CLINICAL DATA:  Left anterior hip arthroplasty EXAM: OPERATIVE left HIP (WITH PELVIS IF PERFORMED) 2 VIEWS TECHNIQUE: Fluoroscopic spot image(s)  were submitted for interpretation post-operatively. COMPARISON:  None. FINDINGS: Two spot films of the left hip show the acetabular and femoral components to be in good position. No complicating features are seen. IMPRESSION: Left total hip replacement components in good position on spot films obtained. Electronically Signed   By: Ivar Drape M.D.   On: 08/25/2015 09:37    Disposition: 01-Home or Self Care      Discharge Instructions    Call MD / Call 911    Complete by:  As directed   If you experience chest pain or shortness of breath, CALL 911 and be transported to the hospital emergency room.  If you develope a fever above 101 F, pus (white drainage) or increased drainage or redness at the wound, or calf pain, call your surgeon's office.     Constipation Prevention    Complete by:  As directed   Drink plenty of fluids.  Prune juice may be helpful.  You may use a stool softener, such as Colace (over the counter) 100 mg twice a day.  Use MiraLax (over the counter) for constipation as needed.     Diet - low sodium heart healthy    Complete by:  As directed      Discharge instructions    Complete by:  As directed   INSTRUCTIONS AFTER JOINT REPLACEMENT   Remove items at home which could result in a fall. This includes throw rugs or furniture in walking pathways ICE to the affected joint every three hours while awake for 30 minutes at a time, for at least the first 3-5 days, and then as needed for pain and swelling.  Continue to use ice for pain and swelling. You may notice swelling that will progress down to the foot and ankle.  This is normal after surgery.  Elevate your leg when you are not up walking on it.   Continue to use the breathing machine you got in the hospital (incentive spirometer) which will help keep your temperature down.  It is common for your temperature to cycle up and down following surgery, especially at night when you are not up moving around and exerting yourself.  The  breathing machine keeps your lungs expanded and your temperature down.   DIET:  As you were doing prior to hospitalization, we recommend a well-balanced diet.  DRESSING / WOUND CARE / SHOWERING  You may shower 3 days after surgery, but keep the wounds dry during showering.  You may use an occlusive plastic wrap (Press'n Seal for example), NO SOAKING/SUBMERGING IN THE BATHTUB.  If the bandage  gets wet, change with a clean dry gauze.  If the incision gets wet, pat the wound dry with a clean towel.  ACTIVITY  Increase activity slowly as tolerated, but follow the weight bearing instructions below.   No driving for 6 weeks or until further direction given by your physician.  You cannot drive while taking narcotics.  No lifting or carrying greater than 10 lbs. until further directed by your surgeon. Avoid periods of inactivity such as sitting longer than an hour when not asleep. This helps prevent blood clots.  You may return to work once you are authorized by your doctor.     WEIGHT BEARING   Weight bearing as tolerated with assist device (walker, cane, etc) as directed, use it as long as suggested by your surgeon or therapist, typically at least 4-6 weeks.   EXERCISES  Results after joint replacement surgery are often greatly improved when you follow the exercise, range of motion and muscle strengthening exercises prescribed by your doctor. Safety measures are also important to protect the joint from further injury. Any time any of these exercises cause you to have increased pain or swelling, decrease what you are doing until you are comfortable again and then slowly increase them. If you have problems or questions, call your caregiver or physical therapist for advice.   Rehabilitation is important following a joint replacement. After just a few days of immobilization, the muscles of the leg can become weakened and shrink (atrophy).  These exercises are designed to build up the tone and  strength of the thigh and leg muscles and to improve motion. Often times heat used for twenty to thirty minutes before working out will loosen up your tissues and help with improving the range of motion but do not use heat for the first two weeks following surgery (sometimes heat can increase post-operative swelling).   These exercises can be done on a training (exercise) mat, on the floor, on a table or on a bed. Use whatever works the best and is most comfortable for you.    Use music or television while you are exercising so that the exercises are a pleasant break in your day. This will make your life better with the exercises acting as a break in your routine that you can look forward to.   Perform all exercises about fifteen times, three times per day or as directed.  You should exercise both the operative leg and the other leg as well.   Exercises include:   Quad Sets - Tighten up the muscle on the front of the thigh (Quad) and hold for 5-10 seconds.   Straight Leg Raises - With your knee straight (if you were given a brace, keep it on), lift the leg to 60 degrees, hold for 3 seconds, and slowly lower the leg.  Perform this exercise against resistance later as your leg gets stronger.  Leg Slides: Lying on your back, slowly slide your foot toward your buttocks, bending your knee up off the floor (only go as far as is comfortable). Then slowly slide your foot back down until your leg is flat on the floor again.  Angel Wings: Lying on your back spread your legs to the side as far apart as you can without causing discomfort.  Hamstring Strength:  Lying on your back, push your heel against the floor with your leg straight by tightening up the muscles of your buttocks.  Repeat, but this time bend your knee to a comfortable angle,  and push your heel against the floor.  You may put a pillow under the heel to make it more comfortable if necessary.   A rehabilitation program following joint replacement  surgery can speed recovery and prevent re-injury in the future due to weakened muscles. Contact your doctor or a physical therapist for more information on knee rehabilitation.    CONSTIPATION  Constipation is defined medically as fewer than three stools per week and severe constipation as less than one stool per week.  Even if you have a regular bowel pattern at home, your normal regimen is likely to be disrupted due to multiple reasons following surgery.  Combination of anesthesia, postoperative narcotics, change in appetite and fluid intake all can affect your bowels.   YOU MUST use at least one of the following options; they are listed in order of increasing strength to get the job done.  They are all available over the counter, and you may need to use some, POSSIBLY even all of these options:    Drink plenty of fluids (prune juice may be helpful) and high fiber foods Colace 100 mg by mouth twice a day  Senokot for constipation as directed and as needed Dulcolax (bisacodyl), take with full glass of water  Miralax (polyethylene glycol) once or twice a day as needed.  If you have tried all these things and are unable to have a bowel movement in the first 3-4 days after surgery call either your surgeon or your primary doctor.    If you experience loose stools or diarrhea, hold the medications until you stool forms back up.  If your symptoms do not get better within 1 week or if they get worse, check with your doctor.  If you experience "the worst abdominal pain ever" or develop nausea or vomiting, please contact the office immediately for further recommendations for treatment.   ITCHING:  If you experience itching with your medications, try taking only a single pain pill, or even half a pain pill at a time.  You can also use Benadryl over the counter for itching or also to help with sleep.   TED HOSE STOCKINGS:  Use stockings on both legs until for at least 2 weeks or as directed by physician  office. They may be removed at night for sleeping.  MEDICATIONS:  See your medication summary on the "After Visit Summary" that nursing will review with you.  You may have some home medications which will be placed on hold until you complete the course of blood thinner medication.  It is important for you to complete the blood thinner medication as prescribed.  PRECAUTIONS:  If you experience chest pain or shortness of breath - call 911 immediately for transfer to the hospital emergency department.   If you develop a fever greater that 101 F, purulent drainage from wound, increased redness or drainage from wound, foul odor from the wound/dressing, or calf pain - CONTACT YOUR SURGEON.                                                   FOLLOW-UP APPOINTMENTS:  If you do not already have a post-op appointment, please call the office for an appointment to be seen by your surgeon.  Guidelines for how soon to be seen are listed in your "After Visit Summary", but are typically between 1-4  weeks after surgery.  OTHER INSTRUCTIONS:   Knee Replacement:  Do not place pillow under knee, focus on keeping the knee straight while resting. CPM instructions: 0-90 degrees, 2 hours in the morning, 2 hours in the afternoon, and 2 hours in the evening. Place foam block, curve side up under heel at all times except when in CPM or when walking.  DO NOT modify, tear, cut, or change the foam block in any way.  MAKE SURE YOU:  Understand these instructions.  Get help right away if you are not doing well or get worse.    Thank you for letting us be a part of your medical care team.  It is a privilege we respect greatly.  We hope these instructions will help you stay on track for a fast and full recovery!     Increase activity slowly as tolerated    Complete by:  As directed            Follow-up Information    Follow up with Hessie Dibble, MD. Schedule an appointment as soon as possible for a visit in 2 weeks.    Specialty:  Orthopedic Surgery   Contact information:   [place] [address] [phone]       Follow up with [facility].   Why:  Someone from [facility] care will contact you concerning start date and time for therapy within 24-48hrs.   Contact information:   [address] [place] [address] [phone]        Signed: Rich Fuchs 08/27/2015, 2:24 PM

## 2015-09-28 ENCOUNTER — Other Ambulatory Visit: Payer: Self-pay

## 2015-09-28 MED ORDER — VITAMIN D (ERGOCALCIFEROL) 1.25 MG (50000 UNIT) PO CAPS: ORAL_CAPSULE | ORAL | Status: DC

## 2015-09-29 ENCOUNTER — Other Ambulatory Visit: Payer: Self-pay

## 2015-09-29 MED ORDER — VITAMIN D (ERGOCALCIFEROL) 1.25 MG (50000 UNIT) PO CAPS
ORAL_CAPSULE | ORAL | Status: DC
Start: 2015-09-29 — End: 2017-06-06

## 2015-10-06 ENCOUNTER — Encounter: Payer: Self-pay | Admitting: Physician Assistant

## 2015-10-06 DIAGNOSIS — Z96642 Presence of left artificial hip joint: Secondary | ICD-10-CM | POA: Insufficient documentation

## 2015-12-02 ENCOUNTER — Telehealth: Payer: Self-pay

## 2015-12-02 NOTE — Telephone Encounter (Signed)
The patient is requesting a refill of meloxicam (MOBIC) 15 MG tablet.  She said she is out and her joints have been bothering her a lot lately.  She is scheduled for an appointment with Windell Hummingbird on 12/17/15, if it is not able to be filled prior to an OV.  [facility] uses the [facility] on [address].  CB#: [phone]

## 2015-12-04 MED ORDER — MELOXICAM 15 MG PO TABS: 15.0000 mg | ORAL_TABLET | Freq: Every day | ORAL | Status: DC

## 2015-12-04 NOTE — Telephone Encounter (Signed)
Meds ordered this encounter  Medications  . meloxicam (MOBIC) 15 MG tablet    Sig: Take 1 tablet (15 mg total) by mouth daily.    Dispense:  30 tablet    Refill:  0    Order Specific Question:  Supervising Provider    Answer:  DOOLITTLE, ROBERT P [id number]

## 2015-12-04 NOTE — Telephone Encounter (Signed)
Can we send in Rx?

## 2015-12-04 NOTE — Telephone Encounter (Signed)
LM informing patient that rx has been sent to pharmacy

## 2015-12-08 ENCOUNTER — Encounter: Payer: Self-pay | Admitting: Physician Assistant

## 2015-12-08 DIAGNOSIS — Z96641 Presence of right artificial hip joint: Secondary | ICD-10-CM | POA: Insufficient documentation

## 2015-12-17 ENCOUNTER — Ambulatory Visit (INDEPENDENT_AMBULATORY_CARE_PROVIDER_SITE_OTHER): Payer: BLUE CROSS/BLUE SHIELD | Admitting: Physician Assistant

## 2015-12-17 ENCOUNTER — Encounter: Payer: Self-pay | Admitting: Physician Assistant

## 2015-12-17 VITALS — BP 130/64 | HR 69 | Temp 98.4°F | Resp 16 | Ht 62.0 in | Wt 150.8 lb

## 2015-12-17 DIAGNOSIS — M1990 Unspecified osteoarthritis, unspecified site: Secondary | ICD-10-CM

## 2015-12-17 DIAGNOSIS — G4700 Insomnia, unspecified: Secondary | ICD-10-CM | POA: Diagnosis not present

## 2015-12-17 MED ORDER — TRAZODONE HCL 50 MG PO TABS: 25.0000 mg | ORAL_TABLET | Freq: Every evening | ORAL | Status: DC | PRN

## 2015-12-17 MED ORDER — MELOXICAM 15 MG PO TABS: 15.0000 mg | ORAL_TABLET | Freq: Every day | ORAL | Status: DC

## 2015-12-17 NOTE — Progress Notes (Signed)
[name] [name]  MRN: [id number] DOB: 13-Jun-1956  Subjective:  Pt presents to clinic for 2 concerns -  1- needs a refill of her mobic - it helps with all her pain as well as her swelling because when she does not hurt she moves more 2- she is having trouble sleeping - she has situational stressors with her son and his addiction problems - she has been on Ambien in the past which she did not like because she would eat while sleeping and not know it - she has tried Xanax and that did help - she can fall asleep ok but she cannot stay asleep - she only sleeps about 1-1.5h before waking up - early in the evening she can get back to sleep quickly but as it gets closer to her needing to get up for work she has a harder time getting back to sleep and she starts to worry about how tired she is going to be the next day.  The worse her stress is the worse her sleep is and currently she is really tired because she has not been sleeping for a while.  Patient Active Problem List   Diagnosis Date Noted  . History of total right hip replacement 12/08/2015  . History of left hip replacement 10/06/2015  . Primary osteoarthritis of left hip 08/25/2015  . Postoperative anemia due to acute blood loss 05/29/2014  . Primary osteoarthritis of right hip 05/27/2014  . ANXIETY DISORDER- mixed disorder w/ depressive symptoms 02/07/2014  . Knee pain   . PERIPHERAL NEUROPATHY 10/13/2007  . HYPERTENSION 10/13/2007  . HERNIA, UMBILICAL [id number]  . COLONIC POLYPS, ADENOMATOUS 02/20/2003  . DIVERTICULOSIS, COLON 02/20/2003  . ADENOCARCINOMA, COLON, SIGMOID, HX OF 02/20/2003    Current Outpatient Prescriptions on File Prior to Visit  Medication Sig Dispense Refill  . Artificial Tear Ointment (DRY EYES OP) Apply 1 drop to eye every morning.    [name] lisinopril-hydrochlorothiazide (PRINZIDE,ZESTORETIC) 20-12.5 MG per tablet Take 2 tablets by mouth daily.    . pravastatin (PRAVACHOL) 80 MG tablet Take 80 mg by mouth daily.     . Vitamin D, Ergocalciferol, (DRISDOL) 50000 units CAPS capsule TAKE 1 CAPSULE BY MOUTH ONCE A WEEK. 12 capsule 0  . aspirin EC 325 MG EC tablet Take 1 tablet (325 mg total) by mouth 2 (two) times daily after a meal. (Patient not taking: Reported on 12/17/2015) 60 tablet 0  . HYDROcodone-acetaminophen (NORCO/VICODIN) 5-325 MG tablet Take 1-2 tablets by mouth every 4 (four) hours as needed (breakthrough pain). (Patient not taking: Reported on 12/17/2015) 50 tablet 0  . methocarbamol (ROBAXIN) 500 MG tablet Take 1 tablet (500 mg total) by mouth every 6 (six) hours as needed for muscle spasms. (Patient not taking: Reported on 12/17/2015) 50 tablet 0   No current facility-administered medications on file prior to visit.    No Known Allergies  Review of Systems  Psychiatric/Behavioral: Positive for sleep disturbance.   Objective:  BP 130/64 mmHg  Pulse 69  Temp(Src) 98.4 F (36.9 C) (Oral)  Resp 16  Ht 5\' 2"  (1.575 m)  Wt 150 lb 12.8 oz (68.402 kg)  BMI 27.57 kg/m2  SpO2 99%  Physical Exam  Constitutional: She is oriented to person, place, and time and well-developed, well-nourished, and in no distress.  HENT:  Head: Normocephalic and atraumatic.  Right Ear: Hearing and external ear normal.  Left Ear: Hearing and external ear normal.  Eyes: Conjunctivae are normal.  Neck: Normal range of motion.  Pulmonary/Chest: Effort normal.  Neurological: She is alert and oriented to person, place, and time. Gait normal.  Skin: Skin is warm and dry.  Psychiatric: Mood, memory, affect and judgment normal.  Vitals reviewed.   Assessment and Plan :  Insomnia - Plan: traZODone (DESYREL) 50 MG tablet - d/w pt long term insomnia and a better treatment than Xanax - we will try trazodone and she will titrate to response or side effects - if this does not work we will try restoril as that should disrupt the sleep cycle less than Xanax  Arthritis - Plan: meloxicam (MOBIC) 15 MG tablet - refilled her  medications -   Windell Hummingbird PA-C  [facility] 12/17/2015 5:31 PM

## 2015-12-17 NOTE — Patient Instructions (Addendum)
  To start the Trazodone - start with 1/2 pill at night for the 1st 4 nights - increase the dose by 25mg (1/2 pill) every 4-5 nights until either you sleep well, have side effects or reach a nightly dose of 150mg    IF you received an x-ray today, you will receive an invoice from [facility]. Please contact [facility] at [phone] with questions or concerns regarding your invoice.   IF you received labwork today, you will receive an invoice from [facility]. Please contact [facility] at [phone] with questions or concerns regarding your invoice.   Our billing staff will not be able to assist you with questions regarding bills from these companies.  You will be contacted with the lab results as soon as they are available. The fastest way to get your results is to activate your My Chart account. Instructions are located on the last page of this paperwork. If you have not heard from us regarding the results in 2 weeks, please contact this office.     

## 2016-05-05 ENCOUNTER — Encounter: Payer: Self-pay | Admitting: Physician Assistant

## 2016-07-25 DIAGNOSIS — Z23 Encounter for immunization: Secondary | ICD-10-CM | POA: Diagnosis not present

## 2016-11-14 ENCOUNTER — Encounter: Payer: Self-pay | Admitting: Gastroenterology

## 2016-12-06 ENCOUNTER — Encounter: Payer: Self-pay | Admitting: Gastroenterology

## 2016-12-25 ENCOUNTER — Other Ambulatory Visit: Payer: Self-pay | Admitting: Physician Assistant

## 2016-12-25 DIAGNOSIS — M1990 Unspecified osteoarthritis, unspecified site: Secondary | ICD-10-CM

## 2017-01-04 ENCOUNTER — Encounter: Payer: Self-pay | Admitting: Gastroenterology

## 2017-01-04 ENCOUNTER — Ambulatory Visit (AMBULATORY_SURGERY_CENTER): Payer: Self-pay

## 2017-01-04 VITALS — Ht 61.5 in | Wt 158.2 lb

## 2017-01-04 DIAGNOSIS — Z85038 Personal history of other malignant neoplasm of large intestine: Secondary | ICD-10-CM

## 2017-01-04 MED ORDER — SUPREP BOWEL PREP KIT 17.5-3.13-1.6 GM/177ML PO SOLN: 1.0000 | Freq: Once | ORAL | 0 refills | Status: AC

## 2017-01-04 NOTE — Progress Notes (Signed)
No allergies to eggs or soy No home oxygen No diet meds No past problems with anesthesia  Declined emmi  

## 2017-01-18 ENCOUNTER — Encounter: Payer: Self-pay | Admitting: Gastroenterology

## 2017-01-18 ENCOUNTER — Ambulatory Visit (AMBULATORY_SURGERY_CENTER): Payer: 59 | Admitting: Gastroenterology

## 2017-01-18 VITALS — BP 135/67 | HR 61 | Temp 97.7°F | Resp 18 | Ht 61.0 in | Wt 158.0 lb

## 2017-01-18 DIAGNOSIS — D122 Benign neoplasm of ascending colon: Secondary | ICD-10-CM | POA: Diagnosis not present

## 2017-01-18 DIAGNOSIS — Z85038 Personal history of other malignant neoplasm of large intestine: Secondary | ICD-10-CM | POA: Diagnosis not present

## 2017-01-18 DIAGNOSIS — D123 Benign neoplasm of transverse colon: Secondary | ICD-10-CM

## 2017-01-18 DIAGNOSIS — D125 Benign neoplasm of sigmoid colon: Secondary | ICD-10-CM | POA: Diagnosis not present

## 2017-01-18 DIAGNOSIS — D120 Benign neoplasm of cecum: Secondary | ICD-10-CM | POA: Diagnosis not present

## 2017-01-18 DIAGNOSIS — C19 Malignant neoplasm of rectosigmoid junction: Secondary | ICD-10-CM | POA: Diagnosis not present

## 2017-01-18 MED ORDER — SODIUM CHLORIDE 0.9 % IV SOLN: 500.0000 mL | INTRAVENOUS | Status: DC

## 2017-01-18 NOTE — Progress Notes (Signed)
Called to room to assist during endoscopic procedure.  Patient ID and intended procedure confirmed with present staff. Received instructions for my participation in the procedure from the performing physician.  

## 2017-01-18 NOTE — Patient Instructions (Signed)
YOU HAD AN ENDOSCOPIC PROCEDURE TODAY AT THE [facility]:   Refer to the procedure report that was given to you for any specific questions about what was found during the examination.  If the procedure report does not answer your questions, please call your gastroenterologist to clarify.  If you requested that your care partner not be given the details of your procedure findings, then the procedure report has been included in a sealed envelope for you to review at your convenience later.  YOU SHOULD EXPECT: Some feelings of bloating in the abdomen. Passage of more gas than usual.  Walking can help get rid of the air that was put into your GI tract during the procedure and reduce the bloating. If you had a lower endoscopy (such as a colonoscopy or flexible sigmoidoscopy) you may notice spotting of blood in your stool or on the toilet paper. If you underwent a bowel prep for your procedure, you may not have a normal bowel movement for a few days.  Please Note:  You might notice some irritation and congestion in your nose or some drainage.  This is from the oxygen used during your procedure.  There is no need for concern and it should clear up in a day or so.  SYMPTOMS TO REPORT IMMEDIATELY:   Following lower endoscopy (colonoscopy or flexible sigmoidoscopy):  Excessive amounts of blood in the stool  Significant tenderness or worsening of abdominal pains  Swelling of the abdomen that is new, acute  Fever of 100F or higher    For urgent or emergent issues, a gastroenterologist can be reached at any hour by calling [phone].   DIET:  We do recommend a small meal at first, but then you may proceed to your regular diet.  Drink plenty of fluids but you should avoid alcoholic beverages for 24 hours.  ACTIVITY:  You should plan to take it easy for the rest of today and you should NOT DRIVE or use heavy machinery until tomorrow (because of the sedation medicines used during the test).     FOLLOW UP: Our staff will call the number listed on your records the next business day following your procedure to check on you and address any questions or concerns that you may have regarding the information given to you following your procedure. If we do not reach you, we will leave a message.  However, if you are feeling well and you are not experiencing any problems, there is no need to return our call.  We will assume that you have returned to your regular daily activities without incident.  If any biopsies were taken you will be contacted by phone or by letter within the next 1-3 weeks.  Please call us at [phone] if you have not heard about the biopsies in 3 weeks.    SIGNATURES/CONFIDENTIALITY: You and/or your care partner have signed paperwork which will be entered into your electronic medical record.  These signatures attest to the fact that that the information above on your After Visit Summary has been reviewed and is understood.  Full responsibility of the confidentiality of this discharge information lies with you and/or your care-partner.  Polyp and diverticulosis information given. 

## 2017-01-18 NOTE — Progress Notes (Signed)
Alert and oriented x3, pleased with MAC, report to RN Opal Sidles

## 2017-01-18 NOTE — Op Note (Signed)
[place] Patient Name: [name] [name] Procedure Date: 01/18/2017 1:37 PM MRN: [id number] Endoscopist: Mauri Pole , MD Age: 61 Referring MD:  Date of Birth: [birth year] Gender: Female Account #: [id number] Procedure:                Colonoscopy Indications:              High risk colon cancer surveillance: Personal                            history of colon cancer, High risk colon cancer                            surveillance: Personal history of adenoma (10 mm or                            greater in size), Last colonoscopy: 2016 Medicines:                Monitored Anesthesia Care Procedure:                Pre-Anesthesia Assessment:                           - Prior to the procedure, a History and Physical                            was performed, and patient medications and                            allergies were reviewed. The patient's tolerance of                            previous anesthesia was also reviewed. The risks                            and benefits of the procedure and the sedation                            options and risks were discussed with the patient.                            All questions were answered, and informed consent                            was obtained. Prior Anticoagulants: The patient has                            taken no previous anticoagulant or antiplatelet                            agents. ASA Grade Assessment: II - A patient with                            mild systemic disease. After reviewing the risks  and benefits, the patient was deemed in                            satisfactory condition to undergo the procedure.                           After obtaining informed consent, the colonoscope                            was passed under direct vision. Throughout the                            procedure, the patient's blood pressure, pulse, and                            oxygen saturations  were monitored continuously. The                            Model CF-HQ190L [id number]) scope was introduced                            through the anus and advanced to the the terminal                            ileum, with identification of the appendiceal                            orifice and IC valve. The colonoscopy was performed                            without difficulty. The patient tolerated the                            procedure well. The quality of the bowel                            preparation was excellent. The terminal ileum,                            ileocecal valve, appendiceal orifice, and rectum                            were photographed. Scope In: 1:38:43 PM Scope Out: 2:01:34 PM Scope Withdrawal Time: 0 hours 20 minutes 52 seconds  Total Procedure Duration: 0 hours 22 minutes 51 seconds  Findings:                 The perianal and digital rectal examinations were                            normal.                           A 25 mm polyp was found in the cecum. The polyp was  granular lateral spreading. The polyp was removed                            with a piecemeal technique using a cold snare.                            Resection and retrieval were complete.                           Three sessile polyps were found in the sigmoid                            colon, ascending colon and cecum. The polyps were 7                            to 15 mm in size. These polyps were removed with a                            cold snare. Resection and retrieval were complete.                           A 3 mm polyp was found in the transverse colon. The                            polyp was sessile. The polyp was removed with a                            cold biopsy forceps. Resection and retrieval were                            complete.                           A few small-mouthed diverticula were found in the                             sigmoid colon and descending colon.                           There was evidence of a prior functional end-to-end                            colo-colonic anastomosis in the recto-sigmoid                            colon. This was patent and was characterized by                            congestion, erythema and friable mucosa. The                            anastomosis was traversed. Complications:            No immediate complications. Estimated Blood Loss:  Estimated blood loss was minimal. Impression:               - One 25 mm polyp in the cecum, removed piecemeal                            using a cold snare. Resected and retrieved.                           - Three 7 to 15 mm polyps in the sigmoid colon, in                            the ascending colon and in the cecum, removed with                            a cold snare. Resected and retrieved.                           - One 3 mm polyp in the transverse colon, removed                            with a cold biopsy forceps. Resected and retrieved.                           - Diverticulosis in the sigmoid colon and in the                            descending colon.                           - Patent functional end-to-end colo-colonic                            anastomosis, characterized by congestion, erythema                            and friable mucosa. Recommendation:           - Patient has a contact number available for                            emergencies. The signs and symptoms of potential                            delayed complications were discussed with the                            patient. Return to normal activities tomorrow.                            Written discharge instructions were provided to the                            patient.                           -  Resume previous diet.                           - Continue present medications.                           - Await pathology results.                            - Repeat colonoscopy in 1 year for surveillance                            after piecemeal polypectomy.                           - Return to GI clinic PRN. Mauri Pole, MD 01/18/2017 2:12:44 PM This report has been signed electronically.

## 2017-01-19 ENCOUNTER — Telehealth: Payer: Self-pay | Admitting: *Deleted

## 2017-01-19 NOTE — Telephone Encounter (Signed)
No answer left message and will attempt to call back this afternoon for follow up. SM

## 2017-01-19 NOTE — Telephone Encounter (Signed)
No answer for follow up call left message for patient to call with questions or concerns.

## 2017-01-27 ENCOUNTER — Encounter: Payer: Self-pay | Admitting: Gastroenterology

## 2017-02-09 DIAGNOSIS — I10 Essential (primary) hypertension: Secondary | ICD-10-CM | POA: Diagnosis not present

## 2017-02-09 DIAGNOSIS — I359 Nonrheumatic aortic valve disorder, unspecified: Secondary | ICD-10-CM | POA: Diagnosis not present

## 2017-02-09 DIAGNOSIS — E785 Hyperlipidemia, unspecified: Secondary | ICD-10-CM | POA: Diagnosis not present

## 2017-03-27 ENCOUNTER — Ambulatory Visit (INDEPENDENT_AMBULATORY_CARE_PROVIDER_SITE_OTHER): Payer: 59 | Admitting: Gastroenterology

## 2017-03-27 ENCOUNTER — Encounter: Payer: Self-pay | Admitting: Gastroenterology

## 2017-03-27 ENCOUNTER — Encounter (INDEPENDENT_AMBULATORY_CARE_PROVIDER_SITE_OTHER): Payer: Self-pay

## 2017-03-27 VITALS — BP 142/60 | HR 84 | Ht 63.0 in | Wt 155.8 lb

## 2017-03-27 DIAGNOSIS — Z85038 Personal history of other malignant neoplasm of large intestine: Secondary | ICD-10-CM | POA: Diagnosis not present

## 2017-03-27 DIAGNOSIS — K5902 Outlet dysfunction constipation: Secondary | ICD-10-CM | POA: Diagnosis not present

## 2017-03-27 MED ORDER — LUBIPROSTONE 8 MCG PO CAPS: 8.0000 ug | ORAL_CAPSULE | Freq: Two times a day (BID) | ORAL | 3 refills | Status: DC

## 2017-03-27 NOTE — Progress Notes (Signed)
[name] [name]    [id number]    09-26-1955  Primary Care Physician:Weber, Damaris Hippo, PA-C  Referring Physician: Mancel Bale, PA-C [place], [place] [address]  Chief complaint:  Constipation, fragmented stool and fecal urgency  HPI: 61 year old female with personal history of colon cancer, adenocarcinoma of rectosigmoid T3 N0 diagnosed in 2004 status post low anterior resection and adjuvant chemoradiation. She underwent surveillance colonoscopy 01/18/2017 with removal of multiple colon polyps largest was 25 mm removed piecemeal, were all tubular adenomas with no dysplasia. Prior to that colonoscopy July 2016 with removal of tubular adenoma. Patient is here with complaints of worsening constipation in past few years with fragmentation of stools and incomplete evacuation followed by fecal urgency. She has to go back to bathroom after she passes a small amount of stool and has to do this about 5- 6 times in the morning before she feels she has completely evacuated. Denies any mucus or blood in stool. No recent change in weight or diet. Denies any nausea, vomiting, abdominal pain or melena.     Outpatient Encounter Prescriptions as of 03/27/2017  Medication Sig  . Artificial Tear Ointment (DRY EYES OP) Apply 1 drop to eye every morning.  [name] lisinopril-hydrochlorothiazide (PRINZIDE,ZESTORETIC) 20-12.5 MG per tablet Take 2 tablets by mouth daily.  . meloxicam (MOBIC) 15 MG tablet TAKE 1 TABLET EVERY DAY  . pravastatin (PRAVACHOL) 80 MG tablet Take 80 mg by mouth daily.  . Vitamin D, Ergocalciferol, (DRISDOL) 50000 units CAPS capsule TAKE 1 CAPSULE BY MOUTH ONCE A WEEK.  . [DISCONTINUED] Bisacodyl (LAXATIVE PO) Take by mouth.  . [DISCONTINUED] HYDROcodone-acetaminophen (NORCO/VICODIN) 5-325 MG tablet Take 1-2 tablets by mouth every 4 (four) hours as needed (breakthrough pain). (Patient not taking: Reported on 12/17/2015)  . [DISCONTINUED] loperamide (IMODIUM A-D) 2 MG tablet  Take 2 mg by mouth 4 (four) times daily as needed for diarrhea or loose stools.  . [DISCONTINUED] methocarbamol (ROBAXIN) 500 MG tablet Take 1 tablet (500 mg total) by mouth every 6 (six) hours as needed for muscle spasms. (Patient not taking: Reported on 12/17/2015)  . [DISCONTINUED] pravastatin (PRAVACHOL) 80 MG tablet Take 80 mg by mouth daily.  . [DISCONTINUED] traZODone (DESYREL) 50 MG tablet Take 0.5-2 tablets (25-100 mg total) by mouth at bedtime as needed for sleep. (Patient not taking: Reported on 01/18/2017)   Facility-Administered Encounter Medications as of 03/27/2017  Medication  . 0.9 %  sodium chloride infusion    Allergies as of 03/27/2017  . (No Known Allergies)    Past Medical History:  Diagnosis Date  . Anemia   . Anxiety    no meds  . Aortic regurgitation   . Arthritis    "right hip; left hip; most of my joints" (05/27/2014)  . Depression   . Diverticulosis   . Essential hypertension, benign    takes Lisinopril-HCTZ daily  . High cholesterol    takes Pravastatin daily  . History of blood transfusion    no abnormal reaction noted 2015  . History of colon polyps    benign  . Insomnia    doesn't take any meds  . Joint pain   . Neuromuscular disorder ([facility])   . Neuropathy    since chemo and radiation  . Nocturia   . PONV (postoperative nausea and vomiting)    hallucinations  . Rectosigmoid cancer ([facility]) 2004    S/P Surgery, Chemo, Rad Tx,  (after chemo treatment menstral periods  have ceased).    Past Surgical History:  Procedure Laterality Date  . COLECTOMY  Aug 2004   S/P Low Anterior Resxn (Sigmoid Colon)  . COLONOSCOPY    . DILATION AND CURETTAGE OF UTERUS  1991  . LUMBAR DISC SURGERY  Feb 2007  . TOTAL HIP ARTHROPLASTY Right 05/27/2014   Procedure: RIGHT TOTAL HIP ARTHROPLASTY ANTERIOR APPROACH;  Surgeon: Hessie Dibble, MD;  Location: [facility];  Service: Orthopedics;  Laterality: Right;  . TOTAL HIP ARTHROPLASTY Left 08/25/2015   Procedure: TOTAL  HIP ARTHROPLASTY ANTERIOR APPROACH;  Surgeon: Melrose Nakayama, MD;  Location: [facility];  Service: Orthopedics;  Laterality: Left;    Family History  Problem Relation Age of Onset  . Adopted: Yes  . Colon cancer Neg Hx     Social History   Social History  . Marital status: Married    Spouse name: N/A  . Number of children: N/A  . Years of education: N/A   Occupational History  . office work    Social History Main Topics  . Smoking status: Former Smoker    Types: Cigarettes  . Smokeless tobacco: Never Used     Comment: "smoked some socially in my college days"  . Alcohol use Yes     Comment: occasionally beer/wine  . Drug use: No  . Sexual activity: Yes    Birth control/ protection: None     Comment: number of sex partners in the last 24 months  1   Other Topics Concern  . Not on file   Social History Narrative   Exercise walking 2 x weekly   Patient's family history -  UNKNOWN ADOPTED      Review of systems: Review of Systems  Constitutional: Negative for fever and chills.  HENT: Negative.   Eyes: Negative for blurred vision.  Respiratory: Negative for cough, shortness of breath and wheezing.   Cardiovascular: Negative for chest pain and palpitations.  Gastrointestinal: as per HPI Genitourinary: Negative for dysuria, urgency, frequency and hematuria.  Musculoskeletal: Negative for myalgias, back pain and joint pain.  Skin: Negative for itching and rash.  Neurological: Negative for dizziness, tremors, focal weakness, seizures and loss of consciousness.  Endo/Heme/Allergies: Positive for seasonal allergies.  Psychiatric/Behavioral: Negative for depression, suicidal ideas and hallucinations.  All other systems reviewed and are negative.   Physical Exam: Vitals:   03/27/17 1052  BP: (!) 142/60  Pulse: 84   Body mass index is 27.6 kg/m. Gen:      No acute distress HEENT:  EOMI, sclera anicteric Neck:     No masses; no thyromegaly Lungs:    Clear to  auscultation bilaterally; normal respiratory effort CV:         Regular rate and rhythm; no murmurs Abd:      + bowel sounds; soft, non-tender; no palpable masses, no distension Ext:    No edema; adequate peripheral perfusion Skin:      Warm and dry; no rash Neuro: alert and oriented x 3 Psych: normal mood and affect Rectal exam: Normal anal sphincter tone, no anal fissure or external hemorrhoids, normal pelvic descent with contraction of anal sphincter on bear down   Data Reviewed:  Reviewed labs, radiology imaging, old records and pertinent past GI work up   Assessment and Plan/Recommendations:  61 year old female with personal history of colon cancer, adenocarcinoma of rectosigmoid T3 N0 stage II status post resection and adjuvant chemoradiation, status post removal of multiple tubular adenomas July 2018 here for follow-up visit with  complaints of worsening constipation Advised patient to increase dietary fiber and fluid intake Soluble fiber 1 tablespoon 3 times daily Amitiza 8 g twice daily If no improvement with laxatives will consider anorectal manometry to further evaluate for possible dyssynergia defecation  25 minutes was spent face-to-face with the patient. Greater than 50% of the time used for counseling as well as treatment plan and follow-up. She had multiple questions which were answered to her satisfaction  K. Denzil Magnuson , MD [phone] Mon-Fri 8a-5p [phone] after 5p, weekends, holidays  CC: Weber, Damaris Hippo, PA-C

## 2017-03-27 NOTE — Patient Instructions (Signed)
We will send Amitiza to your pharmacy  Take Benefiber 1 tablespoon three times a day with meals

## 2017-04-03 ENCOUNTER — Telehealth: Payer: Self-pay | Admitting: Gastroenterology

## 2017-04-03 NOTE — Telephone Encounter (Signed)
She may get increased bloating with Lactulose. Will they cover Linzess? If not Miralax 1 capful daily at bedtime

## 2017-04-03 NOTE — Telephone Encounter (Signed)
Dr Nandigam Please advise  

## 2017-04-05 MED ORDER — LINACLOTIDE 72 MCG PO CAPS: 72.0000 ug | ORAL_CAPSULE | Freq: Every day | ORAL | 3 refills | Status: DC

## 2017-04-05 NOTE — Telephone Encounter (Signed)
Patient wanted me to send in Linzess 72 mcg to her pharmacy  She will try Miralax daily at bedtime  In the meantime

## 2017-04-07 ENCOUNTER — Telehealth: Payer: Self-pay | Admitting: *Deleted

## 2017-04-07 NOTE — Telephone Encounter (Signed)
Prior auth sent for Linzess to insurance today    Linzess approved

## 2017-05-08 LAB — HM MAMMOGRAPHY

## 2017-06-06 ENCOUNTER — Encounter (INDEPENDENT_AMBULATORY_CARE_PROVIDER_SITE_OTHER): Payer: Self-pay

## 2017-06-06 ENCOUNTER — Ambulatory Visit: Payer: BLUE CROSS/BLUE SHIELD | Admitting: Gastroenterology

## 2017-06-06 ENCOUNTER — Encounter: Payer: Self-pay | Admitting: Gastroenterology

## 2017-06-06 VITALS — BP 140/68 | HR 80 | Ht 61.5 in | Wt 160.6 lb

## 2017-06-06 DIAGNOSIS — K5902 Outlet dysfunction constipation: Secondary | ICD-10-CM | POA: Diagnosis not present

## 2017-06-06 NOTE — Progress Notes (Signed)
[name] [name]    [id number]    01-18-1956  Primary Care Physician:Weber, Damaris Hippo, PA-C  Referring Physician: Mancel Bale, PA-C [address] [name], [place] [address]  Chief complaint:  Constipation  HPI: [name] [name] is here for follow-up of constipation.  Amitiza was not covered by insurance.  She was sent with prescription for Linzess 72 mcg daily instead, she took it for 2 days on the weekend, had abdominal cramps and diarrhea, she was worried about taking it during the weekdays.  Denies any nausea or vomiting.  She continues to have difficulty evacuating her bowels, currently taking over-the-counter stool softeners.   Previous HPI  03/27/2017 60 year old female with personal history of colon cancer, adenocarcinoma of rectosigmoid T3 N0 diagnosed in 2004 status post low anterior resection and adjuvant chemoradiation. She underwent surveillance colonoscopy 01/18/2017 with removal of multiple colon polyps largest was 25 mm removed piecemeal, were all tubular adenomas with no dysplasia. Prior to that colonoscopy July 2016 with removal of tubular adenoma. Patient is here with complaints of worsening constipation in past few years with fragmentation of stools and incomplete evacuation followed by fecal urgency. She has to go back to bathroom after she passes a small amount of stool and has to do this about 5- 6 times in the morning before she feels she has completely evacuated. Denies any mucus or blood in stool. No recent change in weight or diet. Denies any nausea, vomiting, abdominal pain or melena.    Outpatient Encounter Medications as of 06/06/2017  Medication Sig  . Artificial Tear Ointment (DRY EYES OP) Apply 1 drop to eye every morning.  . linaclotide (LINZESS) 72 MCG capsule Take 1 capsule (72 mcg total) by mouth daily before breakfast.  . lisinopril-hydrochlorothiazide (PRINZIDE,ZESTORETIC) 20-12.5 MG per tablet Take 2 tablets by mouth daily.  . meloxicam (MOBIC) 15  MG tablet TAKE 1 TABLET EVERY DAY  . pravastatin (PRAVACHOL) 80 MG tablet Take 80 mg by mouth daily.  . [DISCONTINUED] lubiprostone (AMITIZA) 8 MCG capsule Take 1 capsule (8 mcg total) by mouth 2 (two) times daily with a meal.  . [DISCONTINUED] Vitamin D, Ergocalciferol, (DRISDOL) 50000 units CAPS capsule TAKE 1 CAPSULE BY MOUTH ONCE A WEEK.   Facility-Administered Encounter Medications as of 06/06/2017  Medication  . 0.9 %  sodium chloride infusion    Allergies as of 06/06/2017  . (No Known Allergies)    Past Medical History:  Diagnosis Date  . Anemia   . Anxiety    no meds  . Aortic regurgitation   . Arthritis    "right hip; left hip; most of my joints" (05/27/2014)  . Depression   . Diverticulosis   . Essential hypertension, benign    takes Lisinopril-HCTZ daily  . High cholesterol    takes Pravastatin daily  . History of blood transfusion    no abnormal reaction noted 2015  . History of colon polyps    benign  . Insomnia    doesn't take any meds  . Joint pain   . Neuromuscular disorder ([facility])   . Neuropathy    since chemo and radiation  . Nocturia   . PONV (postoperative nausea and vomiting)    hallucinations  . Rectosigmoid cancer ([facility]) 2004    S/P Surgery, Chemo, Rad Tx,  (after chemo treatment menstral periods have ceased).    Past Surgical History:  Procedure Laterality Date  . COLECTOMY  Aug 2004   S/P Low  Anterior Resxn (Sigmoid Colon)  . COLONOSCOPY    . DILATION AND CURETTAGE OF UTERUS  1991  . LUMBAR DISC SURGERY  Feb 2007  . TOTAL HIP ARTHROPLASTY Right 05/27/2014   Procedure: RIGHT TOTAL HIP ARTHROPLASTY ANTERIOR APPROACH;  Surgeon: Hessie Dibble, MD;  Location: [facility];  Service: Orthopedics;  Laterality: Right;  . TOTAL HIP ARTHROPLASTY Left 08/25/2015   Procedure: TOTAL HIP ARTHROPLASTY ANTERIOR APPROACH;  Surgeon: Melrose Nakayama, MD;  Location: [facility];  Service: Orthopedics;  Laterality: Left;    Family History  Adopted: Yes  Problem  Relation Age of Onset  . Colon cancer Neg Hx     Social History   Socioeconomic History  . Marital status: Married    Spouse name: Not on file  . Number of children: Not on file  . Years of education: Not on file  . Highest education level: Not on file  Social Needs  . Financial resource strain: Not on file  . Food insecurity - worry: Not on file  . Food insecurity - inability: Not on file  . Transportation needs - medical: Not on file  . Transportation needs - non-medical: Not on file  Occupational History  . Occupation: office work  Tobacco Use  . Smoking status: Former Smoker    Types: Cigarettes  . Smokeless tobacco: Never Used  . Tobacco comment: "smoked some socially in my college days"  Substance and Sexual Activity  . Alcohol use: Yes    Comment: occasionally beer/wine  . Drug use: No  . Sexual activity: Yes    Birth control/protection: None    Comment: number of sex partners in the last 45 months  1  Other Topics Concern  . Not on file  Social History Narrative   Exercise walking 2 x weekly   Patient's family history -  UNKNOWN ADOPTED      Review of systems: Review of Systems  Constitutional: Negative for fever and chills.  HENT: Negative.   Eyes: Negative for blurred vision.  Respiratory: Negative for cough, shortness of breath and wheezing.   Cardiovascular: Negative for chest pain and palpitations.  Gastrointestinal: as per HPI Genitourinary: Negative for dysuria, urgency, frequency and hematuria.  Musculoskeletal: Positive for myalgias, back pain and joint pain.  Skin: Negative for itching and rash.  Neurological: Negative for dizziness, tremors, focal weakness, seizures and loss of consciousness.  Endo/Heme/Allergies: Positive for seasonal allergies.  Psychiatric/Behavioral: Negative for depression, suicidal ideas and hallucinations. Positive for anxiety All other systems reviewed and are negative.   Physical Exam: Vitals:   06/06/17 1341    BP: 140/68  Pulse: 80   Body mass index is 29.85 kg/m. Gen:      No acute distress HEENT:  EOMI, sclera anicteric Neck:     No masses; no thyromegaly Lungs:    Clear to auscultation bilaterally; normal respiratory effort CV:         Regular rate and rhythm; no murmurs Abd:      + bowel sounds; soft, non-tender; no palpable masses, no distension Ext:    No edema; adequate peripheral perfusion Skin:      Warm and dry; no rash Neuro: alert and oriented x 3 Psych: normal mood and affect  Data Reviewed:  Reviewed labs, radiology imaging, old records and pertinent past GI work up   Assessment and Plan/Recommendations:  61 year old female with personal history of colon cancer rectosigmoid colon stage III status post resection and adjuvant chemoradiation, removal of tubular adenoma on recent  colonoscopy July 2018, with worsening of chronic constipation Based on history patient may have outlet dysfunction, dyssynergy defecation We will schedule for anorectal manometry to evaluate, patient agrees to proceed with it She is willing to try Linzess 72 mcg again to see if she can tolerate it better Increase dietary fiber and fluid intake  25 minutes was spent face-to-face with the patient. Greater than 50% of the time used for counseling as well as treatment plan and follow-up. She had multiple questions which were answered to her satisfaction  K. Denzil Magnuson , MD [phone] Mon-Fri 8a-5p [phone] after 5p, weekends, holidays  CC: Weber, Damaris Hippo, PA-C

## 2017-06-06 NOTE — Patient Instructions (Addendum)
You have been scheduled to have an anorectal manometry at [facility] on 07/17/2017 at 8:30am. Please arrive 30 minutes prior to your appointment time for registration (1st floor of the hospital-admissions).  Please make certain to use 1 Fleets enema 2 hours prior to coming for your appointment. You can purchase Fleets enemas from the laxative section at your drug store. You should not eat anything during the two hours prior to the procedure. You may take regular medications with small sips of water at least 2 hours prior to the study.  Anorectal manometry is a test performed to evaluate patients with constipation or fecal incontinence. This test measures the pressures of the anal sphincter muscles, the sensation in the rectum, and the neural reflexes that are needed for normal bowel movements.  THE PROCEDURE The test takes approximately 30 minutes to 1 hour. You will be asked to change into a hospital gown. A technician or nurse will explain the procedure to you, take a brief health history, and answer any questions you may have. The patient then lies on his or her left side. A small, flexible tube, about the size of a thermometer, with a balloon at the end is inserted into the rectum. The catheter is connected to a machine that measures the pressure. During the test, the small balloon attached to the catheter may be inflated in the rectum to assess the normal reflex pathways. The nurse or technician may also ask the person to squeeze, relax, and push at various times. The anal sphincter muscle pressures are measured during each of these maneuvers. To squeeze, the patient tightens the sphincter muscles as if trying to prevent anything from coming out. To push or bear down, the patient strains down as if trying to have a bowel movement.   Continue Linzess   Constipation, Adult Constipation is when a person has fewer bowel movements in a week than normal, has difficulty having a bowel movement,  or has stools that are dry, hard, or larger than normal. Constipation may be caused by an underlying condition. It may become worse with age if a person takes certain medicines and does not take in enough fluids. Follow these instructions at home: Eating and drinking   Eat foods that have a lot of fiber, such as fresh fruits and vegetables, whole grains, and beans.  Limit foods that are high in fat, low in fiber, or overly processed, such as french fries, hamburgers, cookies, candies, and soda.  Drink enough fluid to keep your urine clear or pale yellow. General instructions  Exercise regularly or as told by your health care provider.  Go to the restroom when you have the urge to go. Do not hold it in.  Take over-the-counter and prescription medicines only as told by your health care provider. These include any fiber supplements.  Practice pelvic floor retraining exercises, such as deep breathing while relaxing the lower abdomen and pelvic floor relaxation during bowel movements.  Watch your condition for any changes.  Keep all follow-up visits as told by your health care provider. This is important. Contact a health care provider if:  You have pain that gets worse.  You have a fever.  You do not have a bowel movement after 4 days.  You vomit.  You are not hungry.  You lose weight.  You are bleeding from the anus.  You have thin, pencil-like stools. Get help right away if:  You have a fever and your symptoms suddenly get worse.  You leak stool or have blood in your stool.  Your abdomen is bloated.  You have severe pain in your abdomen.  You feel dizzy or you faint. This information is not intended to replace advice given to you by your health care provider. Make sure you discuss any questions you have with your health care provider. Document Released: 03/25/2004 Document Revised: 01/15/2016 Document Reviewed: 12/16/2015 Elsevier Interactive Patient Education   2017 [employer].

## 2017-07-14 NOTE — Progress Notes (Signed)
Left message with call back number to confirm appointment.

## 2017-07-17 ENCOUNTER — Encounter (HOSPITAL_COMMUNITY)
Admission: RE | Disposition: A | Discharge status: Home / Self Care | Payer: Self-pay | Source: Ambulatory Visit | Attending: Gastroenterology

## 2017-07-17 ENCOUNTER — Ambulatory Visit (HOSPITAL_COMMUNITY)
Admission: RE | Admit: 2017-07-17 | Discharge: 2017-07-17 | Disposition: A | Discharge status: Home / Self Care | Payer: BLUE CROSS/BLUE SHIELD | Source: Ambulatory Visit | Attending: Gastroenterology | Admitting: Gastroenterology

## 2017-07-17 DIAGNOSIS — K5902 Outlet dysfunction constipation: Secondary | ICD-10-CM | POA: Diagnosis not present

## 2017-07-17 DIAGNOSIS — K5900 Constipation, unspecified: Secondary | ICD-10-CM | POA: Diagnosis present

## 2017-07-17 HISTORY — PX: ANAL RECTAL MANOMETRY: SHX6358

## 2017-07-17 SURGERY — MANOMETRY, ANORECTAL

## 2017-07-17 NOTE — Progress Notes (Signed)
Anorectal manometry done per protocol.  Pt. Tolerated procedure well, without complications.  Pt. Able to expel the balloon in 10 seconds.  Dr. Silverio Decamp to be notified today of study.  Laverta Baltimore, RN

## 2017-07-18 ENCOUNTER — Encounter (HOSPITAL_COMMUNITY): Payer: Self-pay | Admitting: Gastroenterology

## 2017-08-02 ENCOUNTER — Other Ambulatory Visit: Payer: Self-pay

## 2017-08-02 DIAGNOSIS — K6289 Other specified diseases of anus and rectum: Principal | ICD-10-CM

## 2017-08-02 DIAGNOSIS — R159 Full incontinence of feces: Secondary | ICD-10-CM

## 2017-08-02 DIAGNOSIS — K629 Disease of anus and rectum, unspecified: Secondary | ICD-10-CM

## 2017-08-08 DIAGNOSIS — K5902 Outlet dysfunction constipation: Secondary | ICD-10-CM

## 2017-09-14 ENCOUNTER — Other Ambulatory Visit: Payer: Self-pay

## 2017-09-14 ENCOUNTER — Ambulatory Visit: Payer: BLUE CROSS/BLUE SHIELD | Attending: Gastroenterology | Admitting: Physical Therapy

## 2017-09-14 ENCOUNTER — Encounter: Payer: Self-pay | Admitting: Physical Therapy

## 2017-09-14 DIAGNOSIS — M25651 Stiffness of right hip, not elsewhere classified: Secondary | ICD-10-CM | POA: Insufficient documentation

## 2017-09-14 DIAGNOSIS — R279 Unspecified lack of coordination: Secondary | ICD-10-CM | POA: Insufficient documentation

## 2017-09-14 DIAGNOSIS — R252 Cramp and spasm: Secondary | ICD-10-CM | POA: Insufficient documentation

## 2017-09-14 DIAGNOSIS — M6281 Muscle weakness (generalized): Secondary | ICD-10-CM | POA: Insufficient documentation

## 2017-09-14 NOTE — Patient Instructions (Signed)
About Abdominal Massage  Abdominal massage, also called external colon massage, is a self-treatment circular massage technique that can reduce and eliminate gas and ease constipation. The colon naturally contracts in waves in a clockwise direction starting from inside the right hip, moving up toward the ribs, across the belly, and down inside the left hip.  When you perform circular abdominal massage, you help stimulate your colon's normal wave pattern of movement called peristalsis.  It is most beneficial when done after eating.  Positioning You can practice abdominal massage with oil while lying down, or in the shower with soap.  Some people find that it is just as effective to do the massage through clothing while sitting or standing.  How to Massage Start by placing your finger tips or knuckles on your right side, just inside your hip bone.  . Make small circular movements while you move upward toward your rib cage.   . Once you reach the bottom right side of your rib cage, take your circular movements across to the left side of the bottom of your rib cage.  . Next, move downward until you reach the inside of your left hip bone.  This is the path your feces travel in your colon. . Continue to perform your abdominal massage in this pattern for 10 minutes each day.     You can apply as much pressure as is comfortable in your massage.  Start gently and build pressure as you continue to practice.  Notice any areas of pain as you massage; areas of slight pain may be relieved as you massage, but if you have areas of significant or intense pain, consult with your healthcare provider.  Other Considerations . General physical activity including bending and stretching can have a beneficial massage-like effect on the colon.  Deep breathing can also stimulate the colon because breathing deeply activates the same nervous system that supplies the colon.   . Abdominal massage should always be used in  combination with a bowel-conscious diet that is high in the proper type of fiber for you, fluids (primarily water), and a regular exercise program. Types of Fiber  There are two main types of fiber:  insoluble and soluble.  Both of these types can prevent and relieve constipation and diarrhea, although some people find one or the other to be more easily digested.  This handout details information about both types of fiber.  Insoluble Fiber       Functions of Insoluble Fiber . moves bulk through the intestines  . controls and balances the pH (acidity) in the intestines       Benefits of Insoluble Fiber . promotes regular bowel movement and prevents constipation  . removes fecal waste through colon in less time  . keeps an optimal pH in intestines to prevent microbes from producing cancer substances, therefore preventing colon cancer        Food Sources of Insoluble Fiber . whole-wheat products  . wheat bran "miller's bran" . corn bran  . flax seed or other seeds . vegetables such as green beans, broccoli, cauliflower and potato skins  . fruit skins and root vegetable skins  . popcorn . brown rice  Soluble Fiber       Functions of Soluble Fiber  . holds water in the colon to bulk and soften the stool . prolongs stomach emptying time so that sugar is released and absorbed more slowly        Benefits of Soluble Fiber . lowers  total cholesterol and LDL cholesterol (the bad cholesterol) therefore reducing the risk of heart disease  . regulates blood sugar for people with diabetes       Food Sources of Soluble Fiber . oat/oat bran . dried beans and peas  . nuts  . barley  . flax seed or other seeds . fruits such as oranges, pears, peaches, and apples  . vegetables such as carrots  . psyllium husk  . prunes Toileting Techniques for Bowel Movements (Defecation) Using your belly (abdomen) and pelvic floor muscles to have a bowel movement is usually instinctive.  Sometimes people  can have problems with these muscles and have to relearn proper defecation (emptying) techniques.  If you have weakness in your muscles, organs that are falling out, decreased sensation in your pelvis, or ignore your urge to go, you may find yourself straining to have a bowel movement.  You are straining if you are: . holding your breath or taking in a huge gulp of air and holding it  . keeping your lips and jaw tensed and closed tightly . turning red in the face because of excessive pushing or forcing . developing or worsening your  hemorrhoids . getting faint while pushing . not emptying completely and have to defecate many times a day  If you are straining, you are actually making it harder for yourself to have a bowel movement.  Many people find they are pulling up with the pelvic floor muscles and closing off instead of opening the anus. Due to lack pelvic floor relaxation and coordination the abdominal muscles, one has to work harder to push the feces out.  Many people have never been taught how to defecate efficiently and effectively.  Notice what happens to your body when you are having a bowel movement.  While you are sitting on the toilet pay attention to the following areas: . Jaw and mouth position . Angle of your hips   . Whether your feet touch the ground or not . Arm placement  . Spine position . Waist . Belly tension . Anus (opening of the anal canal)  An Evacuation/Defecation Plan   Here are the 4 basic points:  1. Lean forward enough for your elbows to rest on your knees 2. Support your feet on the floor or use a low stool if your feet don't touch the floor  3. Push out your belly as if you have swallowed a beach ball-you should feel a widening of your waist 4. Open and relax your pelvic floor muscles, rather than tightening around the anus      The following conditions my require modifications to your toileting posture:  . If you have had surgery in the past that  limits your back, hip, pelvic, knee or ankle flexibility . Constipation   Your healthcare practitioner may make the following additional suggestions and adjustments:  1) Sit on the toilet  a) Make sure your feet are supported. b) Notice your hip angle and spine position-most people find it effective to lean forward or raise their knees, which can help the muscles around the anus to relax  c) When you lean forward, place your forearms on your thighs for support  2) Relax suggestions a) Breath deeply in through your nose and out slowly through your mouth as if you are smelling the flowers and blowing out the candles. b) To become aware of how to relax your muscles, contracting and releasing muscles can be helpful.  Pull your pelvic floor  muscles in tightly by using the image of holding back gas, or closing around the anus (visualize making a circle smaller) and lifting the anus up and in.  Then release the muscles and your anus should drop down and feel open. Repeat 5 times ending with the feeling of relaxation. c) Keep your pelvic floor muscles relaxed; let your belly bulge out. d) The digestive tract starts at the mouth and ends at the anal opening, so be sure to relax both ends of the tube.  Place your tongue on the roof of your mouth with your teeth separated.  This helps relax your mouth and will help to relax the anus at the same time.  3) Empty (defecation) a) Keep your pelvic floor and sphincter relaxed, then bulge your anal muscles.  Make the anal opening wide.  b) Stick your belly out as if you have swallowed a beach ball. c) Make your belly wall hard using your belly muscles while continuing to breathe. Doing this makes it easier to open your anus. d) Breath out and give a grunt (or try using other sounds such as ahhhh, shhhhh, ohhhh or grrrrrrr).  4) Finish a) As you finish your bowel movement, pull the pelvic floor muscles up and in.  This will leave your anus in the proper place  rather than remaining pushed out and down. If you leave your anus pushed out and down, it will start to feel as though that is normal and give you incorrect signals about needing to have a bowel movement.    [facility] [address] [place], [place] [address]

## 2017-09-14 NOTE — Therapy (Signed)
[facility] [address], [place], [place], [address] Phone: [phone]   Fax:  [phone]  Physical Therapy Evaluation  Patient Details  Name: [name] [name] MRN: [id number] Date of Birth: 1955-09-14 Referring Provider: Harl Bowie   Encounter Date: 09/14/2017  PT End of Session - 09/14/17 1232    Visit Number  1    Date for PT Re-Evaluation  11/09/17    PT Start Time  1145    PT Stop Time  1227    PT Time Calculation (min)  42 min    Activity Tolerance  Patient tolerated treatment well    Behavior During Therapy  [facility] for tasks assessed/performed       Past Medical History:  Diagnosis Date  . Anemia   . Anxiety    no meds  . Aortic regurgitation   . Arthritis    "right hip; left hip; most of my joints" (05/27/2014)  . Depression   . Diverticulosis   . Essential hypertension, benign    takes Lisinopril-HCTZ daily  . High cholesterol    takes Pravastatin daily  . History of blood transfusion    no abnormal reaction noted 2015  . History of colon polyps    benign  . Insomnia    doesn't take any meds  . Joint pain   . Neuromuscular disorder ([place])   . Neuropathy    since chemo and radiation  . Nocturia   . PONV (postoperative nausea and vomiting)    hallucinations  . Rectosigmoid cancer ([place]) 2004    S/P Surgery, Chemo, Rad Tx,  (after chemo treatment menstral periods have ceased).    Past Surgical History:  Procedure Laterality Date  . ANAL RECTAL MANOMETRY N/A 07/17/2017   Procedure: ANO RECTAL MANOMETRY;  Surgeon: Mauri Pole, MD;  Location: [facility];  Service: Endoscopy;  Laterality: N/A;  . COLECTOMY  Aug 2004   S/P Low Anterior Resxn (Sigmoid Colon)  . COLONOSCOPY    . DILATION AND CURETTAGE OF UTERUS  1991  . LUMBAR DISC SURGERY  Feb 2007  . TOTAL HIP ARTHROPLASTY Right 05/27/2014   Procedure: RIGHT TOTAL HIP ARTHROPLASTY ANTERIOR APPROACH;  Surgeon: Hessie Dibble,  MD;  Location: [facility];  Service: Orthopedics;  Laterality: Right;  . TOTAL HIP ARTHROPLASTY Left 08/25/2015   Procedure: TOTAL HIP ARTHROPLASTY ANTERIOR APPROACH;  Surgeon: Melrose Nakayama, MD;  Location: [place];  Service: Orthopedics;  Laterality: Left;    There were no vitals filed for this visit.   Subjective Assessment - 09/14/17 1148    Subjective  Pt states the left hip was replaced and ever since she has had difficulty having bowel movement.  Pt states she either can't go or can't hold it after taking laxitives.  She is having abdominal pain and nausea when the constipation gets really bad.      Pertinent History  THA february 2017, colon cancer, lumbar disc surgery    Patient Stated Goals  be able to have more regular bowel movement, at least 1x/day    Currently in Pain?  No/denies         [facility] PT Assessment - 09/14/17 0001      Assessment   Medical Diagnosis  R15.9,K62.89 (ICD-10-CM) - Fecal incontinence due to anorectal disorder    Referring Provider  Harl Bowie    Onset Date/Surgical Date  -- initial onset after colon CA; worse after Lt THA last year    Prior Therapy  No      Precautions   Precautions  None      Restrictions   Weight Bearing Restrictions  No      Balance Screen   Has the patient fallen in the past 6 months  No      [place] residence    Living Arrangements  Spouse/significant other      Prior Function   Level of Independence  Independent    Vocation  Full time employment    Vocation Requirements  desk work      Cognition   Overall Cognitive Status  Within Functional Limits for tasks assessed      Observation/Other Assessments   Focus on Therapeutic Outcomes (FOTO)   30% limited based on clinical assessment      Posture/Postural Control   Posture/Postural Control  Postural limitations    Postural Limitations  Rounded Shoulders;Increased thoracic kyphosis      ROM / Strength   AROM / PROM /  Strength  Strength;PROM      PROM   Overall PROM Comments  right hip IR and ER 50% limited      Strength   Strength Assessment Site  Hip    Right/Left Hip  Right;Left    Right Hip Flexion  5/5    Right Hip Extension  5/5    Right Hip External Rotation   5/5    Right Hip Internal Rotation  5/5    Right Hip ABduction  4+/5    Right Hip ADduction  4+/5    Left Hip Flexion  4-/5    Left Hip Extension  5/5    Left Hip External Rotation  5/5    Left Hip Internal Rotation  5/5    Left Hip ABduction  4-/5    Left Hip ADduction  4/5      Flexibility   Soft Tissue Assessment /Muscle Length  yes    Hamstrings  25% limited Rt>Lt      Palpation   Palpation comment  tight diaphragm, decreased ribcage collapse, tenderness around abdomen and hip flexors      Special Tests   Other special tests  easier to perform SLR with pelvic compression      Ambulation/Gait   Gait Pattern  Decreased stride length             Objective measurements completed on examination: See above findings.    Pelvic Floor Special Questions - 09/14/17 0001    Are you Pregnant or attempting pregnancy?  Yes    Prior Pregnancies  Yes    Number of Pregnancies  1    Number of Vaginal Deliveries  1    Currently Sexually Active  Yes    Is this Painful  Yes    Marinoff Scale  no problems    Urinary Leakage  Yes    How often  occasionally    Pad use  no    Activities that cause leaking  Sneezing;Coughing;Laughing    Urinary urgency  -- nocturia 3x    Urinary frequency  No    Fecal incontinence  Yes    Fluid intake  6-8 glasses    Falling out feeling (prolapse)  No    Skin Integrity  Intact    Pelvic Floor Internal Exam  pt informed and consent given to perform internal soft tissue assessment    Exam Type  Rectal    Strength  fair squeeze, definite lift  Strength # of reps  1    Strength # of seconds  10               PT Education - 09/14/17 1231    Education provided  Yes    Education  Details  toilet tech, ab massage, type of fiber    Person(s) Educated  Patient    Methods  Explanation;Demonstration;Tactile cues;Verbal cues;Handout    Comprehension  Verbalized understanding;Returned demonstration       PT Short Term Goals - 09/14/17 1438      PT SHORT TERM GOAL #1   Title  pt will be independent with toileting techniques and diaphragmatic breathing technique for improved intra-abdominal pressure during bowel movement    Time  4    Period  Weeks    Status  New    Target Date  10/12/17      PT SHORT TERM GOAL #2   Title  ind with initial HEP    Time  4    Period  Weeks    Status  New    Target Date  10/12/17      PT SHORT TERM GOAL #3   Title  able to correctly engage Transverse abdominus for improved functional activities and abdominal wall support    Time  4    Period  Weeks    Status  New    Target Date  10/12/17        PT Long Term Goals - 09/14/17 1440      PT LONG TERM GOAL #1   Title  pt will be ind with advanced HEP    Time  8    Period  Weeks    Status  New    Target Date  11/09/17      PT LONG TERM GOAL #2   Title  pt will have bowel movement at least 1x/day for reduced abdominal discomfort    Time  8    Period  Weeks    Status  New    Target Date  11/09/17      PT LONG TERM GOAL #3   Title  pt will be able to verbalize understanding of at least 2 techniques to relieve symptoms of constipation without use of laxitives    Time  8    Period  Weeks    Status  New    Target Date  11/09/17      PT LONG TERM GOAL #4   Title  Pt will be able to demonstrate left hip flexion and abduction of at least 4+/5 for improved stability without overuse of pelvic floor    Time  8    Period  Weeks    Status  New    Target Date  11/09/17      PT LONG TERM GOAL #5   Title  pt will report ability to perform Knack in order to control urinary leakage when coughing or sneezing    Time  8    Period  Weeks    Status  New    Target Date  11/09/17              Plan - 09/14/17 1214    Clinical Impression Statement  Pt presents to clinic with constipation and fecal incontinence. She had anal manometry showing reduced internal pressure during evacuation.  Pt was unable to evacuate finger from recutm.  Pt had pelvic floor strength 3/5 MMT and holding for 10 sec Pt has  difficulty relaxing after contraction.  Internal sphincter is weak against resistance.  She demonstrates decreased hip ROM and weakness bilaterally.  Pt has decreased mobility of ribcage with diaphragm muscle spasms.  She has tight and restricted hamstrings.  Pt demonstrates shortened step length.  Pt will benefit from skilled PT to work on these deficits and return to normal self care activities.    History and Personal Factors relevant to plan of care:  THA february 2017, colon cancer, lumbar disc surgery    Clinical Presentation  Evolving    Clinical Presentation due to:  chronic issue and symptoms have been worsening since Lt THA    Clinical Decision Making  Moderate    Rehab Potential  Excellent    Clinical Impairments Affecting Rehab Potential  chronic condition, moderate complexity    PT Frequency  1x / week    PT Duration  8 weeks    PT Treatment/Interventions  ADLs/Self Care Home Management;Biofeedback;Electrical Stimulation;Iontophoresis 4mg /ml Dexamethasone;Moist Heat;Functional mobility training;Therapeutic activities;Therapeutic exercise;Neuromuscular re-education;Manual techniques;Passive range of motion;Dry needling;Taping    PT Next Visit Plan  biofeedback, stretches, breathing, thoracic extension    Consulted and Agree with Plan of Care  Patient       Patient will benefit from skilled therapeutic intervention in order to improve the following deficits and impairments:  Pain, Increased fascial restricitons, Decreased strength, Increased muscle spasms, Impaired tone, Postural dysfunction, Decreased range of motion, Impaired flexibility  Visit  Diagnosis: Unspecified lack of coordination - Plan: PT plan of care cert/re-cert  Muscle weakness (generalized) - Plan: PT plan of care cert/re-cert  Stiffness of right hip, not elsewhere classified - Plan: PT plan of care cert/re-cert  Cramp and spasm - Plan: PT plan of care cert/re-cert     Problem List Patient Active Problem List   Diagnosis Date Noted  . Constipation due to outlet dysfunction   . History of total right hip replacement 12/08/2015  . History of left hip replacement 10/06/2015  . Primary osteoarthritis of left hip 08/25/2015  . Postoperative anemia due to acute blood loss 05/29/2014  . Primary osteoarthritis of right hip 05/27/2014  . ANXIETY DISORDER- mixed disorder w/ depressive symptoms 02/07/2014  . Knee pain   . PERIPHERAL NEUROPATHY 10/13/2007  . HYPERTENSION 10/13/2007  . HERNIA, UMBILICAL 20/94/7096  . COLONIC POLYPS, ADENOMATOUS 02/20/2003  . DIVERTICULOSIS, COLON 02/20/2003  . ADENOCARCINOMA, COLON, SIGMOID, HX OF 02/20/2003    Zannie Cove, PT 09/14/2017, 2:47 PM  [facility] [address], [place], [place], [address] Phone: [phone]   Fax:  [phone]  Name: [name] [name] MRN: [id number] Date of Birth: Jan 06, 1956

## 2017-09-19 ENCOUNTER — Encounter: Payer: Self-pay | Admitting: Physical Therapy

## 2017-09-19 ENCOUNTER — Ambulatory Visit: Payer: Self-pay | Admitting: Physical Therapy

## 2017-09-19 DIAGNOSIS — M6281 Muscle weakness (generalized): Secondary | ICD-10-CM

## 2017-09-19 DIAGNOSIS — R252 Cramp and spasm: Secondary | ICD-10-CM

## 2017-09-19 DIAGNOSIS — R279 Unspecified lack of coordination: Secondary | ICD-10-CM

## 2017-09-19 DIAGNOSIS — M25651 Stiffness of right hip, not elsewhere classified: Secondary | ICD-10-CM

## 2017-09-19 NOTE — Therapy (Signed)
[facility] [address] [place], [place], [address] Phone: [phone]   Fax:  [phone]  Physical Therapy Treatment  Patient Details  Name: [name] [name] MRN: [id number] Date of Birth: 1955-12-21 Referring Provider: Harl Bowie   Encounter Date: 09/19/2017  PT End of Session - 09/19/17 1156    Visit Number  2    Date for PT Re-Evaluation  11/09/17    PT Start Time  1941    PT Stop Time  1228 pr arrived late    PT Time Calculation (min)  34 min    Activity Tolerance  Patient tolerated treatment well    Behavior During Therapy  [facility] for tasks assessed/performed       Past Medical History:  Diagnosis Date  . Anemia   . Anxiety    no meds  . Aortic regurgitation   . Arthritis    "right hip; left hip; most of my joints" (05/27/2014)  . Depression   . Diverticulosis   . Essential hypertension, benign    takes Lisinopril-HCTZ daily  . High cholesterol    takes Pravastatin daily  . History of blood transfusion    no abnormal reaction noted 2015  . History of colon polyps    benign  . Insomnia    doesn't take any meds  . Joint pain   . Neuromuscular disorder ([place])   . Neuropathy    since chemo and radiation  . Nocturia   . PONV (postoperative nausea and vomiting)    hallucinations  . Rectosigmoid cancer ([place]) 2004    S/P Surgery, Chemo, Rad Tx,  (after chemo treatment menstral periods have ceased).    Past Surgical History:  Procedure Laterality Date  . ANAL RECTAL MANOMETRY N/A 07/17/2017   Procedure: ANO RECTAL MANOMETRY;  Surgeon: Mauri Pole, MD;  Location: [facility];  Service: Endoscopy;  Laterality: N/A;  . COLECTOMY  Aug 2004   S/P Low Anterior Resxn (Sigmoid Colon)  . COLONOSCOPY    . DILATION AND CURETTAGE OF UTERUS  1991  . LUMBAR DISC SURGERY  Feb 2007  . TOTAL HIP ARTHROPLASTY Right 05/27/2014   Procedure: RIGHT TOTAL HIP ARTHROPLASTY ANTERIOR APPROACH;  Surgeon:  Hessie Dibble, MD;  Location: [facility];  Service: Orthopedics;  Laterality: Right;  . TOTAL HIP ARTHROPLASTY Left 08/25/2015   Procedure: TOTAL HIP ARTHROPLASTY ANTERIOR APPROACH;  Surgeon: Melrose Nakayama, MD;  Location: [facility];  Service: Orthopedics;  Laterality: Left;    There were no vitals filed for this visit.  Subjective Assessment - 09/19/17 1157    Subjective  Pt states she felt like the massage helped.  She hasn't been using the laxatives this week.  Still not having complete bowel movements and hard to get to the point where she has to have a BM.     Pertinent History  THA february 2017, colon cancer, lumbar disc surgery    Patient Stated Goals  be able to have more regular bowel movement, at least 1x/day    Currently in Pain?  No/denies                      [facility] Adult PT Treatment/Exercise - 09/19/17 0001      Neuro Re-ed    Neuro Re-ed Details   diaphragmatic breathing with muscle coordination for bulge and relax puborectalis on exhale, breathing with stretches in HEP and below      Lumbar Exercises: Stretches   Double  Knee to Chest Stretch  3 reps;20 seconds    Piriformis Stretch  3 reps;Right;Left    Figure 4 Stretch  3 reps;20 seconds      Manual Therapy   Manual Therapy  Internal Pelvic Floor    Manual therapy comments  right coccygeus    Internal Pelvic Floor  pt informed and consent given to perform internal soft tissue mobs             PT Education - 09/19/17 1223    Education provided  Yes    Education Details  balloon breathing, pelvic and hip stretches    Person(s) Educated  Patient    Methods  Explanation;Demonstration;Handout;Verbal cues    Comprehension  Verbalized understanding;Returned demonstration       PT Short Term Goals - 09/14/17 1438      PT SHORT TERM GOAL #1   Title  pt will be independent with toileting techniques and diaphragmatic breathing technique for improved intra-abdominal pressure during bowel movement     Time  4    Period  Weeks    Status  New    Target Date  10/12/17      PT SHORT TERM GOAL #2   Title  ind with initial HEP    Time  4    Period  Weeks    Status  New    Target Date  10/12/17      PT SHORT TERM GOAL #3   Title  able to correctly engage Transverse abdominus for improved functional activities and abdominal wall support    Time  4    Period  Weeks    Status  New    Target Date  10/12/17        PT Long Term Goals - 09/14/17 1440      PT LONG TERM GOAL #1   Title  pt will be ind with advanced HEP    Time  8    Period  Weeks    Status  New    Target Date  11/09/17      PT LONG TERM GOAL #2   Title  pt will have bowel movement at least 1x/day for reduced abdominal discomfort    Time  8    Period  Weeks    Status  New    Target Date  11/09/17      PT LONG TERM GOAL #3   Title  pt will be able to verbalize understanding of at least 2 techniques to relieve symptoms of constipation without use of laxitives    Time  8    Period  Weeks    Status  New    Target Date  11/09/17      PT LONG TERM GOAL #4   Title  Pt will be able to demonstrate left hip flexion and abduction of at least 4+/5 for improved stability without overuse of pelvic floor    Time  8    Period  Weeks    Status  New    Target Date  11/09/17      PT LONG TERM GOAL #5   Title  pt will report ability to perform Knack in order to control urinary leakage when coughing or sneezing    Time  8    Period  Weeks    Status  New    Target Date  11/09/17            Plan - 09/19/17 1232  Clinical Impression Statement  Pt was able to demonstrate ability to relax puborectalis with inhale and coordinate to push down.  Pt had slightly more force when pushing down after several attempts with tactile feedback. No goals met today due to first treatment since eval.  Pt will benefit from skilled PT for improved self care activities.    PT Treatment/Interventions  ADLs/Self Care Home  Management;Biofeedback;Electrical Stimulation;Iontophoresis 62m/ml Dexamethasone;Moist Heat;Functional mobility training;Therapeutic activities;Therapeutic exercise;Neuromuscular re-education;Manual techniques;Passive range of motion;Dry needling;Taping    PT Next Visit Plan  thoracic extension, biofeedback, stretches, breathing,    Consulted and Agree with Plan of Care  Patient       Patient will benefit from skilled therapeutic intervention in order to improve the following deficits and impairments:  Pain, Increased fascial restricitons, Decreased strength, Increased muscle spasms, Impaired tone, Postural dysfunction, Decreased range of motion, Impaired flexibility  Visit Diagnosis: Unspecified lack of coordination  Muscle weakness (generalized)  Stiffness of right hip, not elsewhere classified  Cramp and spasm     Problem List Patient Active Problem List   Diagnosis Date Noted  . Constipation due to outlet dysfunction   . History of total right hip replacement 12/08/2015  . History of left hip replacement 10/06/2015  . Primary osteoarthritis of left hip 08/25/2015  . Postoperative anemia due to acute blood loss 05/29/2014  . Primary osteoarthritis of right hip 05/27/2014  . ANXIETY DISORDER- mixed disorder w/ depressive symptoms 02/07/2014  . Knee pain   . PERIPHERAL NEUROPATHY 10/13/2007  . HYPERTENSION 10/13/2007  . HERNIA, UMBILICAL 010/25/8527 . COLONIC POLYPS, ADENOMATOUS 02/20/2003  . DIVERTICULOSIS, COLON 02/20/2003  . ADENOCARCINOMA, COLON, SIGMOID, HX OF 02/20/2003    JZannie Cove PT 09/19/2017, 12:45 PM  [facility] [address] [place] [address]: [phone]  Fax:  [phone] Name: [name] PEREIRAMRN: [id number] of Birth: [birth year]

## 2017-09-19 NOTE — Patient Instructions (Signed)
Balloon Breath    Place hands LIGHTLY on belly below navel. Imagine a balloon inside belly. Blow up balloon on breath IN, deflate balloon on breath OUT. Contract abdominals slightly to assist breath OUT. Time _3__ minutes. 3x/day    Piriformis Stretch    Lying on back, pull right knee toward opposite shoulder. Hold _30___ seconds. Repeat __2__ times. Do _1___ sessions per day.  http://gt2.exer.us/258   Copyright  VHI. All rights reserved.   Piriformis Stretch, Supine    Lie supine, one ankle crossed onto opposite knee. Holding bottom leg behind knee, gently pull legs toward chest until stretch is felt in buttock of top leg. Hold _30__ seconds. For deeper stretch gently push top knee away from body.  Repeat _2__ times per session. Do _2__ sessions per day.  Copyright  VHI. All rights reserved.    Supine Knee-to-Chest, Unilateral    Lie on back, hands clasped behind one knee. Pull knee in toward chest until a comfortable stretch is felt in lower back and buttocks. Hold 30___ seconds.  Repeat __2_ times per session. Do _1__ sessions per day.  Copyright  VHI. All rights reserved.  Supine With Rotation    Lie on back with one knee drawn toward chest. Slowly bring bent leg across body until stretch is felt in lower back area. Hold _30__ seconds. Repeat to other side. Repeat _2__ times per session. Do __2_ sessions per day.  Copyright  VHI. All rights reserved.  Butterfly, Supine    Lie on back, feet together. Lower knees toward floor. Hold 60___ seconds. Repeat _2__ times per session. Do _1__ sessions per day.  Copyright  VHI. All rights reserved.    [facility] [address], [place], [place] [address] Phone # [phone] Fax [phone]

## 2017-09-28 ENCOUNTER — Encounter: Payer: Self-pay | Admitting: Physical Therapy

## 2017-09-28 ENCOUNTER — Ambulatory Visit: Payer: Self-pay | Admitting: Physical Therapy

## 2017-09-28 DIAGNOSIS — R252 Cramp and spasm: Secondary | ICD-10-CM

## 2017-09-28 DIAGNOSIS — M6281 Muscle weakness (generalized): Secondary | ICD-10-CM

## 2017-09-28 DIAGNOSIS — M25651 Stiffness of right hip, not elsewhere classified: Secondary | ICD-10-CM

## 2017-09-28 DIAGNOSIS — R279 Unspecified lack of coordination: Secondary | ICD-10-CM

## 2017-09-28 NOTE — Therapy (Signed)
[facility] [address] [place], [place], [address] Phone: [phone]   Fax:  [phone]  Physical Therapy Treatment  Patient Details  Name: [name] [name] MRN: [id number] Date of Birth: 1955/07/26 Referring Provider: Harl Bowie   Encounter Date: 09/28/2017  PT End of Session - 09/28/17 1151    Visit Number  3    Date for PT Re-Evaluation  11/09/17    PT Start Time  1149    PT Stop Time  1230    PT Time Calculation (min)  41 min    Activity Tolerance  Patient tolerated treatment well    Behavior During Therapy  [facility] for tasks assessed/performed       Past Medical History:  Diagnosis Date  . Anemia   . Anxiety    no meds  . Aortic regurgitation   . Arthritis    "right hip; left hip; most of my joints" (05/27/2014)  . Depression   . Diverticulosis   . Essential hypertension, benign    takes Lisinopril-HCTZ daily  . High cholesterol    takes Pravastatin daily  . History of blood transfusion    no abnormal reaction noted 2015  . History of colon polyps    benign  . Insomnia    doesn't take any meds  . Joint pain   . Neuromuscular disorder ([place])   . Neuropathy    since chemo and radiation  . Nocturia   . PONV (postoperative nausea and vomiting)    hallucinations  . Rectosigmoid cancer ([place]) 2004    S/P Surgery, Chemo, Rad Tx,  (after chemo treatment menstral periods have ceased).    Past Surgical History:  Procedure Laterality Date  . ANAL RECTAL MANOMETRY N/A 07/17/2017   Procedure: ANO RECTAL MANOMETRY;  Surgeon: Mauri Pole, MD;  [facility];  Service: Endoscopy;  Laterality: N/A;  . COLECTOMY  Aug 2004   S/P Low Anterior Resxn (Sigmoid Colon)  . COLONOSCOPY    . DILATION AND CURETTAGE OF UTERUS  1991  . LUMBAR DISC SURGERY  Feb 2007  . TOTAL HIP ARTHROPLASTY Right 05/27/2014   Procedure: RIGHT TOTAL HIP ARTHROPLASTY ANTERIOR APPROACH;  Surgeon: Hessie Dibble,  MD;  Location: [facility];  Service: Orthopedics;  Laterality: Right;  . TOTAL HIP ARTHROPLASTY Left 08/25/2015   Procedure: TOTAL HIP ARTHROPLASTY ANTERIOR APPROACH;  Surgeon: Melrose Nakayama, MD;  Location: [facility];  Service: Orthopedics;  Laterality: Left;    There were no vitals filed for this visit.  Subjective Assessment - 09/28/17 1151    Subjective  I've been doing my exercises and I have not had to take laxatives.  The abdominal massage has been helping.  Still has incomplete bowel movements    Pertinent History  THA february 2017, colon cancer, lumbar disc surgery    Patient Stated Goals  be able to have more regular bowel movement, at least 1x/day    Currently in Pain?  No/denies                   Pelvic Floor Special Questions - 09/28/17 0001    Biofeedback  quick contracting, contract and hold, contract on exhale, TrA with exhale and relaxed sphincter muscles    Biofeedback sensor type  Surface        [facility] Adult PT Treatment/Exercise - 09/28/17 0001      Neuro Re-ed    Neuro Re-ed Details   biofeedback: rest 2.67mV, quick contract (difficulty relaxing  after contraction 15.70mV, 10 sec hold 16.66mV, 20 sec hold 66mV, post baseline 2.57mV               PT Short Term Goals - 09/28/17 1231      PT SHORT TERM GOAL #1   Title  pt will be independent with toileting techniques and diaphragmatic breathing technique for improved intra-abdominal pressure during bowel movement    Time  4    Period  Weeks    Status  On-going      PT SHORT TERM GOAL #2   Title  ind with initial HEP    Time  4    Period  Weeks    Status  Achieved      PT SHORT TERM GOAL #3   Title  able to correctly engage Transverse abdominus for improved functional activities and abdominal wall support    Time  4    Period  Weeks    Status  On-going        PT Long Term Goals - 09/14/17 1440      PT LONG TERM GOAL #1   Title  pt will be ind with advanced HEP    Time  8    Period  Weeks     Status  New    Target Date  11/09/17      PT LONG TERM GOAL #2   Title  pt will have bowel movement at least 1x/day for reduced abdominal discomfort    Time  8    Period  Weeks    Status  New    Target Date  11/09/17      PT LONG TERM GOAL #3   Title  pt will be able to verbalize understanding of at least 2 techniques to relieve symptoms of constipation without use of laxitives    Time  8    Period  Weeks    Status  New    Target Date  11/09/17      PT LONG TERM GOAL #4   Title  Pt will be able to demonstrate left hip flexion and abduction of at least 4+/5 for improved stability without overuse of pelvic floor    Time  8    Period  Weeks    Status  New    Target Date  11/09/17      PT LONG TERM GOAL #5   Title  pt will report ability to perform Knack in order to control urinary leakage when coughing or sneezing    Time  8    Period  Weeks    Status  New    Target Date  11/09/17            Plan - 09/28/17 1458    Clinical Impression Statement  Patient demonstrated improved control of pelvic floor with breathing using biofeedback. She had some difficulty coordinating TrA contraction with exhale but was able to do without bearing down using tactile and verbal cues.  Pt continues to need skilled PT to work on muscle coordination for improving self care functional activities.    Clinical Impairments Affecting Rehab Potential  chronic condition, moderate complexity    PT Treatment/Interventions  ADLs/Self Care Home Management;Biofeedback;Electrical Stimulation;Iontophoresis 4mg /ml Dexamethasone;Moist Heat;Functional mobility training;Therapeutic activities;Therapeutic exercise;Neuromuscular re-education;Manual techniques;Passive range of motion;Dry needling;Taping    PT Next Visit Plan  thoracic extension, continue biofeedback, stretches, review TrA activation in breathing    Consulted and Agree with Plan of Care  Patient  Patient will benefit from skilled therapeutic  intervention in order to improve the following deficits and impairments:  Pain, Increased fascial restricitons, Decreased strength, Increased muscle spasms, Impaired tone, Postural dysfunction, Decreased range of motion, Impaired flexibility  Visit Diagnosis: Unspecified lack of coordination  Muscle weakness (generalized)  Stiffness of right hip, not elsewhere classified  Cramp and spasm     Problem List Patient Active Problem List   Diagnosis Date Noted  . Constipation due to outlet dysfunction   . History of total right hip replacement 12/08/2015  . History of left hip replacement 10/06/2015  . Primary osteoarthritis of left hip 08/25/2015  . Postoperative anemia due to acute blood loss 05/29/2014  . Primary osteoarthritis of right hip 05/27/2014  . ANXIETY DISORDER- mixed disorder w/ depressive symptoms 02/07/2014  . Knee pain   . PERIPHERAL NEUROPATHY 10/13/2007  . HYPERTENSION 10/13/2007  . HERNIA, UMBILICAL 11/88/6773  . COLONIC POLYPS, ADENOMATOUS 02/20/2003  . DIVERTICULOSIS, COLON 02/20/2003  . ADENOCARCINOMA, COLON, SIGMOID, HX OF 02/20/2003    Zannie Cove, PT 09/28/2017, 3:36 PM  [facility] [address] [place], [place], [address] Phone: [phone]   Fax:  [phone]  Name: [name] [name] MRN: [id number] Date of Birth: 1956-01-01

## 2017-10-09 ENCOUNTER — Ambulatory Visit (INDEPENDENT_AMBULATORY_CARE_PROVIDER_SITE_OTHER): Payer: 59

## 2017-10-09 ENCOUNTER — Other Ambulatory Visit: Payer: Self-pay

## 2017-10-09 ENCOUNTER — Ambulatory Visit: Payer: 59 | Admitting: Physician Assistant

## 2017-10-09 ENCOUNTER — Encounter: Payer: Self-pay | Admitting: Physician Assistant

## 2017-10-09 VITALS — BP 152/78 | HR 98 | Temp 97.7°F | Resp 18 | Ht 62.21 in | Wt 159.2 lb

## 2017-10-09 DIAGNOSIS — M5412 Radiculopathy, cervical region: Secondary | ICD-10-CM

## 2017-10-09 DIAGNOSIS — R030 Elevated blood-pressure reading, without diagnosis of hypertension: Secondary | ICD-10-CM

## 2017-10-09 DIAGNOSIS — M542 Cervicalgia: Secondary | ICD-10-CM

## 2017-10-09 DIAGNOSIS — M4722 Other spondylosis with radiculopathy, cervical region: Secondary | ICD-10-CM

## 2017-10-09 MED ORDER — PREDNISONE 20 MG PO TABS: ORAL_TABLET | ORAL | 0 refills | Status: DC

## 2017-10-09 NOTE — Patient Instructions (Addendum)
We are going to treat your underlying inflammation with oral prednisone. Prednisone is a steroid and can cause side effects such as headache, irritability, nausea, vomiting, increased heart rate, increased blood pressure, increased blood sugar, appetite changes, and insomnia. Please take tablets in the morning with a full meal to help decrease the chances of these side effects.    Do not take meloxicam while on this medication. Monitor blood pressure while taking it. Return to clinic if symptoms worsen, do not improve with treatment, or as needed  Cervical Radiculopathy Cervical radiculopathy means that a nerve in the neck is pinched or bruised. This can cause pain or loss of feeling (numbness) that runs from your neck to your arm and fingers. Follow these instructions at home: Managing pain  Take over-the-counter and prescription medicines only as told by your doctor.  If directed, put ice on the injured or painful area. ? Put ice in a plastic bag. ? Place a towel between your skin and the bag. ? Leave the ice on for 20 minutes, 2-3 times per day.  If ice does not help, you can try using heat. Take a warm shower or warm bath, or use a heat pack as told by your doctor.  You may try a gentle neck and shoulder massage. Activity  Rest as needed. Follow instructions from your doctor about any activities to avoid.  Do exercises as told by your doctor or physical therapist. General instructions  If you were given a soft collar, wear it as told by your doctor.  Use a flat pillow when you sleep.  Keep all follow-up visits as told by your doctor. This is important. Contact a doctor if:  Your condition does not improve with treatment. Get help right away if:  Your pain gets worse and is not controlled with medicine.  You lose feeling or feel weak in your hand, arm, face, or leg.  You have a fever.  You have a stiff neck.  You cannot control when you poop or pee (have  incontinence).  You have trouble with walking, balance, or talking. This information is not intended to replace advice given to you by your health care provider. Make sure you discuss any questions you have with your health care provider. Document Released: 06/16/2011 Document Revised: 12/03/2015 Document Reviewed: 08/21/2014 Elsevier Interactive Patient Education  2018 [employer].    IF you received an x-ray today, you will receive an invoice from [facility]. Please contact [facility] at [phone] with questions or concerns regarding your invoice.   IF you received labwork today, you will receive an invoice from [facility]. Please contact [facility] at [phone] with questions or concerns regarding your invoice.   Our billing staff will not be able to assist you with questions regarding bills from these companies.  You will be contacted with the lab results as soon as they are available. The fastest way to get your results is to activate your My Chart account. Instructions are located on the last page of this paperwork. If you have not heard from [name] regarding the results in 2 weeks, please contact this office.

## 2017-10-09 NOTE — Progress Notes (Signed)
[name]  MRN: [id number] DOB: [birth year]  Subjective:  [name] [name] is a 62 y.o. female seen in office today for a chief complaint of right arm and right sided neck pain x 6 days.  Has associated tingling/prickly sensation in her right hand. She works at [employer] job and is constantly looking down.  Denies dropping objects, weakness, cold sensation, redness, warmth, and pallor.  No acute injury she is aware of.  Has tried meloxicam and pain patches with no full relief. Has history of hypertension.  Controlled on lisinopril/HCTZ.  No current symptoms.  No prior history of diabetes.   Review of Systems  Constitutional: Negative for chills, diaphoresis and fever.  Eyes: Negative for visual disturbance.  Respiratory: Negative for cough and shortness of breath.   Cardiovascular: Negative for chest pain and palpitations.  Gastrointestinal: Negative for abdominal pain, nausea and vomiting.  Genitourinary: Negative for hematuria.  Neurological: Negative for dizziness and light-headedness.    Patient Active Problem List   Diagnosis Date Noted  . Constipation due to outlet dysfunction   . History of total right hip replacement 12/08/2015  . History of left hip replacement 10/06/2015  . Primary osteoarthritis of left hip 08/25/2015  . Postoperative anemia due to acute blood loss 05/29/2014  . Primary osteoarthritis of right hip 05/27/2014  . ANXIETY DISORDER- mixed disorder w/ depressive symptoms 02/07/2014  . Knee pain   . PERIPHERAL NEUROPATHY 10/13/2007  . HYPERTENSION 10/13/2007  . HERNIA, UMBILICAL 23/55/7322  . COLONIC POLYPS, ADENOMATOUS 02/20/2003  . DIVERTICULOSIS, COLON 02/20/2003  . ADENOCARCINOMA, COLON, SIGMOID, HX OF 02/20/2003    Current Outpatient Medications on File Prior to Visit  Medication Sig Dispense Refill  . Artificial Tear Ointment (DRY EYES OP) Apply 1 drop to eye every morning.    . linaclotide (LINZESS) 72 MCG capsule Take 1 capsule (72 mcg total) by  mouth daily before breakfast. 30 capsule 3  . lisinopril-hydrochlorothiazide (PRINZIDE,ZESTORETIC) 20-12.5 MG per tablet Take 2 tablets by mouth daily.    . meloxicam (MOBIC) 15 MG tablet TAKE 1 TABLET EVERY DAY 90 tablet 3  . pravastatin (PRAVACHOL) 80 MG tablet Take 80 mg by mouth daily.     Current Facility-Administered Medications on File Prior to Visit  Medication Dose Route Frequency Provider Last Rate Last Dose  . 0.9 %  sodium chloride infusion  500 mL Intravenous Continuous Nandigam, Venia Minks, MD        No Known Allergies    Social History   Socioeconomic History  . Marital status: Married    Spouse name: Not on file  . Number of children: 1  . Years of education: Not on file  . Highest education level: Not on file  Occupational History  . Occupation: office work  Scientific laboratory technician  . Financial resource strain: Not on file  . Food insecurity:    Worry: Not on file    Inability: Not on file  . Transportation needs:    Medical: Not on file    Non-medical: Not on file  Tobacco Use  . Smoking status: Former Smoker    Types: Cigarettes  . Smokeless tobacco: Never Used  . Tobacco comment: "smoked some socially in my college days"  Substance and Sexual Activity  . Alcohol use: Yes    Comment: occasionally beer/wine  . Drug use: No  . Sexual activity: Yes    Birth control/protection: None    Comment: number of sex partners in the last 54 months  1  Lifestyle  . Physical activity:    Days per week: Not on file    Minutes per session: Not on file  . Stress: Not on file  Relationships  . Social connections:    Talks on phone: Not on file    Gets together: Not on file    Attends religious service: Not on file    Active member of club or organization: Not on file    Attends meetings of clubs or organizations: Not on file    Relationship status: Not on file  . Intimate partner violence:    Fear of current or ex partner: Not on file    Emotionally abused: Not on file     Physically abused: Not on file    Forced sexual activity: Not on file  Other Topics Concern  . Not on file  Social History Narrative   Exercise walking 2 x weekly   Patient's family history -  UNKNOWN ADOPTED    Objective:  BP (!) 154/70 (BP Location: Left Arm, Patient Position: Sitting, Cuff Size: Normal)   Pulse 98   Temp 97.7 F (36.5 C) (Oral)   Resp 18   Ht 5' 2.21" (1.58 m)   Wt 159 lb 3.2 oz (72.2 kg)   SpO2 98%   BMI 28.93 kg/m   Physical Exam  Constitutional: She is oriented to person, place, and time and well-developed, well-nourished, and in no distress.  HENT:  Head: Normocephalic and atraumatic.  Eyes: Conjunctivae are normal.  Neck: Normal range of motion.  Cardiovascular: Normal rate, regular rhythm and normal heart sounds.  Pulmonary/Chest: Effort normal.  Musculoskeletal:       Right shoulder: She exhibits tenderness (with palpation of posterior musculature).       Right elbow: Normal.      Right wrist: Normal.       Cervical back: She exhibits tenderness (with palpation of right side musculature). She exhibits normal range of motion and no bony tenderness.       Right hand: Normal. She exhibits normal range of motion and normal capillary refill. Normal sensation noted. Normal strength noted.  Neurological: She is alert and oriented to person, place, and time. She has normal strength. Gait normal.  Positive Spurling's maneuver to right.   Positive shoulder abduction relief test to right.   Sensation of bilateral upper extremities intact.  Skin: Skin is warm and dry.  Psychiatric: Affect normal.  Vitals reviewed.  Dg Cervical Spine Complete  Result Date: 10/09/2017 CLINICAL DATA:  Neck pain with cervical radiculopathy. EXAM: CERVICAL SPINE - COMPLETE 4+ VIEW COMPARISON:  None. FINDINGS: No fracture or significant spondylolisthesis is noted. Severe degenerative disc disease is noted at C4-5, C5-6 and C6-7 with anterior osteophyte formation. No  prevertebral soft tissue swelling is noted. Moderate bilateral neural foraminal stenosis is noted at C4-5, C5-6 and C6-7 secondary to uncovertebral spurring. IMPRESSION: Severe multilevel degenerative disc disease with moderate bilateral neural foraminal stenosis secondary to uncovertebral spurring. No acute abnormality is noted in the cervical spine. Electronically Signed   By: Marijo Conception, M.D.   On: 10/09/2017 15:57    Assessment and Plan :  1. Cervical radiculopathy History and physical exam findings consistent with cervical radiculopathy. ?Sensation and strength intact. Painful in the cervical spine show severe multilevel degenerative disc disease.  Recommend prednisone taper at this time.  Also recommended applying ice to affected area and resting.  Educated on potential side effects of prednisone.  Patient does have HTN  and it is slightly uncontrolled in office today.  Encouraged patient to check her blood pressure readings outside the office and return to office if consistently elevated above range.  Advised to return to office if symptoms persist despite treatment, will consider referral to neurology at that time. - predniSONE (DELTASONE) 20 MG tablet; Take 3 PO QAM x3days, 2 PO QAM x3days, 1 PO QAM x3days  Dispense: 18 tablet; Refill: 0  2. Neck pain - DG Cervical Spine Complete; Future 3. Osteoarthritis of spine with radiculopathy, cervical region 4. Elevated blood pressure reading  Tenna Delaine PA-C  [facility] 10/09/2017 3:26 PM

## 2017-10-10 ENCOUNTER — Ambulatory Visit: Payer: 59 | Admitting: Physical Therapy

## 2017-10-23 ENCOUNTER — Ambulatory Visit: Payer: 59 | Attending: Gastroenterology | Admitting: Physical Therapy

## 2017-10-23 ENCOUNTER — Encounter: Payer: Self-pay | Admitting: Physical Therapy

## 2017-10-23 DIAGNOSIS — R279 Unspecified lack of coordination: Secondary | ICD-10-CM | POA: Insufficient documentation

## 2017-10-23 DIAGNOSIS — M6281 Muscle weakness (generalized): Secondary | ICD-10-CM | POA: Insufficient documentation

## 2017-10-23 DIAGNOSIS — M25651 Stiffness of right hip, not elsewhere classified: Secondary | ICD-10-CM | POA: Insufficient documentation

## 2017-10-23 DIAGNOSIS — R252 Cramp and spasm: Secondary | ICD-10-CM | POA: Diagnosis present

## 2017-10-23 NOTE — Patient Instructions (Signed)
Access Code: [email or web address]  URL: [email or web address]  Date: 10/23/2017  Prepared by: Lovett Calender   Exercises  Thoracic Extension Mobilization with Noodle - 10 reps - 3 sets - 1x daily - 7x weekly  Sidelying Thoracic Rotation with Open Book - 3 reps - 1 sets - 30 hold - 1x daily - 7x weekly

## 2017-10-23 NOTE — Therapy (Signed)
[facility] [address], [address] [place], [place], [address] Phone: [phone]   Fax:  [phone]  Physical Therapy Treatment  Patient Details  Name: [name] [name] MRN: [id number] Date of Birth: August 15, 1955 Referring Provider: Harl Bowie   Encounter Date: 10/23/2017  PT End of Session - 10/23/17 1146    Visit Number  4    Date for PT Re-Evaluation  11/09/17    PT Start Time  1146    PT Stop Time  1226    PT Time Calculation (min)  40 min    Activity Tolerance  Patient tolerated treatment well    Behavior During Therapy  [facility] for tasks assessed/performed       Past Medical History:  Diagnosis Date  . Anemia   . Anxiety    no meds  . Aortic regurgitation   . Arthritis    "right hip; left hip; most of my joints" (05/27/2014)  . Depression   . Diverticulosis   . Essential hypertension, benign    takes Lisinopril-HCTZ daily  . High cholesterol    takes Pravastatin daily  . History of blood transfusion    no abnormal reaction noted 2015  . History of colon polyps    benign  . Insomnia    doesn't take any meds  . Joint pain   . Neuromuscular disorder ([place])   . Neuropathy    since chemo and radiation  . Nocturia   . PONV (postoperative nausea and vomiting)    hallucinations  . Rectosigmoid cancer ([place]) 2004    S/P Surgery, Chemo, Rad Tx,  (after chemo treatment menstral periods have ceased).    Past Surgical History:  Procedure Laterality Date  . ANAL RECTAL MANOMETRY N/A 07/17/2017   Procedure: ANO RECTAL MANOMETRY;  Surgeon: Mauri Pole, MD;  Location: [facility];  Service: Endoscopy;  Laterality: N/A;  . COLECTOMY  Aug 2004   S/P Low Anterior Resxn (Sigmoid Colon)  . COLONOSCOPY    . DILATION AND CURETTAGE OF UTERUS  1991  . LUMBAR DISC SURGERY  Feb 2007  . TOTAL HIP ARTHROPLASTY Right 05/27/2014   Procedure: RIGHT TOTAL HIP ARTHROPLASTY ANTERIOR APPROACH;  Surgeon: Hessie Dibble,  MD;  Location: [place];  Service: Orthopedics;  Laterality: Right;  . TOTAL HIP ARTHROPLASTY Left 08/25/2015   Procedure: TOTAL HIP ARTHROPLASTY ANTERIOR APPROACH;  Surgeon: Melrose Nakayama, MD;  Location: [place];  Service: Orthopedics;  Laterality: Left;    There were no vitals filed for this visit.  Subjective Assessment - 10/23/17 1149    Subjective  I have been traveling and so much new food.  It isn't too bad though considering that.    Pertinent History  THA february 2017, colon cancer, lumbar disc surgery    Patient Stated Goals  be able to have more regular bowel movement, at least 1x/day    Currently in Pain?  No/denies                       [facility] Adult PT Treatment/Exercise - 10/23/17 0001      Neuro Re-ed    Neuro Re-ed Details   seated on ball and breathing and bulging pelvic floor, circles and rolling side to side      Lumbar Exercises: Stretches   Other Lumbar Stretch Exercise  open book rotation in sidelying and thoracic rotation in supine - 10x each      Lumbar Exercises: Supine   Other  Supine Lumbar Exercises  thoracic extension with towel roll - pec stretch and UE extension      Manual Therapy   Manual therapy comments  bilateral (Rt>Lt): glutes, lumbar paraspinals, calf             PT Education - 10/23/17 1229    Education provided  Yes    Education Details  thoracic ext and rotation Access Code: [email or web address]     Person(s) Educated  Patient    Methods  Explanation;Demonstration;Verbal cues;Handout    Comprehension  Verbalized understanding;Returned demonstration       PT Short Term Goals - 09/28/17 1231      PT SHORT TERM GOAL #1   Title  pt will be independent with toileting techniques and diaphragmatic breathing technique for improved intra-abdominal pressure during bowel movement    Time  4    Period  Weeks    Status  On-going      PT SHORT TERM GOAL #2   Title  ind with initial HEP    Time  4    Period  Weeks    Status  Achieved       PT SHORT TERM GOAL #3   Title  able to correctly engage Transverse abdominus for improved functional activities and abdominal wall support    Time  4    Period  Weeks    Status  On-going        PT Long Term Goals - 09/14/17 1440      PT LONG TERM GOAL #1   Title  pt will be ind with advanced HEP    Time  8    Period  Weeks    Status  New    Target Date  11/09/17      PT LONG TERM GOAL #2   Title  pt will have bowel movement at least 1x/day for reduced abdominal discomfort    Time  8    Period  Weeks    Status  New    Target Date  11/09/17      PT LONG TERM GOAL #3   Title  pt will be able to verbalize understanding of at least 2 techniques to relieve symptoms of constipation without use of laxitives    Time  8    Period  Weeks    Status  New    Target Date  11/09/17      PT LONG TERM GOAL #4   Title  Pt will be able to demonstrate left hip flexion and abduction of at least 4+/5 for improved stability without overuse of pelvic floor    Time  8    Period  Weeks    Status  New    Target Date  11/09/17      PT LONG TERM GOAL #5   Title  pt will report ability to perform Knack in order to control urinary leakage when coughing or sneezing    Time  8    Period  Weeks    Status  New    Target Date  11/09/17            Plan - 10/23/17 1722    Clinical Impression Statement  Pt has fascial restrictions throughout lumbar, glute and LE posterior kinetic chain  Pt responded well to manual therapy to loosen fascial adhesions and lengthen soft tissue.  Pt noticed some feeling of bowel movement with doing the breathing technique while sitting on the ball.  Pt will benefit  from skilled PT to continue working on posture and muscle coordination for improved bowel function.    Clinical Impairments Affecting Rehab Potential  chronic condition, moderate complexity    PT Treatment/Interventions  ADLs/Self Care Home Management;Biofeedback;Electrical Stimulation;Iontophoresis  4mg /ml Dexamethasone;Moist Heat;Functional mobility training;Therapeutic activities;Therapeutic exercise;Neuromuscular re-education;Manual techniques;Passive range of motion;Dry needling;Taping    PT Next Visit Plan  thoracic extension, posture strength, stretches, review TrA activation in breathing, sitting on ball, STM as needed    Consulted and Agree with Plan of Care  Patient       Patient will benefit from skilled therapeutic intervention in order to improve the following deficits and impairments:  Pain, Increased fascial restricitons, Decreased strength, Increased muscle spasms, Impaired tone, Postural dysfunction, Decreased range of motion, Impaired flexibility  Visit Diagnosis: Unspecified lack of coordination  Muscle weakness (generalized)  Stiffness of right hip, not elsewhere classified  Cramp and spasm     Problem List Patient Active Problem List   Diagnosis Date Noted  . Constipation due to outlet dysfunction   . History of total right hip replacement 12/08/2015  . History of left hip replacement 10/06/2015  . Primary osteoarthritis of left hip 08/25/2015  . Postoperative anemia due to acute blood loss 05/29/2014  . Primary osteoarthritis of right hip 05/27/2014  . ANXIETY DISORDER- mixed disorder w/ depressive symptoms 02/07/2014  . Knee pain   . PERIPHERAL NEUROPATHY 10/13/2007  . HYPERTENSION 10/13/2007  . HERNIA, UMBILICAL 33/35/4562  . COLONIC POLYPS, ADENOMATOUS 02/20/2003  . DIVERTICULOSIS, COLON 02/20/2003  . ADENOCARCINOMA, COLON, SIGMOID, HX OF 02/20/2003    Zannie Cove, PT 10/23/2017, 5:33 PM  [facility] [address], [place], [place], [address] Phone: [phone]   Fax:  [phone]  Name: REIGHLYNN SWINEY MRN: [id number] Date of Birth: 11/19/1955

## 2017-10-31 ENCOUNTER — Ambulatory Visit: Payer: 59 | Admitting: Physical Therapy

## 2017-11-07 ENCOUNTER — Ambulatory Visit: Payer: 59 | Admitting: Physical Therapy

## 2017-11-07 DIAGNOSIS — R252 Cramp and spasm: Secondary | ICD-10-CM

## 2017-11-07 DIAGNOSIS — M25651 Stiffness of right hip, not elsewhere classified: Secondary | ICD-10-CM

## 2017-11-07 DIAGNOSIS — R279 Unspecified lack of coordination: Secondary | ICD-10-CM | POA: Diagnosis not present

## 2017-11-07 DIAGNOSIS — M6281 Muscle weakness (generalized): Secondary | ICD-10-CM

## 2017-11-07 NOTE — Patient Instructions (Signed)
Access Code: [email or web address]  URL: [email or web address]  Date: 11/07/2017  Prepared by: Lovett Calender   Exercises  Thoracic Extension Mobilization with Noodle - 10 reps - 3 sets - 1x daily - 7x weekly  Sidelying Thoracic Rotation with Open Book - 3 reps - 1 sets - 30 hold - 1x daily - 7x weekly  Hooklying Hamstring Stretch with Strap - 3 reps - 1 sets - 30 hold - 1x daily - 7x weekly  Supine Transversus Abdominis Bracing - Hands on Thighs - 10 reps - 2 sets - 5 hold - 1x daily - 7x weekly

## 2017-11-07 NOTE — Therapy (Signed)
[facility] [address] [place], [place], [address] Phone: [phone]   Fax:  [phone]  Physical Therapy Treatment  Patient Details  Name: [name] [name] MRN: [id number] Date of Birth: July 20, 1955 Referring Provider: Harl Bowie   Encounter Date: 11/07/2017  PT End of Session - 11/07/17 1149    Visit Number  5    Date for PT Re-Evaluation  01/02/18    PT Start Time  1148    PT Stop Time  1230    PT Time Calculation (min)  42 min    Activity Tolerance  Patient tolerated treatment well    Behavior During Therapy  [facility] for tasks assessed/performed       Past Medical History:  Diagnosis Date  . Anemia   . Anxiety    no meds  . Aortic regurgitation   . Arthritis    "right hip; left hip; most of my joints" (05/27/2014)  . Depression   . Diverticulosis   . Essential hypertension, benign    takes Lisinopril-HCTZ daily  . High cholesterol    takes Pravastatin daily  . History of blood transfusion    no abnormal reaction noted 2015  . History of colon polyps    benign  . Insomnia    doesn't take any meds  . Joint pain   . Neuromuscular disorder ([place])   . Neuropathy    since chemo and radiation  . Nocturia   . PONV (postoperative nausea and vomiting)    hallucinations  . Rectosigmoid cancer ([place]) 2004    S/P Surgery, Chemo, Rad Tx,  (after chemo treatment menstral periods have ceased).    Past Surgical History:  Procedure Laterality Date  . ANAL RECTAL MANOMETRY N/A 07/17/2017   Procedure: ANO RECTAL MANOMETRY;  Surgeon: Mauri Pole, MD;  [facility];  Service: Endoscopy;  Laterality: N/A;  . COLECTOMY  Aug 2004   S/P Low Anterior Resxn (Sigmoid Colon)  . COLONOSCOPY    . DILATION AND CURETTAGE OF UTERUS  1991  . LUMBAR DISC SURGERY  Feb 2007  . TOTAL HIP ARTHROPLASTY Right 05/27/2014   Procedure: RIGHT TOTAL HIP ARTHROPLASTY ANTERIOR APPROACH;  Surgeon: Hessie Dibble,  MD;  Location: [place];  Service: Orthopedics;  Laterality: Right;  . TOTAL HIP ARTHROPLASTY Left 08/25/2015   Procedure: TOTAL HIP ARTHROPLASTY ANTERIOR APPROACH;  Surgeon: Melrose Nakayama, MD;  Location: [place];  Service: Orthopedics;  Laterality: Left;    There were no vitals filed for this visit.      [facility] Surgical PT Assessment - 11/07/17 0001      Strength   Right Hip ABduction  4+/5    Right Hip ADduction  4+/5    Left Hip Flexion  4+/5    Left Hip Extension  4/5    Left Hip ABduction  4-/5    Left Hip ADduction  4-/5      Palpation   SI assessment   posterior sacral rotation Lt side    Palpation comment  tender left SIJ      Special Tests   Other special tests  easier to perform right SLR with pelvic compression                   [facility] Adult PT Treatment/Exercise - 11/07/17 0001      Self-Care   Self-Care  Other Self-Care Comments    Other Self-Care Comments   breathing with tactile ceus to bulge pelvic floor  Neuro Re-ed    Neuro Re-ed Details   seated on ball boundcing and breathing and bulging pelvic floor, circles and rolling side to side; TrA activation and breathing      Lumbar Exercises: Supine   Ab Set  15 reps;5 seconds pressing into thigh    Large Ball Oblique Isometric  10 reps;3 seconds rolling ball side to side with breathing             PT Education - 11/07/17 1232    Education provided  Yes    Education Details  Access Code: [employer]     Person(s) Educated  Patient    Methods  Explanation;Demonstration;Handout    Comprehension  Verbalized understanding;Returned demonstration       PT Short Term Goals - 11/07/17 1240      PT SHORT TERM GOAL #1   Title  pt will be independent with toileting techniques and diaphragmatic breathing technique for improved intra-abdominal pressure during bowel movement    Baseline  still learning correct breathing for toileting techniques      PT SHORT TERM GOAL #2   Title  ind with initial HEP     Time  4    Period  Weeks    Status  Achieved      PT SHORT TERM GOAL #3   Title  able to correctly engage Transverse abdominus for improved functional activities and abdominal wall support    Time  4    Period  Weeks    Status  Achieved        PT Long Term Goals - 11/07/17 1151      PT LONG TERM GOAL #1   Title  pt will be ind with advanced HEP    Baseline  still learning    Time  8    Period  Weeks    Status  On-going    Target Date  01/02/18      PT LONG TERM GOAL #2   Title  pt will have bowel movement at least 1x/day without using laxitive >1x/week    Baseline  yes but has had to take 1 laxitive every other day; original goal met    Time  8    Period  Weeks    Status  Revised    Target Date  01/02/18      PT LONG TERM GOAL #3   Title  pt will be able to verbalize understanding of at least 2 techniques to relieve symptoms of constipation without use of laxitives    Baseline  abdominal massage helps; still learning toilet techniques    Time  8    Period  Weeks    Status  On-going    Target Date  01/02/18      PT LONG TERM GOAL #4   Title  Pt will be able to demonstrate left hip flexion and abduction of at least 4+/5 for improved stability without overuse of pelvic floor    Baseline  hip flexion improved 4+/5    Time  8    Status  On-going    Target Date  01/02/18      PT LONG TERM GOAL #5   Title  pt will report ability to perform Knack in order to control urinary leakage when coughing or sneezing    Baseline  has not had leakage    Time  8    Period  Weeks    Status  Achieved    Target Date  01/02/18            Plan - 11/07/17 1233    Clinical Impression Statement  Patient has made progress towards functional goals and is having regular bowel movements even with limited number of visits.  She is able to perform abdominal massage correctly.  She is still having difficulty coordinating pelvic floor relaxation with breathing and TrA activation for maximum  function with toileting.  She demonstrates posture abnormalities, posterior rotation of left sacrum, bilateral LE and core weakness.  Pt had to miss several appointments due to traveling and illness. She will benefit from skilled PT to address impairments as mentioned above.    Rehab Potential  Excellent    Clinical Impairments Affecting Rehab Potential  chronic condition, moderate complexity    PT Frequency  1x / week    PT Duration  8 weeks    PT Treatment/Interventions  ADLs/Self Care Home Management;Biofeedback;Electrical Stimulation;Iontophoresis 29m/ml Dexamethasone;Moist Heat;Functional mobility training;Therapeutic activities;Therapeutic exercise;Neuromuscular re-education;Manual techniques;Passive range of motion;Dry needling;Taping    PT Next Visit Plan  biofeedback for downtraining, thoracic extension, posture strength, stretches, review TrA activation in breathing, sitting on ball, STM as needed    Consulted and Agree with Plan of Care  Patient       Patient will benefit from skilled therapeutic intervention in order to improve the following deficits and impairments:  Pain, Increased fascial restricitons, Decreased strength, Increased muscle spasms, Impaired tone, Postural dysfunction, Decreased range of motion, Impaired flexibility  Visit Diagnosis: Unspecified lack of coordination  Muscle weakness (generalized)  Stiffness of right hip, not elsewhere classified  Cramp and spasm     Problem List Patient Active Problem List   Diagnosis Date Noted  . Constipation due to outlet dysfunction   . History of total right hip replacement 12/08/2015  . History of left hip replacement 10/06/2015  . Primary osteoarthritis of left hip 08/25/2015  . Postoperative anemia due to acute blood loss 05/29/2014  . Primary osteoarthritis of right hip 05/27/2014  . ANXIETY DISORDER- mixed disorder w/ depressive symptoms 02/07/2014  . Knee pain   . PERIPHERAL NEUROPATHY 10/13/2007  .  HYPERTENSION 10/13/2007  . HERNIA, UMBILICAL [id number]/6803 . COLONIC POLYPS, ADENOMATOUS 02/20/2003  . DIVERTICULOSIS, COLON 02/20/2003  . ADENOCARCINOMA, COLON, SIGMOID, HX OF 02/20/2003    JZannie Cove PT 11/07/2017, 1:14 PM  [facility] [address] [place] [address]Phone: [phone]  Fax:  [phone] Name: AKALIANA ALBINOMRN: [id number] of Birth: [birth year]

## 2017-11-14 ENCOUNTER — Ambulatory Visit: Payer: 59 | Attending: Gastroenterology | Admitting: Physical Therapy

## 2017-11-14 DIAGNOSIS — M6281 Muscle weakness (generalized): Secondary | ICD-10-CM

## 2017-11-14 DIAGNOSIS — R279 Unspecified lack of coordination: Secondary | ICD-10-CM | POA: Diagnosis not present

## 2017-11-14 DIAGNOSIS — M25651 Stiffness of right hip, not elsewhere classified: Secondary | ICD-10-CM

## 2017-11-14 DIAGNOSIS — R252 Cramp and spasm: Secondary | ICD-10-CM | POA: Diagnosis present

## 2017-11-14 NOTE — Therapy (Addendum)
[facility] [address] [place], [place], [address] Phone: [phone]   Fax:  [phone]  Physical Therapy Treatment  Patient Details  Name: [name] [name] MRN: [id number] Date of Birth: 04-08-1956 Referring Provider: Harl Bowie   Encounter Date: 11/14/2017  PT End of Session - 11/14/17 1245    Visit Number  6    Date for PT Re-Evaluation  01/02/18    PT Start Time  1147    PT Stop Time  1240    PT Time Calculation (min)  53 min    Activity Tolerance  Patient tolerated treatment well    Behavior During Therapy  [facility] for tasks assessed/performed       Past Medical History:  Diagnosis Date  . Anemia   . Anxiety    no meds  . Aortic regurgitation   . Arthritis    "right hip; left hip; most of my joints" (05/27/2014)  . Depression   . Diverticulosis   . Essential hypertension, benign    takes Lisinopril-HCTZ daily  . High cholesterol    takes Pravastatin daily  . History of blood transfusion    no abnormal reaction noted 2015  . History of colon polyps    benign  . Insomnia    doesn't take any meds  . Joint pain   . Neuromuscular disorder ([place])   . Neuropathy    since chemo and radiation  . Nocturia   . PONV (postoperative nausea and vomiting)    hallucinations  . Rectosigmoid cancer ([place]) 2004    S/P Surgery, Chemo, Rad Tx,  (after chemo treatment menstral periods have ceased).    Past Surgical History:  Procedure Laterality Date  . ANAL RECTAL MANOMETRY N/A 07/17/2017   Procedure: ANO RECTAL MANOMETRY;  Surgeon: Mauri Pole, MD;  Location: [facility];  Service: Endoscopy;  Laterality: N/A;  . COLECTOMY  Aug 2004   S/P Low Anterior Resxn (Sigmoid Colon)  . COLONOSCOPY    . DILATION AND CURETTAGE OF UTERUS  1991  . LUMBAR DISC SURGERY  Feb 2007  . TOTAL HIP ARTHROPLASTY Right 05/27/2014   Procedure: RIGHT TOTAL HIP ARTHROPLASTY ANTERIOR APPROACH;  Surgeon: Hessie Dibble, MD;   Location: [place];  Service: Orthopedics;  Laterality: Right;  . TOTAL HIP ARTHROPLASTY Left 08/25/2015   Procedure: TOTAL HIP ARTHROPLASTY ANTERIOR APPROACH;  Surgeon: Melrose Nakayama, MD;  Location: [facility];  Service: Orthopedics;  Laterality: Left;    There were no vitals filed for this visit.  Subjective Assessment - 11/14/17 1244    Subjective  I have not had the leakage or urgency.  Today I am so bloated and constipated    Pertinent History  THA february 2017, colon cancer, lumbar disc surgery    Patient Stated Goals  be able to have more regular bowel movement, at least 1x/day    Currently in Pain?  No/denies                       [facility] Adult PT Treatment/Exercise - 11/14/17 0001      Neuro Re-ed    Neuro Re-ed Details   TrA and pelvic floor activation; educated and performed MET to correct pelvic obliquity      Lumbar Exercises: Standing   Row  Strengthening;Both;20 reps;Theraband    Theraband Level (Row)  Level 1 (Yellow)    Shoulder Extension  Strengthening;Both;20 reps;Theraband    Theraband Level (Shoulder Extension)  Level 1 (  Yellow)      Manual Therapy   Manual Therapy  Joint mobilization;Soft tissue mobilization;Myofascial release    Joint Mobilization  thoracic extension mobs T3-11; gentle rotation both ways T10-11    Soft tissue mobilization  thoracic paraspinals    Myofascial Release  abdominal myofascial release             PT Education - 11/14/17 1238    Education provided  Yes    Education Details   Access Code: [email or web address]     Person(s) Educated  Patient    Methods  Explanation;Demonstration;Handout;Verbal cues;Tactile cues    Comprehension  Verbalized understanding;Returned demonstration       PT Short Term Goals - 11/07/17 1240      PT SHORT TERM GOAL #1   Title  pt will be independent with toileting techniques and diaphragmatic breathing technique for improved intra-abdominal pressure during bowel movement    Baseline  still  learning correct breathing for toileting techniques      PT SHORT TERM GOAL #2   Title  ind with initial HEP    Time  4    Period  Weeks    Status  Achieved      PT SHORT TERM GOAL #3   Title  able to correctly engage Transverse abdominus for improved functional activities and abdominal wall support    Time  4    Period  Weeks    Status  Achieved        PT Long Term Goals - 11/07/17 1151      PT LONG TERM GOAL #1   Title  pt will be ind with advanced HEP    Baseline  still learning    Time  8    Period  Weeks    Status  On-going    Target Date  01/02/18      PT LONG TERM GOAL #2   Title  pt will have bowel movement at least 1x/day without using laxitive >1x/week    Baseline  yes but has had to take 1 laxitive every other day; original goal met    Time  8    Period  Weeks    Status  Revised    Target Date  01/02/18      PT LONG TERM GOAL #3   Title  pt will be able to verbalize understanding of at least 2 techniques to relieve symptoms of constipation without use of laxitives    Baseline  abdominal massage helps; still learning toilet techniques    Time  8    Period  Weeks    Status  On-going    Target Date  01/02/18      PT LONG TERM GOAL #4   Title  Pt will be able to demonstrate left hip flexion and abduction of at least 4+/5 for improved stability without overuse of pelvic floor    Baseline  hip flexion improved 4+/5    Time  8    Status  On-going    Target Date  01/02/18      PT LONG TERM GOAL #5   Title  pt will report ability to perform Knack in order to control urinary leakage when coughing or sneezing    Baseline  has not had leakage    Time  8    Period  Weeks    Status  Achieved    Target Date  01/02/18            Plan -  11/14/17 1251    Clinical Impression Statement  Pt was able to show improved pelvic alignment with MET and able to perform correctly.  Pt able to progress strength and demonstrates ability to perform exercises correctly with  education.  Pt responded well from manual therapy with improved thoracic mobility and decreased restrictions of abdominal fascia.  She will benefit from skilled PT to continue working on core and postural strength.    PT Treatment/Interventions  ADLs/Self Care Home Management;Biofeedback;Electrical Stimulation;Iontophoresis 46m/ml Dexamethasone;Moist Heat;Functional mobility training;Therapeutic activities;Therapeutic exercise;Neuromuscular re-education;Manual techniques;Passive range of motion;Dry needling;Taping    PT Next Visit Plan  thoracic extension, posture strength, stretches, review TrA activation in breathing, sitting on ball, STM and biofeedback for downtraining as needed    Consulted and Agree with Plan of Care  Patient       Patient will benefit from skilled therapeutic intervention in order to improve the following deficits and impairments:  Pain, Increased fascial restricitons, Decreased strength, Increased muscle spasms, Impaired tone, Postural dysfunction, Decreased range of motion, Impaired flexibility  Visit Diagnosis: Unspecified lack of coordination  Muscle weakness (generalized)  Stiffness of right hip, not elsewhere classified  Cramp and spasm     Problem List Patient Active Problem List   Diagnosis Date Noted  . Constipation due to outlet dysfunction   . History of total right hip replacement 12/08/2015  . History of left hip replacement 10/06/2015  . Primary osteoarthritis of left hip 08/25/2015  . Postoperative anemia due to acute blood loss 05/29/2014  . Primary osteoarthritis of right hip 05/27/2014  . ANXIETY DISORDER- mixed disorder w/ depressive symptoms 02/07/2014  . Knee pain   . PERIPHERAL NEUROPATHY 10/13/2007  . HYPERTENSION 10/13/2007  . HERNIA, UMBILICAL 060/60/0459 . COLONIC POLYPS, ADENOMATOUS 02/20/2003  . DIVERTICULOSIS, COLON 02/20/2003  . ADENOCARCINOMA, COLON, SIGMOID, HX OF 02/20/2003    JZannie Cove PT 11/14/2017, 12:55  PM  [facility] [address] [place] [address]: [phone]  Fax:  [phone] Name: AJESSIAH WOJNARMRN: [id number] of Birth: [birth year] PHYSICAL THERAPY DISCHARGE SUMMARY  Visits from Start of Care: 6  Current functional level related to goals / functional outcomes: See above remaining goals   Remaining deficits: See above details   Education / Equipment: HEP Plan: Patient agrees to discharge.  Patient goals were not met. Patient is being discharged due to meeting the stated rehab goals.  ?????    JGoogle PT 04/03/18 3:45 PM

## 2017-11-14 NOTE — Patient Instructions (Signed)
Access Code: [email or web address]  URL: [email or web address]  Date: 11/14/2017  Prepared by: Lovett Calender   Exercises  Thoracic Extension Mobilization with Noodle - 10 reps - 3 sets - 1x daily - 7x weekly  Sidelying Thoracic Rotation with Open Book - 3 reps - 1 sets - 30 hold - 1x daily - 7x weekly  Hooklying Hamstring Stretch with Strap - 3 reps - 1 sets - 30 hold - 1x daily - 7x weekly  Supine Transversus Abdominis Bracing - Hands on Thighs - 10 reps - 2 sets - 5 hold - 1x daily - 7x weekly  Hooklying Sacroiliac Joint Isometric - 3 reps - 1 sets - 20 sec hold - 1x daily - 4x weekly  Standing Shoulder Row with Anchored Resistance - 10 reps - 3 sets - 1x daily - 7x weekly  Shoulder Extension with Resistance - 10 reps - 3 sets - 1x daily - 7x weekly

## 2017-11-22 ENCOUNTER — Encounter: Payer: 59 | Admitting: Physical Therapy

## 2017-12-28 ENCOUNTER — Encounter: Payer: Self-pay | Admitting: Gastroenterology

## 2018-01-01 ENCOUNTER — Other Ambulatory Visit: Payer: Self-pay | Admitting: Physician Assistant

## 2018-01-01 DIAGNOSIS — M1990 Unspecified osteoarthritis, unspecified site: Secondary | ICD-10-CM

## 2018-01-01 NOTE — Telephone Encounter (Signed)
Patient is requesting a refill of the following medications: Requested Prescriptions   Pending Prescriptions Disp Refills  . meloxicam (MOBIC) 15 MG tablet [Pharmacy Med Name: MELOXICAM 15 MG TABLET] 90 tablet 3    Sig: TAKE 1 TABLET BY MOUTH EVERY DAY    Date of patient request: 01/01/2018 Last office visit: 10/09/2017 Date of last refill:12/26/2016 Last refill amount: 90 refills 3  Follow up time period per chart:

## 2018-01-31 ENCOUNTER — Encounter: Payer: Self-pay | Admitting: Gastroenterology

## 2018-02-14 ENCOUNTER — Other Ambulatory Visit: Payer: Self-pay

## 2018-02-14 ENCOUNTER — Ambulatory Visit (INDEPENDENT_AMBULATORY_CARE_PROVIDER_SITE_OTHER): Payer: 59 | Admitting: Physician Assistant

## 2018-02-14 ENCOUNTER — Encounter: Payer: Self-pay | Admitting: Physician Assistant

## 2018-02-14 VITALS — BP 122/68 | HR 97 | Temp 98.3°F | Resp 16 | Ht 62.21 in | Wt 162.8 lb

## 2018-02-14 DIAGNOSIS — M1990 Unspecified osteoarthritis, unspecified site: Secondary | ICD-10-CM

## 2018-02-14 DIAGNOSIS — G479 Sleep disorder, unspecified: Secondary | ICD-10-CM

## 2018-02-14 DIAGNOSIS — Z0000 Encounter for general adult medical examination without abnormal findings: Secondary | ICD-10-CM | POA: Diagnosis not present

## 2018-02-14 DIAGNOSIS — E7800 Pure hypercholesterolemia, unspecified: Secondary | ICD-10-CM | POA: Insufficient documentation

## 2018-02-14 DIAGNOSIS — Z13220 Encounter for screening for lipoid disorders: Secondary | ICD-10-CM

## 2018-02-14 DIAGNOSIS — Z130 Encounter for screening for diseases of the blood and blood-forming organs and certain disorders involving the immune mechanism: Secondary | ICD-10-CM

## 2018-02-14 DIAGNOSIS — R600 Localized edema: Secondary | ICD-10-CM

## 2018-02-14 DIAGNOSIS — I10 Essential (primary) hypertension: Secondary | ICD-10-CM

## 2018-02-14 DIAGNOSIS — M5030 Other cervical disc degeneration, unspecified cervical region: Secondary | ICD-10-CM

## 2018-02-14 DIAGNOSIS — Z131 Encounter for screening for diabetes mellitus: Secondary | ICD-10-CM | POA: Diagnosis not present

## 2018-02-14 HISTORY — DX: Pure hypercholesterolemia, unspecified: E78.00

## 2018-02-14 MED ORDER — AMITRIPTYLINE HCL 10 MG PO TABS: 10.0000 mg | ORAL_TABLET | Freq: Every day | ORAL | 0 refills | Status: DC

## 2018-02-14 MED ORDER — MELOXICAM 15 MG PO TABS
15.0000 mg | ORAL_TABLET | Freq: Every day | ORAL | 3 refills | Status: DC
Start: 2018-02-14 — End: 2019-03-05

## 2018-02-14 MED ORDER — CYCLOBENZAPRINE HCL 5 MG PO TABS: 5.0000 mg | ORAL_TABLET | Freq: Three times a day (TID) | ORAL | 1 refills | Status: DC | PRN

## 2018-02-14 NOTE — Patient Instructions (Addendum)
go2socks - on [place] - 25-38mHG compression  In order for your supplemental calcium to be effective.  Please take calcium either on an empty stomach or with food that does not contain calcium.  Your body is able only to absorb 5038mof Calcium at a time from any one source.  Do not take with a MVI with iron because iron does not allow th calcium to be absorbed.  Please take Calcium citrate 50051m-3x/day.  This supplement should have Vit D in it and if your pills do not please add addition Vit D 400 IU with each dose.  calcium content of some foods  calcium-fortified orange juice - 330-350 mg  calcium-fortified breakfast cereals (1 serving) - 200-400 mg  cheese (1 ounce) - 156-314 mg, depending on variety  cottage cheese, 1% milkfat (1 cup) - 138 mg  milk (1 cup) - nonfat 306 mg, low-fat 290 mg, whole 276 mg, soy milk 93 mg (368 mg if calcium-fortified)   IF you received an x-ray today, you will receive an invoice from [facility]. Please contact [facility] at [phone] questions or concerns regarding your invoice.   IF you received labwork today, you will receive an invoice from [facility] contact [facility] at [phone] questions or concerns regarding your invoice.   Our billing staff will not be able to assist you with questions regarding bills from these companies.  You will be contacted with the lab results as soon as they are available. The fastest way to get your results is to activate your My Chart account. Instructions are located on the last page of this paperwork. If you have not heard from us [place] the results in 2 weeks, please contact this office.    Health Maintenance, Female Adopting a healthy lifestyle and getting preventive care can go a long way to promote health and wellness. Talk with your health care provider about what schedule of regular examinations is right for you. This is a good chance for you to check in with your  provider about disease prevention and staying healthy. In between checkups, there are plenty of things you can do on your own. Experts have done a lot of research about which lifestyle changes and preventive measures are most likely to keep you healthy. Ask your health care provider for more information. Weight and diet Eat a healthy diet  Be sure to include plenty of vegetables, fruits, low-fat dairy products, and lean protein.  Do not eat a lot of foods high in solid fats, added sugars, or salt.  Get regular exercise. This is one of the most important things you can do for your health. ? Most adults should exercise for at least 150 minutes each week. The exercise should increase your heart rate and make you sweat (moderate-intensity exercise). ? Most adults should also do strengthening exercises at least twice a week. This is in addition to the moderate-intensity exercise.  Maintain a healthy weight  Body mass index (BMI) is a measurement that can be used to identify possible weight problems. It estimates body fat based on height and weight. Your health care provider can help determine your BMI and help you achieve or maintain a healthy weight.  For females 20 69ars of age and older: ? A BMI below 18.5 is considered underweight. ? A BMI of 18.5 to 24.9 is normal. ? A BMI of 25 to 29.9 is considered overweight. ? A BMI of 30 and above is considered obese.  Watch levels  of cholesterol and blood lipids  You should start having your blood tested for lipids and cholesterol at 61 years of age, then have this test every 5 years.  You may need to have your cholesterol levels checked more often if: ? Your lipid or cholesterol levels are high. ? You are older than 61 years of age. ? You are at high risk for heart disease.  Cancer screening Lung Cancer  Lung cancer screening is recommended for adults 71-65 years old who are at high risk for lung cancer because of a history of smoking.  A  yearly low-dose CT scan of the lungs is recommended for people who: ? Currently smoke. ? Have quit within the past 15 years. ? Have at least a 30-pack-year history of smoking. A pack year is smoking an average of one pack of cigarettes a day for 1 year.  Yearly screening should continue until it has been 15 years since you quit.  Yearly screening should stop if you develop a health problem that would prevent you from having lung cancer treatment.  Breast Cancer  Practice breast self-awareness. This means understanding how your breasts normally appear and feel.  It also means doing regular breast self-exams. Let your health care provider know about any changes, no matter how small.  If you are in your 20s or 30s, you should have a clinical breast exam (CBE) by a health care provider every 1-3 years as part of a regular health exam.  If you are 32 or older, have a CBE every year. Also consider having a breast X-ray (mammogram) every year.  If you have a family history of breast cancer, talk to your health care provider about genetic screening.  If you are at high risk for breast cancer, talk to your health care provider about having an MRI and a mammogram every year.  Breast cancer gene (BRCA) assessment is recommended for women who have family members with BRCA-related cancers. BRCA-related cancers include: ? Breast. ? Ovarian. ? Tubal. ? Peritoneal cancers.  Results of the assessment will determine the need for genetic counseling and BRCA1 and BRCA2 testing.  Cervical Cancer Your health care provider may recommend that you be screened regularly for cancer of the pelvic organs (ovaries, uterus, and vagina). This screening involves a pelvic examination, including checking for microscopic changes to the surface of your cervix (Pap test). You may be encouraged to have this screening done every 3 years, beginning at age 35.  For women ages 40-65, health care providers may recommend  pelvic exams and Pap testing every 3 years, or they may recommend the Pap and pelvic exam, combined with testing for human papilloma virus (HPV), every 5 years. Some types of HPV increase your risk of cervical cancer. Testing for HPV may also be done on women of any age with unclear Pap test results.  Other health care providers may not recommend any screening for nonpregnant women who are considered low risk for pelvic cancer and who do not have symptoms. Ask your health care provider if a screening pelvic exam is right for you.  If you have had past treatment for cervical cancer or a condition that could lead to cancer, you need Pap tests and screening for cancer for at least 20 years after your treatment. If Pap tests have been discontinued, your risk factors (such as having a new sexual partner) need to be reassessed to determine if screening should resume. Some women have medical problems that increase the chance  of getting cervical cancer. In these cases, your health care provider may recommend more frequent screening and Pap tests.  Colorectal Cancer  This type of cancer can be detected and often prevented.  Routine colorectal cancer screening usually begins at 61 years of age and continues through 61 years of age.  Your health care provider may recommend screening at an earlier age if you have risk factors for colon cancer.  Your health care provider may also recommend using home test kits to check for hidden blood in the stool.  A small camera at the end of a tube can be used to examine your colon directly (sigmoidoscopy or colonoscopy). This is done to check for the earliest forms of colorectal cancer.  Routine screening usually begins at age 34.  Direct examination of the colon should be repeated every 5-10 years through 61 years of age. However, you may need to be screened more often if early forms of precancerous polyps or small growths are found.  Skin Cancer  Check your skin  from head to toe regularly.  Tell your health care provider about any new moles or changes in moles, especially if there is a change in a mole's shape or color.  Also tell your health care provider if you have a mole that is larger than the size of a pencil eraser.  Always use sunscreen. Apply sunscreen liberally and repeatedly throughout the day.  Protect yourself by wearing long sleeves, pants, a wide-brimmed hat, and sunglasses whenever you are outside.  Heart disease, diabetes, and high blood pressure  High blood pressure causes heart disease and increases the risk of stroke. High blood pressure is more likely to develop in: ? People who have blood pressure in the high end of the normal range (130-139/85-89 mm Hg). ? People who are overweight or obese. ? People who are African American.  If you are 50-90 years of age, have your blood pressure checked every 3-5 years. If you are 67 years of age or older, have your blood pressure checked every year. You should have your blood pressure measured twice-once when you are at a hospital or clinic, and once when you are not at a hospital or clinic. Record the average of the two measurements. To check your blood pressure when you are not at a hospital or clinic, you can use: ? An automated blood pressure machine at a pharmacy. ? A home blood pressure monitor.  If you are between 67 years and 39 years old, ask your health care provider if you should take aspirin to prevent strokes.  Have regular diabetes screenings. This involves taking a blood sample to check your fasting blood sugar level. ? If you are at a normal weight and have a low risk for diabetes, have this test once every three years after 61 years of age. ? If you are overweight and have a high risk for diabetes, consider being tested at a younger age or more often. Preventing infection Hepatitis B  If you have a higher risk for hepatitis B, you should be screened for this virus. You  are considered at high risk for hepatitis B if: ? You were born in a country where hepatitis B is common. Ask your health care provider which countries are considered high risk. ? Your parents were born in a high-risk country, and you have not been immunized against hepatitis B (hepatitis B vaccine). ? You have HIV or AIDS. ? You use needles to inject street drugs. ?  You live with someone who has hepatitis B. ? You have had sex with someone who has hepatitis B. ? You get hemodialysis treatment. ? You take certain medicines for conditions, including cancer, organ transplantation, and autoimmune conditions.  Hepatitis C  Blood testing is recommended for: ? Everyone born from [birth year] through 1964. ? Anyone with known risk factors for hepatitis C.  Sexually transmitted infections (STIs)  You should be screened for sexually transmitted infections (STIs) including gonorrhea and chlamydia if: ? You are sexually active and are younger than 61 years of age. ? You are older than 61 years of age and your health care provider tells you that you are at risk for this type of infection. ? Your sexual activity has changed since you were last screened and you are at an increased risk for chlamydia or gonorrhea. Ask your health care provider if you are at risk.  If you do not have HIV, but are at risk, it may be recommended that you take a prescription medicine daily to prevent HIV infection. This is called pre-exposure prophylaxis (PrEP). You are considered at risk if: ? You are sexually active and do not regularly use condoms or know the HIV status of your partner(s). ? You take drugs by injection. ? You are sexually active with a partner who has HIV.  Talk with your health care provider about whether you are at high risk of being infected with HIV. If you choose to begin PrEP, you should first be tested for HIV. You should then be tested every 3 months for as long as you are taking PrEP. Pregnancy  If  you are premenopausal and you may become pregnant, ask your health care provider about preconception counseling.  If you may become pregnant, take 400 to 800 micrograms (mcg) of folic acid every day.  If you want to prevent pregnancy, talk to your health care provider about birth control (contraception). Osteoporosis and menopause  Osteoporosis is a disease in which the bones lose minerals and strength with aging. This can result in serious bone fractures. Your risk for osteoporosis can be identified using a bone density scan.  If you are 39 years of age or older, or if you are at risk for osteoporosis and fractures, ask your health care provider if you should be screened.  Ask your health care provider whether you should take a calcium or vitamin D supplement to lower your risk for osteoporosis.  Menopause may have certain physical symptoms and risks.  Hormone replacement therapy may reduce some of these symptoms and risks. Talk to your health care provider about whether hormone replacement therapy is right for you. Follow these instructions at home:  Schedule regular health, dental, and eye exams.  Stay current with your immunizations.  Do not use any tobacco products including cigarettes, chewing tobacco, or electronic cigarettes.  If you are pregnant, do not drink alcohol.  If you are breastfeeding, limit how much and how often you drink alcohol.  Limit alcohol intake to no more than 1 drink per day for nonpregnant women. One drink equals 12 ounces of beer, 5 ounces of wine, or 1 ounces of hard liquor.  Do not use street drugs.  Do not share needles.  Ask your health care provider for help if you need support or information about quitting drugs.  Tell your health care provider if you often feel depressed.  Tell your health care provider if you have ever been abused or do not feel safe at  home. This information is not intended to replace advice given to you by your health  care provider. Make sure you discuss any questions you have with your health care provider. Document Released: 01/10/2011 Document Revised: 12/03/2015 Document Reviewed: 03/31/2015 Elsevier Interactive Patient Education  [employer].

## 2018-02-14 NOTE — Progress Notes (Signed)
[name] [name]  MRN: [id number] DOB: [birth year]  PCP: Mancel Bale, PA-C   Chief Complaint  Patient presents with  . Annual Exam    Subjective:  Pt presents to clinic for a CPE.  Last dental exam: every 6 months Last vision exam: Dr Girtha Rm - within next 2 weeks - she wears glasses Last pap: plans to schedule Last mammo: 04/2017 Last colonoscopy: last year - schedule in 03/2018 Vaccinations - UTD  She has some concerns that she would like to address today 1.  Cervical neck - pain - xray showed degenerative and spinal stenosis she is much better since her dose of prednisone she continues to have pain across her upper shoulders feels tight and sometimes has paresthesias in her right hand. - 2.  Intermittent swelling bilateral feet but worse in the left.  Noticed it is been worse this summer.  Resolved in the morning when she wakes up.  Feels tight and tender. 3.  Sleep issues -  trouble staying asleep - awaken by bad dreams - wakes every 2 hours - she has been on ambien in the past -- she has a lot of stress - she has tried benadryl and melatonin but it does not work - trazodone makes her dizzy when she gets to a dose that helps her sleep  Typical meals for patient: 3 meals Breakfast - granola bar Lunch - sandwich or salad -  Dinner - meat with veggies - homecooked Typical beverage choices: coffee or hot tea, water - wine or beer at night rarely Exercises: not often   Patient Active Problem List   Diagnosis Date Noted  . Constipation due to outlet dysfunction   . Status post bilateral total hip replacement 05/27/2014  . ANXIETY DISORDER- mixed disorder w/ depressive symptoms 02/07/2014  . Knee pain   . PERIPHERAL NEUROPATHY 10/13/2007  . HYPERTENSION 10/13/2007  . HERNIA, UMBILICAL 83/38/2505  . COLONIC POLYPS, ADENOMATOUS 02/20/2003  . DIVERTICULOSIS, COLON 02/20/2003  . ADENOCARCINOMA, COLON, SIGMOID, HX OF 02/20/2003    Patient Care Team: Mittie Bodo as PCP  - General (Physician Assistant) Marti Sleigh, MD (Obstetrics and Gynecology) Murinson, Haynes Bast, MD (Hematology and Oncology) Jacolyn Reedy, MD as Consulting Physician (Cardiology) Melrose Nakayama, MD as Consulting Physician (Orthopedic Surgery) Mauri Pole, MD as Consulting Physician (Gastroenterology) Aloha Gell, MD as Consulting Physician (Obstetrics and Gynecology)  Review of Systems  Constitutional: Positive for fatigue.  Cardiovascular: Positive for leg swelling.  Gastrointestinal: Positive for abdominal pain (Occurs with constipation), constipation, diarrhea and nausea.       Sees GI for these concerns   Genitourinary: Positive for frequency.  Musculoskeletal: Positive for joint swelling (Lower extremities) and neck pain.  Neurological: Positive for numbness (Intermittent in her right hand) and headaches (Her neck and shoulder muscle area).  Psychiatric/Behavioral: Positive for dysphoric mood (Dealing with a lot of stress) and sleep disturbance.     Current Outpatient Medications on File Prior to Visit  Medication Sig Dispense Refill  . Artificial Tear Ointment (DRY EYES OP) Apply 1 drop to eye every morning.    . linaclotide (LINZESS) 72 MCG capsule Take 1 capsule (72 mcg total) by mouth daily before breakfast. 30 capsule 3  . lisinopril-hydrochlorothiazide (PRINZIDE,ZESTORETIC) 20-12.5 MG per tablet Take 2 tablets by mouth daily.    . pravastatin (PRAVACHOL) 80 MG tablet Take 80 mg by mouth daily.     Current Facility-Administered Medications on File Prior to Visit  Medication Dose Route  Frequency Provider Last Rate Last Dose  . 0.9 %  sodium chloride infusion  500 mL Intravenous Continuous Nandigam, Venia Minks, MD        No Known Allergies  Social History   Socioeconomic History  . Marital status: Married    Spouse name: Nicole Kindred  . Number of children: 1  . Years of education: Not on file  . Highest education level: Not on file  Occupational  History  . Occupation: office work  Scientific laboratory technician  . Financial resource strain: Not on file  . Food insecurity:    Worry: Not on file    Inability: Not on file  . Transportation needs:    Medical: Not on file    Non-medical: Not on file  Tobacco Use  . Smoking status: Former Smoker    Types: Cigarettes  . Smokeless tobacco: Never Used  . Tobacco comment: "smoked some socially in my college days"  Substance and Sexual Activity  . Alcohol use: Yes    Comment: occasionally beer/wine  . Drug use: No  . Sexual activity: Yes    Partners: Male    Birth control/protection: None  Lifestyle  . Physical activity:    Days per week: Not on file    Minutes per session: Not on file  . Stress: Not on file  Relationships  . Social connections:    Talks on phone: Not on file    Gets together: Not on file    Attends religious service: Not on file    Active member of club or organization: Not on file    Attends meetings of clubs or organizations: Not on file    Relationship status: Not on file  Other Topics Concern  . Not on file  Social History Narrative   Lives with husband   Exercise walking 2 x weekly   Patient's family history -  UNKNOWN ADOPTED    Past Surgical History:  Procedure Laterality Date  . ANAL RECTAL MANOMETRY N/A 07/17/2017   Procedure: ANO RECTAL MANOMETRY;  Surgeon: Mauri Pole, MD;  Location: [facility];  Service: Endoscopy;  Laterality: N/A;  . COLECTOMY  Aug 2004   S/P Low Anterior Resxn (Sigmoid Colon)  . COLONOSCOPY    . DILATION AND CURETTAGE OF UTERUS  1991  . LUMBAR DISC SURGERY  Feb 2007  . TOTAL HIP ARTHROPLASTY Right 05/27/2014   Procedure: RIGHT TOTAL HIP ARTHROPLASTY ANTERIOR APPROACH;  Surgeon: Hessie Dibble, MD;  Location: [facility];  Service: Orthopedics;  Laterality: Right;  . TOTAL HIP ARTHROPLASTY Left 08/25/2015   Procedure: TOTAL HIP ARTHROPLASTY ANTERIOR APPROACH;  Surgeon: Melrose Nakayama, MD;  Location: [facility];  Service: Orthopedics;   Laterality: Left;    Family History  Adopted: Yes  Problem Relation Age of Onset  . Colon cancer Neg Hx      Objective:  BP 122/68   Pulse 97   Temp 98.3 F (36.8 C)   Resp 16   Ht 5' 2.21" (1.58 m)   Wt 162 lb 12.8 oz (73.8 kg)   SpO2 98%   BMI 29.58 kg/m   Physical Exam  Constitutional: She is oriented to person, place, and time. She appears well-developed and well-nourished.  HENT:  Head: Normocephalic and atraumatic.  Right Ear: Hearing, tympanic membrane, external ear and ear canal normal.  Left Ear: Hearing, tympanic membrane, external ear and ear canal normal.  Nose: Nose normal.  Mouth/Throat: Uvula is midline, oropharynx is clear and moist and mucous membranes are  normal.  Eyes: Pupils are equal, round, and reactive to light. Conjunctivae, EOM and lids are normal. Right eye exhibits no discharge. Left eye exhibits no discharge.  Neck: Trachea normal and normal range of motion. Neck supple. No thyroid mass and no thyromegaly present.  Cardiovascular: Normal rate, regular rhythm and normal heart sounds.  No murmur heard. Pulmonary/Chest: Effort normal and breath sounds normal. She has no wheezes.  Abdominal: Soft. Normal appearance and bowel sounds are normal. There is no tenderness.  Musculoskeletal: Normal range of motion.       Right ankle: She exhibits swelling (Ankle swelling without pitting edema).       Left ankle: She exhibits swelling (1+ pitting edema approximately one quarter of the distal shin).       Cervical back: She exhibits spasm (Bilateral trapezius, tenderness to palpation). She exhibits normal range of motion and no tenderness.  Lymphadenopathy:       Head (right side): No tonsillar, no preauricular, no posterior auricular and no occipital adenopathy present.       Head (left side): No tonsillar, no preauricular, no posterior auricular and no occipital adenopathy present.    She has no cervical adenopathy.       Right: No supraclavicular  adenopathy present.       Left: No supraclavicular adenopathy present.  Neurological: She is alert and oriented to person, place, and time. She has normal strength and normal reflexes.  Skin: Skin is warm, dry and intact.  Psychiatric: She has a normal mood and affect. Her speech is normal and behavior is normal. Judgment and thought content normal.  Vitals reviewed.   Wt Readings from Last 3 Encounters:  02/14/18 162 lb 12.8 oz (73.8 kg)  10/09/17 159 lb 3.2 oz (72.2 kg)  06/06/17 160 lb 9.6 oz (72.8 kg)     Visual Acuity Screening   Right eye Left eye Both eyes  Without correction: 20/50 20/50 20/50   With correction:     Patient typically wears glasses but does not wear them for exam today  Assessment and Plan :  Annual physical exam -anticipatory guidance, reviewed health maintenance  Sleep disturbance - Plan: amitriptyline (ELAVIL) 10 MG tablet -trial of Elavil titrating up to 50 mg at night based on results and side effects discussed with patient we can go higher if she needs it  DDD (degenerative disc disease), cervical - Plan: cyclobenzaprine (FLEXERIL) 5 MG tablet -patient has an appointment with orthopedist will try Flexeril before her appointment to see if that helps with muscle tenderness and daily headache resulting from this  Essential hypertension - Plan: CMP14+EGFR, TSH -well-controlled on medications, sees cardiologist for these medications as an appointment scheduled  Screening for deficiency anemia - Plan: CBC with Differential/Platelet  Elevated cholesterol- Plan: Lipid panel -sees cardiologist- patient will continue medications and has cardiology appointment scheduled  Screening for diabetes mellitus - Plan: Hemoglobin A1c -check labs  Arthritis - Plan: meloxicam (MOBIC) 15 MG tablet -continue medication as needed  Lower extremity edema -decrease salt, encouraged weight loss, encourage weightbearing activity and elevation of legs during the day, encouraged use  of medium compression support socks to decrease edema.  Patient is already on diuretic may need to increase this but patient will try both first.  Windell Hummingbird PA-C  [facility] 02/14/2018 3:33 PM  Please note: Portions of this report may have been transcribed using dragon voice recognition software. Every effort was made to ensure accuracy; however, inadvertent computerized  transcription errors may be present.

## 2018-02-15 LAB — CBC WITH DIFFERENTIAL/PLATELET
BASOS: 0 %
Basophils Absolute: 0 10*3/uL (ref 0.0–0.2)
EOS (ABSOLUTE): 0 10*3/uL (ref 0.0–0.4)
EOS: 1 %
HEMATOCRIT: 41.1 % (ref 34.0–46.6)
Hemoglobin: 14 g/dL (ref 11.1–15.9)
Immature Grans (Abs): 0 10*3/uL (ref 0.0–0.1)
Immature Granulocytes: 0 %
LYMPHS ABS: 1.6 10*3/uL (ref 0.7–3.1)
Lymphs: 27 %
MCH: 30.8 pg (ref 26.6–33.0)
MCHC: 34.1 g/dL (ref 31.5–35.7)
MCV: 90 fL (ref 79–97)
Monocytes Absolute: 0.5 10*3/uL (ref 0.1–0.9)
Monocytes: 9 %
NEUTROS ABS: 3.7 10*3/uL (ref 1.4–7.0)
Neutrophils: 63 %
Platelets: 181 10*3/uL (ref 150–450)
RBC: 4.55 x10E6/uL (ref 3.77–5.28)
RDW: 12.9 % (ref 12.3–15.4)
WBC: 5.8 10*3/uL (ref 3.4–10.8)

## 2018-02-15 LAB — CMP14+EGFR
A/G RATIO: 1.9 (ref 1.2–2.2)
ALK PHOS: 56 IU/L (ref 39–117)
ALT: 32 IU/L (ref 0–32)
AST: 29 IU/L (ref 0–40)
Albumin: 4.5 g/dL (ref 3.6–4.8)
BUN/Creatinine Ratio: 12 (ref 12–28)
BUN: 7 mg/dL — ABNORMAL LOW (ref 8–27)
Bilirubin Total: 0.5 mg/dL (ref 0.0–1.2)
CALCIUM: 9.4 mg/dL (ref 8.7–10.3)
CO2: 22 mmol/L (ref 20–29)
Chloride: 98 mmol/L (ref 96–106)
Creatinine, Ser: 0.59 mg/dL (ref 0.57–1.00)
GFR calc Af Amer: 114 mL/min/{1.73_m2} (ref >59)
GFR calc non Af Amer: 99 mL/min/{1.73_m2} (ref >59)
GLOBULIN, TOTAL: 2.4 g/dL (ref 1.5–4.5)
Glucose: 124 mg/dL — ABNORMAL HIGH (ref 65–99)
POTASSIUM: 4.1 mmol/L (ref 3.5–5.2)
SODIUM: 137 mmol/L (ref 134–144)
Total Protein: 6.9 g/dL (ref 6.0–8.5)

## 2018-02-15 LAB — TSH: TSH: 0.931 u[IU]/mL (ref 0.450–4.500)

## 2018-02-15 LAB — LIPID PANEL
Chol/HDL Ratio: 2.4 ratio (ref 0.0–4.4)
Cholesterol, Total: 167 mg/dL (ref 100–199)
HDL: 71 mg/dL (ref >39)
LDL CALC: 75 mg/dL (ref 0–99)
Triglycerides: 104 mg/dL (ref 0–149)
VLDL CHOLESTEROL CAL: 21 mg/dL (ref 5–40)

## 2018-02-15 LAB — HEMOGLOBIN A1C
Est. average glucose Bld gHb Est-mCnc: 134 mg/dL
Hgb A1c MFr Bld: 6.3 % — ABNORMAL HIGH (ref 4.8–5.6)

## 2018-02-16 DIAGNOSIS — H1013 Acute atopic conjunctivitis, bilateral: Secondary | ICD-10-CM | POA: Diagnosis not present

## 2018-02-26 DIAGNOSIS — I351 Nonrheumatic aortic (valve) insufficiency: Secondary | ICD-10-CM | POA: Diagnosis not present

## 2018-02-26 DIAGNOSIS — I359 Nonrheumatic aortic valve disorder, unspecified: Secondary | ICD-10-CM | POA: Diagnosis not present

## 2018-02-26 DIAGNOSIS — I119 Hypertensive heart disease without heart failure: Secondary | ICD-10-CM | POA: Diagnosis not present

## 2018-02-26 DIAGNOSIS — E785 Hyperlipidemia, unspecified: Secondary | ICD-10-CM | POA: Diagnosis not present

## 2018-03-07 DIAGNOSIS — M25552 Pain in left hip: Secondary | ICD-10-CM | POA: Diagnosis not present

## 2018-03-07 DIAGNOSIS — M25551 Pain in right hip: Secondary | ICD-10-CM | POA: Diagnosis not present

## 2018-03-15 ENCOUNTER — Other Ambulatory Visit: Payer: Self-pay

## 2018-03-15 ENCOUNTER — Ambulatory Visit (AMBULATORY_SURGERY_CENTER): Payer: Self-pay | Admitting: *Deleted

## 2018-03-15 VITALS — Ht 63.0 in | Wt 165.8 lb

## 2018-03-15 DIAGNOSIS — Z85038 Personal history of other malignant neoplasm of large intestine: Secondary | ICD-10-CM

## 2018-03-15 DIAGNOSIS — Z86010 Personal history of colonic polyps: Secondary | ICD-10-CM

## 2018-03-15 NOTE — Progress Notes (Signed)
No egg or soy allergy known to patient  No issues with past sedation with any surgeries  or procedures, no intubation problems  No diet pills per patient No home 02 use per patient  No blood thinners per patient  Pt  Has  issues with constipation, uses Linzess , Dr Woodward Ku prep orderd. Patient will continue her Linzess and call if prep is not working No A fib or A flutter

## 2018-03-16 ENCOUNTER — Encounter: Payer: Self-pay | Admitting: Gastroenterology

## 2018-03-19 DIAGNOSIS — M5412 Radiculopathy, cervical region: Secondary | ICD-10-CM | POA: Diagnosis not present

## 2018-03-21 DIAGNOSIS — M7602 Gluteal tendinitis, left hip: Secondary | ICD-10-CM | POA: Diagnosis not present

## 2018-03-21 DIAGNOSIS — M5090 Cervical disc disorder, unspecified, unspecified cervical region: Secondary | ICD-10-CM | POA: Diagnosis not present

## 2018-03-21 DIAGNOSIS — M542 Cervicalgia: Secondary | ICD-10-CM | POA: Diagnosis not present

## 2018-03-21 DIAGNOSIS — M7601 Gluteal tendinitis, right hip: Secondary | ICD-10-CM | POA: Diagnosis not present

## 2018-03-21 DIAGNOSIS — M4326 Fusion of spine, lumbar region: Secondary | ICD-10-CM | POA: Diagnosis not present

## 2018-03-22 DIAGNOSIS — M7602 Gluteal tendinitis, left hip: Secondary | ICD-10-CM | POA: Diagnosis not present

## 2018-03-22 DIAGNOSIS — M5090 Cervical disc disorder, unspecified, unspecified cervical region: Secondary | ICD-10-CM | POA: Diagnosis not present

## 2018-03-22 DIAGNOSIS — M4326 Fusion of spine, lumbar region: Secondary | ICD-10-CM | POA: Diagnosis not present

## 2018-03-22 DIAGNOSIS — M7601 Gluteal tendinitis, right hip: Secondary | ICD-10-CM | POA: Diagnosis not present

## 2018-03-22 DIAGNOSIS — M542 Cervicalgia: Secondary | ICD-10-CM | POA: Diagnosis not present

## 2018-03-27 DIAGNOSIS — M5090 Cervical disc disorder, unspecified, unspecified cervical region: Secondary | ICD-10-CM | POA: Diagnosis not present

## 2018-03-27 DIAGNOSIS — M542 Cervicalgia: Secondary | ICD-10-CM | POA: Diagnosis not present

## 2018-03-29 ENCOUNTER — Encounter: Payer: Self-pay | Admitting: Gastroenterology

## 2018-03-29 ENCOUNTER — Ambulatory Visit (AMBULATORY_SURGERY_CENTER): Payer: 59 | Admitting: Gastroenterology

## 2018-03-29 VITALS — BP 127/60 | HR 72 | Temp 96.0°F | Resp 14 | Ht 63.0 in | Wt 165.0 lb

## 2018-03-29 DIAGNOSIS — Z86010 Personal history of colonic polyps: Secondary | ICD-10-CM | POA: Diagnosis not present

## 2018-03-29 DIAGNOSIS — D123 Benign neoplasm of transverse colon: Secondary | ICD-10-CM | POA: Diagnosis not present

## 2018-03-29 DIAGNOSIS — D124 Benign neoplasm of descending colon: Secondary | ICD-10-CM

## 2018-03-29 DIAGNOSIS — Z1211 Encounter for screening for malignant neoplasm of colon: Secondary | ICD-10-CM | POA: Diagnosis not present

## 2018-03-29 MED ORDER — SODIUM CHLORIDE 0.9 % IV SOLN: 500.0000 mL | Freq: Once | INTRAVENOUS | Status: DC

## 2018-03-29 NOTE — Op Note (Signed)
[facility] Patient Name: [name] [name] Procedure Date: 03/29/2018 7:57 AM MRN: [id number] Endoscopist: Mauri Pole , MD Age: 62 Referring MD:  Date of Birth: [birth year] Gender: Female Account #: [id number] Procedure:                Colonoscopy Indications:              Surveillance: Piecemeal removal of large sessile                            adenoma last colonoscopy (< 3 yrs), Surveillance:                            High risk for colon cancer and History of                            adenomatous polyps, last colonoscopy (<3 yr), High                            risk colon cancer surveillance: Personal history of                            colon cancer Medicines:                Monitored Anesthesia Care Procedure:                Pre-Anesthesia Assessment:                           - Prior to the procedure, a History and Physical                            was performed, and patient medications and                            allergies were reviewed. The patient's tolerance of                            previous anesthesia was also reviewed. The risks                            and benefits of the procedure and the sedation                            options and risks were discussed with the patient.                            All questions were answered, and informed consent                            was obtained. Prior Anticoagulants: The patient has                            taken no previous anticoagulant or antiplatelet  agents. ASA Grade Assessment: II - A patient with                            mild systemic disease. After reviewing the risks                            and benefits, the patient was deemed in                            satisfactory condition to undergo the procedure.                           After obtaining informed consent, the colonoscope                            was passed under direct vision. Throughout the                           procedure, the patient's blood pressure, pulse, and                            oxygen saturations were monitored continuously. The                            Colonoscope was introduced through the anus and                            advanced to the the cecum, identified by                            appendiceal orifice and ileocecal valve. The                            colonoscopy was performed without difficulty. The                            patient tolerated the procedure well. The quality                            of the bowel preparation was adequate. The                            ileocecal valve, appendiceal orifice, and rectum                            were photographed. Scope In: 8:11:12 AM Scope Out: 8:31:35 AM Scope Withdrawal Time: 0 hours 18 minutes 3 seconds  Total Procedure Duration: 0 hours 20 minutes 23 seconds  Findings:                 The perianal and digital rectal examinations were                            normal.  Three sessile polyps were found in the descending                            colon and transverse colon. The polyps were 5 to 11                            mm in size. These polyps were removed with a cold                            snare. Resection and retrieval were complete.                           Two sessile polyps were found in the descending                            colon and transverse colon. The polyps were 2 to 3                            mm in size. These polyps were removed with a cold                            biopsy forceps. Resection and retrieval were                            complete.                           A few small and large-mouthed diverticula were                            found in the descending colon.                           There was evidence of a prior functional end-to-end                            colo-colonic anastomosis in the recto-sigmoid                             colon. This was patent and was characterized by                            healthy appearing mucosa. The anastomosis was                            traversed.                           Non-bleeding internal hemorrhoids were found during                            retroflexion. The hemorrhoids were small. Complications:            No immediate complications. Estimated Blood Loss:     Estimated blood  loss was minimal. Estimated blood                            loss was minimal. Impression:               - Three 5 to 11 mm polyps in the descending colon                            and in the transverse colon, removed with a cold                            snare. Resected and retrieved.                           - Two 2 to 3 mm polyps in the descending colon and                            in the transverse colon, removed with a cold biopsy                            forceps. Resected and retrieved.                           - Diverticulosis in the descending colon.                           - Patent functional end-to-end colo-colonic                            anastomosis, characterized by healthy appearing                            mucosa. Recommendation:           - Patient has a contact number available for                            emergencies. The signs and symptoms of potential                            delayed complications were discussed with the                            patient. Return to normal activities tomorrow.                            Written discharge instructions were provided to the                            patient.                           - Resume previous diet.                           - Continue present medications.                           -  Use Linzess (linaclotide) 72 mcg PO daily for 1                            year.                           - Use Benefiber one teaspoon PO TID daily.                           - Return to my office in 3  months.                           - Repeat colonoscopy in 1 year for surveillance                            based on pathology results.                           - Refer to a genetics counselor at the next                            available appointment. Mauri Pole, MD 03/29/2018 8:41:13 AM This report has been signed electronically.

## 2018-03-29 NOTE — Progress Notes (Signed)
Called to room to assist during endoscopic procedure.  Patient ID and intended procedure confirmed with present staff. Received instructions for my participation in the procedure from the performing physician.  

## 2018-03-29 NOTE — Patient Instructions (Signed)
Information on polyps,diverticulosis, & hemorrhoids given to you today  Use Linzess 72 mcg by mouth daily for one year ( already have this medication & 3 refills were already ordered  Use Benefiber ( over the counter) one tsp by mouth three times a day   Return to Dr Silverio Decamp in 3 months for office visit  Repeat Colonoscopy in one year based on pathology results  Referral will be made to [facility]:   Refer to the procedure report that was given to you for any specific questions about what was found during the examination.  If the procedure report does not answer your questions, please call your gastroenterologist to clarify.  If you requested that your care partner not be given the details of your procedure findings, then the procedure report has been included in a sealed envelope for you to review at your convenience later.  YOU SHOULD EXPECT: Some feelings of bloating in the abdomen. Passage of more gas than usual.  Walking can help get rid of the air that was put into your GI tract during the procedure and reduce the bloating. If you had a lower endoscopy (such as a colonoscopy or flexible sigmoidoscopy) you may notice spotting of blood in your stool or on the toilet paper. If you underwent a bowel prep for your procedure, you may not have a normal bowel movement for a few days.  Please Note:  You might notice some irritation and congestion in your nose or some drainage.  This is from the oxygen used during your procedure.  There is no need for concern and it should clear up in a day or so.  SYMPTOMS TO REPORT IMMEDIATELY:   Following lower endoscopy (colonoscopy or flexible sigmoidoscopy):  Excessive amounts of blood in the stool  Significant tenderness or worsening of abdominal pains  Swelling of the abdomen that is new, acute  Fever of 100F or higher    For urgent or emergent issues, a  gastroenterologist can be reached at any hour by calling [phone].   DIET:  We do recommend a small meal at first, but then you may proceed to your regular diet.  Drink plenty of fluids but you should avoid alcoholic beverages for 24 hours.  ACTIVITY:  You should plan to take it easy for the rest of today and you should NOT DRIVE or use heavy machinery until tomorrow (because of the sedation medicines used during the test).    FOLLOW UP: Our staff will call the number listed on your records the next business day following your procedure to check on you and address any questions or concerns that you may have regarding the information given to you following your procedure. If we do not reach you, we will leave a message.  However, if you are feeling well and you are not experiencing any problems, there is no need to return our call.  We will assume that you have returned to your regular daily activities without incident.  If any biopsies were taken you will be contacted by phone or by letter within the next 1-3 weeks.  Please call us at [phone] if you have not heard about the biopsies in 3 weeks.    SIGNATURES/CONFIDENTIALITY: You and/or your care partner have signed paperwork which will be entered into your electronic medical record.  These signatures attest to the fact that that the information above on your  After Visit Summary has been reviewed and is understood.  Full responsibility of the confidentiality of this discharge information lies with you and/or your care-partner.

## 2018-03-29 NOTE — Progress Notes (Signed)
Pt's states no medical or surgical changes since previsit or office visit. 

## 2018-03-29 NOTE — Progress Notes (Signed)
To PACU, VSS. Report to [email or web address] 

## 2018-03-30 ENCOUNTER — Telehealth: Payer: Self-pay

## 2018-03-30 NOTE — Telephone Encounter (Signed)
First post procedure follow up call, left message. °

## 2018-03-30 NOTE — Telephone Encounter (Signed)
Second post procedure phone call, no answer 

## 2018-04-03 DIAGNOSIS — M542 Cervicalgia: Secondary | ICD-10-CM | POA: Diagnosis not present

## 2018-04-04 ENCOUNTER — Other Ambulatory Visit: Payer: Self-pay

## 2018-04-04 DIAGNOSIS — Z86010 Personal history of colonic polyps: Secondary | ICD-10-CM

## 2018-04-04 DIAGNOSIS — Z85038 Personal history of other malignant neoplasm of large intestine: Secondary | ICD-10-CM

## 2018-04-10 DIAGNOSIS — M542 Cervicalgia: Secondary | ICD-10-CM | POA: Diagnosis not present

## 2018-04-11 ENCOUNTER — Encounter: Payer: Self-pay | Admitting: Gastroenterology

## 2018-04-12 ENCOUNTER — Telehealth: Payer: Self-pay | Admitting: Gastroenterology

## 2018-04-12 MED ORDER — LINACLOTIDE 72 MCG PO CAPS: 72.0000 ug | ORAL_CAPSULE | Freq: Every day | ORAL | 3 refills | Status: DC

## 2018-04-12 NOTE — Telephone Encounter (Signed)
Linzess sent to [facility] pharmacy as requested by patient

## 2018-04-12 NOTE — Telephone Encounter (Signed)
Patient requesting refill of medication linzess sent to [facility] pharmacy.

## 2018-04-24 DIAGNOSIS — M542 Cervicalgia: Secondary | ICD-10-CM | POA: Diagnosis not present

## 2018-04-24 DIAGNOSIS — M5090 Cervical disc disorder, unspecified, unspecified cervical region: Secondary | ICD-10-CM | POA: Diagnosis not present

## 2018-04-30 DIAGNOSIS — M542 Cervicalgia: Secondary | ICD-10-CM | POA: Diagnosis not present

## 2018-04-30 DIAGNOSIS — M5090 Cervical disc disorder, unspecified, unspecified cervical region: Secondary | ICD-10-CM | POA: Diagnosis not present

## 2018-05-02 DIAGNOSIS — M5090 Cervical disc disorder, unspecified, unspecified cervical region: Secondary | ICD-10-CM | POA: Diagnosis not present

## 2018-05-02 DIAGNOSIS — M542 Cervicalgia: Secondary | ICD-10-CM | POA: Diagnosis not present

## 2018-05-07 DIAGNOSIS — M542 Cervicalgia: Secondary | ICD-10-CM | POA: Diagnosis not present

## 2018-05-07 DIAGNOSIS — M5090 Cervical disc disorder, unspecified, unspecified cervical region: Secondary | ICD-10-CM | POA: Diagnosis not present

## 2018-05-14 DIAGNOSIS — M542 Cervicalgia: Secondary | ICD-10-CM | POA: Diagnosis not present

## 2018-05-14 DIAGNOSIS — M5090 Cervical disc disorder, unspecified, unspecified cervical region: Secondary | ICD-10-CM | POA: Diagnosis not present

## 2018-05-16 DIAGNOSIS — M5090 Cervical disc disorder, unspecified, unspecified cervical region: Secondary | ICD-10-CM | POA: Diagnosis not present

## 2018-05-16 DIAGNOSIS — M542 Cervicalgia: Secondary | ICD-10-CM | POA: Diagnosis not present

## 2018-05-23 DIAGNOSIS — M542 Cervicalgia: Secondary | ICD-10-CM | POA: Diagnosis not present

## 2018-05-23 DIAGNOSIS — M5090 Cervical disc disorder, unspecified, unspecified cervical region: Secondary | ICD-10-CM | POA: Diagnosis not present

## 2018-05-24 DIAGNOSIS — Z1231 Encounter for screening mammogram for malignant neoplasm of breast: Secondary | ICD-10-CM | POA: Diagnosis not present

## 2018-05-29 DIAGNOSIS — M542 Cervicalgia: Secondary | ICD-10-CM | POA: Diagnosis not present

## 2018-05-29 DIAGNOSIS — M5090 Cervical disc disorder, unspecified, unspecified cervical region: Secondary | ICD-10-CM | POA: Diagnosis not present

## 2018-06-11 DIAGNOSIS — M542 Cervicalgia: Secondary | ICD-10-CM | POA: Diagnosis not present

## 2018-06-11 DIAGNOSIS — M5090 Cervical disc disorder, unspecified, unspecified cervical region: Secondary | ICD-10-CM | POA: Diagnosis not present

## 2018-06-14 DIAGNOSIS — M542 Cervicalgia: Secondary | ICD-10-CM | POA: Diagnosis not present

## 2018-06-14 DIAGNOSIS — M5090 Cervical disc disorder, unspecified, unspecified cervical region: Secondary | ICD-10-CM | POA: Diagnosis not present

## 2018-06-18 DIAGNOSIS — M5090 Cervical disc disorder, unspecified, unspecified cervical region: Secondary | ICD-10-CM | POA: Diagnosis not present

## 2018-06-18 DIAGNOSIS — M542 Cervicalgia: Secondary | ICD-10-CM | POA: Diagnosis not present

## 2018-08-14 ENCOUNTER — Encounter: Payer: Self-pay | Admitting: Gastroenterology

## 2018-09-13 ENCOUNTER — Other Ambulatory Visit: Payer: Self-pay | Admitting: Gastroenterology

## 2018-09-21 IMAGING — DX DG CERVICAL SPINE COMPLETE 4+V
5 series · 5 of 5 positions shown · non-contrast
Comparison: None.

CLINICAL DATA: Neck pain with cervical radiculopathy.

EXAM:
CERVICAL SPINE - COMPLETE 4+ VIEW

[c-spine lat]
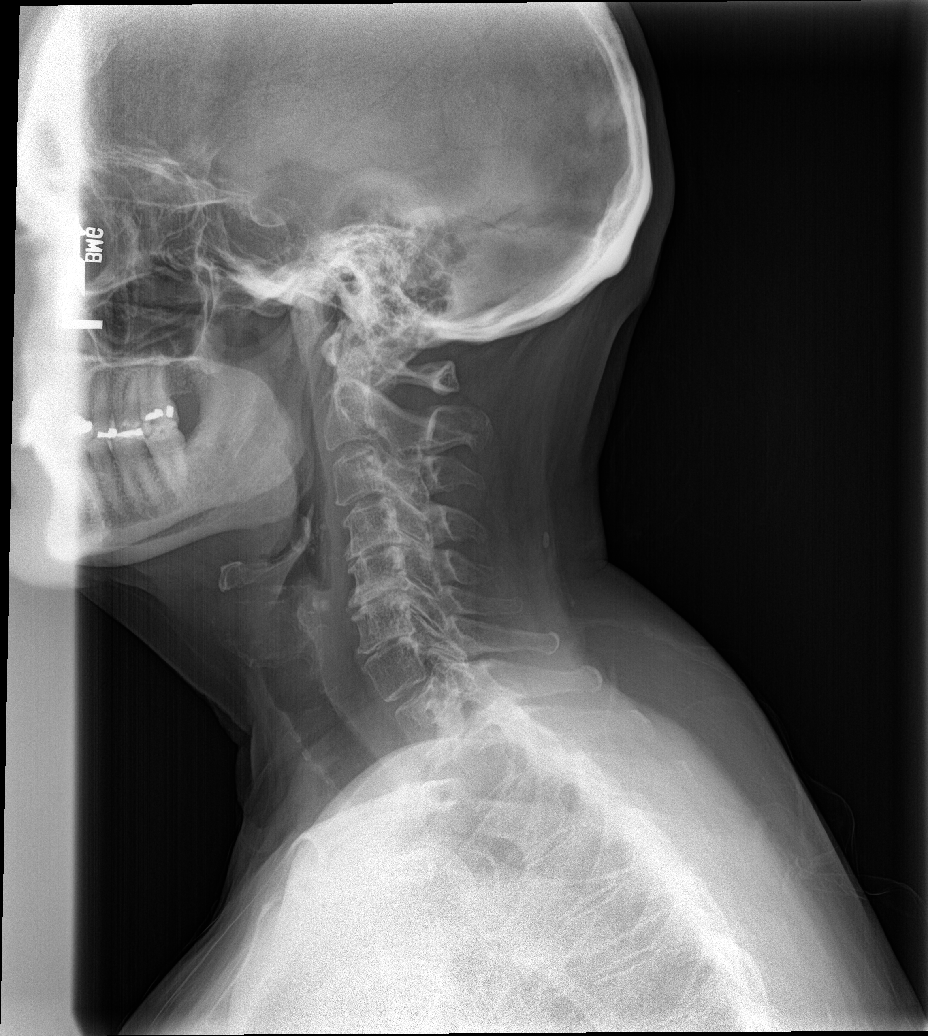

[c-spine obl (1 of 2)]
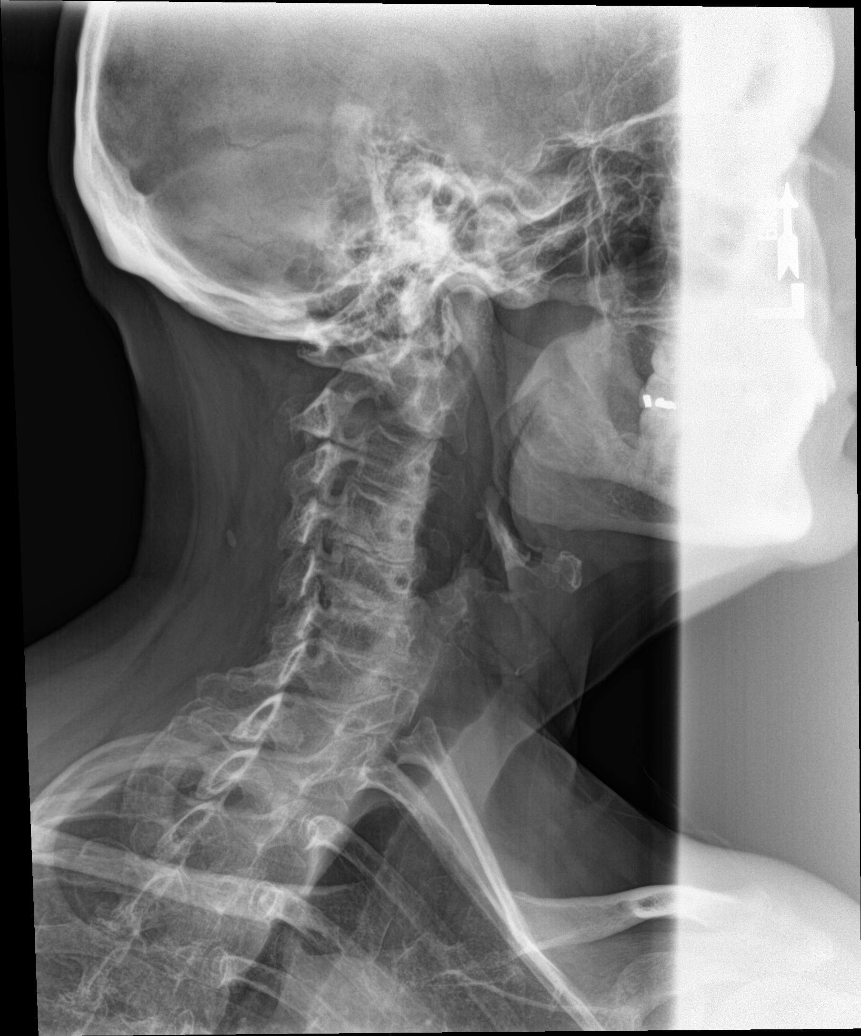

[c-spine obl (2 of 2)]
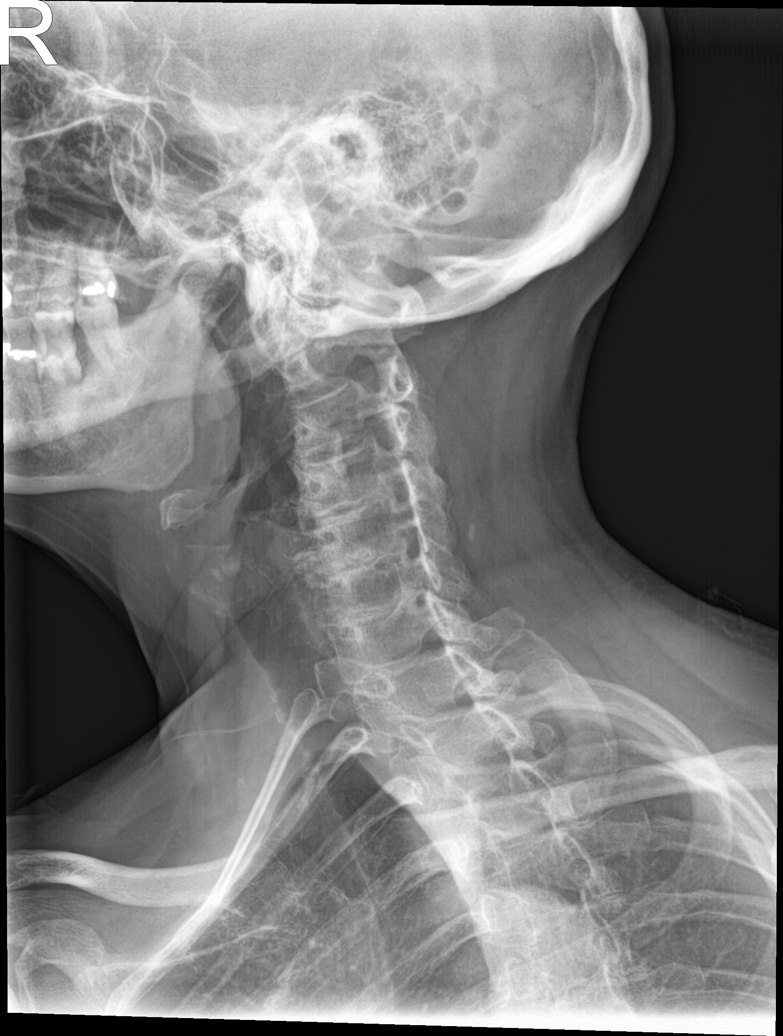

[c-spine ap]
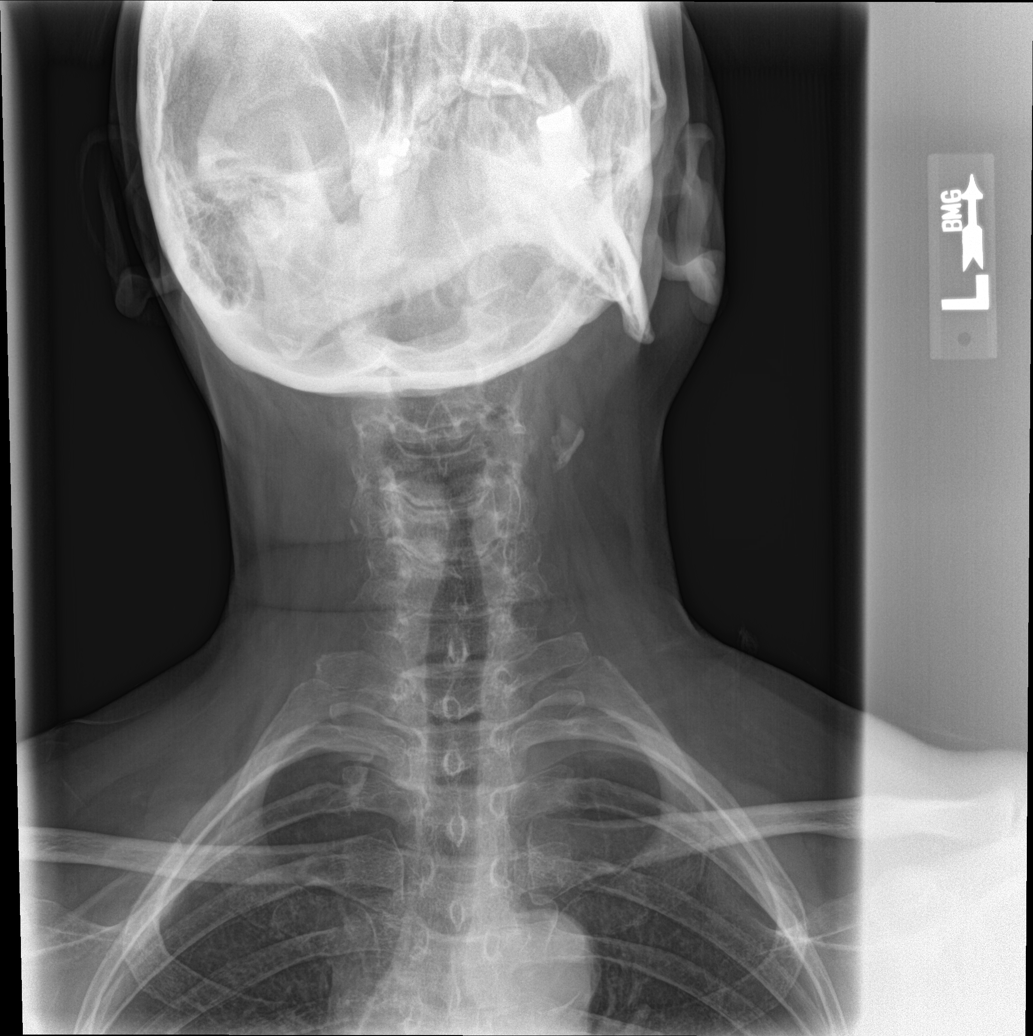

[c-spine open mouth]
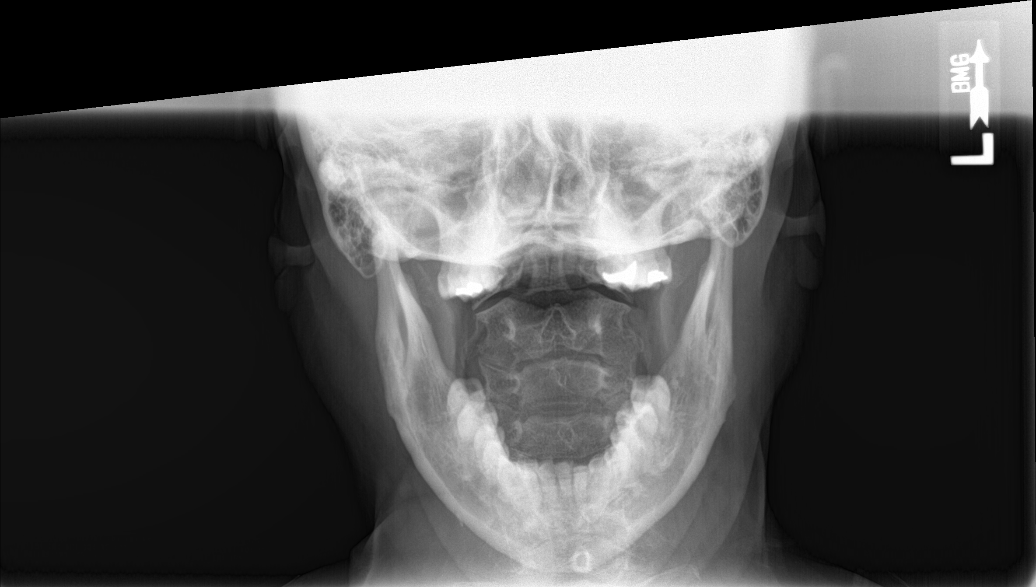

[5 of 5 positions shown; findings below may reference images not displayed]

FINDINGS: No fracture or significant spondylolisthesis is noted. Severe
degenerative disc disease is noted at C4-5, C5-6 and C6-7 with
anterior osteophyte formation. No prevertebral soft tissue swelling
is noted. Moderate bilateral neural foraminal stenosis is noted at
C4-5, C5-6 and C6-7 secondary to uncovertebral spurring.
IMPRESSION: Severe multilevel degenerative disc disease with moderate bilateral
neural foraminal stenosis secondary to uncovertebral spurring. No
acute abnormality is noted in the cervical spine.

## 2018-10-02 ENCOUNTER — Ambulatory Visit: Payer: 59 | Admitting: Nurse Practitioner

## 2018-10-18 ENCOUNTER — Other Ambulatory Visit: Payer: Self-pay

## 2018-10-18 MED ORDER — LINACLOTIDE 72 MCG PO CAPS: ORAL_CAPSULE | ORAL | 3 refills | Status: DC

## 2018-10-23 ENCOUNTER — Telehealth: Payer: Self-pay

## 2018-10-23 NOTE — Telephone Encounter (Signed)
I have sent a prior authorization request to CoverMyMeds for patients Linzess 72 mcg. For her constipation outlet dysfunction-K59.02, and her rectosigmoid cancer C19.

## 2018-10-26 NOTE — Telephone Encounter (Signed)
I checked [employer] and this is still pending.

## 2018-11-01 NOTE — Telephone Encounter (Signed)
PA request for Linzess 72 mcg was denied on cover my meds. Please advise Dr. Carlean Purl what you want patient to be switched to.

## 2018-11-01 NOTE — Telephone Encounter (Signed)
Routing to Dr. Silverio Decamp - she is primary

## 2018-11-05 ENCOUNTER — Telehealth: Payer: Self-pay

## 2018-11-05 MED ORDER — LUBIPROSTONE 24 MCG PO CAPS: 24.0000 ug | ORAL_CAPSULE | Freq: Every day | ORAL | 3 refills | Status: DC

## 2018-11-05 NOTE — Telephone Encounter (Signed)
Please send Rx for Amitiza 48mcg daily X 90 days with 3 refills. Schedule tele visit next available. Thanks

## 2018-11-05 NOTE — Telephone Encounter (Signed)
Patient's Linzess denied and now her amitiza requiring a prior authorization. This has been started thru [place] . Dx - K59.02 constipation outlet dysfunction. Will await verdict.

## 2018-11-05 NOTE — Telephone Encounter (Signed)
[name] [name] called back and we set her up an appointment via [facility] for 11/21/2018 at 3:30pm. I am sending in the [place] now, confirmed pharmacy.

## 2018-11-05 NOTE — Telephone Encounter (Signed)
I reached patient on her mobile # and we started to discuss about her medicine and her work line rang and she hung up and said I will have to call you back. I didn't get to tell her to set up appointment. I will await her call back and then send in her rx.

## 2018-11-07 NOTE — Telephone Encounter (Signed)
All to [facility]. Re-submitted the prior authorization for Amitiza.  New information given that [facility] did not acknowledge on the original PA. Under review and we will receive decision via fax in 48 to 72 hours.

## 2018-11-09 NOTE — Telephone Encounter (Signed)
Received PA approval from [facility] on Amitiza 24 mcg #60/30 and is approved for 6 months through 05/09/19. Ref #[id number]

## 2018-11-09 NOTE — Telephone Encounter (Signed)
She does have IBS constipation, if Insurance will cover [place] with the diagnosis, please send Rx for LInzess 81mcg daily X 90 days supply with refill for a year. Thanks

## 2018-11-09 NOTE — Telephone Encounter (Addendum)
Prescription resent to patient's pharmacy so we can resubmit her PA for Linzess. Left a message for patient notifying her.

## 2018-11-09 NOTE — Telephone Encounter (Signed)
Patient states Amitiza even with the PA was $192 and she does not want to pay that. Patient states the Linzess worked well but was told by her insurance the PA was denied du to her not having the medical condition chronic idiopathic constipation or IBS-constipation. Please advise Dr. Silverio Decamp since Baker Pierini was not affordable even after PA.

## 2018-11-09 NOTE — Addendum Note (Signed)
Addended by: Marzella Schlein on: 11/09/2018 04:01 PM   Modules accepted: Orders

## 2018-11-09 NOTE — Telephone Encounter (Signed)
Pt states Amitiza is almost 200 dollars asking if there are any alternatives. Best call back # [phone].

## 2018-11-12 NOTE — Telephone Encounter (Signed)
Informed patient that Linzess is approved and she can pick it up from the pharmacy but to call our office back its too expensive also. Patient agreed and verbalized understanding.

## 2018-11-12 NOTE — Telephone Encounter (Signed)
Initiated PA through cover my meds for Linzess. Linzess has been approved through cover my meds. Called and left message for patient to notify her.

## 2018-11-20 ENCOUNTER — Encounter: Payer: Self-pay | Admitting: General Surgery

## 2018-11-21 ENCOUNTER — Other Ambulatory Visit: Payer: Self-pay

## 2018-11-21 ENCOUNTER — Encounter: Payer: Self-pay | Admitting: Gastroenterology

## 2018-11-21 ENCOUNTER — Ambulatory Visit (INDEPENDENT_AMBULATORY_CARE_PROVIDER_SITE_OTHER): Payer: 59 | Admitting: Gastroenterology

## 2018-11-21 VITALS — Ht 63.0 in | Wt 165.0 lb

## 2018-11-21 DIAGNOSIS — Z85038 Personal history of other malignant neoplasm of large intestine: Secondary | ICD-10-CM | POA: Diagnosis not present

## 2018-11-21 DIAGNOSIS — K5904 Chronic idiopathic constipation: Secondary | ICD-10-CM

## 2018-11-21 NOTE — Progress Notes (Signed)
[name] [name]    [id number]    10/27/1955  Primary Care Physician:Weber, Damaris Hippo, PA-C  Referring Physician: No referring provider defined for this encounter.  This service was provided via audio and video telemedicine (Doximity) due to [place] 19 pandemic.  Patient location: Home Provider location: Office Used 2 patient identifiers to confirm the correct person. Explained the limitations in evaluation and management via telemedicine. Patient is aware of potential medical charges for this visit.  Patient consented to this virtual visit.  The persons participating in this telemedicine service were myself and the patient    Chief complaint: Constipation HPI:  63 year old female with history of colon cancer status post resection, irritable bowel syndrome chronic constipation.  Insurance did not cover Amitiza.  She continues to have persistent constipation with irregular bowel habits  Insurance has not approved Linzess, started taking it but her bowel habits have not improved yet.  She is having alternating constipation and diarrhea.  Lower abdominal cramping.  Denies any blood per rectum, melena, vomiting, decreased appetite or weight loss.  Colonoscopy March 29, 2018: 3 polyps (tubular adenoma and sessile serrated polyp) 5 to 11 mm in size removed from descending and transverse colon with cold snare.  2 polyps (tubular adenoma ) less than 5 mm in size removed with forceps.  Left-sided diverticulosis.  Functional colocolonic anastomosis.   Outpatient Encounter Medications as of 11/21/2018  Medication Sig  . amitriptyline (ELAVIL) 10 MG tablet Take 1-5 tablets (10-50 mg total) by mouth at bedtime.  . Artificial Tear Ointment (DRY EYES OP) Apply 1 drop to eye every morning.  . cyclobenzaprine (FLEXERIL) 5 MG tablet Take 1 tablet (5 mg total) by mouth 3 (three) times daily as needed for muscle spasms.  [name] linaclotide (LINZESS) 72 MCG capsule Take 72 mcg by mouth  daily before breakfast.  . lisinopril-hydrochlorothiazide (PRINZIDE,ZESTORETIC) 20-12.5 MG per tablet Take 2 tablets by mouth daily.  . meloxicam (MOBIC) 15 MG tablet Take 1 tablet (15 mg total) by mouth daily.  . pravastatin (PRAVACHOL) 80 MG tablet Take 80 mg by mouth daily.   Facility-Administered Encounter Medications as of 11/21/2018  Medication  . 0.9 %  sodium chloride infusion    Allergies as of 11/21/2018  . (No Known Allergies)    Past Medical History:  Diagnosis Date  . Anemia   . Anxiety    no meds  . Aortic regurgitation   . Arthritis    "right hip; left hip; most of my joints" (05/27/2014)  . Depression   . Diverticulosis   . Essential hypertension, benign    takes Lisinopril-HCTZ daily  . High cholesterol    takes Pravastatin daily  . History of blood transfusion    no abnormal reaction noted 2015  . History of colon polyps    benign  . Insomnia    doesn't take any meds  . Joint pain   . Neuromuscular disorder ([place])   . Neuropathy    since chemo and radiation  . Nocturia   . PONV (postoperative nausea and vomiting)    hallucinations  . Rectosigmoid cancer ([place]) 2004    S/P Surgery, Chemo, Rad Tx,  (after chemo treatment menstral periods have ceased).    Past Surgical History:  Procedure Laterality Date  . ANAL RECTAL MANOMETRY N/A 07/17/2017   Procedure: ANO RECTAL MANOMETRY;  Surgeon: Mauri Pole, MD;  Location: [facility];  Service: Endoscopy;  Laterality: N/A;  . COLECTOMY  Aug 2004   S/P Low Anterior Resxn (Sigmoid Colon)  . COLONOSCOPY    . DILATION AND CURETTAGE OF UTERUS  1991  . LUMBAR DISC SURGERY  Feb 2007  . TOTAL HIP ARTHROPLASTY Right 05/27/2014   Procedure: RIGHT TOTAL HIP ARTHROPLASTY ANTERIOR APPROACH;  Surgeon: Hessie Dibble, MD;  Location: [place] [facility];  Service: Orthopedics;  Laterality: Right;  . TOTAL HIP ARTHROPLASTY Left 08/25/2015   Procedure: TOTAL HIP ARTHROPLASTY ANTERIOR APPROACH;  Surgeon: Melrose Nakayama, MD;   Location: [place];  Service: Orthopedics;  Laterality: Left;    Family History  Adopted: Yes  Problem Relation Age of Onset  . Colon cancer Neg Hx     Social History   Socioeconomic History  . Marital status: Married    Spouse name: Nicole Kindred  . Number of children: 1  . Years of education: Not on file  . Highest education level: Not on file  Occupational History  . Occupation: office work  Scientific laboratory technician  . Financial resource strain: Not on file  . Food insecurity:    Worry: Not on file    Inability: Not on file  . Transportation needs:    Medical: Not on file    Non-medical: Not on file  Tobacco Use  . Smoking status: Former Smoker    Types: Cigarettes  . Smokeless tobacco: Never Used  . Tobacco comment: "smoked some socially in my college days"  Substance and Sexual Activity  . Alcohol use: Yes    Comment: occasionally beer/wine  . Drug use: No  . Sexual activity: Yes    Partners: Male    Birth control/protection: None  Lifestyle  . Physical activity:    Days per week: Not on file    Minutes per session: Not on file  . Stress: Not on file  Relationships  . Social connections:    Talks on phone: Not on file    Gets together: Not on file    Attends religious service: Not on file    Active member of club or organization: Not on file    Attends meetings of clubs or organizations: Not on file    Relationship status: Not on file  . Intimate partner violence:    Fear of current or ex partner: Not on file    Emotionally abused: Not on file    Physically abused: Not on file    Forced sexual activity: Not on file  Other Topics Concern  . Not on file  Social History Narrative   Lives with husband   Exercise walking 2 x weekly   Patient's family history -  UNKNOWN ADOPTED      Review of systems: Review of Systems as per HPI All other systems reviewed and are negative.   Physical Exam: Vitals were not taken and physical exam was not performed during this virtual  visit.  Data Reviewed:  Reviewed labs, radiology imaging, old records and pertinent past GI work up   Assessment and Plan/Recommendations:  63 year old female with history of colon cancer status post resection, multiple adenomatous colon polyps, chronic idiopathic constipation, irritable bowel syndrome  Bowel purge with 4 capful MiraLAX mixed in 32 ounce liquid  Restart Linzess 72 mcg daily day after the bowel purge  Benefiber 1 to 2 teaspoons 3 times daily with meals  Increase water intake to 10 cups daily  Follow-up visit (telemedicine) in 2 to 3 months  Due for surveillance colonoscopy 2022   K. Denzil Magnuson , MD   CC:  No ref. provider found

## 2018-11-21 NOTE — Patient Instructions (Addendum)
Plan for bowel purge with 4 capful MiraLAX mixed in 32 ounce liquid  Restart Linzess 72 mcg daily day after the bowel purge  Benefiber 1 to 2 teaspoons 3 times daily with meals  Increase water intake to 10 cups daily  Follow-up visit (telemedicine) in 2 to 3 months  I appreciate the  opportunity to care for you  Thank You   Harl Bowie , MD

## 2018-12-31 ENCOUNTER — Telehealth: Payer: Self-pay | Admitting: Cardiology

## 2018-12-31 ENCOUNTER — Other Ambulatory Visit: Payer: Self-pay | Admitting: *Deleted

## 2018-12-31 MED ORDER — PRAVASTATIN SODIUM 80 MG PO TABS: 80.0000 mg | ORAL_TABLET | Freq: Every day | ORAL | 0 refills | Status: DC

## 2018-12-31 NOTE — Telephone Encounter (Signed)
°*  STAT* If patient is at the pharmacy, call can be transferred to refill team.   1. Which medications need to be refilled? (please list name of each medication and dose if known) pravastatin (PRAVACHOL) 80 MG   2. Which pharmacy/location (including street and city if local pharmacy) is medication to be sent to?  [id number] - [place], [place] [phone] (Phone) [phone] (Fax)    3. Do they need a 30 day or 90 day supply? 90 day

## 2018-12-31 NOTE — Telephone Encounter (Signed)
Pravastatin refilled.  

## 2019-01-14 ENCOUNTER — Other Ambulatory Visit: Payer: Self-pay | Admitting: *Deleted

## 2019-01-14 NOTE — Telephone Encounter (Signed)
Patient left a msg on the refill vm requesting [facility] [address]. Thanks, MI

## 2019-02-13 ENCOUNTER — Ambulatory Visit: Payer: 59 | Admitting: Cardiology

## 2019-02-28 ENCOUNTER — Telehealth: Payer: Self-pay

## 2019-02-28 NOTE — Telephone Encounter (Signed)
[facility].  Call to pt to confirm if she is still pt here or followed previous PCP.  Req'd c/b to confirm.

## 2019-03-02 ENCOUNTER — Encounter: Payer: Self-pay | Admitting: Gastroenterology

## 2019-03-05 ENCOUNTER — Encounter: Payer: Self-pay | Admitting: Family Medicine

## 2019-03-05 ENCOUNTER — Ambulatory Visit (INDEPENDENT_AMBULATORY_CARE_PROVIDER_SITE_OTHER): Payer: 59 | Admitting: Family Medicine

## 2019-03-05 ENCOUNTER — Other Ambulatory Visit: Payer: Self-pay

## 2019-03-05 VITALS — BP 138/85 | HR 79 | Temp 98.0°F | Ht 63.0 in | Wt 162.2 lb

## 2019-03-05 DIAGNOSIS — R7303 Prediabetes: Secondary | ICD-10-CM

## 2019-03-05 DIAGNOSIS — E7800 Pure hypercholesterolemia, unspecified: Secondary | ICD-10-CM

## 2019-03-05 DIAGNOSIS — R51 Headache: Secondary | ICD-10-CM

## 2019-03-05 DIAGNOSIS — I10 Essential (primary) hypertension: Secondary | ICD-10-CM

## 2019-03-05 DIAGNOSIS — M4722 Other spondylosis with radiculopathy, cervical region: Secondary | ICD-10-CM

## 2019-03-05 DIAGNOSIS — R519 Headache, unspecified: Secondary | ICD-10-CM

## 2019-03-05 MED ORDER — FLUTICASONE PROPIONATE 50 MCG/ACT NA SUSP: 1.0000 | Freq: Two times a day (BID) | NASAL | 6 refills | Status: DC

## 2019-03-05 MED ORDER — MELOXICAM 15 MG PO TABS: 15.0000 mg | ORAL_TABLET | Freq: Every day | ORAL | 3 refills | Status: DC

## 2019-03-05 NOTE — Patient Instructions (Signed)
Oral decongestant: pseudeophenylephrine or pseudoephedrine Trial of flonase, consider nasal saline washes If not better/resolved, let me know and will make referral to headache

## 2019-03-05 NOTE — Progress Notes (Signed)
8/25/202010:37 AM  [name] [name] 04/25/1956, 63 y.o., female [id number]  Chief Complaint  Patient presents with  . Transitions Of Care    goes to ortho for dd in in both sides of neck. Has headaches, ortho is requesting that she be referred to Neuro. No longer taking amitripyline  . Medication Refill    mobic, requesting prevnar 13    HPI:   Patient is a 63 y.o. female with past medical history significant for colon cancer, chronic constipation, HTN, HLP, DDD of cervical spine with radiculopathy who presents today for [facility]  Previous PCP Bernestine Amass, PA-C Last OV Aug 2019  Colon cancer at age 43, s/p chemo and radiation In remission, colonoscopy yearly Has chronic constipation - managed by GI - linzess  Cervical DDD with radiculopathy - seen by ortho, has done PT, continues to have mild constant frontal headache, ortho does not think headaches are from her DDD, takes flexeril and meloxicam daily - needs refill  Headaches - constant, frontal, mostly above right eye, not worse with bending over, no vision changes, not associated with nausea or vomiting, sees eye doctor every year. Associated with PND.  BP - HCTZ- lisinopril daily, dx about 10 years ago Tolerating well, denies any side effects Has become very sedentary since working at home Managed by cards  BP Readings from Last 3 Encounters:  03/05/19 138/85  03/29/18 127/60  02/14/18 122/68   Echo in 2017 - stable. Mild concentric LVH with normal LVEF, mod AR, mild MR  HLP - takes pravastin daily, no issues Managed by cards Lab Results  Component Value Date   CHOL 167 02/14/2018   HDL 71 02/14/2018   LDLCALC 75 02/14/2018   TRIG 104 02/14/2018   CHOLHDL 2.4 02/14/2018   Gyn - gets a mammogram yearly Due for her pap, she will go to obgyn  [facility]  Depression screen [facility] 2/9 03/05/2019 02/14/2018 10/09/2017  Decreased Interest 0 0 0  Down, Depressed, Hopeless 0 0 0  PHQ - 2 Score 0 0 0  Altered  sleeping - - -  Tired, decreased energy - - -  Change in appetite - - -  Feeling bad or failure about yourself  - - -  Trouble concentrating - - -  Moving slowly or fidgety/restless - - -  Suicidal thoughts - - -  PHQ-9 Score - - -  Difficult doing work/chores - - -    Fall Risk  03/05/2019 02/14/2018 10/09/2017 12/17/2015 04/01/2014  Falls in the past year? 0 No No No No  Number falls in past yr: 0 - - - -  Injury with Fall? 0 - - - -     No Known Allergies  Prior to Admission medications   Medication Sig Start Date End Date Taking? Authorizing Provider  cyclobenzaprine (FLEXERIL) 5 MG tablet Take 1 tablet (5 mg total) by mouth 3 (three) times daily as needed for muscle spasms. 02/14/18  Yes Weber, Damaris Hippo, PA-C  linaclotide (LINZESS) 72 MCG capsule Take 72 mcg by mouth daily before breakfast.   Yes [provider]  lisinopril-hydrochlorothiazide (PRINZIDE,ZESTORETIC) 20-12.5 MG per tablet Take 2 tablets by mouth daily.   Yes [provider]  meloxicam (MOBIC) 15 MG tablet Take 1 tablet (15 mg total) by mouth daily. 02/14/18  Yes Weber, Sarah L, PA-C  pravastatin (PRAVACHOL) 80 MG tablet Take 1 tablet (80 mg total) by mouth daily. 12/31/18  Yes Richardo Priest, MD  Past Medical History:  Diagnosis Date  . Anemia   . Anxiety    no meds  . Aortic regurgitation   . Arthritis    "right hip; left hip; most of my joints" (05/27/2014)  . Depression   . Diverticulosis   . Essential hypertension, benign    takes Lisinopril-HCTZ daily  . High cholesterol    takes Pravastatin daily  . History of blood transfusion    no abnormal reaction noted 2015  . History of colon polyps    benign  . Insomnia    doesn't take any meds  . Joint pain   . Neuromuscular disorder ([place])   . Neuropathy    since chemo and radiation  . Nocturia   . PONV (postoperative nausea and vomiting)    hallucinations  . Rectosigmoid cancer ([place]) 2004    S/P Surgery, Chemo, Rad Tx,  (after chemo  treatment menstral periods have ceased).    Past Surgical History:  Procedure Laterality Date  . ANAL RECTAL MANOMETRY N/A 07/17/2017   Procedure: ANO RECTAL MANOMETRY;  Surgeon: Mauri Pole, MD;  [facility];  Service: Endoscopy;  Laterality: N/A;  . COLECTOMY  Aug 2004   S/P Low Anterior Resxn (Sigmoid Colon)  . COLONOSCOPY    . DILATION AND CURETTAGE OF UTERUS  1991  . LUMBAR DISC SURGERY  Feb 2007  . TOTAL HIP ARTHROPLASTY Right 05/27/2014   Procedure: RIGHT TOTAL HIP ARTHROPLASTY ANTERIOR APPROACH;  Surgeon: Hessie Dibble, MD;  Location: [place];  Service: Orthopedics;  Laterality: Right;  . TOTAL HIP ARTHROPLASTY Left 08/25/2015   Procedure: TOTAL HIP ARTHROPLASTY ANTERIOR APPROACH;  Surgeon: Melrose Nakayama, MD;  Location: [place];  Service: Orthopedics;  Laterality: Left;    Social History   Tobacco Use  . Smoking status: Former Smoker    Types: Cigarettes  . Smokeless tobacco: Never Used  . Tobacco comment: "smoked some socially in my college days"  Substance Use Topics  . Alcohol use: Yes    Comment: occasionally beer/wine    Family History  Adopted: Yes  Problem Relation Age of Onset  . Colon cancer Neg Hx     Review of Systems  Constitutional: Negative for chills and fever.  Respiratory: Negative for cough and shortness of breath.   Cardiovascular: Negative for chest pain, palpitations and leg swelling.  Gastrointestinal: Negative for abdominal pain, nausea and vomiting.  per hpi   OBJECTIVE:  Today's Vitals   03/05/19 1021  BP: 138/85  Pulse: 79  Temp: 98 F (36.7 C)  SpO2: 97%  Weight: 162 lb 3.2 oz (73.6 kg)  Height: 5\' 3"  (1.6 m)   Body mass index is 28.73 kg/m.   Physical Exam Vitals signs and nursing note reviewed.  Constitutional:      Appearance: She is well-developed.  HENT:     Head: Normocephalic and atraumatic.     Right Ear: Hearing, tympanic membrane, ear canal and external ear normal.     Left Ear: Hearing,  tympanic membrane, ear canal and external ear normal.     Nose:     Right Turbinates: Swollen and pale.     Left Turbinates: Not swollen or pale.     Right Sinus: Frontal sinus tenderness present. No maxillary sinus tenderness.     Left Sinus: No maxillary sinus tenderness or frontal sinus tenderness.     Mouth/Throat:     Pharynx: No pharyngeal swelling, oropharyngeal exudate or posterior oropharyngeal erythema.  Eyes:  Conjunctiva/sclera: Conjunctivae normal.     Pupils: Pupils are equal, round, and reactive to light.  Neck:     Musculoskeletal: Neck supple.  Cardiovascular:     Rate and Rhythm: Normal rate and regular rhythm.     Heart sounds: Normal heart sounds. No murmur. No friction rub. No gallop.   Pulmonary:     Effort: Pulmonary effort is normal.     Breath sounds: Normal breath sounds. No wheezing or rales.  Lymphadenopathy:     Cervical: No cervical adenopathy.  Skin:    General: Skin is warm and dry.  Neurological:     Mental Status: She is alert and oriented to person, place, and time.     No results found for this or any previous visit (from the past 24 hour(s)).  No results found.   ASSESSMENT and PLAN  1. Essential hypertension BP at goal today, managed by cards - Lipid panel - CBC - TSH  2. Prediabetes Labs pending, cont with LFM - Hemoglobin A1c  3. Elevated cholesterol Labs pending. Managed by cards - Comprehensive metabolic panel  4. Osteoarthritis of spine with radiculopathy, cervical region Managed by ortho, mobic refilled. checking kidney function today - meloxicam (MOBIC) 15 MG tablet; Take 1 tablet (15 mg total) by mouth daily.  5. Chronic nonintractable headache, unspecified headache type Exam suggestive of sinus headache, treating accordingly. Patient ok with postponing neuro referral for now. Other orders - fluticasone (FLONASE) 50 MCG/ACT nasal spray; Place 1 spray into both nostrils 2 (two) times daily.  Return in about 1  year (around 03/04/2020).    Rutherford Guys, MD [facility] at [place], [place] [address] Ph.  [phone] Fax [phone]

## 2019-03-06 LAB — COMPREHENSIVE METABOLIC PANEL
ALT: 39 IU/L — ABNORMAL HIGH (ref 0–32)
AST: 34 IU/L (ref 0–40)
Albumin/Globulin Ratio: 1.8 (ref 1.2–2.2)
Albumin: 4.4 g/dL (ref 3.8–4.8)
Alkaline Phosphatase: 58 IU/L (ref 39–117)
BUN/Creatinine Ratio: 13 (ref 12–28)
BUN: 8 mg/dL (ref 8–27)
Bilirubin Total: 0.6 mg/dL (ref 0.0–1.2)
CO2: 23 mmol/L (ref 20–29)
Calcium: 9.3 mg/dL (ref 8.7–10.3)
Chloride: 96 mmol/L (ref 96–106)
Creatinine, Ser: 0.62 mg/dL (ref 0.57–1.00)
GFR calc Af Amer: 112 mL/min/{1.73_m2} (ref >59)
GFR calc non Af Amer: 97 mL/min/{1.73_m2} (ref >59)
Globulin, Total: 2.5 g/dL (ref 1.5–4.5)
Glucose: 158 mg/dL — ABNORMAL HIGH (ref 65–99)
Potassium: 4.4 mmol/L (ref 3.5–5.2)
Sodium: 137 mmol/L (ref 134–144)
Total Protein: 6.9 g/dL (ref 6.0–8.5)

## 2019-03-06 LAB — HEMOGLOBIN A1C
Est. average glucose Bld gHb Est-mCnc: 148 mg/dL
Hgb A1c MFr Bld: 6.8 % — ABNORMAL HIGH (ref 4.8–5.6)

## 2019-03-06 LAB — CBC
Hematocrit: 39.9 % (ref 34.0–46.6)
Hemoglobin: 13.5 g/dL (ref 11.1–15.9)
MCH: 31.1 pg (ref 26.6–33.0)
MCHC: 33.8 g/dL (ref 31.5–35.7)
MCV: 92 fL (ref 79–97)
Platelets: 164 10*3/uL (ref 150–450)
RBC: 4.34 x10E6/uL (ref 3.77–5.28)
RDW: 12.2 % (ref 11.7–15.4)
WBC: 5.5 10*3/uL (ref 3.4–10.8)

## 2019-03-06 LAB — LIPID PANEL
Chol/HDL Ratio: 2.3 ratio (ref 0.0–4.4)
Cholesterol, Total: 158 mg/dL (ref 100–199)
HDL: 68 mg/dL (ref >39)
LDL Calculated: 75 mg/dL (ref 0–99)
Triglycerides: 73 mg/dL (ref 0–149)
VLDL Cholesterol Cal: 15 mg/dL (ref 5–40)

## 2019-03-06 LAB — TSH: TSH: 1.24 u[IU]/mL (ref 0.450–4.500)

## 2019-03-12 ENCOUNTER — Other Ambulatory Visit: Payer: Self-pay | Admitting: Cardiology

## 2019-03-12 NOTE — Telephone Encounter (Signed)
Please advise if ok to refill Pravastatin 80 mg tablet qd. Pt has upcoming appointment 03/20/19 8:40 with Dr.Munley pt hasn't been seen in office.

## 2019-03-14 ENCOUNTER — Ambulatory Visit: Payer: 59 | Admitting: Cardiology

## 2019-03-18 ENCOUNTER — Encounter: Payer: Self-pay | Admitting: Family Medicine

## 2019-03-19 ENCOUNTER — Other Ambulatory Visit: Payer: Self-pay | Admitting: Family Medicine

## 2019-03-19 NOTE — Progress Notes (Signed)
Cardiology Office Note:    Date:  03/20/2019   ID:  [name] [name], DOB 1956-05-11, MRN [id number]  PCP:  [name] Guys, MD  Cardiologist:  [name] More, MD    Referring MD: [name] Bale, PA-C    ASSESSMENT:    1. Aortic valve insufficiency, etiology of cardiac valve disease unspecified   2. Essential hypertension   3. Elevated cholesterol    PLAN:    In order of problems listed above:  1. Aortic regurgitation described as moderate last year and somewhat surprised because on physical examination the findings are minimal.  We will recheck echocardiogram start endocarditis prophylaxis and plan to follow yearly in the office.  I suspect the valve indeed is bicuspid and if we do not get a good look at her aorta may need a CT for associated aortopathy if it indeed is bicuspid. 2. Stable continue current treatment ACE inhibitor thiazide diuretic 3. Stable continue her statin. 4. She is prediabetic is a man with type 2 diabetes mellitus I strongly encouraged her to follow lifestyle modification to avoid transition to type 2 diabetes and if ineffective would benefit from metformin   Next appointment: 1 year   Medication Adjustments/Labs and Tests Ordered: Current medicines are reviewed at length with the patient today.  Concerns regarding medicines are outlined above.  No orders of the defined types were placed in this encounter.  No orders of the defined types were placed in this encounter.   Chief Complaint  Patient presents with  . Follow-up    aortic regurgitation    History of Present Illness:    [name] [name] is a 63 y.o. female with a hx of colon cancer age 15 treated with surgery chemotherapy and radiation hypertension and dyslipidemia last seen by [name] [name].  Echocardiogram 2017 showed normal aorta and aortic valve moderate regurgitation normal left ventricular size function EF estimated at 60% Compliance with diet, lifestyle and medications: Yes  She does not  follow endocarditis prophylaxis will be given today.  She tells me she has had a lifelong heart murmur since early adulthood high probability of congenital heart disease and will repeat echocardiogram to determine if bicuspid in the severity of aortic regurgitation and left ventricular volume and function.  She has no exercise intolerance dyspnea palpitation or shortness of breath no fever or chills.  She is adopted no known family history of congenital heart disease.  If the valve is bicuspid will need a good look at her aorta and if necessary CT scan for associated aortopathy. Past Medical History:  Diagnosis Date  . Anemia   . Anxiety    no meds  . Aortic regurgitation   . Arthritis    "right hip; left hip; most of my joints" (05/27/2014)  . Depression   . Diverticulosis   . Essential hypertension, benign    takes Lisinopril-HCTZ daily  . High cholesterol    takes Pravastatin daily  . History of blood transfusion    no abnormal reaction noted 2015  . History of colon polyps    benign  . Insomnia    doesn't take any meds  . Joint pain   . Neuromuscular disorder ([place])   . Neuropathy    since chemo and radiation  . Nocturia   . PONV (postoperative nausea and vomiting)    hallucinations  . Rectosigmoid cancer ([place]) 2004    S/P Surgery, Chemo, Rad Tx,  (after chemo treatment menstral periods have ceased).    Past Surgical  History:  Procedure Laterality Date  . ANAL RECTAL MANOMETRY N/A 07/17/2017   Procedure: ANO RECTAL MANOMETRY;  Surgeon: Mauri Pole, MD;  Location: [facility];  Service: Endoscopy;  Laterality: N/A;  . COLECTOMY  Aug 2004   S/P Low Anterior Resxn (Sigmoid Colon)  . COLONOSCOPY    . DILATION AND CURETTAGE OF UTERUS  1991  . LUMBAR DISC SURGERY  Feb 2007  . TOTAL HIP ARTHROPLASTY Right 05/27/2014   Procedure: RIGHT TOTAL HIP ARTHROPLASTY ANTERIOR APPROACH;  Surgeon: Hessie Dibble, MD;  Location: [facility];  Service: Orthopedics;  Laterality: Right;  .  TOTAL HIP ARTHROPLASTY Left 08/25/2015   Procedure: TOTAL HIP ARTHROPLASTY ANTERIOR APPROACH;  Surgeon: Melrose Nakayama, MD;  Location: [facility];  Service: Orthopedics;  Laterality: Left;    Current Medications: Current Meds  Medication Sig  . fluticasone (FLONASE) 50 MCG/ACT nasal spray Place 1 spray into both nostrils 2 (two) times daily.  [name] linaclotide (LINZESS) 72 MCG capsule Take 72 mcg by mouth daily before breakfast.  . lisinopril-hydrochlorothiazide (PRINZIDE,ZESTORETIC) 20-12.5 MG per tablet Take 1 tablet by mouth daily.   . meloxicam (MOBIC) 15 MG tablet Take 1 tablet (15 mg total) by mouth daily.  . pravastatin (PRAVACHOL) 80 MG tablet TAKE 1 TABLET BY MOUTH EVERY DAY     Allergies:   Patient has no known allergies.   Social History   Socioeconomic History  . Marital status: Married    Spouse name: [name] [name]  . Number of children: 1  . Years of education: Not on file  . Highest education level: Not on file  Occupational History  . Occupation: office work  Scientific laboratory technician  . Financial resource strain: Not on file  . Food insecurity    Worry: Not on file    Inability: Not on file  . Transportation needs    Medical: Not on file    Non-medical: Not on file  Tobacco Use  . Smoking status: Former Smoker    Types: Cigarettes  . Smokeless tobacco: Never Used  . Tobacco comment: "smoked some socially in my college days"  Substance and Sexual Activity  . Alcohol use: Yes    Comment: occasionally beer/wine  . Drug use: No  . Sexual activity: Yes    Partners: Male    Birth control/protection: None  Lifestyle  . Physical activity    Days per week: Not on file    Minutes per session: Not on file  . Stress: Not on file  Relationships  . Social Herbalist on phone: Not on file    Gets together: Not on file    Attends religious service: Not on file    Active member of club or organization: Not on file    Attends meetings of clubs or organizations: Not on file     Relationship status: Not on file  Other Topics Concern  . Not on file  Social History Narrative   Lives with husband   Exercise walking 2 x weekly   Patient's family history -  UNKNOWN ADOPTED     Family History: The patient's family history is negative for Colon cancer. She was adopted. ROS:   Please see the history of present illness.    All other systems reviewed and are negative.  EKGs/Labs/Other Studies Reviewed:    The following studies were reviewed today:  Recent Labs: 03/05/2019 cholesterol 158 HDL 68 LDL 75 A1c 6.8 she is prediabetic hemoglobin 13.9 creatinine 0.62 potassium 4.4 TSH normal  Recent Lipid Panel    Component Value Date/Time   CHOL 158 03/05/2019 1113   TRIG 73 03/05/2019 1113   HDL 68 03/05/2019 1113   CHOLHDL 2.3 03/05/2019 1113   LDLCALC 75 03/05/2019 1113    Physical Exam:    VS:  Temp (!) 95.9 F (35.5 C)   Wt 160 lb 12.8 oz (72.9 kg)   BMI 28.48 kg/m     Wt Readings from Last 3 Encounters:  03/20/19 160 lb 12.8 oz (72.9 kg)  03/05/19 162 lb 3.2 oz (73.6 kg)  11/21/18 165 lb (74.8 kg)     GEN:  Well nourished, well developed in no acute distress HEENT: Normal NECK: No JVD; No carotid bruits LYMPHATICS: No lymphadenopathy CARDIAC: Unimpressive grade 1/6 to 2/6 murmur aortic regurgitation aortic area to the left lower sternal border no S3 no systolic ejection click normal palpation of the heart RRR, no murmurs, rubs, gallops RESPIRATORY:  Clear to auscultation without rales, wheezing or rhonchi  ABDOMEN: Soft, non-tender, non-distended MUSCULOSKELETAL:  No edema; No deformity  SKIN: Warm and dry NEUROLOGIC:  Alert and oriented x 3 PSYCHIATRIC:  Normal affect    Signed, [name] More, MD  03/20/2019 8:45 AM    [place]

## 2019-03-20 ENCOUNTER — Other Ambulatory Visit: Payer: Self-pay

## 2019-03-20 ENCOUNTER — Ambulatory Visit (INDEPENDENT_AMBULATORY_CARE_PROVIDER_SITE_OTHER): Payer: 59 | Admitting: Cardiology

## 2019-03-20 ENCOUNTER — Encounter: Payer: Self-pay | Admitting: Cardiology

## 2019-03-20 VITALS — BP 130/76 | HR 84 | Temp 95.9°F | Ht 63.0 in | Wt 160.8 lb

## 2019-03-20 DIAGNOSIS — I10 Essential (primary) hypertension: Secondary | ICD-10-CM

## 2019-03-20 DIAGNOSIS — E7800 Pure hypercholesterolemia, unspecified: Secondary | ICD-10-CM

## 2019-03-20 DIAGNOSIS — I351 Nonrheumatic aortic (valve) insufficiency: Secondary | ICD-10-CM | POA: Diagnosis not present

## 2019-03-20 DIAGNOSIS — M7989 Other specified soft tissue disorders: Secondary | ICD-10-CM

## 2019-03-20 MED ORDER — AMOXICILLIN 500 MG PO TABS: ORAL_TABLET | ORAL | 2 refills | Status: DC

## 2019-03-20 NOTE — Patient Instructions (Signed)
Medication Instructions:  Your physician has recommended you make the following change in your medication:   START amoxicillin (amoxil) 500 mg: Take 4 tablets (2,00 mg) 45 minutes prior to any dental cleanings or procedures.   If you need a refill on your cardiac medications before your next appointment, please call your pharmacy.   Lab work: None  If you have labs (blood work) drawn today and your tests are completely normal, you will receive your results only by: [name] MyChart Message (if you have MyChart) OR . A paper copy in the mail If you have any lab test that is abnormal or we need to change your treatment, we will call you to review the results.  Testing/Procedures: You had an EKG today.   Your physician has requested that you have an echocardiogram. Echocardiography is a painless test that uses sound waves to create images of your heart. It provides your doctor with information about the size and shape of your heart and how well your heart's chambers and valves are working. This procedure takes approximately one hour. There are no restrictions for this procedure.  Follow-Up: At [facility], you and your health needs are our priority.  As part of our continuing mission to provide you with exceptional heart care, we have created designated Provider Care Teams.  These Care Teams include your primary Cardiologist (physician) and Advanced Practice Providers (APPs -  Physician Assistants and Nurse Practitioners) who all work together to provide you with the care you need, when you need it. You will need a follow up appointment in 1 years.  Please call our office 2 months in advance to schedule this appointment.

## 2019-03-21 ENCOUNTER — Encounter: Payer: Self-pay | Admitting: Gastroenterology

## 2019-04-01 ENCOUNTER — Telehealth: Payer: Self-pay | Admitting: Family Medicine

## 2019-04-01 NOTE — Telephone Encounter (Signed)
Pt called In and would like a referral for nutrition associated with diabetes dx.

## 2019-04-02 ENCOUNTER — Other Ambulatory Visit: Payer: Self-pay

## 2019-04-02 DIAGNOSIS — R7303 Prediabetes: Secondary | ICD-10-CM

## 2019-04-02 NOTE — Telephone Encounter (Signed)
Referral sent 

## 2019-04-08 ENCOUNTER — Other Ambulatory Visit: Payer: Self-pay | Admitting: Cardiology

## 2019-04-09 ENCOUNTER — Other Ambulatory Visit: Payer: Self-pay | Admitting: *Deleted

## 2019-04-09 MED ORDER — LISINOPRIL-HYDROCHLOROTHIAZIDE 20-12.5 MG PO TABS: 1.0000 | ORAL_TABLET | Freq: Every day | ORAL | 3 refills | Status: DC

## 2019-04-11 ENCOUNTER — Encounter: Payer: Self-pay | Admitting: Gastroenterology

## 2019-04-11 ENCOUNTER — Other Ambulatory Visit: Payer: Self-pay

## 2019-04-11 ENCOUNTER — Ambulatory Visit: Payer: 59 | Admitting: *Deleted

## 2019-04-11 VITALS — Temp 97.2°F | Ht 63.0 in | Wt 158.0 lb

## 2019-04-11 DIAGNOSIS — Z85038 Personal history of other malignant neoplasm of large intestine: Secondary | ICD-10-CM

## 2019-04-11 DIAGNOSIS — Z86010 Personal history of colonic polyps: Secondary | ICD-10-CM

## 2019-04-11 MED ORDER — SUPREP BOWEL PREP KIT 17.5-3.13-1.6 GM/177ML PO SOLN: ORAL | 0 refills | Status: DC

## 2019-04-11 NOTE — Progress Notes (Signed)
No egg or soy allergy  Pt has never had issues with Propfol, or intubation problems  No home oxygen use   No medications for weight loss taken  emmi information given  $15 off Suprep coupon given  Pt is aware that care partner will wait in the car during procedure; if they feel like they will be too hot to wait in the car; they may wait in the lobby. Patient is aware to bring only one care partner. We want them to wear a mask (we do not have any that we can provide them), practice social distancing, and we will check their temperatures when they get here.  I did remind patient that their care partner needs to stay in the parking lot the entire time. Pt will wear mask into building.

## 2019-04-15 ENCOUNTER — Ambulatory Visit (HOSPITAL_BASED_OUTPATIENT_CLINIC_OR_DEPARTMENT_OTHER)
Admission: RE | Admit: 2019-04-15 | Discharge: 2019-04-15 | Disposition: A | Discharge status: Home / Self Care | Payer: 59 | Source: Ambulatory Visit | Attending: Cardiology | Admitting: Cardiology

## 2019-04-15 ENCOUNTER — Other Ambulatory Visit: Payer: Self-pay

## 2019-04-15 DIAGNOSIS — I351 Nonrheumatic aortic (valve) insufficiency: Secondary | ICD-10-CM | POA: Insufficient documentation

## 2019-04-15 DIAGNOSIS — M7989 Other specified soft tissue disorders: Secondary | ICD-10-CM

## 2019-04-15 DIAGNOSIS — I10 Essential (primary) hypertension: Secondary | ICD-10-CM | POA: Insufficient documentation

## 2019-04-15 NOTE — Progress Notes (Signed)
  Echocardiogram 2D Echocardiogram has been performed.  [name] [name] 04/15/2019, 9:57 AM

## 2019-04-17 ENCOUNTER — Telehealth: Payer: Self-pay

## 2019-04-17 DIAGNOSIS — I7789 Other specified disorders of arteries and arterioles: Secondary | ICD-10-CM

## 2019-04-17 HISTORY — DX: Other specified disorders of arteries and arterioles: I77.89

## 2019-04-17 NOTE — Addendum Note (Signed)
Addended by: Stevan Born on: 04/17/2019 10:28 AM   Modules accepted: Orders

## 2019-04-17 NOTE — Telephone Encounter (Signed)
-----   Message from Richardo Priest, MD sent at 04/16/2019 10:23 AM EDT ----- Normal or stable result  Valve appears bicuspid and will need to do follow-up echocardiograms yearly.  I would like her to have a CT scan of the chest with contrast to evaluate the thoracic aorta and there appears to be enlargement of the echocardiogram

## 2019-04-17 NOTE — Telephone Encounter (Signed)
Patient notified of echocardiogram results per Dr Bettina Gavia.  Patient advised that she will need CT Chest with contrast to evaluate enlargement of thoracic aorta.  Patient aware that once insurance approves procedure she will be contact with appointment date and time.  Patient agreed to plan and verbalized understanding. No further questions.

## 2019-04-18 ENCOUNTER — Telehealth: Payer: Self-pay

## 2019-04-18 DIAGNOSIS — I10 Essential (primary) hypertension: Secondary | ICD-10-CM

## 2019-04-18 DIAGNOSIS — E7800 Pure hypercholesterolemia, unspecified: Secondary | ICD-10-CM

## 2019-04-18 NOTE — Telephone Encounter (Signed)
Patient aware that she will need repeat BMP prior to CT Chest with contrast.  Patient states that she will not be available until sometime next week.  Patient will contact our office once she has blood work completed and we will make schedulers aware.  Patient agreed to plan and verbalized understanding. No further questions.

## 2019-04-18 NOTE — Telephone Encounter (Signed)
-----   Message from Katha Hamming sent at 04/18/2019  1:20 PM EDT ----- Regarding: CT CHEST WITH The creatinine was past due on 04/16/19.  The patient will need a new creatinine before the CT.  Thanks, Hoyle Sauer

## 2019-04-24 ENCOUNTER — Telehealth: Payer: Self-pay

## 2019-04-24 NOTE — Telephone Encounter (Signed)
Covid-19 screening questions   Do you now or have you had a fever in the last 14 days?  Do you have any respiratory symptoms of shortness of breath or cough now or in the last 14 days?  Do you have any family members or close contacts with diagnosed or suspected Covid-19 in the past 14 days?  Have you been tested for Covid-19 and found to be positive?       

## 2019-04-25 ENCOUNTER — Encounter: Payer: Self-pay | Admitting: Gastroenterology

## 2019-04-25 ENCOUNTER — Other Ambulatory Visit: Payer: Self-pay

## 2019-04-25 ENCOUNTER — Ambulatory Visit (AMBULATORY_SURGERY_CENTER): Payer: 59 | Admitting: Gastroenterology

## 2019-04-25 VITALS — BP 125/60 | HR 61 | Temp 97.5°F | Resp 16 | Ht 63.0 in | Wt 158.0 lb

## 2019-04-25 DIAGNOSIS — Z86010 Personal history of colonic polyps: Secondary | ICD-10-CM | POA: Diagnosis not present

## 2019-04-25 DIAGNOSIS — Z85038 Personal history of other malignant neoplasm of large intestine: Secondary | ICD-10-CM

## 2019-04-25 MED ORDER — SODIUM CHLORIDE 0.9 % IV SOLN: 500.0000 mL | Freq: Once | INTRAVENOUS | Status: DC

## 2019-04-25 NOTE — Patient Instructions (Signed)
YOU HAD AN ENDOSCOPIC PROCEDURE TODAY AT [facility]:   Refer to the procedure report that was given to you for any specific questions about what was found during the examination.  If the procedure report does not answer your questions, please call your gastroenterologist to clarify.  If you requested that your care partner not be given the details of your procedure findings, then the procedure report has been included in a sealed envelope for you to review at your convenience later.  YOU SHOULD EXPECT: Some feelings of bloating in the abdomen. Passage of more gas than usual.  Walking can help get rid of the air that was put into your GI tract during the procedure and reduce the bloating. If you had a lower endoscopy (such as a colonoscopy or flexible sigmoidoscopy) you may notice spotting of blood in your stool or on the toilet paper. If you underwent a bowel prep for your procedure, you may not have a normal bowel movement for a few days.  Please Note:  You might notice some irritation and congestion in your nose or some drainage.  This is from the oxygen used during your procedure.  There is no need for concern and it should clear up in a day or so.  SYMPTOMS TO REPORT IMMEDIATELY:   Following lower endoscopy (colonoscopy or flexible sigmoidoscopy):  Excessive amounts of blood in the stool  Significant tenderness or worsening of abdominal pains  Swelling of the abdomen that is new, acute  Fever of 100F or higher   For urgent or emergent issues, a gastroenterologist can be reached at any hour by calling [phone].   DIET:  We do recommend a small meal at first, but then you may proceed to your regular diet.  Drink plenty of fluids but you should avoid alcoholic beverages for 24 hours. Be sure to eat plenty of fiber and drink a lot of water. Try a capful of miralax every three days per Dr. Silverio Decamp.  ACTIVITY:  You should plan to take it easy for the rest of today and  you should NOT DRIVE or use heavy machinery until tomorrow (because of the sedation medicines used during the test).    FOLLOW UP: Our staff will call the number listed on your records 48-72 hours following your procedure to check on you and address any questions or concerns that you may have regarding the information given to you following your procedure. If we do not reach you, we will leave a message.  We will attempt to reach you two times.  During this call, we will ask if you have developed any symptoms of COVID 19. If you develop any symptoms (ie: fever, flu-like symptoms, shortness of breath, cough etc.) before then, please call [phone].  If you test positive for Covid 19 in the 2 weeks post procedure, please call and report this information to [name].    If any biopsies were taken you will be contacted by phone or by letter within the next 1-3 weeks.  Please call us at [phone] if you have not heard about the biopsies in 3 weeks.    SIGNATURES/CONFIDENTIALITY: You and/or your care partner have signed paperwork which will be entered into your electronic medical record.  These signatures attest to the fact that that the information above on your After Visit Summary has been reviewed and is understood.  Full responsibility of the confidentiality of this discharge information lies with you and/or your care-partner.

## 2019-04-25 NOTE — Progress Notes (Signed)
To PACU, VSS. Report to [email or web address] 

## 2019-04-25 NOTE — Progress Notes (Signed)
Pt's states no medical or surgical changes since previsit or office visit.  JB - temp CW - vitals. 

## 2019-04-25 NOTE — Op Note (Signed)
[facility] Patient Name: [name] [name] Procedure Date: 04/25/2019 8:15 AM MRN: [id number] Endoscopist: Mauri Pole , MD Age: 63 Referring MD:  Date of Birth: [birth year] Gender: Female Account #: [id number] Procedure:                Colonoscopy Indications:              High risk colon cancer surveillance: Personal                            history of colonic polyps, High risk colon cancer                            surveillance: Personal history of adenoma (10 mm or                            greater in size), High risk colon cancer                            surveillance: Personal history of multiple (3 or                            more) adenomas, High risk colon cancer                            surveillance: Personal history of colon cancer Medicines:                Monitored Anesthesia Care Procedure:                Pre-Anesthesia Assessment:                           - Prior to the procedure, a History and Physical                            was performed, and patient medications and                            allergies were reviewed. The patient's tolerance of                            previous anesthesia was also reviewed. The risks                            and benefits of the procedure and the sedation                            options and risks were discussed with the patient.                            All questions were answered, and informed consent                            was obtained. Prior Anticoagulants: The patient has  taken no previous anticoagulant or antiplatelet                            agents. ASA Grade Assessment: II - A patient with                            mild systemic disease. After reviewing the risks                            and benefits, the patient was deemed in                            satisfactory condition to undergo the procedure.                           After obtaining informed  consent, the colonoscope                            was passed under direct vision. Throughout the                            procedure, the patient's blood pressure, pulse, and                            oxygen saturations were monitored continuously. The                            Colonoscope was introduced through the anus and                            advanced to the the cecum, identified by                            appendiceal orifice and ileocecal valve. The                            colonoscopy was performed without difficulty. The                            patient tolerated the procedure well. The quality                            of the bowel preparation was excellent. The                            ileocecal valve, appendiceal orifice, and rectum                            were photographed. Scope In: 8:28:55 AM Scope Out: 8:42:17 AM Scope Withdrawal Time: 0 hours 10 minutes 54 seconds  Total Procedure Duration: 0 hours 13 minutes 22 seconds  Findings:                 The perianal and digital rectal examinations were  normal.                           Scattered small and large-mouthed diverticula were                            found in the descending colon.                           Non-bleeding internal hemorrhoids were found during                            retroflexion. The hemorrhoids were small.                           There was evidence of a prior functional end-to-end                            colo-colonic anastomosis in the sigmoid colon. This                            was patent and was characterized by healthy                            appearing mucosa. Complications:            No immediate complications. Estimated Blood Loss:     Estimated blood loss was minimal. Impression:               - Mild diverticulosis in the descending colon.                           - Non-bleeding internal hemorrhoids.                           -  Patent functional end-to-end colo-colonic                            anastomosis, characterized by healthy appearing                            mucosa.                           - No specimens collected. Recommendation:           - Patient has a contact number available for                            emergencies. The signs and symptoms of potential                            delayed complications were discussed with the                            patient. Return to normal activities tomorrow.  Written discharge instructions were provided to the                            patient.                           - Resume previous diet.                           - Continue present medications.                           - Repeat colonoscopy in 5 years for screening                            purposes. Mauri Pole, MD 04/25/2019 8:50:25 AM This report has been signed electronically.

## 2019-04-29 ENCOUNTER — Telehealth: Payer: Self-pay

## 2019-04-29 NOTE — Telephone Encounter (Signed)
  Follow up Call-  Call back number 04/25/2019 03/29/2018 01/18/2017  Post procedure Call Back phone  # [phone]  Permission to leave phone message Yes Yes Yes  Some recent data might be hidden     Patient questions:  Do you have a fever, pain , or abdominal swelling? No. Pain Score  0 *  Have you tolerated food without any problems? Yes.    Have you been able to return to your normal activities? Yes.    Do you have any questions about your discharge instructions: Diet   No. Medications  No. Follow up visit  No.  Do you have questions or concerns about your Care? No.  Actions: * If pain score is 4 or above: 1. No action needed, pain <4.Have you developed a fever since your procedure? no  2.   Have you had an respiratory symptoms (SOB or cough) since your procedure? no  3.   Have you tested positive for COVID 19 since your procedure no  4.   Have you had any family members/close contacts diagnosed with the COVID 19 since your procedure?  no   If yes to any of these questions please route to Joylene John, RN and Alphonsa Gin, Therapist, sports.

## 2019-05-02 ENCOUNTER — Encounter: Payer: 59 | Attending: Family Medicine | Admitting: Dietician

## 2019-05-02 ENCOUNTER — Other Ambulatory Visit: Payer: Self-pay

## 2019-05-02 ENCOUNTER — Encounter: Payer: Self-pay | Admitting: Dietician

## 2019-05-02 DIAGNOSIS — E119 Type 2 diabetes mellitus without complications: Secondary | ICD-10-CM | POA: Diagnosis present

## 2019-05-02 NOTE — Progress Notes (Signed)
Diabetes Self-Management Education  Visit Type: First/Initial  Appt. Start Time: 1040 Appt. End Time: 1210  05/02/2019  [name], identified by name and date of birth, is a 63 y.o. female with a diagnosis of Diabetes: Type 2.   ASSESSMENT   Patient is here today with newly diagnosed idabetes with A1C of 6.8% in August 2020. Other history includes HTN HLD, chronic headache, problems sleeping (wakes every 2 hours), joint problems, and history of colon cancer at age 50 treated with chemo.  Before diabetes diagnosis, she was eating cereal or granola bar for breakfast, Sub or Salad for lunch and increased pasta or potatoes, fried fish or baked chicken, bread.  Overall she states that she did eat quite balanced meals. Weight slowly crept up and was 160 lbs in August and has lost to 157 lbs today.  Patient lives with her husband.  They share shopping and cooking.   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  She states that she is very busy currently. She is from [place] and has been here for 30 years and got her citizenship in January 2020.  Height 5\' 3"  (1.6 m), weight 157 lb (71.2 kg). Body mass index is 27.81 kg/m.  Diabetes Self-Management Education - 05/02/19 1053      Visit Information   Visit Type  First/Initial      Initial Visit   Diabetes Type  Type 2    Are you currently following a meal plan?  No    Are you taking your medications as prescribed?  Not on Medications    Date Diagnosed  02/2019      Health Coping   How would you rate your overall health?  Fair      Psychosocial Assessment   Patient Belief/Attitude about Diabetes  Motivated to manage diabetes    Self-care barriers  None    Self-management support  Doctor's office;Family    Other persons present  Patient    Patient Concerns  Nutrition/Meal planning;Glycemic Control    Special Needs  None    Preferred Learning Style  No preference indicated    Learning  Readiness  Ready    How often do you need to have someone help you when you read instructions, pamphlets, or other written materials from your doctor or pharmacy?  1 - Never    What is the last grade level you completed in school?  4 years college      Pre-Education Assessment   Patient understands the diabetes disease and treatment process.  Needs Instruction    Patient understands incorporating nutritional management into lifestyle.  Needs Instruction    Patient undertands incorporating physical activity into lifestyle.  Needs Instruction    Patient understands using medications safely.  Needs Instruction    Patient understands monitoring blood glucose, interpreting and using results  Needs Instruction    Patient understands prevention, detection, and treatment of acute complications.  Needs Instruction    Patient understands prevention, detection, and treatment of chronic complications.  Needs Instruction    Patient understands how to develop strategies to address psychosocial issues.  Needs Instruction    Patient understands how to develop strategies to promote health/change behavior.  Needs Instruction      Complications   Last HgB A1C per patient/outside source  6.8 %   02/2019   How often do you check your blood sugar?  Not recommended by provider    Have you had a dilated eye  exam in the past 12 months?  No    Have you had a dental exam in the past 12 months?  Yes    Are you checking your feet?  Yes    How many days per week are you checking your feet?  7      Dietary Intake   Breakfast  steel cut oatmeal OR egg bites (eggs and spinach) OR plain cheerios skim milk    Snack (morning)  none    Lunch  salad, avocado, baked chicken or tuna salad or ham    Snack (afternoon)  berries or hummus with baked tortilla chips, beer    Dinner  salmon or chicken, spaghetti squash, parmesan cheese, vegetables   9   Snack (evening)  none    Beverage(s)  water, hot tea with skim milk, 2-3 Miller  Lite beer      Exercise   Exercise Type  Light (walking / raking leaves)    How many days per week to you exercise?  7    How many minutes per day do you exercise?  20    Total minutes per week of exercise  140      Patient Education   Previous Diabetes Education  No    Disease state   Definition of diabetes, type 1 and 2, and the diagnosis of diabetes;Factors that contribute to the development of diabetes;Explored patient's options for treatment of their diabetes    Nutrition management   Role of diet in the treatment of diabetes and the relationship between the three main macronutrients and blood glucose level;Food label reading, portion sizes and measuring food.;Carbohydrate counting;Meal options for control of blood glucose level and chronic complications.    Physical activity and exercise   Role of exercise on diabetes management, blood pressure control and cardiac health.;Helped patient identify appropriate exercises in relation to his/her diabetes, diabetes complications and other health issue.    Monitoring  Daily foot exams;Yearly dilated eye exam;Interpreting lab values - A1C, lipid, urine microalbumina.;Taught/discussed recording of test results and interpretation of SMBG.    Acute complications  Taught treatment of hypoglycemia - the 15 rule.    Chronic complications  Dental care;Retinopathy and reason for yearly dilated eye exams;Identified and discussed with patient  current chronic complications;Relationship between chronic complications and blood glucose control    Psychosocial adjustment  Worked with patient to identify barriers to care and solutions      Individualized Goals (developed by patient)   Nutrition  General guidelines for healthy choices and portions discussed    Physical Activity  Exercise 5-7 days per week;30 minutes per day    Medications  Not Applicable    Monitoring   Not Applicable      Post-Education Assessment   Patient understands the diabetes disease  and treatment process.  Demonstrates understanding / competency    Patient understands incorporating nutritional management into lifestyle.  Demonstrates understanding / competency    Patient undertands incorporating physical activity into lifestyle.  Demonstrates understanding / competency    Patient understands using medications safely.  Demonstrates understanding / competency    Patient understands monitoring blood glucose, interpreting and using results  Demonstrates understanding / competency    Patient understands prevention, detection, and treatment of acute complications.  Demonstrates understanding / competency    Patient understands prevention, detection, and treatment of chronic complications.  Demonstrates understanding / competency    Patient understands how to develop strategies to address psychosocial issues.  Demonstrates understanding / competency  Patient understands how to develop strategies to promote health/change behavior.  Demonstrates understanding / competency      Outcomes   Expected Outcomes  Demonstrated interest in learning. Expect positive outcomes    Future DMSE  PRN    Program Status  Completed       Individualized Plan for Diabetes Self-Management Training:   Learning Objective:  Patient will have a greater understanding of diabetes self-management. Patient education plan is to attend individual and/or group sessions per assessed needs and concerns.   Plan:   Patient Instructions  Plan:  Aim for 2-3 Carb Choices per meal (30-45 grams) +/- 1 either way  Aim for 0-1 Carbs per snack if hungry  Include protein in moderation with your meals and snacks Consider reading food labels for Total Carbohydrate of foods Consider  increasing your activity level by walking or elyptical or bike or armchair exercise for 30 minutes daily as tolerated Continue checking BG at alternate times per day       Expected Outcomes:  Demonstrated interest in learning. Expect  positive outcomes  Education material provided: ADA - How to Thrive: A Guide for Your Journey with Diabetes, Food label handouts, Meal plan card and Snack sheet, Diabetes resources  If problems or questions, patient to contact team via:  Phone and Email  Future DSME appointment: PRN

## 2019-05-02 NOTE — Patient Instructions (Signed)
Plan:  Aim for 2-3 Carb Choices per meal (30-45 grams) +/- 1 either way  Aim for 0-1 Carbs per snack if hungry  Include protein in moderation with your meals and snacks Consider reading food labels for Total Carbohydrate of foods Consider  increasing your activity level by walking or elyptical or bike or armchair exercise for 30 minutes daily as tolerated Continue checking BG at alternate times per day

## 2019-05-07 NOTE — Addendum Note (Signed)
Addended by: Beckey Rutter on: 05/07/2019 01:42 PM   Modules accepted: Orders

## 2019-05-08 ENCOUNTER — Telehealth: Payer: Self-pay | Admitting: Cardiology

## 2019-05-08 ENCOUNTER — Encounter: Payer: Self-pay | Admitting: *Deleted

## 2019-05-08 DIAGNOSIS — R931 Abnormal findings on diagnostic imaging of heart and coronary circulation: Secondary | ICD-10-CM

## 2019-05-08 HISTORY — DX: Abnormal findings on diagnostic imaging of heart and coronary circulation: R93.1

## 2019-05-08 LAB — BASIC METABOLIC PANEL
BUN/Creatinine Ratio: 12 (ref 12–28)
BUN: 8 mg/dL (ref 8–27)
CO2: 23 mmol/L (ref 20–29)
Calcium: 9.3 mg/dL (ref 8.7–10.3)
Chloride: 91 mmol/L — ABNORMAL LOW (ref 96–106)
Creatinine, Ser: 0.65 mg/dL (ref 0.57–1.00)
GFR calc Af Amer: 110 mL/min/{1.73_m2} (ref >59)
GFR calc non Af Amer: 95 mL/min/{1.73_m2} (ref >59)
Glucose: 154 mg/dL — ABNORMAL HIGH (ref 65–99)
Potassium: 4 mmol/L (ref 3.5–5.2)
Sodium: 129 mmol/L — ABNORMAL LOW (ref 134–144)

## 2019-05-08 NOTE — Telephone Encounter (Signed)
Please check on CT scheduling.  

## 2019-05-08 NOTE — Telephone Encounter (Signed)
Patient informed of lab results and advised that Kathlene November at the [facility] will be contacting her to schedule her CT chest with contrast this afternoon. Patient is agreeable to plan and verbalized understanding. No further questions.

## 2019-05-16 ENCOUNTER — Other Ambulatory Visit: Payer: Self-pay

## 2019-05-16 ENCOUNTER — Encounter (HOSPITAL_BASED_OUTPATIENT_CLINIC_OR_DEPARTMENT_OTHER): Payer: Self-pay

## 2019-05-16 ENCOUNTER — Ambulatory Visit (HOSPITAL_BASED_OUTPATIENT_CLINIC_OR_DEPARTMENT_OTHER): Admission: RE | Admit: 2019-05-16 | Payer: 59 | Source: Ambulatory Visit

## 2019-05-17 ENCOUNTER — Other Ambulatory Visit (HOSPITAL_BASED_OUTPATIENT_CLINIC_OR_DEPARTMENT_OTHER): Payer: Self-pay | Admitting: Cardiology

## 2019-05-17 ENCOUNTER — Other Ambulatory Visit: Payer: Self-pay | Admitting: *Deleted

## 2019-05-17 DIAGNOSIS — I712 Thoracic aortic aneurysm, without rupture, unspecified: Secondary | ICD-10-CM

## 2019-05-18 ENCOUNTER — Ambulatory Visit (HOSPITAL_BASED_OUTPATIENT_CLINIC_OR_DEPARTMENT_OTHER)
Admission: RE | Admit: 2019-05-18 | Discharge: 2019-05-18 | Disposition: A | Discharge status: Home / Self Care | Payer: 59 | Source: Ambulatory Visit | Attending: Cardiology | Admitting: Cardiology

## 2019-05-18 ENCOUNTER — Encounter (HOSPITAL_BASED_OUTPATIENT_CLINIC_OR_DEPARTMENT_OTHER): Payer: Self-pay

## 2019-05-18 ENCOUNTER — Other Ambulatory Visit: Payer: Self-pay

## 2019-05-18 DIAGNOSIS — I712 Thoracic aortic aneurysm, without rupture: Secondary | ICD-10-CM | POA: Diagnosis not present

## 2019-05-18 MED ORDER — IOHEXOL 350 MG/ML SOLN
100.0000 mL | Freq: Once | INTRAVENOUS | Status: AC | PRN
  Administered 2019-05-18: 100 mL via INTRAVENOUS

## 2019-05-20 ENCOUNTER — Other Ambulatory Visit (HOSPITAL_BASED_OUTPATIENT_CLINIC_OR_DEPARTMENT_OTHER): Payer: 59

## 2019-05-23 ENCOUNTER — Telehealth: Payer: Self-pay

## 2019-05-23 DIAGNOSIS — I712 Thoracic aortic aneurysm, without rupture, unspecified: Secondary | ICD-10-CM

## 2019-05-23 NOTE — Telephone Encounter (Signed)
-----   Message from Richardo Priest, MD sent at 05/20/2019  8:16 AM EST ----- Normal or stable result  She has aneurysm of her thoracic aorta the last comparison CT scan was 2008 its not severe she does not need to see a surgeon but is the reason for leakage across the valve by stretching the frame of her aortic valve.  Follow-up CTA 6 months please schedule and see me 1 week afterwards.

## 2019-05-23 NOTE — Telephone Encounter (Signed)
Patient informed of results.  Order for CTA-Chest entered in system. Patient will have testing and see Dr Bettina Gavia one week later.  Patient agreed to plan and verbalized understanding.  No further questions.

## 2019-05-30 LAB — HM MAMMOGRAPHY

## 2019-06-28 ENCOUNTER — Encounter: Payer: Self-pay | Admitting: Family Medicine

## 2019-06-28 DIAGNOSIS — R519 Headache, unspecified: Secondary | ICD-10-CM

## 2019-07-17 ENCOUNTER — Other Ambulatory Visit: Payer: Self-pay | Admitting: Gastroenterology

## 2019-07-26 ENCOUNTER — Other Ambulatory Visit: Payer: Self-pay | Admitting: Specialist

## 2019-07-26 DIAGNOSIS — M542 Cervicalgia: Secondary | ICD-10-CM

## 2019-07-26 DIAGNOSIS — G8929 Other chronic pain: Secondary | ICD-10-CM

## 2019-07-26 DIAGNOSIS — R519 Headache, unspecified: Secondary | ICD-10-CM

## 2019-08-06 ENCOUNTER — Other Ambulatory Visit: Payer: Self-pay

## 2019-08-06 ENCOUNTER — Ambulatory Visit
Admission: RE | Admit: 2019-08-06 | Discharge: 2019-08-06 | Disposition: A | Discharge status: Home / Self Care | Payer: 59 | Source: Ambulatory Visit | Attending: Specialist | Admitting: Specialist

## 2019-08-06 DIAGNOSIS — R519 Headache, unspecified: Secondary | ICD-10-CM

## 2019-08-06 DIAGNOSIS — G8929 Other chronic pain: Secondary | ICD-10-CM

## 2019-09-06 ENCOUNTER — Ambulatory Visit: Payer: 59 | Admitting: Family Medicine

## 2019-09-06 ENCOUNTER — Encounter: Payer: Self-pay | Admitting: Family Medicine

## 2019-09-06 ENCOUNTER — Other Ambulatory Visit: Payer: Self-pay

## 2019-09-06 VITALS — BP 137/77 | HR 85 | Temp 97.6°F | Ht 63.0 in | Wt 147.0 lb

## 2019-09-06 DIAGNOSIS — I10 Essential (primary) hypertension: Secondary | ICD-10-CM | POA: Diagnosis not present

## 2019-09-06 DIAGNOSIS — R350 Frequency of micturition: Secondary | ICD-10-CM

## 2019-09-06 DIAGNOSIS — E7800 Pure hypercholesterolemia, unspecified: Secondary | ICD-10-CM | POA: Diagnosis not present

## 2019-09-06 DIAGNOSIS — R7303 Prediabetes: Secondary | ICD-10-CM

## 2019-09-06 LAB — CMP14+EGFR
ALT: 20 IU/L (ref 0–32)
AST: 18 IU/L (ref 0–40)
Albumin/Globulin Ratio: 1.7 (ref 1.2–2.2)
Albumin: 4.5 g/dL (ref 3.8–4.8)
Alkaline Phosphatase: 68 IU/L (ref 39–117)
BUN/Creatinine Ratio: 13 (ref 12–28)
BUN: 9 mg/dL (ref 8–27)
Bilirubin Total: 0.6 mg/dL (ref 0.0–1.2)
CO2: 22 mmol/L (ref 20–29)
Calcium: 9.5 mg/dL (ref 8.7–10.3)
Chloride: 95 mmol/L — ABNORMAL LOW (ref 96–106)
Creatinine, Ser: 0.67 mg/dL (ref 0.57–1.00)
GFR calc Af Amer: 108 mL/min/{1.73_m2} (ref >59)
GFR calc non Af Amer: 94 mL/min/{1.73_m2} (ref >59)
Globulin, Total: 2.6 g/dL (ref 1.5–4.5)
Glucose: 110 mg/dL — ABNORMAL HIGH (ref 65–99)
Potassium: 4.4 mmol/L (ref 3.5–5.2)
Sodium: 133 mmol/L — ABNORMAL LOW (ref 134–144)
Total Protein: 7.1 g/dL (ref 6.0–8.5)

## 2019-09-06 LAB — LIPID PANEL
Chol/HDL Ratio: 2 ratio (ref 0.0–4.4)
Cholesterol, Total: 145 mg/dL (ref 100–199)
HDL: 74 mg/dL (ref >39)
LDL Chol Calc (NIH): 55 mg/dL (ref 0–99)
Triglycerides: 82 mg/dL (ref 0–149)
VLDL Cholesterol Cal: 16 mg/dL (ref 5–40)

## 2019-09-06 LAB — POCT URINALYSIS DIP (MANUAL ENTRY)
Bilirubin, UA: NEGATIVE
Blood, UA: NEGATIVE
Glucose, UA: NEGATIVE mg/dL
Ketones, POC UA: NEGATIVE mg/dL
Leukocytes, UA: NEGATIVE
Nitrite, UA: NEGATIVE
Protein Ur, POC: NEGATIVE mg/dL
Spec Grav, UA: 1.01 (ref 1.010–1.025)
Urobilinogen, UA: 0.2 E.U./dL
pH, UA: 6 (ref 5.0–8.0)

## 2019-09-06 LAB — HEMOGLOBIN A1C
Est. average glucose Bld gHb Est-mCnc: 114 mg/dL
Hgb A1c MFr Bld: 5.6 % (ref 4.8–5.6)

## 2019-09-06 NOTE — Patient Instructions (Signed)
° ° ° °  If you have lab work done today you will be contacted with your lab results within the next 2 weeks.  If you have not heard from us then please contact us. The fastest way to get your results is to register for My Chart. ° ° °IF you received an x-ray today, you will receive an invoice from [facility]. Please contact [facility] at [phone] with questions or concerns regarding your invoice.  ° °IF you received labwork today, you will receive an invoice from [facility]. Please contact [facility] at [phone] with questions or concerns regarding your invoice.  ° °Our billing staff will not be able to assist you with questions regarding bills from these companies. ° °You will be contacted with the lab results as soon as they are available. The fastest way to get your results is to activate your My Chart account. Instructions are located on the last page of this paperwork. If you have not heard from us regarding the results in 2 weeks, please contact this office. °  ° ° ° °

## 2019-09-06 NOTE — Progress Notes (Signed)
2/26/202110:05 AM  [name] [name] Apr 05, 1956, 64 y.o., female [id number]  Chief Complaint  Patient presents with  . Follow-up    wants to talk about whether she should have her labs done today being that she has eaten this morning    HPI:   Patient is a 64 y.o. female with past medical history significant for colon cancer, chronic constipation, HTN, HLP, DDD of cervical spine with radiculopathy who presents today for routine followup  Last OV Aug 2020 - referred to headache specialist [facility], headaches thought to be 2/2 cervical spine DDD, started on gabapentin, doing much better Had yearly colonoscopy in oct 2020, normal, next due in 5 years Echo oct 2020 LVEF wnl, mod MR and TR, thoracic aortic aneurysm seen, had CTA done in nov 2020, managed by cards  Today she reports she is doing well She is taking all her meds as prescribed  Has been using sedentary bike, aims for 30 minutes a day Limited in walking due to multiple joint pains 2/2 chemo Has meet with nutritionist since our last OV, has eliminated "white foods" Has lost weight Has noticed polyuria and has noticed increased hunger with occasional signs of hypoglycemia Intermittent urge, nocturia x 3-4, no dysuria or hematuria Drinks tons of water during the day and 2 light beers at night  Wt Readings from Last 3 Encounters:  09/06/19 147 lb (66.7 kg)  05/02/19 157 lb (71.2 kg)  04/25/19 158 lb (71.7 kg)    Lab Results  Component Value Date   HGBA1C 6.8 (H) 03/05/2019   HGBA1C 6.3 (H) 02/14/2018   Lab Results  Component Value Date   LDLCALC 75 03/05/2019   CREATININE 0.65 05/07/2019    Depression screen PHQ 2/9 05/02/2019 03/05/2019 02/14/2018  Decreased Interest 0 0 0  Down, Depressed, Hopeless 0 0 0  PHQ - 2 Score 0 0 0  Altered sleeping - - -  Tired, decreased energy - - -  Change in appetite - - -  Feeling bad or failure about yourself  - - -  Trouble concentrating - - -  Moving  slowly or fidgety/restless - - -  Suicidal thoughts - - -  PHQ-9 Score - - -  Difficult doing work/chores - - -    Fall Risk  09/06/2019 05/02/2019 03/05/2019 02/14/2018 10/09/2017  Falls in the past year? 0 0 0 No No  Number falls in past yr: 0 - 0 - -  Injury with Fall? 0 - 0 - -     No Known Allergies  Prior to Admission medications   Medication Sig Start Date End Date Taking? Authorizing Provider  amoxicillin (AMOXIL) 500 MG tablet Take 4 tablets (2,00 mg) 45 minutes prior to any dental cleanings or procedures. 03/20/19  Yes Richardo Priest, MD  Artificial Tear Ointment (DRY EYES OP) Apply to eye as needed.   Yes [provider]  fluticasone (FLONASE) 50 MCG/ACT nasal spray Place 1 spray into both nostrils 2 (two) times daily. 03/05/19  Yes Rutherford Guys, MD  gabapentin (NEURONTIN) 300 MG capsule  09/02/19  Yes [provider]  LINZESS 72 MCG capsule TAKE 1 CAPSULE (72 MCG TOTAL) BY MOUTH DAILY BEFORE BREAKFAST. 07/17/19  Yes Nandigam, Venia Minks, MD  lisinopril-hydrochlorothiazide (ZESTORETIC) 20-12.5 MG tablet Take 1 tablet by mouth daily. 04/09/19  Yes Richardo Priest, MD  meloxicam (MOBIC) 15 MG tablet Take 1 tablet (15 mg total) by mouth daily. 03/05/19  Yes Grant Fontana M,  MD  Multiple Vitamins-Minerals (MULTIVITAMIN ADULT PO) Take by mouth daily.   Yes [provider]  pravastatin (PRAVACHOL) 80 MG tablet TAKE 1 TABLET BY MOUTH EVERY DAY 04/08/19  Yes Richardo Priest, MD    Past Medical History:  Diagnosis Date  . Anemia    hx of, not at this time  . Anxiety    no meds  . Aortic regurgitation   . Arthritis    "right hip; left hip; most of my joints" (05/27/2014)  . Depression   . Diverticulosis   . Essential hypertension, benign    takes Lisinopril-HCTZ daily  . Heart murmur   . High cholesterol    takes Pravastatin daily  . History of blood transfusion    no abnormal reaction noted 2015  . History of colon polyps    benign  . Insomnia     doesn't take any meds  . Joint pain   . Neuromuscular disorder ([place])   . Neuropathy    since chemo and radiation  . Nocturia   . PONV (postoperative nausea and vomiting)    hallucinations  . Rectosigmoid cancer ([place]) 2004    S/P Surgery, Chemo, Rad Tx,  (after chemo treatment menstral periods have ceased).    Past Surgical History:  Procedure Laterality Date  . ANAL RECTAL MANOMETRY N/A 07/17/2017   Procedure: ANO RECTAL MANOMETRY;  Surgeon: Mauri Pole, MD;  Location: [facility];  Service: Endoscopy;  Laterality: N/A;  . COLECTOMY  Aug 2004   S/P Low Anterior Resxn (Sigmoid Colon)  . COLONOSCOPY    . DILATION AND CURETTAGE OF UTERUS  1991  . LUMBAR DISC SURGERY  Feb 2007  . TOTAL HIP ARTHROPLASTY Right 05/27/2014   Procedure: RIGHT TOTAL HIP ARTHROPLASTY ANTERIOR APPROACH;  Surgeon: Hessie Dibble, MD;  Location: [facility];  Service: Orthopedics;  Laterality: Right;  . TOTAL HIP ARTHROPLASTY Left 08/25/2015   Procedure: TOTAL HIP ARTHROPLASTY ANTERIOR APPROACH;  Surgeon: Melrose Nakayama, MD;  Location: [facility];  Service: Orthopedics;  Laterality: Left;    Social History   Tobacco Use  . Smoking status: Former Smoker    Types: Cigarettes  . Smokeless tobacco: Never Used  . Tobacco comment: "smoked some socially in my college days"  Substance Use Topics  . Alcohol use: Yes    Comment: occasionally beer/wine    Family History  Adopted: Yes    Review of Systems  Constitutional: Negative for chills and fever.  Respiratory: Negative for cough and shortness of breath.   Cardiovascular: Negative for chest pain, palpitations and leg swelling.  Gastrointestinal: Negative for abdominal pain, nausea and vomiting.  per hpi   OBJECTIVE:  Today's Vitals   09/06/19 0951  BP: 137/77  Pulse: 85  Temp: 97.6 F (36.4 C)  SpO2: 100%  Weight: 147 lb (66.7 kg)  Height: 5' 3"  (1.6 m)   Body mass index is 26.04 kg/m.   Physical Exam Vitals and nursing note reviewed.    Constitutional:      Appearance: She is well-developed.  HENT:     Head: Normocephalic and atraumatic.     Mouth/Throat:     Pharynx: No oropharyngeal exudate.  Eyes:     General: No scleral icterus.    Conjunctiva/sclera: Conjunctivae normal.     Pupils: Pupils are equal, round, and reactive to light.  Cardiovascular:     Rate and Rhythm: Normal rate and regular rhythm.     Heart sounds: Normal heart sounds. No murmur. No  friction rub. No gallop.   Pulmonary:     Effort: Pulmonary effort is normal.     Breath sounds: Normal breath sounds. No wheezing or rales.  Musculoskeletal:     Cervical back: Neck supple.     Right lower leg: No edema.     Left lower leg: No edema.  Skin:    General: Skin is warm and dry.  Neurological:     Mental Status: She is alert and oriented to person, place, and time.     Results for orders placed or performed in visit on 09/06/19 (from the past 24 hour(s))  POCT urinalysis dipstick     Status: None   Collection Time: 09/06/19 10:48 AM  Result Value Ref Range   Color, UA yellow yellow   Clarity, UA clear clear   Glucose, UA negative negative mg/dL   Bilirubin, UA negative negative   Ketones, POC UA negative negative mg/dL   Spec Grav, UA 1.010 1.010 - 1.025   Blood, UA negative negative   pH, UA 6.0 5.0 - 8.0   Protein Ur, POC negative negative mg/dL   Urobilinogen, UA 0.2 0.2 or 1.0 E.U./dL   Nitrite, UA Negative Negative   Leukocytes, UA Negative Negative    No results found.   ASSESSMENT and PLAN  1. Essential hypertension Controlled. Continue current regime.   2. Prediabetes Last a1c was in diabetic range. Since then patient has made sign LFM. a1c pending today. Discussed importance of fiber and protein for sugar and hunger stabilization. - Hemoglobin A1c  3. Elevated cholesterol Labs pending, managed by cards - Lipid panel - CMP14+EGFR  4. Urinary frequency UA normal. Discussed cutting back evening fluids incl etoh.  Consider starting oxybutynin at bedtime. - POCT urinalysis dipstick  Other orders  Return in about 6 months (around 03/05/2020).    Rutherford Guys, MD [facility] [place], [place] [address] Ph.  [phone] Fax [phone]

## 2019-10-10 ENCOUNTER — Telehealth: Payer: Self-pay | Admitting: *Deleted

## 2019-10-10 MED ORDER — SIMVASTATIN 40 MG PO TABS: 40.0000 mg | ORAL_TABLET | Freq: Every day | ORAL | 3 refills | Status: DC

## 2019-10-10 NOTE — Telephone Encounter (Signed)
Per Dr Joya Gaskins verbal order stop pravastatin and start Simvastatin 40 mg take one tablet by mouth every day.   Orders placed and [facility] to [facility] per request.

## 2019-10-10 NOTE — Telephone Encounter (Signed)
[facility]ed [facility] request to change Pravastatin to Simvastatin due to cost per pt. Please advise and call in medication

## 2019-11-02 ENCOUNTER — Encounter: Payer: Self-pay | Admitting: Cardiology

## 2019-11-04 ENCOUNTER — Telehealth: Payer: Self-pay | Admitting: Cardiology

## 2019-11-04 NOTE — Telephone Encounter (Signed)
Spoke to the patient and I let her know that I am sending this message over to scheduling. She needs to be scheduled for a chest CTA in [place] [facility] before her follow up appointment in June.

## 2019-11-04 NOTE — Telephone Encounter (Signed)
New Message     Pt is calling and says she has been waiting for someone to contact her about setting up a CT or MRI  Pt says she is not sure which one she is suppose to have done and is wanting to get it scheduled     Please advise

## 2019-11-18 ENCOUNTER — Other Ambulatory Visit: Payer: Self-pay | Admitting: Gastroenterology

## 2019-12-10 ENCOUNTER — Telehealth: Payer: Self-pay | Admitting: Cardiology

## 2019-12-10 DIAGNOSIS — I7789 Other specified disorders of arteries and arterioles: Secondary | ICD-10-CM

## 2019-12-10 DIAGNOSIS — I10 Essential (primary) hypertension: Secondary | ICD-10-CM

## 2019-12-10 NOTE — Telephone Encounter (Signed)
New Message   Patient is calling to check on the status of scheduling chest CTA. Please call to discuss.

## 2019-12-10 NOTE — Telephone Encounter (Signed)
Spoke to the patient just now and let her know that she would need to get lab work done prior to this chest CTA. She said she would come in on Friday to get this done. She also asked that I please cancel her appointment with Dr. Bettina Gavia on 12/18/19. This was done and she does not want to reschedule at this time until the chest CTA is completed. She states that she will call back to reschedule.

## 2019-12-17 ENCOUNTER — Ambulatory Visit: Payer: 59 | Admitting: Cardiology

## 2019-12-18 ENCOUNTER — Ambulatory Visit: Payer: 59 | Admitting: Cardiology

## 2019-12-27 ENCOUNTER — Telehealth: Payer: Self-pay

## 2019-12-27 LAB — BASIC METABOLIC PANEL
BUN/Creatinine Ratio: 21 (ref 12–28)
BUN: 13 mg/dL (ref 8–27)
CO2: 21 mmol/L (ref 20–29)
Calcium: 9.4 mg/dL (ref 8.7–10.3)
Chloride: 97 mmol/L (ref 96–106)
Creatinine, Ser: 0.63 mg/dL (ref 0.57–1.00)
GFR calc Af Amer: 110 mL/min/{1.73_m2} (ref >59)
GFR calc non Af Amer: 96 mL/min/{1.73_m2} (ref >59)
Glucose: 99 mg/dL (ref 65–99)
Potassium: 4.6 mmol/L (ref 3.5–5.2)
Sodium: 134 mmol/L (ref 134–144)

## 2019-12-27 NOTE — Telephone Encounter (Signed)
-----   Message from Truddie Hidden, RN sent at 12/27/2019  3:54 PM EDT ----- Regarding: CT Can you call this pt and give her an update on her CT scan? ----- Message ----- From: Richardo Priest, MD Sent: 12/27/2019   1:54 PM EDT To: [facility] Triage  Normal or stable result  No changes

## 2019-12-27 NOTE — Telephone Encounter (Signed)
Spoke with the patient just now and let her know that I have sent a message over to scheduling for her to have a chest CTA scheduled at [place]. She verbalizes understanding and I let her know that she should hear something from [name] on Monday or Tuesday of next week. No other issues or concerns were noted at this time.   Encouraged patient to call back with any questions or concerns.

## 2020-01-02 ENCOUNTER — Telehealth: Payer: Self-pay

## 2020-01-02 ENCOUNTER — Encounter (HOSPITAL_BASED_OUTPATIENT_CLINIC_OR_DEPARTMENT_OTHER): Payer: Self-pay

## 2020-01-02 ENCOUNTER — Ambulatory Visit (HOSPITAL_BASED_OUTPATIENT_CLINIC_OR_DEPARTMENT_OTHER)
Admission: RE | Admit: 2020-01-02 | Discharge: 2020-01-02 | Disposition: A | Discharge status: Home / Self Care | Payer: 59 | Source: Ambulatory Visit | Attending: Cardiology | Admitting: Cardiology

## 2020-01-02 ENCOUNTER — Other Ambulatory Visit: Payer: Self-pay

## 2020-01-02 DIAGNOSIS — I712 Thoracic aortic aneurysm, without rupture, unspecified: Secondary | ICD-10-CM

## 2020-01-02 MED ORDER — IOHEXOL 350 MG/ML SOLN
100.0000 mL | Freq: Once | INTRAVENOUS | Status: AC | PRN
  Administered 2020-01-02: 100 mL via INTRAVENOUS

## 2020-01-02 NOTE — Telephone Encounter (Signed)
Spoke with patient regarding results and recommendation.  Patient verbalizes understanding and is agreeable to plan of care. Advised patient to call back with any issues or concerns.  

## 2020-01-02 NOTE — Telephone Encounter (Signed)
-----   Message from Richardo Priest, MD sent at 01/02/2020 11:44 AM EDT ----- Stable, I think we can wait 1 year to repeat.

## 2020-01-18 NOTE — Progress Notes (Signed)
Cardiology Office Note:    Date:  01/20/2020   ID:  [name], DOB Jul 20, 1955, MRN [id number]  PCP:  Rutherford Guys, MD  Cardiologist:  Shirlee More, MD    Referring MD: Rutherford Guys, MD    ASSESSMENT:    1. Thoracic aortic aneurysm without rupture ([name])   2. Aortic valve insufficiency, etiology of cardiac valve disease unspecified   3. Essential hypertension   4. Aortic atherosclerosis ([name])   5. Coronary artery calcification seen on CAT scan   6. Mixed hyperlipidemia    PLAN:    In order of problems listed above:  1. Her thoracic aortopathy is stable I think we can wait 1 year for repeat especially good blood pressure control.  Follow-up CTA ordered June 2022 and see me afterwards.  If she requires intervention for thoracic aorta she need aortic valve replacement at the same time. 2. Stable aortic regurgitation will review if she needs an echocardiogram next visit 3. BP at target continue current treatment goal less than 120/80 4. Continue statin with coronary artery calcification aortic atherosclerosis lipids are at target   Next appointment: 1 year   Medication Adjustments/Labs and Tests Ordered: Current medicines are reviewed at length with the patient today.  Concerns regarding medicines are outlined above.  Orders Placed This Encounter  Procedures  . CT ANGIO CHEST AORTA W/CM & OR WO/CM  . Basic metabolic panel  . EKG 12-Lead   No orders of the defined types were placed in this encounter.   Chief Complaint  Patient presents with  . Follow-up    Aneurysm ascending aorta  . Aortic Insuffiency    History of Present Illness:    [name] [name] is a 64 y.o. female with a hx of  colon cancer age 32 treated with surgery chemotherapy and radiation hypertension and dyslipidemia last seen by Wynonia Lawman.  Echocardiogram 2017 showed normal aorta and aortic valve moderate regurgitation normal left ventricular size function EF estimated at 60%.  She was last seen  03/19/2020.  Echocardiogram performed after that visit showed normal left ventricular size mild LVH normal left ventricular systolic function and grade 1 diastolic dysfunction.  There was concern the aortic valve was possibly bicuspid and there was moderate aortic regurgitation.  The ascending aorta was aneurysmal measuring 44 mm.  Compliance with diet, lifestyle and medications: Yes  I reviewed the results of your thoracic CTA with the patient.  She has coronary calcification and thoracic atherosclerosis is on a statin lipids are at target.  She has had no progression in aneurysm ascending aorta.  She is unaware that she had hepatic steatosis and has no history of cirrhosis.  She tolerates her statin without muscle pain or weakness and has had no cardiovascular symptoms of chest pain shortness of breath palpitation or syncope Home blood pressures run less than 120/80 and repeat blood pressure by me in my office 128/70.  Follow-up CTA thoracic aorta performed 01/02/2020 showed aneurysm proximal ascending aorta measuring 46 mm unchanged from November 2020.  She also had coronary artery calcification and calcification of the thoracic aorta.  There is hepatic steatosis and a question of early changes of liver cirrhosis.  A comprehensive metabolic panel 28/78/6767 showed normal liver function test.  His CT of the abdomen in 2008 showed findings of hepatic steatosis. Past Medical History:  Diagnosis Date  . Anemia    hx of, not at this time  . Anxiety    no meds  . Aortic regurgitation   .  Arthritis    "right hip; left hip; most of my joints" (05/27/2014)  . Depression   . Diverticulosis   . Essential hypertension, benign    takes Lisinopril-HCTZ daily  . Heart murmur   . High cholesterol    takes Pravastatin daily  . History of blood transfusion    no abnormal reaction noted 2015  . History of colon polyps    benign  . Insomnia    doesn't take any meds  . Joint pain   . Neuromuscular  disorder ([place])   . Neuropathy    since chemo and radiation  . Nocturia   . PONV (postoperative nausea and vomiting)    hallucinations  . Rectosigmoid cancer ([name]) 2004    S/P Surgery, Chemo, Rad Tx,  (after chemo treatment menstral periods have ceased).    Past Surgical History:  Procedure Laterality Date  . ANAL RECTAL MANOMETRY N/A 07/17/2017   Procedure: ANO RECTAL MANOMETRY;  Surgeon: Mauri Pole, MD;  [facility];  Service: Endoscopy;  Laterality: N/A;  . COLECTOMY  Aug 2004   S/P Low Anterior Resxn (Sigmoid Colon)  . COLONOSCOPY    . DILATION AND CURETTAGE OF UTERUS  1991  . LUMBAR DISC SURGERY  Feb 2007  . TOTAL HIP ARTHROPLASTY Right 05/27/2014   Procedure: RIGHT TOTAL HIP ARTHROPLASTY ANTERIOR APPROACH;  Surgeon: Hessie Dibble, MD;  Location: [place];  Service: Orthopedics;  Laterality: Right;  . TOTAL HIP ARTHROPLASTY Left 08/25/2015   Procedure: TOTAL HIP ARTHROPLASTY ANTERIOR APPROACH;  Surgeon: Melrose Nakayama, MD;  Location: [place];  Service: Orthopedics;  Laterality: Left;    Current Medications: Current Meds  Medication Sig  . amoxicillin (AMOXIL) 500 MG tablet Take 4 tablets (2,00 mg) 45 minutes prior to any dental cleanings or procedures.  . Artificial Tear Ointment (DRY EYES OP) Apply to eye as needed.  . gabapentin (NEURONTIN) 300 MG capsule Take 300 mg by mouth at bedtime.   [name] LINZESS 72 MCG capsule TAKE 1 CAPSULE (72 MCG TOTAL) BY MOUTH DAILY BEFORE BREAKFAST.  [name] lisinopril-hydrochlorothiazide (ZESTORETIC) 20-12.5 MG tablet Take 1 tablet by mouth daily.  . meloxicam (MOBIC) 15 MG tablet Take 1 tablet (15 mg total) by mouth daily.  . Multiple Vitamins-Minerals (MULTIVITAMIN ADULT PO) Take by mouth daily.     Allergies:   Patient has no known allergies.   Social History   Socioeconomic History  . Marital status: Married    Spouse name: Nicole Kindred  . Number of children: 1  . Years of education: Not on file  . Highest education level: Not on  file  Occupational History  . Occupation: office work  Tobacco Use  . Smoking status: Former Smoker    Types: Cigarettes  . Smokeless tobacco: Never Used  . Tobacco comment: "smoked some socially in my college days"  Vaping Use  . Vaping Use: Never used  Substance and Sexual Activity  . Alcohol use: Yes    Comment: occasionally beer/wine  . Drug use: No  . Sexual activity: Yes    Partners: Male    Birth control/protection: None  Other Topics Concern  . Not on file  Social History Narrative   Lives with husband   Exercise walking 2 x weekly   Patient's family history -  UNKNOWN ADOPTED   Social Determinants of Health   Financial Resource Strain:   . Difficulty of Paying Living Expenses:   Food Insecurity:   . Worried About Charity fundraiser in the  Last Year:   . [place] in the Last Year:   Transportation Needs:   . Film/video editor (Medical):   [name] Lack of Transportation (Non-Medical):   Physical Activity:   . Days of Exercise per Week:   . Minutes of Exercise per Session:   Stress:   . Feeling of Stress :   Social Connections:   . Frequency of Communication with Friends and Family:   . Frequency of Social Gatherings with Friends and Family:   . Attends Religious Services:   . Active Member of Clubs or Organizations:   . Attends Archivist Meetings:   [name] Marital Status:      Family History: The patient's family history is not on file. She was adopted. ROS:   Please see the history of present illness.    All other systems reviewed and are negative.  EKGs/Labs/Other Studies Reviewed:    The following studies were reviewed today:    Recent Labs: 03/05/2019: Hemoglobin 13.5; Platelets 164; TSH 1.240 09/06/2019: ALT 20 12/26/2019: BUN 13; Creatinine, Ser 0.63; Potassium 4.6; Sodium 134  Recent Lipid Panel    Component Value Date/Time   CHOL 145 09/06/2019 1045   TRIG 82 09/06/2019 1045   HDL 74 09/06/2019 1045   CHOLHDL 2.0  09/06/2019 1045   LDLCALC 55 09/06/2019 1045    Physical Exam:    VS:  BP (!) 142/58   Pulse 67   Ht 5\' 3"  (1.6 m)   Wt 142 lb (64.4 kg)   SpO2 99%   BMI 25.15 kg/m     Wt Readings from Last 3 Encounters:  01/20/20 142 lb (64.4 kg)  09/06/19 147 lb (66.7 kg)  05/02/19 157 lb (71.2 kg)     GEN:  Well nourished, well developed in no acute distress HEENT: Normal NECK: No JVD; No carotid bruits LYMPHATICS: No lymphadenopathy CARDIAC: 1/6 murmur aortic regurgitation left sternal border pulse pressure is not widened she has no peripheral pistol shot.  RRR, no rubs, gallops RESPIRATORY:  Clear to auscultation without rales, wheezing or rhonchi  ABDOMEN: Soft, non-tender, non-distended MUSCULOSKELETAL:  No edema; No deformity  SKIN: Warm and dry NEUROLOGIC:  Alert and oriented x 3 PSYCHIATRIC:  Normal affect    Signed, Shirlee More, MD  01/20/2020 11:55 AM    [place]

## 2020-01-20 ENCOUNTER — Encounter: Payer: Self-pay | Admitting: Cardiology

## 2020-01-20 ENCOUNTER — Other Ambulatory Visit: Payer: Self-pay

## 2020-01-20 ENCOUNTER — Ambulatory Visit (INDEPENDENT_AMBULATORY_CARE_PROVIDER_SITE_OTHER): Payer: 59 | Admitting: Cardiology

## 2020-01-20 VITALS — BP 142/58 | HR 67 | Ht 63.0 in | Wt 142.0 lb

## 2020-01-20 DIAGNOSIS — I712 Thoracic aortic aneurysm, without rupture, unspecified: Secondary | ICD-10-CM

## 2020-01-20 DIAGNOSIS — I10 Essential (primary) hypertension: Secondary | ICD-10-CM | POA: Diagnosis not present

## 2020-01-20 DIAGNOSIS — I2510 Atherosclerotic heart disease of native coronary artery without angina pectoris: Secondary | ICD-10-CM

## 2020-01-20 DIAGNOSIS — I700 Atherosclerosis of aorta: Secondary | ICD-10-CM | POA: Diagnosis not present

## 2020-01-20 DIAGNOSIS — I351 Nonrheumatic aortic (valve) insufficiency: Secondary | ICD-10-CM

## 2020-01-20 DIAGNOSIS — E782 Mixed hyperlipidemia: Secondary | ICD-10-CM

## 2020-01-20 NOTE — Patient Instructions (Signed)
Medication Instructions:  Your physician recommends that you continue on your current medications as directed. Please refer to the Current Medication list given to you today.  *If you need a refill on your cardiac medications before your next appointment, please call your pharmacy*   Lab Work: Your physician recommends that you return for lab work in: The first week in June 2022 If you have labs (blood work) drawn today and your tests are completely normal, you will receive your results only by: [name] MyChart Message (if you have MyChart) OR . A paper copy in the mail If you have any lab test that is abnormal or we need to change your treatment, we will call you to review the results.   Testing/Procedures: We have put in an order for you to have a chest CT in June 2022. Please call us closer to this time if you have not heard anything in regards to scheduling this.    Follow-Up: At [facility], you and your health needs are our priority.  As part of our continuing mission to provide you with exceptional heart care, we have created designated Provider Care Teams.  These Care Teams include your primary Cardiologist (physician) and Advanced Practice Providers (APPs -  Physician Assistants and Nurse Practitioners) who all work together to provide you with the care you need, when you need it.  We recommend signing up for the patient portal called "MyChart".  Sign up information is provided on this After Visit Summary.  MyChart is used to connect with patients for Virtual Visits (Telemedicine).  Patients are able to view lab/test results, encounter notes, upcoming appointments, etc.  Non-urgent messages can be sent to your provider as well.   To learn more about what you can do with MyChart, go to [email or web address].    Your next appointment:   1 year(s)  The format for your next appointment:   In Person  Provider:   Shirlee More, MD   Other Instructions

## 2020-02-13 ENCOUNTER — Encounter: Payer: Self-pay | Admitting: Gastroenterology

## 2020-03-06 ENCOUNTER — Encounter: Payer: Self-pay | Admitting: Family Medicine

## 2020-03-06 ENCOUNTER — Other Ambulatory Visit: Payer: Self-pay

## 2020-03-06 ENCOUNTER — Ambulatory Visit: Payer: 59 | Admitting: Family Medicine

## 2020-03-06 VITALS — BP 160/84 | HR 71 | Temp 97.7°F | Ht 63.0 in | Wt 143.4 lb

## 2020-03-06 DIAGNOSIS — R7303 Prediabetes: Secondary | ICD-10-CM

## 2020-03-06 DIAGNOSIS — Z23 Encounter for immunization: Secondary | ICD-10-CM

## 2020-03-06 DIAGNOSIS — I10 Essential (primary) hypertension: Secondary | ICD-10-CM

## 2020-03-06 DIAGNOSIS — E7800 Pure hypercholesterolemia, unspecified: Secondary | ICD-10-CM

## 2020-03-06 HISTORY — DX: Prediabetes: R73.03

## 2020-03-06 NOTE — Patient Instructions (Addendum)
  Monitor BP at home, goal is BP 120/80 or less per last cards note.  If above goal, meds will be adjusted   If you have lab work done today you will be contacted with your lab results within the next 2 weeks.  If you have not heard from [name] then please contact us. The fastest way to get your results is to register for My Chart.   IF you received an x-ray today, you will receive an invoice from [facility]. Please contact [facility] at [phone] with questions or concerns regarding your invoice.   IF you received labwork today, you will receive an invoice from [facility]. Please contact [facility] at [phone] with questions or concerns regarding your invoice.   Our billing staff will not be able to assist you with questions regarding bills from these companies.  You will be contacted with the lab results as soon as they are available. The fastest way to get your results is to activate your My Chart account. Instructions are located on the last page of this paperwork. If you have not heard from [name] regarding the results in 2 weeks, please contact this office.

## 2020-03-06 NOTE — Progress Notes (Signed)
8/27/202110:04 AM  [name] [name] 03-27-1956, 64 y.o., female [id number]  Chief Complaint  Patient presents with  . Follow-up    htn, hlp, pre dm, urinary frequency however not as much as usual    HPI:   Patient is a 64 y.o. female with past medical history significant for colon cancer, chronic constipation, prediabetes, HTN, HLP, thoracic aortic aneurysm, DDD of cervical spine with radiculopathy who presents today for routine followup  Last OV feb 2021 - no changes Saw cards July 2021, Dr Bettina Gavia, BP goal less then 120/80, sees yearly now that TAA is stable She is overall doing well She has not taken BP meds yet today She continues to exercise and monitor her diet She reports her HA sign better since added gabapentin by HA specialist, Dr Domingo Cocking She has no acute concerns today  Depression screen [facility] 2/9 05/02/2019 03/05/2019 02/14/2018  Decreased Interest 0 0 0  Down, Depressed, Hopeless 0 0 0  PHQ - 2 Score 0 0 0  Altered sleeping - - -  Tired, decreased energy - - -  Change in appetite - - -  Feeling bad or failure about yourself  - - -  Trouble concentrating - - -  Moving slowly or fidgety/restless - - -  Suicidal thoughts - - -  PHQ-9 Score - - -  Difficult doing work/chores - - -    Fall Risk  09/06/2019 05/02/2019 03/05/2019 02/14/2018 10/09/2017  Falls in the past year? 0 0 0 No No  Number falls in past yr: 0 - 0 - -  Injury with Fall? 0 - 0 - -     No Known Allergies  Prior to Admission medications   Medication Sig Start Date End Date Taking? Authorizing Provider  amoxicillin (AMOXIL) 500 MG tablet Take 4 tablets (2,00 mg) 45 minutes prior to any dental cleanings or procedures. 03/20/19  Yes Richardo Priest, MD  Artificial Tear Ointment (DRY EYES OP) Apply to eye as needed.   Yes [provider]  gabapentin (NEURONTIN) 300 MG capsule Take 300 mg by mouth at bedtime.  09/02/19  Yes [provider]  LINZESS 72 MCG capsule TAKE 1 CAPSULE (72 MCG TOTAL)  BY MOUTH DAILY BEFORE BREAKFAST. 11/18/19  Yes Nandigam, Venia Minks, MD  lisinopril-hydrochlorothiazide (ZESTORETIC) 20-12.5 MG tablet Take 1 tablet by mouth daily. 04/09/19  Yes Richardo Priest, MD  meloxicam (MOBIC) 15 MG tablet Take 1 tablet (15 mg total) by mouth daily. 03/05/19  Yes Rutherford Guys, MD  Multiple Vitamins-Minerals (MULTIVITAMIN ADULT PO) Take by mouth daily.   Yes [provider]  simvastatin (ZOCOR) 40 MG tablet Take 1 tablet (40 mg total) by mouth at bedtime. 10/10/19 01/08/20  Richardo Priest, MD    Past Medical History:  Diagnosis Date  . Anemia    hx of, not at this time  . Anxiety    no meds  . Aortic regurgitation   . Arthritis    "right hip; left hip; most of my joints" (05/27/2014)  . Depression   . Diverticulosis   . Essential hypertension, benign    takes Lisinopril-HCTZ daily  . Heart murmur   . High cholesterol    takes Pravastatin daily  . History of blood transfusion    no abnormal reaction noted 2015  . History of colon polyps    benign  . Insomnia    doesn't take any meds  . Joint pain   . Neuromuscular disorder ([place])   .  Neuropathy    since chemo and radiation  . Nocturia   . PONV (postoperative nausea and vomiting)    hallucinations  . Rectosigmoid cancer ([place]) 2004    S/P Surgery, Chemo, Rad Tx,  (after chemo treatment menstral periods have ceased).    Past Surgical History:  Procedure Laterality Date  . ANAL RECTAL MANOMETRY N/A 07/17/2017   Procedure: ANO RECTAL MANOMETRY;  Surgeon: Mauri Pole, MD;  [facility];  Service: Endoscopy;  Laterality: N/A;  . COLECTOMY  Aug 2004   S/P Low Anterior Resxn (Sigmoid Colon)  . COLONOSCOPY    . DILATION AND CURETTAGE OF UTERUS  1991  . LUMBAR DISC SURGERY  Feb 2007  . TOTAL HIP ARTHROPLASTY Right 05/27/2014   Procedure: RIGHT TOTAL HIP ARTHROPLASTY ANTERIOR APPROACH;  Surgeon: Hessie Dibble, MD;  Location: [place];  Service: Orthopedics;  Laterality: Right;  .  TOTAL HIP ARTHROPLASTY Left 08/25/2015   Procedure: TOTAL HIP ARTHROPLASTY ANTERIOR APPROACH;  Surgeon: Melrose Nakayama, MD;  Location: [place];  Service: Orthopedics;  Laterality: Left;    Social History   Tobacco Use  . Smoking status: Former Smoker    Types: Cigarettes  . Smokeless tobacco: Never Used  . Tobacco comment: "smoked some socially in my college days"  Substance Use Topics  . Alcohol use: Yes    Comment: occasionally beer/wine    Family History  Adopted: Yes    Review of Systems  Constitutional: Negative for chills and fever.  Respiratory: Negative for cough and shortness of breath.   Cardiovascular: Negative for chest pain, palpitations and leg swelling.  Gastrointestinal: Negative for abdominal pain, nausea and vomiting.   Per hpi  OBJECTIVE:  Today's Vitals   03/06/20 1003  BP: (!) 160/84  Pulse: 71  Temp: 97.7 F (36.5 C)  SpO2: 100%  Weight: 143 lb 6.4 oz (65 kg)  Height: 5' 3"  (1.6 m)   Body mass index is 25.4 kg/m.  BP Readings from Last 3 Encounters:  03/06/20 (!) 160/84  01/20/20 (!) 142/58  09/06/19 137/77   Wt Readings from Last 3 Encounters:  03/06/20 143 lb 6.4 oz (65 kg)  01/20/20 142 lb (64.4 kg)  09/06/19 147 lb (66.7 kg)    Physical Exam Vitals and nursing note reviewed.  Constitutional:      Appearance: She is well-developed.  HENT:     Head: Normocephalic and atraumatic.     Mouth/Throat:     Pharynx: No oropharyngeal exudate.  Eyes:     General: No scleral icterus.    Extraocular Movements: Extraocular movements intact.     Conjunctiva/sclera: Conjunctivae normal.     Pupils: Pupils are equal, round, and reactive to light.  Cardiovascular:     Rate and Rhythm: Normal rate and regular rhythm.     Heart sounds: Normal heart sounds. No murmur heard.  No friction rub. No gallop.   Pulmonary:     Effort: Pulmonary effort is normal.     Breath sounds: Normal breath sounds. No wheezing, rhonchi or rales.    Musculoskeletal:     Cervical back: Neck supple.  Skin:    General: Skin is warm and dry.  Neurological:     Mental Status: She is alert and oriented to person, place, and time.     No results found for this or any previous visit (from the past 24 hour(s)).  No results found.   ASSESSMENT and PLAN  1. Essential hypertension Above goal, has not taken meds  today. Discussed home BP monitoring, patient to report reading in 1 week via mychart. Goal per cards 120/80 or less. RTC precautions reviewed.  - CMP14+EGFR - Lipid panel  2. Prediabetes Labs pending, cont with LFM - CMP14+EGFR - Hemoglobin A1c - Lipid panel  3. Elevated cholesterol Controlled. Continue current regime.  - CMP14+EGFR - Lipid panel  4. Need for prophylactic vaccination and inoculation against influenza - Flu Vaccine QUAD 36+ mos IM  Return in about 6 months (around 09/06/2020).    Rutherford Guys, MD Primary Care at [place], [place] [address] Ph.  [phone] Fax [phone]

## 2020-03-07 LAB — LIPID PANEL
Chol/HDL Ratio: 2.2 ratio (ref 0.0–4.4)
Cholesterol, Total: 157 mg/dL (ref 100–199)
HDL: 70 mg/dL (ref >39)
LDL Chol Calc (NIH): 70 mg/dL (ref 0–99)
Triglycerides: 90 mg/dL (ref 0–149)
VLDL Cholesterol Cal: 17 mg/dL (ref 5–40)

## 2020-03-07 LAB — CMP14+EGFR
ALT: 24 IU/L (ref 0–32)
AST: 22 IU/L (ref 0–40)
Albumin/Globulin Ratio: 1.8 (ref 1.2–2.2)
Albumin: 4.8 g/dL (ref 3.8–4.8)
Alkaline Phosphatase: 67 IU/L (ref 48–121)
BUN/Creatinine Ratio: 16 (ref 12–28)
BUN: 11 mg/dL (ref 8–27)
Bilirubin Total: 0.7 mg/dL (ref 0.0–1.2)
CO2: 22 mmol/L (ref 20–29)
Calcium: 9.2 mg/dL (ref 8.7–10.3)
Chloride: 94 mmol/L — ABNORMAL LOW (ref 96–106)
Creatinine, Ser: 0.68 mg/dL (ref 0.57–1.00)
GFR calc Af Amer: 108 mL/min/{1.73_m2} (ref >59)
GFR calc non Af Amer: 93 mL/min/{1.73_m2} (ref >59)
Globulin, Total: 2.6 g/dL (ref 1.5–4.5)
Glucose: 115 mg/dL — ABNORMAL HIGH (ref 65–99)
Potassium: 4.1 mmol/L (ref 3.5–5.2)
Sodium: 133 mmol/L — ABNORMAL LOW (ref 134–144)
Total Protein: 7.4 g/dL (ref 6.0–8.5)

## 2020-03-07 LAB — HEMOGLOBIN A1C
Est. average glucose Bld gHb Est-mCnc: 120 mg/dL
Hgb A1c MFr Bld: 5.8 % — ABNORMAL HIGH (ref 4.8–5.6)

## 2020-03-11 ENCOUNTER — Other Ambulatory Visit: Payer: Self-pay | Admitting: Family Medicine

## 2020-03-11 DIAGNOSIS — M4722 Other spondylosis with radiculopathy, cervical region: Secondary | ICD-10-CM

## 2020-03-24 ENCOUNTER — Other Ambulatory Visit: Payer: Self-pay | Admitting: Gastroenterology

## 2020-04-05 ENCOUNTER — Other Ambulatory Visit: Payer: Self-pay | Admitting: Cardiology

## 2020-07-08 ENCOUNTER — Telehealth: Payer: Self-pay | Admitting: Family Medicine

## 2020-07-08 DIAGNOSIS — M4722 Other spondylosis with radiculopathy, cervical region: Secondary | ICD-10-CM

## 2020-07-08 MED ORDER — MELOXICAM 15 MG PO TABS: 15.0000 mg | ORAL_TABLET | Freq: Every day | ORAL | 0 refills | Status: DC

## 2020-07-08 NOTE — Telephone Encounter (Signed)
Requested medication (s) are due for refill today:  Yes  Requested medication (s) are on the active medication list:  Yes  Future visit scheduled:  Yes  Last Refill: 03/11/20; #90/ no refills  Notes to clinic: failed protocol because last Hgb. check Is over one year old.  Please advise.  Requested Prescriptions  Pending Prescriptions Disp Refills   meloxicam (MOBIC) 15 MG tablet 90 tablet 0    Sig: Take 1 tablet (15 mg total) by mouth daily.      Analgesics:  COX2 Inhibitors Failed - 07/08/2020  1:58 PM      Failed - HGB in normal range and within 360 days    Hemoglobin  Date Value Ref Range Status  03/05/2019 13.5 11.1 - 15.9 g/dL Final   HGB  Date Value Ref Range Status  03/30/2015 14.0 11.6 - 15.9 g/dL Final          Passed - Cr in normal range and within 360 days    Creatinine  Date Value Ref Range Status  03/30/2015 0.7 0.6 - 1.1 mg/dL Final   Creatinine, Ser  Date Value Ref Range Status  03/06/2020 0.68 0.57 - 1.00 mg/dL Final          Passed - Patient is not pregnant      Passed - Valid encounter within last 12 months    Recent Outpatient Visits           4 months ago Essential hypertension   Primary Care at [facility], Meda Coffee, MD   10 months ago Essential hypertension   Primary Care at [facility], Meda Coffee, MD   1 year ago Essential hypertension   Primary Care at [facility], Meda Coffee, MD   2 years ago Annual physical exam   [facility] at [facility], Dema Severin, PA-C   2 years ago Cervical radiculopathy   Primary Care at [facility], Gerald Stabs, PA-C       Future Appointments             In 2 months Sagardia, Eilleen Kempf, MD Primary Care at [place], [facility]

## 2020-07-08 NOTE — Telephone Encounter (Signed)
Medication: meloxicam (MOBIC) 15 MG tablet [[id number]]  Has the patient contacted their pharmacy? YES  (Agent: If no, request that the patient contact the pharmacy for the refill.) (Agent: If yes, when and what did the pharmacy advise?)  Preferred Pharmacy (with phone number or street name): [id number] [place], [place] - [address] [place] [address] Phone: [phone] Fax: [phone] Hours: Not open 24 hours    Agent: Please be advised that RX refills may take up to 3 business days. We ask that you follow-up with your pharmacy.

## 2020-07-13 ENCOUNTER — Telehealth: Payer: Self-pay | Admitting: Family Medicine

## 2020-07-13 NOTE — Telephone Encounter (Signed)
Pt has been of prescriptipn for several days and would like a refill her asap .

## 2020-07-13 NOTE — Telephone Encounter (Signed)
Pt called and is stating her pharmacy does not have the Rx listed below. Let pt know that we sent it in on 12/29 but we will resend it.  meloxicam (MOBIC) 15 MG tablet [[id number]]   [id number] [place], [place] - [address]

## 2020-07-13 NOTE — Telephone Encounter (Signed)
Pt is requesting a refill on her Meloxixam. 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 Medication was last Refilled on 03/11/2020 for 90 tabs with 0 refills. Pt was last seen on 03/06/2020. Pt has an appt coming up on 09/07/2020. Please advise

## 2020-07-14 ENCOUNTER — Telehealth: Payer: Self-pay | Admitting: *Deleted

## 2020-07-14 NOTE — Telephone Encounter (Signed)
Ok to refill disp 90 no refills  Thank you  Rich

## 2020-07-14 NOTE — Telephone Encounter (Signed)
Patient is calling to inquire about prescription that was refilled and not at pharmacy- she is very upset- explained to her that the Rx is marked as print and was not changed when refilled. (We can not change a print Rx  When we RF) Advise patient I would call the pharmacy for her so she does not have to wait. Rx called to pharmacy as refilled 07/08/20

## 2020-07-25 ENCOUNTER — Other Ambulatory Visit: Payer: Self-pay | Admitting: Gastroenterology

## 2020-09-07 ENCOUNTER — Other Ambulatory Visit: Payer: Self-pay

## 2020-09-07 ENCOUNTER — Ambulatory Visit: Payer: 59 | Admitting: Emergency Medicine

## 2020-09-07 ENCOUNTER — Encounter: Payer: Self-pay | Admitting: Emergency Medicine

## 2020-09-07 VITALS — BP 130/60 | HR 99 | Temp 97.6°F | Resp 18 | Ht 63.0 in | Wt 150.4 lb

## 2020-09-07 DIAGNOSIS — I10 Essential (primary) hypertension: Secondary | ICD-10-CM

## 2020-09-07 DIAGNOSIS — R7303 Prediabetes: Secondary | ICD-10-CM

## 2020-09-07 DIAGNOSIS — E7800 Pure hypercholesterolemia, unspecified: Secondary | ICD-10-CM

## 2020-09-07 DIAGNOSIS — Z85038 Personal history of other malignant neoplasm of large intestine: Secondary | ICD-10-CM | POA: Diagnosis not present

## 2020-09-07 DIAGNOSIS — Z7689 Persons encountering health services in other specified circumstances: Secondary | ICD-10-CM

## 2020-09-07 DIAGNOSIS — I700 Atherosclerosis of aorta: Secondary | ICD-10-CM

## 2020-09-07 DIAGNOSIS — C189 Malignant neoplasm of colon, unspecified: Secondary | ICD-10-CM | POA: Insufficient documentation

## 2020-09-07 LAB — POCT URINALYSIS DIP (MANUAL ENTRY)
Bilirubin, UA: NEGATIVE
Blood, UA: NEGATIVE
Glucose, UA: NEGATIVE mg/dL
Ketones, POC UA: NEGATIVE mg/dL
Leukocytes, UA: NEGATIVE
Nitrite, UA: NEGATIVE
Protein Ur, POC: NEGATIVE mg/dL
Spec Grav, UA: <=1.005 — AB (ref 1.010–1.025)
Urobilinogen, UA: 0.2 E.U./dL
pH, UA: 6 (ref 5.0–8.0)

## 2020-09-07 NOTE — Patient Instructions (Addendum)
   If you have lab work done today you will be contacted with your lab results within the next 2 weeks.  If you have not heard from us then please contact us. The fastest way to get your results is to register for My Chart.   IF you received an x-ray today, you will receive an invoice from [facility]. Please contact [facility] at [phone] with questions or concerns regarding your invoice.   IF you received labwork today, you will receive an invoice from [facility]. Please contact [facility] at [phone] with questions or concerns regarding your invoice.   Our billing staff will not be able to assist you with questions regarding bills from these companies.  You will be contacted with the lab results as soon as they are available. The fastest way to get your results is to activate your My Chart account. Instructions are located on the last page of this paperwork. If you have not heard from us regarding the results in 2 weeks, please contact this office.     Health Maintenance, Female Adopting a healthy lifestyle and getting preventive care are important in promoting health and wellness. Ask your health care provider about:  The right schedule for you to have regular tests and exams.  Things you can do on your own to prevent diseases and keep yourself healthy. What should I know about diet, weight, and exercise? Eat a healthy diet  Eat a diet that includes plenty of vegetables, fruits, low-fat dairy products, and lean protein.  Do not eat a lot of foods that are high in solid fats, added sugars, or sodium.   Maintain a healthy weight Body mass index (BMI) is used to identify weight problems. It estimates body fat based on height and weight. Your health care provider can help determine your BMI and help you achieve or maintain a healthy weight. Get regular exercise Get regular exercise. This is one of the most important things you can do for your health. Most  adults should:  Exercise for at least 150 minutes each week. The exercise should increase your heart rate and make you sweat (moderate-intensity exercise).  Do strengthening exercises at least twice a week. This is in addition to the moderate-intensity exercise.  Spend less time sitting. Even light physical activity can be beneficial. Watch cholesterol and blood lipids Have your blood tested for lipids and cholesterol at 64 years of age, then have this test every 5 years. Have your cholesterol levels checked more often if:  Your lipid or cholesterol levels are high.  You are older than 64 years of age.  You are at high risk for heart disease. What should I know about cancer screening? Depending on your health history and family history, you may need to have cancer screening at various ages. This may include screening for:  Breast cancer.  Cervical cancer.  Colorectal cancer.  Skin cancer.  Lung cancer. What should I know about heart disease, diabetes, and high blood pressure? Blood pressure and heart disease  High blood pressure causes heart disease and increases the risk of stroke. This is more likely to develop in people who have high blood pressure readings, are of African descent, or are overweight.  Have your blood pressure checked: ? Every 3-5 years if you are 18-39 years of age. ? Every year if you are 40 years old or older. Diabetes Have regular diabetes screenings. This checks your fasting blood sugar level. Have the screening done:  Once every   every three years after age 40 if you are at a normal weight and have a low risk for diabetes.  More often and at a younger age if you are overweight or have a high risk for diabetes. What should I know about preventing infection? Hepatitis B If you have a higher risk for hepatitis B, you should be screened for this virus. Talk with your health care provider to find out if you are at risk for hepatitis B infection. Hepatitis  C Testing is recommended for:  Everyone born from 1945 through 1965.  Anyone with known risk factors for hepatitis C. Sexually transmitted infections (STIs)  Get screened for STIs, including gonorrhea and chlamydia, if: ? You are sexually active and are younger than 64 years of age. ? You are older than 64 years of age and your health care provider tells you that you are at risk for this type of infection. ? Your sexual activity has changed since you were last screened, and you are at increased risk for chlamydia or gonorrhea. Ask your health care provider if you are at risk.  Ask your health care provider about whether you are at high risk for HIV. Your health care provider may recommend a prescription medicine to help prevent HIV infection. If you choose to take medicine to prevent HIV, you should first get tested for HIV. You should then be tested every 3 months for as long as you are taking the medicine. Pregnancy  If you are about to stop having your period (premenopausal) and you may become pregnant, seek counseling before you get pregnant.  Take 400 to 800 micrograms (mcg) of folic acid every day if you become pregnant.  Ask for birth control (contraception) if you want to prevent pregnancy. Osteoporosis and menopause Osteoporosis is a disease in which the bones lose minerals and strength with aging. This can result in bone fractures. If you are 65 years old or older, or if you are at risk for osteoporosis and fractures, ask your health care provider if you should:  Be screened for bone loss.  Take a calcium or vitamin D supplement to lower your risk of fractures.  Be given hormone replacement therapy (HRT) to treat symptoms of menopause. Follow these instructions at home: Lifestyle  Do not use any products that contain nicotine or tobacco, such as cigarettes, e-cigarettes, and chewing tobacco. If you need help quitting, ask your health care provider.  Do not use street  drugs.  Do not share needles.  Ask your health care provider for help if you need support or information about quitting drugs. Alcohol use  Do not drink alcohol if: ? Your health care provider tells you not to drink. ? You are pregnant, may be pregnant, or are planning to become pregnant.  If you drink alcohol: ? Limit how much you use to 0-1 drink a day. ? Limit intake if you are breastfeeding.  Be aware of how much alcohol is in your drink. In the [place] one drink equals one 12 oz bottle of beer (355 mL), one 5 oz glass of wine (148 mL), or one 1 oz glass of hard liquor (44 mL). General instructions  Schedule regular health, dental, and eye exams.  Stay current with your vaccines.  Tell your health care provider if: ? You often feel depressed. ? You have ever been abused or do not feel safe at home. Summary  Adopting a healthy lifestyle and getting preventive care are important in promoting health and   wellness.  Follow your health care provider's instructions about healthy diet, exercising, and getting tested or screened for diseases.  Follow your health care provider's instructions on monitoring your cholesterol and blood pressure. This information is not intended to replace advice given to you by your health care provider. Make sure you discuss any questions you have with your health care provider. Document Revised: 06/20/2018 Document Reviewed: 06/20/2018 Elsevier Patient Education  2021 [employer].  Diabetes Mellitus and Nutrition, Adult When you have diabetes, or diabetes mellitus, it is very important to have healthy eating habits because your blood sugar (glucose) levels are greatly affected by what you eat and drink. Eating healthy foods in the right amounts, at about the same times every day, can help you:  Control your blood glucose.  Lower your risk of heart disease.  Improve your blood pressure.  Reach or maintain a healthy weight. What can affect my  meal plan? Every person with diabetes is different, and each person has different needs for a meal plan. Your health care provider may recommend that you work with a dietitian to make a meal plan that is best for you. Your meal plan may vary depending on factors such as:  The calories you need.  The medicines you take.  Your weight.  Your blood glucose, blood pressure, and cholesterol levels.  Your activity level.  Other health conditions you have, such as heart or kidney disease. How do carbohydrates affect me? Carbohydrates, also called carbs, affect your blood glucose level more than any other type of food. Eating carbs naturally raises the amount of glucose in your blood. Carb counting is a method for keeping track of how many carbs you eat. Counting carbs is important to keep your blood glucose at a healthy level, especially if you use insulin or take certain oral diabetes medicines. It is important to know how many carbs you can safely have in each meal. This is different for every person. Your dietitian can help you calculate how many carbs you should have at each meal and for each snack. How does alcohol affect me? Alcohol can cause a sudden decrease in blood glucose (hypoglycemia), especially if you use insulin or take certain oral diabetes medicines. Hypoglycemia can be a life-threatening condition. Symptoms of hypoglycemia, such as sleepiness, dizziness, and confusion, are similar to symptoms of having too much alcohol.  Do not drink alcohol if: ? Your health care provider tells you not to drink. ? You are pregnant, may be pregnant, or are planning to become pregnant.  If you drink alcohol: ? Do not drink on an empty stomach. ? Limit how much you use to:  0-1 drink a day for women.  0-2 drinks a day for men. ? Be aware of how much alcohol is in your drink. In the [place] one drink equals one 12 oz bottle of beer (355 mL), one 5 oz glass of wine (148 mL), or one 1 oz glass of  hard liquor (44 mL). ? Keep yourself hydrated with water, diet soda, or unsweetened iced tea.  Keep in mind that regular soda, juice, and other mixers may contain a lot of sugar and must be counted as carbs. What are tips for following this plan? Reading food labels  Start by checking the serving size on the "Nutrition Facts" label of packaged foods and drinks. The amount of calories, carbs, fats, and other nutrients listed on the label is based on one serving of the item. Many items contain more  than one serving per package.  Check the total grams (g) of carbs in one serving. You can calculate the number of servings of carbs in one serving by dividing the total carbs by 15. For example, if a food has 30 g of total carbs per serving, it would be equal to 2 servings of carbs.  Check the number of grams (g) of saturated fats and trans fats in one serving. Choose foods that have a low amount or none of these fats.  Check the number of milligrams (mg) of salt (sodium) in one serving. Most people should limit total sodium intake to less than 2,300 mg per day.  Always check the nutrition information of foods labeled as "low-fat" or "nonfat." These foods may be higher in added sugar or refined carbs and should be avoided.  Talk to your dietitian to identify your daily goals for nutrients listed on the label. Shopping  Avoid buying canned, pre-made, or processed foods. These foods tend to be high in fat, sodium, and added sugar.  Shop around the outside edge of the grocery store. This is where you will most often find fresh fruits and vegetables, bulk grains, fresh meats, and fresh dairy. Cooking  Use low-heat cooking methods, such as baking, instead of high-heat cooking methods like deep frying.  Cook using healthy oils, such as olive, canola, or sunflower oil.  Avoid cooking with butter, cream, or high-fat meats. Meal planning  Eat meals and snacks regularly, preferably at the same times  every day. Avoid going long periods of time without eating.  Eat foods that are high in fiber, such as fresh fruits, vegetables, beans, and whole grains. Talk with your dietitian about how many servings of carbs you can eat at each meal.  Eat 4-6 oz (112-168 g) of lean protein each day, such as lean meat, chicken, fish, eggs, or tofu. One ounce (oz) of lean protein is equal to: ? 1 oz (28 g) of meat, chicken, or fish. ? 1 egg. ?  cup (62 g) of tofu.  Eat some foods each day that contain healthy fats, such as avocado, nuts, seeds, and fish.   What foods should I eat? Fruits Berries. Apples. Oranges. Peaches. Apricots. Plums. Grapes. Mango. Papaya. Pomegranate. Kiwi. Cherries. Vegetables Lettuce. Spinach. Leafy greens, including kale, chard, collard greens, and mustard greens. Beets. Cauliflower. Cabbage. Broccoli. Carrots. Green beans. Tomatoes. Peppers. Onions. Cucumbers. Brussels sprouts. Grains Whole grains, such as whole-wheat or whole-grain bread, crackers, tortillas, cereal, and pasta. Unsweetened oatmeal. Quinoa. Brown or wild rice. Meats and other proteins Seafood. Poultry without skin. Lean cuts of poultry and beef. Tofu. Nuts. Seeds. Dairy Low-fat or fat-free dairy products such as milk, yogurt, and cheese. The items listed above may not be a complete list of foods and beverages you can eat. Contact a dietitian for more information. What foods should I avoid? Fruits Fruits canned with syrup. Vegetables Canned vegetables. Frozen vegetables with butter or cream sauce. Grains Refined white flour and flour products such as bread, pasta, snack foods, and cereals. Avoid all processed foods. Meats and other proteins Fatty cuts of meat. Poultry with skin. Breaded or fried meats. Processed meat. Avoid saturated fats. Dairy Full-fat yogurt, cheese, or milk. Beverages Sweetened drinks, such as soda or iced tea. The items listed above may not be a complete list of foods and beverages  you should avoid. Contact a dietitian for more information. Questions to ask a health care provider  Do I need to meet with a diabetes  educator?  Do I need to meet with a dietitian?  What number can I call if I have questions?  When are the best times to check my blood glucose? Where to find more information:  American Diabetes Association: diabetes.org  Academy of Nutrition and Dietetics: www.eatright.[employer] of Diabetes and Digestive and Kidney Diseases: [employer]  Association of Diabetes Care and Education Specialists: www.diabeteseducator.org Summary  It is important to have healthy eating habits because your blood sugar (glucose) levels are greatly affected by what you eat and drink.  A healthy meal plan will help you control your blood glucose and maintain a healthy lifestyle.  Your health care provider may recommend that you work with a dietitian to make a meal plan that is best for you.  Keep in mind that carbohydrates (carbs) and alcohol have immediate effects on your blood glucose levels. It is important to count carbs and to use alcohol carefully. This information is not intended to replace advice given to you by your health care provider. Make sure you discuss any questions you have with your health care provider. Document Revised: 06/04/2019 Document Reviewed: 06/04/2019 Elsevier Patient Education  2021 [employer].

## 2020-09-07 NOTE — Progress Notes (Signed)
[name] [name] 65 y.o.   Chief Complaint  Patient presents with  . Transitions Of Care    Patient states she is here for a TOC and would like to get labs done and discuss diabetes recheck.    HISTORY OF PRESENT ILLNESS: This is a 65 y.o. female former patient of Dr. Pamella Pert here to establish care with me. Has history of prediabetes, borderline diabetes.  On no medications. Has history of chronic headaches for which she takes gabapentin. History of hypertension on Zestoretic 20-12.5 mg daily. History of dyslipidemia on simvastatin 40 mg daily. History of arthritis, status post bilateral hip replacements. History of thoracic aorta aneurysm, under surveillance. Has chronic pain from arthritis including neck pain. Fully vaccinated against COVID with a booster. No other complaints or medical concerns today.  HPI   Prior to Admission medications   Medication Sig Start Date End Date Taking? Authorizing Provider  Artificial Tear Ointment (DRY EYES OP) Apply to eye as needed.   Yes [provider]  gabapentin (NEURONTIN) 300 MG capsule Take 300 mg by mouth at bedtime.  09/02/19  Yes [provider]  LINZESS 72 MCG capsule TAKE 1 CAPSULE (72 MCG TOTAL) BY MOUTH DAILY BEFORE BREAKFAST. 07/28/20  Yes Nandigam, Venia Minks, MD  lisinopril-hydrochlorothiazide (ZESTORETIC) 20-12.5 MG tablet TAKE 1 TABLET BY MOUTH EVERY DAY 04/06/20  Yes Richardo Priest, MD  meloxicam (MOBIC) 15 MG tablet Take 1 tablet (15 mg total) by mouth daily. 07/08/20  Yes Just, Laurita Quint, FNP  Multiple Vitamins-Minerals (MULTIVITAMIN ADULT PO) Take by mouth daily.   Yes [provider]  simvastatin (ZOCOR) 40 MG tablet Take 1 tablet (40 mg total) by mouth at bedtime. 10/10/19 01/08/20  Richardo Priest, MD    No Known Allergies  Patient Active Problem List   Diagnosis Date Noted  . Prediabetes 03/06/2020  . Abnormal echocardiogram 05/08/2019  . Enlarged thoracic aorta ([place]) 04/17/2019  . Elevated  cholesterol 02/14/2018  . Status post bilateral total hip replacement 05/27/2014  . ANXIETY DISORDER- mixed disorder w/ depressive symptoms 02/07/2014  . PERIPHERAL NEUROPATHY 10/13/2007  . Essential hypertension 10/13/2007  . HERNIA, UMBILICAL 04/02/3006  . COLONIC POLYPS, ADENOMATOUS 02/20/2003  . DIVERTICULOSIS, COLON 02/20/2003  . ADENOCARCINOMA, COLON, SIGMOID, HX OF 02/20/2003    Past Medical History:  Diagnosis Date  . Anemia    hx of, not at this time  . Anxiety    no meds  . Aortic regurgitation   . Arthritis    "right hip; left hip; most of my joints" (05/27/2014)  . Blood transfusion without reported diagnosis    Phreesia 09/04/2020  . Cancer ([place])    Phreesia 09/04/2020  . Depression   . Diverticulosis   . Essential hypertension, benign    takes Lisinopril-HCTZ daily  . Heart murmur   . High cholesterol    takes Pravastatin daily  . History of blood transfusion    no abnormal reaction noted 2015  . History of colon polyps    benign  . Hyperlipidemia    Phreesia 09/04/2020  . Hypertension    Phreesia 09/04/2020  . Insomnia    doesn't take any meds  . Joint pain   . Neuromuscular disorder ([place])   . Neuropathy    since chemo and radiation  . Nocturia   . PONV (postoperative nausea and vomiting)    hallucinations  . Rectosigmoid cancer ([place]) 2004    S/P Surgery, Chemo, Rad Tx,  (after chemo treatment menstral periods have  ceased).    Past Surgical History:  Procedure Laterality Date  . ANAL RECTAL MANOMETRY N/A 07/17/2017   Procedure: ANO RECTAL MANOMETRY;  Surgeon: Mauri Pole, MD;  Location: [facility];  Service: Endoscopy;  Laterality: N/A;  . COLECTOMY  Aug 2004   S/P Low Anterior Resxn (Sigmoid Colon)  . COLON SURGERY N/A    Phreesia 09/04/2020  . COLONOSCOPY    . DILATION AND CURETTAGE OF UTERUS  1991  . JOINT REPLACEMENT N/A    Phreesia 09/04/2020  . LUMBAR DISC SURGERY  Feb 2007  . SPINE SURGERY N/A    Phreesia 09/04/2020  .  TOTAL HIP ARTHROPLASTY Right 05/27/2014   Procedure: RIGHT TOTAL HIP ARTHROPLASTY ANTERIOR APPROACH;  Surgeon: Hessie Dibble, MD;  Location: [facility];  Service: Orthopedics;  Laterality: Right;  . TOTAL HIP ARTHROPLASTY Left 08/25/2015   Procedure: TOTAL HIP ARTHROPLASTY ANTERIOR APPROACH;  Surgeon: Melrose Nakayama, MD;  Location: [facility];  Service: Orthopedics;  Laterality: Left;    Social History   Socioeconomic History  . Marital status: Married    Spouse name: Nicole Kindred  . Number of children: 1  . Years of education: Not on file  . Highest education level: Not on file  Occupational History  . Occupation: office work  Tobacco Use  . Smoking status: Former Smoker    Types: Cigarettes  . Smokeless tobacco: Never Used  . Tobacco comment: "smoked some socially in my college days"  Vaping Use  . Vaping Use: Never used  Substance and Sexual Activity  . Alcohol use: Yes    Comment: occasionally beer/wine  . Drug use: No  . Sexual activity: Yes    Partners: Male    Birth control/protection: None  Other Topics Concern  . Not on file  Social History Narrative   Lives with husband   Exercise walking 2 x weekly   Patient's family history -  UNKNOWN ADOPTED   Social Determinants of Health   Financial Resource Strain: Not on file  Food Insecurity: Not on file  Transportation Needs: Not on file  Physical Activity: Not on file  Stress: Not on file  Social Connections: Not on file  Intimate Partner Violence: Not on file    Family History  Adopted: Yes     Review of Systems  Constitutional: Negative.  Negative for chills and fever.  HENT: Negative.  Negative for congestion and sore throat.   Respiratory: Negative.  Negative for cough and shortness of breath.   Cardiovascular: Negative.  Negative for chest pain and palpitations.  Gastrointestinal: Negative.  Negative for nausea and vomiting.  Genitourinary: Negative.  Negative for dysuria and hematuria.  Musculoskeletal: Positive  for joint pain and neck pain. Negative for myalgias.  Skin: Negative.  Negative for rash.  Neurological: Negative.  Negative for dizziness and headaches.  All other systems reviewed and are negative.    Physical Exam Vitals reviewed.  Constitutional:      Appearance: Normal appearance.  HENT:     Head: Normocephalic.  Eyes:     Extraocular Movements: Extraocular movements intact.     Conjunctiva/sclera: Conjunctivae normal.     Pupils: Pupils are equal, round, and reactive to light.  Cardiovascular:     Rate and Rhythm: Normal rate and regular rhythm.     Heart sounds: Murmur (3/6 systolic best heard at aortic area) heard.    Pulmonary:     Effort: Pulmonary effort is normal.     Breath sounds: Normal breath sounds.  Musculoskeletal:        General: Normal range of motion.  Skin:    General: Skin is warm and dry.     Capillary Refill: Capillary refill takes less than 2 seconds.  Neurological:     General: No focal deficit present.     Mental Status: She is alert and oriented to person, place, and time.  Psychiatric:        Mood and Affect: Mood normal.        Behavior: Behavior normal.     Results for orders placed or performed in visit on 09/07/20 (from the past 24 hour(s))  POCT urinalysis dipstick     Status: Abnormal   Collection Time: 09/07/20 10:32 AM  Result Value Ref Range   Color, UA yellow yellow   Clarity, UA clear clear   Glucose, UA negative negative mg/dL   Bilirubin, UA negative negative   Ketones, POC UA negative negative mg/dL   Spec Grav, UA <=1.005 (A) 1.010 - 1.025   Blood, UA negative negative   pH, UA 6.0 5.0 - 8.0   Protein Ur, POC negative negative mg/dL   Urobilinogen, UA 0.2 0.2 or 1.0 E.U./dL   Nitrite, UA Negative Negative   Leukocytes, UA Negative Negative    A total of 30 minutes was spent with the patient, greater than 50% of which was in counseling/coordination of care regarding establishing care with me, review of all chronic  medical problems and their management, review of all medications, education on nutrition, cardiovascular risks associated with hypertension, diabetes, and dyslipidemia, review of most recent blood work results, review of most recent office visit notes with Dr. Pamella Pert, documentation, prognosis, and need for follow-up.   ASSESSMENT & PLAN: Clinically stable.  No medical concerns identified during this visit.  Continue present medications.  No changes.  Follow-up in 6 months. Yizel was seen today for transitions of care.  Diagnoses and all orders for this visit:  Encounter to establish care  Essential hypertension -     CMP14+EGFR -     POCT urinalysis dipstick  Prediabetes -     Hemoglobin A1c -     POCT urinalysis dipstick  Elevated cholesterol -     Lipid panel  History of colon cancer  Aortic atherosclerosis [facility])    Patient Instructions       If you have lab work done today you will be contacted with your lab results within the next 2 weeks.  If you have not heard from [name] then please contact us. The fastest way to get your results is to register for My Chart.   IF you received an x-ray today, you will receive an invoice from [facility]. Please contact [facility] at [phone] with questions or concerns regarding your invoice.   IF you received labwork today, you will receive an invoice from [facility]. Please contact [facility] at [phone] with questions or concerns regarding your invoice.   Our billing staff will not be able to assist you with questions regarding bills from these companies.  You will be contacted with the lab results as soon as they are available. The fastest way to get your results is to activate your My Chart account. Instructions are located on the last page of this paperwork. If you have not heard from [name] regarding the results in 2 weeks, please contact this office.      Health Maintenance, Female Adopting a healthy  lifestyle and getting preventive care are important in promoting health  and wellness. Ask your health care provider about:  The right schedule for you to have regular tests and exams.  Things you can do on your own to prevent diseases and keep yourself healthy. What should I know about diet, weight, and exercise? Eat a healthy diet  Eat a diet that includes plenty of vegetables, fruits, low-fat dairy products, and lean protein.  Do not eat a lot of foods that are high in solid fats, added sugars, or sodium.   Maintain a healthy weight Body mass index (BMI) is used to identify weight problems. It estimates body fat based on height and weight. Your health care provider can help determine your BMI and help you achieve or maintain a healthy weight. Get regular exercise Get regular exercise. This is one of the most important things you can do for your health. Most adults should:  Exercise for at least 150 minutes each week. The exercise should increase your heart rate and make you sweat (moderate-intensity exercise).  Do strengthening exercises at least twice a week. This is in addition to the moderate-intensity exercise.  Spend less time sitting. Even light physical activity can be beneficial. Watch cholesterol and blood lipids Have your blood tested for lipids and cholesterol at 64 years of age, then have this test every 5 years. Have your cholesterol levels checked more often if:  Your lipid or cholesterol levels are high.  You are older than 64 years of age.  You are at high risk for heart disease. What should I know about cancer screening? Depending on your health history and family history, you may need to have cancer screening at various ages. This may include screening for:  Breast cancer.  Cervical cancer.  Colorectal cancer.  Skin cancer.  Lung cancer. What should I know about heart disease, diabetes, and high blood pressure? Blood pressure and heart disease  High  blood pressure causes heart disease and increases the risk of stroke. This is more likely to develop in people who have high blood pressure readings, are of African descent, or are overweight.  Have your blood pressure checked: ? Every 3-5 years if you are 61-15 years of age. ? Every year if you are 83 years old or older. Diabetes Have regular diabetes screenings. This checks your fasting blood sugar level. Have the screening done:  Once every three years after age 52 if you are at a normal weight and have a low risk for diabetes.  More often and at a younger age if you are overweight or have a high risk for diabetes. What should I know about preventing infection? Hepatitis B If you have a higher risk for hepatitis B, you should be screened for this virus. Talk with your health care provider to find out if you are at risk for hepatitis B infection. Hepatitis C Testing is recommended for:  Everyone born from [birth year] through 1964.  Anyone with known risk factors for hepatitis C. Sexually transmitted infections (STIs)  Get screened for STIs, including gonorrhea and chlamydia, if: ? You are sexually active and are younger than 64 years of age. ? You are older than 64 years of age and your health care provider tells you that you are at risk for this type of infection. ? Your sexual activity has changed since you were last screened, and you are at increased risk for chlamydia or gonorrhea. Ask your health care provider if you are at risk.  Ask your health care provider about whether you are at  high risk for HIV. Your health care provider may recommend a prescription medicine to help prevent HIV infection. If you choose to take medicine to prevent HIV, you should first get tested for HIV. You should then be tested every 3 months for as long as you are taking the medicine. Pregnancy  If you are about to stop having your period (premenopausal) and you may become pregnant, seek counseling before you  get pregnant.  Take 400 to 800 micrograms (mcg) of folic acid every day if you become pregnant.  Ask for birth control (contraception) if you want to prevent pregnancy. Osteoporosis and menopause Osteoporosis is a disease in which the bones lose minerals and strength with aging. This can result in bone fractures. If you are 99 years old or older, or if you are at risk for osteoporosis and fractures, ask your health care provider if you should:  Be screened for bone loss.  Take a calcium or vitamin D supplement to lower your risk of fractures.  Be given hormone replacement therapy (HRT) to treat symptoms of menopause. Follow these instructions at home: Lifestyle  Do not use any products that contain nicotine or tobacco, such as cigarettes, e-cigarettes, and chewing tobacco. If you need help quitting, ask your health care provider.  Do not use street drugs.  Do not share needles.  Ask your health care provider for help if you need support or information about quitting drugs. Alcohol use  Do not drink alcohol if: ? Your health care provider tells you not to drink. ? You are pregnant, may be pregnant, or are planning to become pregnant.  If you drink alcohol: ? Limit how much you use to 0-1 drink a day. ? Limit intake if you are breastfeeding.  Be aware of how much alcohol is in your drink. In the [place] one drink equals one 12 oz bottle of beer (355 mL), one 5 oz glass of wine (148 mL), or one 1 oz glass of hard liquor (44 mL). General instructions  Schedule regular health, dental, and eye exams.  Stay current with your vaccines.  Tell your health care provider if: ? You often feel depressed. ? You have ever been abused or do not feel safe at home. Summary  Adopting a healthy lifestyle and getting preventive care are important in promoting health and wellness.  Follow your health care provider's instructions about healthy diet, exercising, and getting tested or screened  for diseases.  Follow your health care provider's instructions on monitoring your cholesterol and blood pressure. This information is not intended to replace advice given to you by your health care provider. Make sure you discuss any questions you have with your health care provider. Document Revised: 06/20/2018 Document Reviewed: 06/20/2018 Elsevier Patient Education  2021 [employer].  Diabetes Mellitus and Nutrition, Adult When you have diabetes, or diabetes mellitus, it is very important to have healthy eating habits because your blood sugar (glucose) levels are greatly affected by what you eat and drink. Eating healthy foods in the right amounts, at about the same times every day, can help you:  Control your blood glucose.  Lower your risk of heart disease.  Improve your blood pressure.  Reach or maintain a healthy weight. What can affect my meal plan? Every person with diabetes is different, and each person has different needs for a meal plan. Your health care provider may recommend that you work with a dietitian to make a meal plan that is best for you. Your meal  plan may vary depending on factors such as:  The calories you need.  The medicines you take.  Your weight.  Your blood glucose, blood pressure, and cholesterol levels.  Your activity level.  Other health conditions you have, such as heart or kidney disease. How do carbohydrates affect me? Carbohydrates, also called carbs, affect your blood glucose level more than any other type of food. Eating carbs naturally raises the amount of glucose in your blood. Carb counting is a method for keeping track of how many carbs you eat. Counting carbs is important to keep your blood glucose at a healthy level, especially if you use insulin or take certain oral diabetes medicines. It is important to know how many carbs you can safely have in each meal. This is different for every person. Your dietitian can help you calculate how  many carbs you should have at each meal and for each snack. How does alcohol affect me? Alcohol can cause a sudden decrease in blood glucose (hypoglycemia), especially if you use insulin or take certain oral diabetes medicines. Hypoglycemia can be a life-threatening condition. Symptoms of hypoglycemia, such as sleepiness, dizziness, and confusion, are similar to symptoms of having too much alcohol.  Do not drink alcohol if: ? Your health care provider tells you not to drink. ? You are pregnant, may be pregnant, or are planning to become pregnant.  If you drink alcohol: ? Do not drink on an empty stomach. ? Limit how much you use to:  0-1 drink a day for women.  0-2 drinks a day for men. ? Be aware of how much alcohol is in your drink. In the [place] one drink equals one 12 oz bottle of beer (355 mL), one 5 oz glass of wine (148 mL), or one 1 oz glass of hard liquor (44 mL). ? Keep yourself hydrated with water, diet soda, or unsweetened iced tea.  Keep in mind that regular soda, juice, and other mixers may contain a lot of sugar and must be counted as carbs. What are tips for following this plan? Reading food labels  Start by checking the serving size on the "Nutrition Facts" label of packaged foods and drinks. The amount of calories, carbs, fats, and other nutrients listed on the label is based on one serving of the item. Many items contain more than one serving per package.  Check the total grams (g) of carbs in one serving. You can calculate the number of servings of carbs in one serving by dividing the total carbs by 15. For example, if a food has 30 g of total carbs per serving, it would be equal to 2 servings of carbs.  Check the number of grams (g) of saturated fats and trans fats in one serving. Choose foods that have a low amount or none of these fats.  Check the number of milligrams (mg) of salt (sodium) in one serving. Most people should limit total sodium intake to less than  2,300 mg per day.  Always check the nutrition information of foods labeled as "low-fat" or "nonfat." These foods may be higher in added sugar or refined carbs and should be avoided.  Talk to your dietitian to identify your daily goals for nutrients listed on the label. Shopping  Avoid buying canned, pre-made, or processed foods. These foods tend to be high in fat, sodium, and added sugar.  Shop around the outside edge of the grocery store. This is where you will most often find fresh fruits and vegetables,  bulk grains, fresh meats, and fresh dairy. Cooking  Use low-heat cooking methods, such as baking, instead of high-heat cooking methods like deep frying.  Cook using healthy oils, such as olive, canola, or sunflower oil.  Avoid cooking with butter, cream, or high-fat meats. Meal planning  Eat meals and snacks regularly, preferably at the same times every day. Avoid going long periods of time without eating.  Eat foods that are high in fiber, such as fresh fruits, vegetables, beans, and whole grains. Talk with your dietitian about how many servings of carbs you can eat at each meal.  Eat 4-6 oz (112-168 g) of lean protein each day, such as lean meat, chicken, fish, eggs, or tofu. One ounce (oz) of lean protein is equal to: ? 1 oz (28 g) of meat, chicken, or fish. ? 1 egg. ?  cup (62 g) of tofu.  Eat some foods each day that contain healthy fats, such as avocado, nuts, seeds, and fish.   What foods should I eat? Fruits Berries. Apples. Oranges. Peaches. Apricots. Plums. Grapes. Mango. Papaya. Pomegranate. Kiwi. Cherries. Vegetables Lettuce. Spinach. Leafy greens, including kale, chard, collard greens, and mustard greens. Beets. Cauliflower. Cabbage. Broccoli. Carrots. Green beans. Tomatoes. Peppers. Onions. Cucumbers. Brussels sprouts. Grains Whole grains, such as whole-wheat or whole-grain bread, crackers, tortillas, cereal, and pasta. Unsweetened oatmeal. Quinoa. Brown or wild  rice. Meats and other proteins Seafood. Poultry without skin. Lean cuts of poultry and beef. Tofu. Nuts. Seeds. Dairy Low-fat or fat-free dairy products such as milk, yogurt, and cheese. The items listed above may not be a complete list of foods and beverages you can eat. Contact a dietitian for more information. What foods should I avoid? Fruits Fruits canned with syrup. Vegetables Canned vegetables. Frozen vegetables with butter or cream sauce. Grains Refined white flour and flour products such as bread, pasta, snack foods, and cereals. Avoid all processed foods. Meats and other proteins Fatty cuts of meat. Poultry with skin. Breaded or fried meats. Processed meat. Avoid saturated fats. Dairy Full-fat yogurt, cheese, or milk. Beverages Sweetened drinks, such as soda or iced tea. The items listed above may not be a complete list of foods and beverages you should avoid. Contact a dietitian for more information. Questions to ask a health care provider  Do I need to meet with a diabetes educator?  Do I need to meet with a dietitian?  What number can I call if I have questions?  When are the best times to check my blood glucose? Where to find more information:  American Diabetes Association: diabetes.org  Academy of Nutrition and Dietetics: www.eatright.[employer] of Diabetes and Digestive and Kidney Diseases: [employer]  Association of Diabetes Care and Education Specialists: www.diabeteseducator.org Summary  It is important to have healthy eating habits because your blood sugar (glucose) levels are greatly affected by what you eat and drink.  A healthy meal plan will help you control your blood glucose and maintain a healthy lifestyle.  Your health care provider may recommend that you work with a dietitian to make a meal plan that is best for you.  Keep in mind that carbohydrates (carbs) and alcohol have immediate effects on your blood glucose levels.  It is important to count carbs and to use alcohol carefully. This information is not intended to replace advice given to you by your health care provider. Make sure you discuss any questions you have with your health care provider. Document Revised: 06/04/2019 Document Reviewed: 06/04/2019 Elsevier Patient Education  2021 [place], [place] [facility] [employer] [facility]

## 2020-09-08 LAB — CMP14+EGFR
ALT: 33 IU/L — ABNORMAL HIGH (ref 0–32)
AST: 27 IU/L (ref 0–40)
Albumin/Globulin Ratio: 1.8 (ref 1.2–2.2)
Albumin: 4.7 g/dL (ref 3.8–4.8)
Alkaline Phosphatase: 64 IU/L (ref 44–121)
BUN/Creatinine Ratio: 17 (ref 12–28)
BUN: 10 mg/dL (ref 8–27)
Bilirubin Total: 0.7 mg/dL (ref 0.0–1.2)
CO2: 21 mmol/L (ref 20–29)
Calcium: 9.3 mg/dL (ref 8.7–10.3)
Chloride: 92 mmol/L — ABNORMAL LOW (ref 96–106)
Creatinine, Ser: 0.6 mg/dL (ref 0.57–1.00)
Globulin, Total: 2.6 g/dL (ref 1.5–4.5)
Glucose: 137 mg/dL — ABNORMAL HIGH (ref 65–99)
Potassium: 4.4 mmol/L (ref 3.5–5.2)
Sodium: 131 mmol/L — ABNORMAL LOW (ref 134–144)
Total Protein: 7.3 g/dL (ref 6.0–8.5)
eGFR: 100 mL/min/{1.73_m2} (ref >59)

## 2020-09-08 LAB — LIPID PANEL
Chol/HDL Ratio: 2.4 ratio (ref 0.0–4.4)
Cholesterol, Total: 159 mg/dL (ref 100–199)
HDL: 65 mg/dL (ref >39)
LDL Chol Calc (NIH): 74 mg/dL (ref 0–99)
Triglycerides: 111 mg/dL (ref 0–149)
VLDL Cholesterol Cal: 20 mg/dL (ref 5–40)

## 2020-09-08 LAB — HEMOGLOBIN A1C
Est. average glucose Bld gHb Est-mCnc: 128 mg/dL
Hgb A1c MFr Bld: 6.1 % — ABNORMAL HIGH (ref 4.8–5.6)

## 2020-09-22 ENCOUNTER — Encounter: Payer: Self-pay | Admitting: Gastroenterology

## 2020-09-22 ENCOUNTER — Ambulatory Visit: Payer: 59 | Admitting: Gastroenterology

## 2020-09-22 VITALS — BP 138/66 | HR 80 | Ht 63.0 in | Wt 151.0 lb

## 2020-09-22 DIAGNOSIS — K5902 Outlet dysfunction constipation: Secondary | ICD-10-CM | POA: Diagnosis not present

## 2020-09-22 NOTE — Progress Notes (Signed)
[name] [name]    [id number]    [birth year]  Primary Care Physician:Sagardia, Ines Bloomer, MD  Referring Physician: Jacelyn Pi, Lilia Argue, MD [place],  [place] [address]   Chief complaint: Constipation  HPI: 65 year old female with history of colon cancer status post resection here for follow-up visit with complaints of incomplete evacuation. She continues to have persistent symptoms of incomplete evacuation and fragmentation of stool despite taking Linzess.  On average she has to go back up to 8 times to feel she has completely evacuated, mostly formed stool. Intermittent lower abdominal cramping.  Denies any blood per rectum, melena, vomiting, decreased appetite or weight loss.  Anorectal manometry July 17, 2017 showed weak anal sphincter and dyssynergic defecation She subsequently did pelvic floor physical therapy with no significant improvement of symptoms  Colonoscopy March 29, 2018: 3 polyps (tubular adenoma and sessile serrated polyp) 5 to 11 mm in size removed from descending and transverse colon with cold snare.  2 polyps (tubular adenoma ) less than 5 mm in size removed with forceps.  Left-sided diverticulosis.  Functional colocolonic anastomosis.  Colonoscopy April 25, 2019: - Mild diverticulosis in the descending colon. - Non-bleeding internal hemorrhoids. - Patent functional end-to-end colo-colonic anastomosis, characterized by healthy appearing mucosa. - No specimens  Outpatient Encounter Medications as of 09/22/2020  Medication Sig  . Artificial Tear Ointment (DRY EYES OP) Apply to eye as needed.  . gabapentin (NEURONTIN) 300 MG capsule Take 300 mg by mouth at bedtime.   [name] LINZESS 72 MCG capsule TAKE 1 CAPSULE (72 MCG TOTAL) BY MOUTH DAILY BEFORE BREAKFAST.  [name] lisinopril-hydrochlorothiazide (ZESTORETIC) 20-12.5 MG tablet TAKE 1 TABLET BY MOUTH EVERY DAY  . meloxicam (MOBIC) 15 MG tablet Take 1 tablet (15 mg total) by  mouth daily.  . Multiple Vitamins-Minerals (MULTIVITAMIN ADULT PO) Take by mouth daily.  . simvastatin (ZOCOR) 40 MG tablet Take 1 tablet (40 mg total) by mouth at bedtime.   No facility-administered encounter medications on file as of 09/22/2020.    Allergies as of 09/22/2020  . (No Known Allergies)    Past Medical History:  Diagnosis Date  . Anemia    hx of, not at this time  . Anxiety    no meds  . Aortic regurgitation   . Arthritis    "right hip; left hip; most of my joints" (05/27/2014)  . Blood transfusion without reported diagnosis    Phreesia 09/04/2020  . Cancer ([place])    Phreesia 09/04/2020  . Depression   . Diverticulosis   . Essential hypertension, benign    takes Lisinopril-HCTZ daily  . Heart murmur   . High cholesterol    takes Pravastatin daily  . History of blood transfusion    no abnormal reaction noted 2015  . History of colon polyps    benign  . Hyperlipidemia    Phreesia 09/04/2020  . Hypertension    Phreesia 09/04/2020  . Insomnia    doesn't take any meds  . Joint pain   . Neuromuscular disorder ([place])   . Neuropathy    since chemo and radiation  . Nocturia   . PONV (postoperative nausea and vomiting)    hallucinations  . Rectosigmoid cancer ([place]) 2004    S/P Surgery, Chemo, Rad Tx,  (after chemo treatment menstral periods have ceased).    Past Surgical History:  Procedure Laterality Date  . ANAL RECTAL MANOMETRY N/A 07/17/2017   Procedure:  ANO RECTAL MANOMETRY;  Surgeon: Mauri Pole, MD;  [facility];  Service: Endoscopy;  Laterality: N/A;  . COLECTOMY  Aug 2004   S/P Low Anterior Resxn (Sigmoid Colon)  . COLON SURGERY N/A    Phreesia 09/04/2020  . COLONOSCOPY    . DILATION AND CURETTAGE OF UTERUS  1991  . JOINT REPLACEMENT N/A    Phreesia 09/04/2020  . LUMBAR DISC SURGERY  Feb 2007  . SPINE SURGERY N/A    Phreesia 09/04/2020  . TOTAL HIP ARTHROPLASTY Right 05/27/2014   Procedure: RIGHT TOTAL HIP ARTHROPLASTY  ANTERIOR APPROACH;  Surgeon: Hessie Dibble, MD;  Location: [facility];  Service: Orthopedics;  Laterality: Right;  . TOTAL HIP ARTHROPLASTY Left 08/25/2015   Procedure: TOTAL HIP ARTHROPLASTY ANTERIOR APPROACH;  Surgeon: Melrose Nakayama, MD;  Location: [facility];  Service: Orthopedics;  Laterality: Left;    Family History  Adopted: Yes    Social History   Socioeconomic History  . Marital status: Married    Spouse name: Nicole Kindred  . Number of children: 1  . Years of education: Not on file  . Highest education level: Not on file  Occupational History  . Occupation: office work  Tobacco Use  . Smoking status: Former Smoker    Types: Cigarettes  . Smokeless tobacco: Never Used  . Tobacco comment: "smoked some socially in my college days"  Vaping Use  . Vaping Use: Never used  Substance and Sexual Activity  . Alcohol use: Yes    Comment: occasionally beer/wine  . Drug use: No  . Sexual activity: Yes    Partners: Male    Birth control/protection: None  Other Topics Concern  . Not on file  Social History Narrative   Lives with husband   Exercise walking 2 x weekly   Patient's family history -  UNKNOWN ADOPTED   Social Determinants of Health   Financial Resource Strain: Not on file  Food Insecurity: Not on file  Transportation Needs: Not on file  Physical Activity: Not on file  Stress: Not on file  Social Connections: Not on file  Intimate Partner Violence: Not on file      Review of systems: All other review of systems negative except as mentioned in the HPI.   Physical Exam: Vitals:   09/22/20 0816  BP: 138/66  Pulse: 80   Body mass index is 26.75 kg/m. Gen:      No acute distress HEENT:  sclera anicteric Abd:      soft, non-tender; no palpable masses, no distension Ext:    No edema Neuro: alert and oriented x 3 Psych: normal mood and affect Rectal exam: Normal anal sphincter tone, no anal fissure or external hemorrhoids.  Pellets and hard stool in the rectal  vault, paradoxical contraction of the anal sphincter during attempted defecation  Data Reviewed:  Reviewed labs, radiology imaging, old records and pertinent past GI work up   Assessment and Plan/Recommendations:  65 year old very pleasant female with history of colon cancer s/p resection with chronic constipation secondary to outlet dysfunction and dyssynergic defecation  Advised patient to increase Benefiber to 1 teaspoon 3 times daily with meals Increase water intake to 8 to 10 cups daily  Will refer to pelvic floor physical therapy for biofeedback to help relax the anal sphincter during defecation.  She has adequate intrarectal pressure generation  She is up-to-date with colorectal cancer surveillance, due for surveillance colonoscopy in October 2025  Return in 6 to 12 months or sooner if  needed  This visit required 30 minutes of patient care (this includes precharting, chart review, review of results, face-to-face time used for counseling as well as treatment plan and follow-up. The patient was provided an opportunity to ask questions and all were answered. The patient agreed with the plan and demonstrated an understanding of the instructions.  Damaris Hippo , MD    CC: Jacelyn Pi, Lilia Argue, *

## 2020-09-22 NOTE — Patient Instructions (Signed)
We will refer you back to [facility] Counts for PT, they will contact you with your appointments  Follow up in 6-12 months  Due to recent changes in healthcare laws, you may see the results of your imaging and laboratory studies on MyChart before your provider has had a chance to review them.  We understand that in some cases there may be results that are confusing or concerning to you. Not all laboratory results come back in the same time frame and the provider may be waiting for multiple results in order to interpret others.  Please give [name] 48 hours in order for your provider to thoroughly review all the results before contacting the office for clarification of your results.   Thank you for choosing [facility]  Karleen Hampshire Nandigam,MD

## 2020-09-25 ENCOUNTER — Other Ambulatory Visit: Payer: Self-pay | Admitting: Cardiology

## 2020-09-25 ENCOUNTER — Other Ambulatory Visit: Payer: Self-pay | Admitting: Family Medicine

## 2020-09-25 DIAGNOSIS — M4722 Other spondylosis with radiculopathy, cervical region: Secondary | ICD-10-CM

## 2020-11-05 ENCOUNTER — Ambulatory Visit: Payer: 59 | Attending: Gastroenterology | Admitting: Physical Therapy

## 2020-11-05 ENCOUNTER — Other Ambulatory Visit: Payer: Self-pay

## 2020-11-05 ENCOUNTER — Encounter: Payer: Self-pay | Admitting: Physical Therapy

## 2020-11-05 DIAGNOSIS — R278 Other lack of coordination: Secondary | ICD-10-CM | POA: Diagnosis present

## 2020-11-05 DIAGNOSIS — R252 Cramp and spasm: Secondary | ICD-10-CM | POA: Insufficient documentation

## 2020-11-05 DIAGNOSIS — M6281 Muscle weakness (generalized): Secondary | ICD-10-CM | POA: Insufficient documentation

## 2020-11-05 DIAGNOSIS — K5900 Constipation, unspecified: Secondary | ICD-10-CM | POA: Insufficient documentation

## 2020-11-05 NOTE — Therapy (Signed)
[facility] [address] [place], [place], [address] Phone: [phone]   Fax:  [phone]  Physical Therapy Evaluation  Patient Details  Name: [name] [name] MRN: [id number] Date of Birth: 09-09-1955 Referring Provider (PT): Dr. Harl Bowie   Encounter Date: 11/05/2020   PT End of Session - 11/05/20 1317    Visit Number 1    Date for PT Re-Evaluation 03/07/21    Authorization Type UHC    PT Start Time 1230    PT Stop Time 1315    PT Time Calculation (min) 45 min    Activity Tolerance Patient tolerated treatment well    Behavior During Therapy [facility] for tasks assessed/performed           Past Medical History:  Diagnosis Date  . Anemia    hx of, not at this time  . Anxiety    no meds  . Aortic regurgitation   . Arthritis    "right hip; left hip; most of my joints" (05/27/2014)  . Blood transfusion without reported diagnosis    Phreesia 09/04/2020  . Cancer ([place])    Phreesia 09/04/2020  . Depression   . Diverticulosis   . Essential hypertension, benign    takes Lisinopril-HCTZ daily  . Heart murmur   . High cholesterol    takes Pravastatin daily  . History of blood transfusion    no abnormal reaction noted 2015  . History of colon polyps    benign  . Hyperlipidemia    Phreesia 09/04/2020  . Hypertension    Phreesia 09/04/2020  . Insomnia    doesn't take any meds  . Joint pain   . Neuromuscular disorder ([place])   . Neuropathy    since chemo and radiation  . Nocturia   . PONV (postoperative nausea and vomiting)    hallucinations  . Rectosigmoid cancer ([place]) 2004    S/P Surgery, Chemo, Rad Tx,  (after chemo treatment menstral periods have ceased).    Past Surgical History:  Procedure Laterality Date  . ANAL RECTAL MANOMETRY N/A 07/17/2017   Procedure: ANO RECTAL MANOMETRY;  Surgeon: Mauri Pole, MD;  [facility];  Service: Endoscopy;  Laterality: N/A;  . COLECTOMY  Aug  2004   S/P Low Anterior Resxn (Sigmoid Colon)  . COLON SURGERY N/A    Phreesia 09/04/2020  . COLONOSCOPY    . DILATION AND CURETTAGE OF UTERUS  1991  . JOINT REPLACEMENT N/A    Phreesia 09/04/2020  . LUMBAR DISC SURGERY  Feb 2007  . SPINE SURGERY N/A    Phreesia 09/04/2020  . TOTAL HIP ARTHROPLASTY Right 05/27/2014   Procedure: RIGHT TOTAL HIP ARTHROPLASTY ANTERIOR APPROACH;  Surgeon: Hessie Dibble, MD;  Location: [facility];  Service: Orthopedics;  Laterality: Right;  . TOTAL HIP ARTHROPLASTY Left 08/25/2015   Procedure: TOTAL HIP ARTHROPLASTY ANTERIOR APPROACH;  Surgeon: Melrose Nakayama, MD;  Location: [facility];  Service: Orthopedics;  Laterality: Left;    There were no vitals filed for this visit.    Subjective Assessment - 11/05/20 1236    Subjective Patient is having trouble with constipation. She is using Linzess. Patient works from home. does not feel like she is fully emptying her stool. Patient likes she is clogged up. When she is constipated she feels little dizzy. No pain in the rectal. Back pain stops her from doing her walks.    Patient Stated Goals be able to go to the bathroom    Currently  in Pain? Yes    Pain Score 4     Pain Location Back    Pain Orientation Lower    Pain Descriptors / Indicators Dull    Pain Onset More than a month ago    Pain Frequency Intermittent    Aggravating Factors  when she is constipated,    Pain Relieving Factors have a bowel movement    Multiple Pain Sites Yes    Pain Score 2    Pain Location Abdomen    Pain Orientation Anterior    Pain Descriptors / Indicators Tender    Pain Type Chronic pain    Pain Onset More than a month ago    Pain Frequency Constant    Aggravating Factors  bloated from constipation,    Pain Relieving Factors abdominal massage              [facility] PT Assessment - 11/05/20 0001      Assessment   Medical Diagnosis K59.02 Constipation, outlet dysfunction; K59.02 Dyssynergic defecation    Referring Provider  (PT) Dr. Karleen Hampshire Nandigam    Onset Date/Surgical Date --   15 years ago when she had cancer   Prior Therapy pelvic floor PT      Precautions   Precautions Other (comment)    Precaution Comments cancer      Restrictions   Weight Bearing Restrictions No      Balance Screen   Has the patient fallen in the past 6 months No    Has the patient had a decrease in activity level because of a fear of falling?  No    Is the patient reluctant to leave their home because of a fear of falling?  No      Home Ecologist residence      Prior Function   Level of Independence Independent    Vocation Requirements works at home, sitting    Leisure has stopped walking due to feet and knees, and back hurt      Cognition   Overall Cognitive Status Within Functional Limits for tasks assessed      Posture/Postural Control   Posture/Postural Control Postural limitations    Postural Limitations Rounded Shoulders;Forward head;Increased thoracic kyphosis      ROM / Strength   AROM / PROM / Strength AROM;PROM;Strength      AROM   Overall AROM Comments decreased by 25% with extension decreased by 50%      Strength   Right Hip External Rotation  4/5    Left Hip ABduction 3+/5    Left Hip ADduction 4/5      Palpation   Palpation comment tenderness located in the abdomen                      Objective measurements completed on examination: See above findings.     Pelvic Floor Special Questions - 11/05/20 0001    Urinary Leakage Yes    Activities that cause leaking Laughing;Coughing    Urinary urgency Yes   for stool and urinary leakage   Urinary frequency BM often,    Fecal incontinence No   constipation   Fluid intake tea, water    Skin Integrity Intact    Pelvic Floor Internal Exam Patient confirms identification and approves PT to assess pelvic floor and treatment    Exam Type Rectal    Palpation tightness around the puborectalis, ishiococcygeus,  iliococcygeus, decreased movement of the coccyx  Strength fair squeeze, definite lift                      PT Short Term Goals - 11/05/20 1427      PT SHORT TERM GOAL #1   Title pt will be independent with toileting techniques and diaphragmatic breathing technique for improved intra-abdominal pressure during bowel movement    Time 4    Period Weeks    Status New    Target Date 12/03/20      PT SHORT TERM GOAL #2   Title ind with initial HEP    Time 4    Period Weeks    Status New    Target Date 12/03/20      PT SHORT TERM GOAL #3   Title able to correctly engage Transverse abdominus for improved functional activities and abdominal wall support    Time 4    Period Weeks    Status New    Target Date 12/03/20             PT Long Term Goals - 11/05/20 1428      PT LONG TERM GOAL #1   Title pt will be ind with advanced HEP    Time 12    Period Weeks    Status New    Target Date 03/07/21      PT LONG TERM GOAL #2   Title pt will have bowel movement at least 1-3 x/day with full evacuation    Baseline --    Time 12    Period Weeks    Status New    Target Date 03/07/21      PT LONG TERM GOAL #3   Title pt will be able to verbalize understanding of at least 2 techniques to relieve symptoms of constipation including abdominal massage, pelvic movements, relaxation techniques    Time 12    Period Weeks    Status New    Target Date 03/07/21      PT LONG TERM GOAL #4   Title urinary leakage decreased >/= 75% due to improve pelvic floor coordination    Baseline --    Time 12    Period Weeks    Status New    Target Date 03/07/21      PT LONG TERM GOAL #5   Title pt will report ability to perform Knack in order to control urinary leakage when coughing or sneezing    Baseline --    Time 4    Period Months    Status New    Target Date 03/07/21                  Plan - 11/05/20 1418    Clinical Impression Statement Patient is a 65 year old  female with constipation with outlet dysfunction for many years. Patient has a history of rectosigmond cancer 2004. Patient reports back pain is 4/10 when she is constipated. Abdominal pain is 2/10 throughout when she is constipated and bloated. Patient stands with increased thoracic kyphosis, forward head, and rounded shoulders. Patient has decreased ROM of her lumbar spine. She has weakness in bilateral hips. Rectal strength is 3/5 with lift for the internal anal sphincter but 0/5 for the external anal sphincter. Tenderness located in the puborectalis, iliococcygeus, ishiococcygeus, and throughout the abdomen.  Decreased movement of the coccyx. Patient is not able to push the therapist finger out of the anal canal. She has difficulty with breathing into her abdomen.  Patient reports she feels like she does not empty her rectum. She has to go to the bathroom many times. Patient will leak with coughing and laughing. Patient will benefit from skilled therapy to improve pelvic floor coordination and reduce her constipation.    Personal Factors and Comorbidities Comorbidity 3+;Age;Past/Current Experience;Time since onset of injury/illness/exacerbation    Comorbidities rectosigmoid cancer with radiation and chemotherapy 2004; bilateral anterior hip replacement; spinal surgery    Examination-Activity Limitations Toileting;Continence;Locomotion Level    Stability/Clinical Decision Making Evolving/Moderate complexity    Clinical Decision Making Low    Rehab Potential Good    PT Frequency 1x / week    PT Duration Other (comment)   4 months   PT Treatment/Interventions ADLs/Self Care Home Management;Biofeedback;Neuromuscular re-education;Therapeutic exercise;Therapeutic activities;Patient/family education;Manual techniques;Dry needling    PT Next Visit Plan manual work to the anus; diaphragmatic breathing, toileting    Consulted and Agree with Plan of Care Patient           Patient will benefit from skilled  therapeutic intervention in order to improve the following deficits and impairments:  Decreased coordination,Decreased range of motion,Increased fascial restricitons,Decreased endurance,Increased muscle spasms,Decreased activity tolerance,Pain,Impaired flexibility,Decreased strength  Visit Diagnosis: Muscle weakness (generalized) - Plan: PT plan of care cert/re-cert  Cramp and spasm - Plan: PT plan of care cert/re-cert  Other lack of coordination - Plan: PT plan of care cert/re-cert  Constipation, unspecified constipation type - Plan: PT plan of care cert/re-cert     Problem List Patient Active Problem List   Diagnosis Date Noted  . Prediabetes 03/06/2020  . Abnormal echocardiogram 05/08/2019  . Enlarged thoracic aorta ([place]) 04/17/2019  . Elevated cholesterol 02/14/2018  . Status post bilateral total hip replacement 05/27/2014  . ANXIETY DISORDER- mixed disorder w/ depressive symptoms 02/07/2014  . PERIPHERAL NEUROPATHY 10/13/2007  . Essential hypertension 10/13/2007  . HERNIA, UMBILICAL 45/99/7741  . COLONIC POLYPS, ADENOMATOUS 02/20/2003  . DIVERTICULOSIS, COLON 02/20/2003  . ADENOCARCINOMA, COLON, SIGMOID, HX OF 02/20/2003    Earlie Counts, PT 11/05/20 2:34 PM   [facility] [address] [place], [place], [address] Phone: [phone]   Fax:  [phone]  Name: SHALAYNE LEACH MRN: [id number] Date of Birth: [birth year]

## 2020-11-29 ENCOUNTER — Other Ambulatory Visit: Payer: Self-pay | Admitting: Gastroenterology

## 2020-12-16 ENCOUNTER — Encounter: Payer: 59 | Admitting: Physical Therapy

## 2020-12-21 DIAGNOSIS — I10 Essential (primary) hypertension: Secondary | ICD-10-CM

## 2020-12-31 ENCOUNTER — Encounter: Payer: 59 | Admitting: Physical Therapy

## 2021-01-09 ENCOUNTER — Other Ambulatory Visit: Payer: Self-pay | Admitting: Emergency Medicine

## 2021-01-09 DIAGNOSIS — M4722 Other spondylosis with radiculopathy, cervical region: Secondary | ICD-10-CM

## 2021-01-13 ENCOUNTER — Other Ambulatory Visit: Payer: Self-pay | Admitting: Emergency Medicine

## 2021-01-13 DIAGNOSIS — M4722 Other spondylosis with radiculopathy, cervical region: Secondary | ICD-10-CM

## 2021-01-14 ENCOUNTER — Other Ambulatory Visit: Payer: Self-pay

## 2021-01-14 ENCOUNTER — Ambulatory Visit: Payer: 59 | Attending: Gastroenterology | Admitting: Physical Therapy

## 2021-01-14 DIAGNOSIS — R252 Cramp and spasm: Secondary | ICD-10-CM | POA: Insufficient documentation

## 2021-01-14 DIAGNOSIS — M6281 Muscle weakness (generalized): Secondary | ICD-10-CM | POA: Diagnosis present

## 2021-01-14 DIAGNOSIS — K5900 Constipation, unspecified: Secondary | ICD-10-CM | POA: Diagnosis present

## 2021-01-14 DIAGNOSIS — R278 Other lack of coordination: Secondary | ICD-10-CM | POA: Insufficient documentation

## 2021-01-14 MED ORDER — SIMVASTATIN 40 MG PO TABS: ORAL_TABLET | ORAL | 0 refills | Status: DC

## 2021-01-14 NOTE — Patient Instructions (Signed)
About Abdominal Massage  Abdominal massage, also called external colon massage, is a self-treatment circular massage technique that can reduce and eliminate gas and ease constipation. The colon naturally contracts in waves in a clockwise direction starting from inside the right hip, moving up toward the ribs, across the belly, and down inside the left hip.  When you perform circular abdominal massage, you help stimulate your colon's normal wave pattern of movement called peristalsis.  It is most beneficial when done after eating.  Positioning You can practice abdominal massage with oil while lying down, or in the shower with soap.  Some people find that it is just as effective to do the massage through clothing while sitting or standing.  How to Massage Start by placing your finger tips or knuckles on your right side, just inside your hip bone.  . Make small circular movements while you move upward toward your rib cage.   . Once you reach the bottom right side of your rib cage, take your circular movements across to the left side of the bottom of your rib cage.  . Next, move downward until you reach the inside of your left hip bone.  This is the path your feces travel in your colon. . Continue to perform your abdominal massage in this pattern for 10 minutes each day.     You can apply as much pressure as is comfortable in your massage.  Start gently and build pressure as you continue to practice.  Notice any areas of pain as you massage; areas of slight pain may be relieved as you massage, but if you have areas of significant or intense pain, consult with your healthcare provider.  Other Considerations . General physical activity including bending and stretching can have a beneficial massage-like effect on the colon.  Deep breathing can also stimulate the colon because breathing deeply activates the same nervous system that supplies the colon.   . Abdominal massage should always be used in  combination with a bowel-conscious diet that is high in the proper type of fiber for you, fluids (primarily water), and a regular exercise program. [facility] [address], [address] [place], [place] [address] Phone # [phone] Fax [phone]  

## 2021-01-14 NOTE — Therapy (Signed)
[facility] [address], [place], [place], [address] Phone: [phone]   Fax:  [phone]  Physical Therapy Treatment  Patient Details  Name: [name] [name] MRN: [id number] Date of Birth: [birth year] Referring Provider (PT): Dr. Harl Bowie   Encounter Date: 01/14/2021   PT End of Session - 01/14/21 1317     Visit Number 2    Date for PT Re-Evaluation 03/07/21    Authorization Type UHC    PT Start Time 9924    PT Stop Time 1308    PT Time Calculation (min) 38 min    Activity Tolerance Patient tolerated treatment well    Behavior During Therapy [facility] for tasks assessed/performed             Past Medical History:  Diagnosis Date   Anemia    hx of, not at this time   Anxiety    no meds   Aortic regurgitation    Arthritis    "right hip; left hip; most of my joints" (05/27/2014)   Blood transfusion without reported diagnosis    Phreesia 09/04/2020   Cancer ([place])    Phreesia 09/04/2020   Depression    Diverticulosis    Essential hypertension, benign    takes Lisinopril-HCTZ daily   Heart murmur    High cholesterol    takes Pravastatin daily   History of blood transfusion    no abnormal reaction noted 2015   History of colon polyps    benign   Hyperlipidemia    Phreesia 09/04/2020   Hypertension    Phreesia 09/04/2020   Insomnia    doesn't take any meds   Joint pain    Neuromuscular disorder ([place])    Neuropathy    since chemo and radiation   Nocturia    PONV (postoperative nausea and vomiting)    hallucinations   Rectosigmoid cancer ([place]) 2004    S/P Surgery, Chemo, Rad Tx,  (after chemo treatment menstral periods have ceased).    Past Surgical History:  Procedure Laterality Date   ANAL RECTAL MANOMETRY N/A 07/17/2017   Procedure: ANO RECTAL MANOMETRY;  Surgeon: Mauri Pole, MD;  Location: [facility];  Service: Endoscopy;  Laterality: N/A;   COLECTOMY  Aug 2004   S/P Low  Anterior Resxn (Sigmoid Colon)   COLON SURGERY N/A    Phreesia 09/04/2020   COLONOSCOPY     DILATION AND CURETTAGE OF UTERUS  1991   JOINT REPLACEMENT N/A    Phreesia 09/04/2020   LUMBAR [place] SURGERY  Feb 2007   SPINE SURGERY N/A    Phreesia 09/04/2020   TOTAL HIP ARTHROPLASTY Right 05/27/2014   Procedure: RIGHT TOTAL HIP ARTHROPLASTY ANTERIOR APPROACH;  Surgeon: Hessie Dibble, MD;  Location: [place];  Service: Orthopedics;  Laterality: Right;   TOTAL HIP ARTHROPLASTY Left 08/25/2015   Procedure: TOTAL HIP ARTHROPLASTY ANTERIOR APPROACH;  Surgeon: Melrose Nakayama, MD;  Location: [place];  Service: Orthopedics;  Laterality: Left;    There were no vitals filed for this visit.   Subjective Assessment - 01/14/21 1227     Subjective My stomach feels terrible today. Some of the breathing has helped. When you placed your finger in the anus did not help and did not have to go tot the bathroom for several days.    Patient Stated Goals be able to go to the bathroom    Currently in Pain? Yes    Pain Score 6     Pain  Location Back    Pain Orientation Lower    Pain Descriptors / Indicators Dull    Pain Type Chronic pain    Pain Onset More than a month ago    Pain Frequency Intermittent    Aggravating Factors  when she is constipated    Pain Relieving Factors having a bowel movement                               [facility] Adult PT Treatment/Exercise - 01/14/21 0001       Manual Therapy   Manual Therapy Myofascial release;Soft tissue mobilization    Manual therapy comments educated patient on how to perform manual work to the abdomen to improve peristalic motion    Soft tissue mobilization manual mobilization to lumbar paraspinals, quadratus and along the lateral trunk. Manual work on bilateral diaphragm; scar mobilization of the lower abadomen    Myofascial Release using the suction cup to lift up the fascial to release the tissue; quadruped pulling the soft tissue from the  back tot he abdomen 10 times; fascial release around the sac of douglas, mesenteric root, iliocecal valve, release around the umbilicus with leg movement; release in the upper midline of the abdomen                    PT Education - 01/14/21 1313     Education Details education on abdominal massage    Person(s) Educated Patient    Methods Explanation;Demonstration;Handout    Comprehension Returned demonstration;Verbalized understanding              PT Short Term Goals - 11/05/20 1427       PT SHORT TERM GOAL #1   Title pt will be independent with toileting techniques and diaphragmatic breathing technique for improved intra-abdominal pressure during bowel movement    Time 4    Period Weeks    Status New    Target Date 12/03/20      PT SHORT TERM GOAL #2   Title ind with initial HEP    Time 4    Period Weeks    Status New    Target Date 12/03/20      PT SHORT TERM GOAL #3   Title able to correctly engage Transverse abdominus for improved functional activities and abdominal wall support    Time 4    Period Weeks    Status New    Target Date 12/03/20               PT Long Term Goals - 11/05/20 1428       PT LONG TERM GOAL #1   Title pt will be ind with advanced HEP    Time 12    Period Weeks    Status New    Target Date 03/07/21      PT LONG TERM GOAL #2   Title pt will have bowel movement at least 1-3 x/day with full evacuation    Baseline --    Time 12    Period Weeks    Status New    Target Date 03/07/21      PT LONG TERM GOAL #3   Title pt will be able to verbalize understanding of at least 2 techniques to relieve symptoms of constipation including abdominal massage, pelvic movements, relaxation techniques    Time 12    Period Weeks    Status New    Target  Date 03/07/21      PT LONG TERM GOAL #4   Title urinary leakage decreased >/= 75% due to improve pelvic floor coordination    Baseline --    Time 12    Period Weeks    Status  New    Target Date 03/07/21      PT LONG TERM GOAL #5   Title pt will report ability to perform Knack in order to control urinary leakage when coughing or sneezing    Baseline --    Time 4    Period Months    Status New    Target Date 03/07/21                   Plan - 01/14/21 1318     Clinical Impression Statement Patient is constipated today and bloated. After treatment she had increased abdominal softness and bowel sounds. Patient has tightness in the lower abdomen where her scar is. She has low tone in the abdomen. Patient has not been in therapy for 2 months due to the therapist having limited openings and her work schedule. Patient has not met goals at this time due to only having an eval and treatment. Patient will benefit from skilled therapy to improve pelvic floor coordination and reduce her constipation.    Personal Factors and Comorbidities Comorbidity 3+;Age;Past/Current Experience;Time since onset of injury/illness/exacerbation    Comorbidities rectosigmoid cancer with radiation and chemotherapy 2004; bilateral anterior hip replacement; spinal surgery    Examination-Activity Limitations Toileting;Continence;Locomotion Level    Stability/Clinical Decision Making Evolving/Moderate complexity    Rehab Potential Good    PT Frequency 1x / week    PT Duration Other (comment)   4 months   PT Treatment/Interventions ADLs/Self Care Home Management;Biofeedback;Neuromuscular re-education;Therapeutic exercise;Therapeutic activities;Patient/family education;Manual techniques;Dry needling    PT Next Visit Plan diaphragmatic breathing, toileting, work vaginally to get the pelvic floor muscles    Recommended Other Services MD signed initial note    Consulted and Agree with Plan of Care Patient             Patient will benefit from skilled therapeutic intervention in order to improve the following deficits and impairments:  Decreased coordination, Decreased range of motion,  Increased fascial restricitons, Decreased endurance, Increased muscle spasms, Decreased activity tolerance, Pain, Impaired flexibility, Decreased strength  Visit Diagnosis: Muscle weakness (generalized)  Cramp and spasm  Other lack of coordination  Constipation, unspecified constipation type     Problem List Patient Active Problem List   Diagnosis Date Noted   Prediabetes 03/06/2020   Abnormal echocardiogram 05/08/2019   Enlarged thoracic aorta ([place]) 04/17/2019   Elevated cholesterol 02/14/2018   Status post bilateral total hip replacement 05/27/2014   ANXIETY DISORDER- mixed disorder w/ depressive symptoms 02/07/2014   PERIPHERAL NEUROPATHY 10/13/2007   Essential hypertension 09/38/1829   HERNIA, UMBILICAL 93/71/6967   COLONIC POLYPS, ADENOMATOUS 02/20/2003   DIVERTICULOSIS, COLON 02/20/2003   ADENOCARCINOMA, COLON, SIGMOID, HX OF 02/20/2003    [name] [name], PT 01/14/21 1:25 PM  [facility] [address], [place], [place], [address] Phone: [phone]   Fax:  [phone]  Name: [name] [name] MRN: [id number] Date of Birth: 01/05/1956

## 2021-01-15 ENCOUNTER — Telehealth: Payer: Self-pay

## 2021-01-15 ENCOUNTER — Other Ambulatory Visit: Payer: Self-pay | Admitting: Emergency Medicine

## 2021-01-15 DIAGNOSIS — M4722 Other spondylosis with radiculopathy, cervical region: Secondary | ICD-10-CM

## 2021-01-15 LAB — COMPREHENSIVE METABOLIC PANEL
ALT: 20 IU/L (ref 0–32)
AST: 19 IU/L (ref 0–40)
Albumin/Globulin Ratio: 1.7 (ref 1.2–2.2)
Albumin: 4.3 g/dL (ref 3.8–4.8)
Alkaline Phosphatase: 62 IU/L (ref 44–121)
BUN/Creatinine Ratio: 15 (ref 12–28)
BUN: 9 mg/dL (ref 8–27)
Bilirubin Total: 0.4 mg/dL (ref 0.0–1.2)
CO2: 23 mmol/L (ref 20–29)
Calcium: 9.1 mg/dL (ref 8.7–10.3)
Chloride: 96 mmol/L (ref 96–106)
Creatinine, Ser: 0.62 mg/dL (ref 0.57–1.00)
Globulin, Total: 2.6 g/dL (ref 1.5–4.5)
Glucose: 126 mg/dL — ABNORMAL HIGH (ref 65–99)
Potassium: 4.4 mmol/L (ref 3.5–5.2)
Sodium: 134 mmol/L (ref 134–144)
Total Protein: 6.9 g/dL (ref 6.0–8.5)
eGFR: 99 mL/min/{1.73_m2} (ref >59)

## 2021-01-15 LAB — LIPID PANEL
Chol/HDL Ratio: 2.4 ratio (ref 0.0–4.4)
Cholesterol, Total: 164 mg/dL (ref 100–199)
HDL: 68 mg/dL (ref >39)
LDL Chol Calc (NIH): 67 mg/dL (ref 0–99)
Triglycerides: 173 mg/dL — ABNORMAL HIGH (ref 0–149)
VLDL Cholesterol Cal: 29 mg/dL (ref 5–40)

## 2021-01-15 NOTE — Telephone Encounter (Signed)
-----   Message from Richardo Priest, MD sent at 01/15/2021  7:55 AM EDT ----- Normal or stable result  Good result no changes

## 2021-01-15 NOTE — Telephone Encounter (Signed)
Spoke with patient regarding results and recommendation.  Patient verbalizes understanding and is agreeable to plan of care. Advised patient to call back with any issues or concerns.  

## 2021-01-15 NOTE — Telephone Encounter (Signed)
Tried calling patient. No answer and no voicemail set up for me to leave a message. 

## 2021-01-16 ENCOUNTER — Telehealth: Payer: Self-pay | Admitting: Emergency Medicine

## 2021-01-16 DIAGNOSIS — M4722 Other spondylosis with radiculopathy, cervical region: Secondary | ICD-10-CM

## 2021-01-19 ENCOUNTER — Ambulatory Visit (INDEPENDENT_AMBULATORY_CARE_PROVIDER_SITE_OTHER)
Admission: RE | Admit: 2021-01-19 | Discharge: 2021-01-19 | Disposition: A | Discharge status: Home / Self Care | Payer: 59 | Source: Ambulatory Visit | Attending: Cardiology | Admitting: Cardiology

## 2021-01-19 ENCOUNTER — Other Ambulatory Visit: Payer: Self-pay

## 2021-01-19 DIAGNOSIS — I712 Thoracic aortic aneurysm, without rupture, unspecified: Secondary | ICD-10-CM

## 2021-01-19 MED ORDER — IOHEXOL 350 MG/ML SOLN
100.0000 mL | Freq: Once | INTRAVENOUS | Status: AC | PRN
  Administered 2021-01-19: 100 mL via INTRAVENOUS

## 2021-01-20 NOTE — Telephone Encounter (Signed)
   Patient is requesting refill for  meloxicam (MOBIC) 15 MG tablet  [id number] - [place], [place] - [address]. Patient is out of medication and is requesting a call back

## 2021-01-21 ENCOUNTER — Other Ambulatory Visit: Payer: Self-pay | Admitting: Family Medicine

## 2021-01-21 DIAGNOSIS — M4722 Other spondylosis with radiculopathy, cervical region: Secondary | ICD-10-CM

## 2021-01-21 MED ORDER — MELOXICAM 15 MG PO TABS: 15.0000 mg | ORAL_TABLET | Freq: Every day | ORAL | 0 refills | Status: DC

## 2021-01-21 NOTE — Addendum Note (Signed)
Addended by: Durwin Nora on: 01/21/2021 03:38 PM   Modules accepted: Orders

## 2021-01-21 NOTE — Telephone Encounter (Signed)
Medication refilled

## 2021-01-21 NOTE — Telephone Encounter (Signed)
   Patient requesting  status of refill Advised to allow more time

## 2021-01-22 ENCOUNTER — Other Ambulatory Visit: Payer: Self-pay | Admitting: Emergency Medicine

## 2021-01-22 DIAGNOSIS — K760 Fatty (change of) liver, not elsewhere classified: Secondary | ICD-10-CM

## 2021-01-28 ENCOUNTER — Other Ambulatory Visit: Payer: Self-pay

## 2021-01-28 ENCOUNTER — Encounter: Payer: Self-pay | Admitting: Physical Therapy

## 2021-01-28 ENCOUNTER — Ambulatory Visit: Payer: 59 | Admitting: Physical Therapy

## 2021-01-28 DIAGNOSIS — K5900 Constipation, unspecified: Secondary | ICD-10-CM

## 2021-01-28 DIAGNOSIS — M6281 Muscle weakness (generalized): Secondary | ICD-10-CM

## 2021-01-28 DIAGNOSIS — R252 Cramp and spasm: Secondary | ICD-10-CM

## 2021-01-28 DIAGNOSIS — R278 Other lack of coordination: Secondary | ICD-10-CM

## 2021-01-28 NOTE — Patient Instructions (Signed)
Access Code: [email or web address] URL: [email or web address] Date: 01/28/2021 Prepared by: Earlie Counts  Exercises Supine Figure 4 Piriformis Stretch - 1 x daily - 7 x weekly - 3 sets - 10 reps Supine Hamstring Stretch - 1 x daily - 7 x weekly - 3 sets - 10 reps Supine Lower Trunk Rotation - 1 x daily - 7 x weekly - 3 sets - 10 reps  Toileting Techniques for Bowel Movements    An Evacuation/Defecation Plan   Here are the 4 basic points:  Lean forward enough for your elbows to rest on your knees Support your feet on the floor or use a low stool if your feet don't touch the floor  Push out your belly as if you have swallowed a beach ball--you should feel a widening of your waist. "Belly Big, Belly Hard" Open and relax your pelvic floor muscles, rather than tightening around the anus  While you are sitting on the toilet pay attention to the following areas: Jaw and mouth position- relaxed not clenched Angle of your hips - leaning slightly forward Whether your feet touch the ground or not - should be flat and supported Arm placement - rest against your thighs Spine position - flat back Waist Breathing - exhale as you push (like blowing up a balloon or try using other sounds such as ahhhh, shhhhh, ohhhh or grrrrrrr) Beulah Gandy - hard and tight as you push Anus (opening of the anal canal) - relaxed and open as you push Anus - Tighten and lift pulling the muscle back in after you are done or if taking a break  If you are not successful after 10-15 minutes, try again later.  Avoid negative self-talk about your toileting experience.   Read this for more details and ask your PT if you need suggestions for adjustments or limitations:  Sitting on the toilet  a) Make sure your feet are supported - flat on the floor or step stool b) Many people find it effective to lean forward or raise their knees.  Propping your feet on a step stool (Squatty Potty is a brand name) can help the muscles around  the anus to relax  c) When you lean forward, place your forearms on your thighs for support  Relaxing Breathe deeply and slowly in through your nose and out through your mouth. To become aware of how to relax your muscles, contracting and releasing muscles can be helpful.  Pull your pelvic floor muscles in tightly by using the image of holding back gas, or closing around the anus (visualize making a circle smaller) and lifting the anus up and in.  Then release the muscles and your anus should drop down and feel open. Repeat 5 times ending with the feeling of relaxation. Keep your pelvic floor muscles relaxed; let your belly bulge out. The digestive tract starts at the mouth and ends at the anal opening, so be sure to relax both ends of the tube.  Place your tongue on the roof of your mouth with your teeth separated.  This helps relax your mouth and will help to relax the anus at the same time.  Emptying (defecation) a) Keep your pelvic floor and sphincter relaxed, then bulge your anal muscles.  Make the anal opening wide.  b) Stick your belly out as if you have swallowed a beach ball. c) Make your belly wall hard using your belly muscles while continuing to breathe. Doing this makes it easier to open your anus. d) Breath out and give  a grunt (or try using other sounds such as ahhhh, shhhhh, ohhhh or grrrrrrr). e)  Can also try to act as if you are blowing up a balloon as you push  4) Finishing a) As you finish your bowel movement, pull the pelvic floor muscles up and in.  This will leave your anus in the proper place rather than remaining pushed out and down. If you leave your anus pushed out and down, it will start to feel as though that is normal and give you incorrect signals about needing to have a bowel movement.   [facility] [address] [place], [place] [address] Phone # [phone] Fax [phone]

## 2021-01-28 NOTE — Therapy (Signed)
[facility] [address], [place], [place], [address] Phone: [phone]   Fax:  [phone]  Physical Therapy Treatment  Patient Details  Name: [name] [name] MRN: [id number] Date of Birth: 1955-10-29 Referring Provider (PT): Dr. Harl Bowie   Encounter Date: 01/28/2021   PT End of Session - 01/28/21 1358     Visit Number 3    Date for PT Re-Evaluation 03/07/21    Authorization Type UHC    PT Start Time 7564    PT Stop Time 3329   electricity went out and EPIC went down so did not do as much   PT Time Calculation (min) 38 min    Activity Tolerance Patient tolerated treatment well    Behavior During Therapy [facility] for tasks assessed/performed             Past Medical History:  Diagnosis Date   Anemia    hx of, not at this time   Anxiety    no meds   Aortic regurgitation    Arthritis    "right hip; left hip; most of my joints" (05/27/2014)   Blood transfusion without reported diagnosis    Phreesia 09/04/2020   Cancer ([place])    Phreesia 09/04/2020   Depression    Diverticulosis    Essential hypertension, benign    takes Lisinopril-HCTZ daily   Heart murmur    High cholesterol    takes Pravastatin daily   History of blood transfusion    no abnormal reaction noted 2015   History of colon polyps    benign   Hyperlipidemia    Phreesia 09/04/2020   Hypertension    Phreesia 09/04/2020   Insomnia    doesn't take any meds   Joint pain    Neuromuscular disorder ([place])    Neuropathy    since chemo and radiation   Nocturia    PONV (postoperative nausea and vomiting)    hallucinations   Rectosigmoid cancer ([place]) 2004    S/P Surgery, Chemo, Rad Tx,  (after chemo treatment menstral periods have ceased).    Past Surgical History:  Procedure Laterality Date   ANAL RECTAL MANOMETRY N/A 07/17/2017   Procedure: ANO RECTAL MANOMETRY;  Surgeon: Mauri Pole, MD;  [facility];  Service:  Endoscopy;  Laterality: N/A;   COLECTOMY  Aug 2004   S/P Low Anterior Resxn (Sigmoid Colon)   COLON SURGERY N/A    Phreesia 09/04/2020   COLONOSCOPY     DILATION AND CURETTAGE OF UTERUS  1991   JOINT REPLACEMENT N/A    Phreesia 09/04/2020   LUMBAR [place] SURGERY  Feb 2007   SPINE SURGERY N/A    Phreesia 09/04/2020   TOTAL HIP ARTHROPLASTY Right 05/27/2014   Procedure: RIGHT TOTAL HIP ARTHROPLASTY ANTERIOR APPROACH;  Surgeon: Hessie Dibble, MD;  Location: [place];  Service: Orthopedics;  Laterality: Right;   TOTAL HIP ARTHROPLASTY Left 08/25/2015   Procedure: TOTAL HIP ARTHROPLASTY ANTERIOR APPROACH;  Surgeon: Melrose Nakayama, MD;  Location: [place];  Service: Orthopedics;  Laterality: Left;    There were no vitals filed for this visit.   Subjective Assessment - 01/28/21 1235     Subjective I had a terrible time after last visit. I was sore in the abdomen. I felt terrible for 3 days. I did not have a bowel movment right afterwards. I am not fully emptying my bowels. No abdominal pain today and only had a small bowel movement.    Patient  Stated Goals be able to go to the bathroom    Currently in Pain? Yes    Pain Score 5     Pain Location Back    Pain Orientation Lower    Pain Descriptors / Indicators Dull    Pain Type Chronic pain    Pain Onset More than a month ago    Pain Frequency Intermittent    Aggravating Factors  when sheis constipated    Pain Relieving Factors having a bowel movement    Multiple Pain Sites No                               [facility] Adult PT Treatment/Exercise - 01/28/21 0001       Therapeutic Activites    Therapeutic Activities Other Therapeutic Activities    Other Therapeutic Activities education on correct toileting with knees above hips, correct breathing, relaxation of the face, and how to breath out to contract the lower abdominals and relax the pelvic floor      Neuro Re-ed    Neuro Re-ed Details  diaphragmatic breating in sitting  with therapist giving tactile cues to not lift the shoulders, relax the neck and breath into the abdomen      Lumbar Exercises: Stretches   Active Hamstring Stretch Right;Left;1 rep;30 seconds    Active Hamstring Stretch Limitations supine    Lower Trunk Rotation 2 reps   right, left   Lower Trunk Rotation Limitations supine    Piriformis Stretch Right;Left;1 rep;30 seconds    Piriformis Stretch Limitations supine      Manual Therapy   Manual Therapy Soft tissue mobilization    Soft tissue mobilization using the addaday around the right piriformis and posterior greater trochanter                    PT Education - 01/28/21 1357     Education Details education on correct toileting; hip stretches; Access Code: [email or web address]    Person(s) Educated Patient    Methods Explanation;Demonstration;Verbal cues;Handout    Comprehension Returned demonstration;Verbalized understanding              PT Short Term Goals - 11/05/20 1427       PT SHORT TERM GOAL #1   Title pt will be independent with toileting techniques and diaphragmatic breathing technique for improved intra-abdominal pressure during bowel movement    Time 4    Period Weeks    Status New    Target Date 12/03/20      PT SHORT TERM GOAL #2   Title ind with initial HEP    Time 4    Period Weeks    Status New    Target Date 12/03/20      PT SHORT TERM GOAL #3   Title able to correctly engage Transverse abdominus for improved functional activities and abdominal wall support    Time 4    Period Weeks    Status New    Target Date 12/03/20               PT Long Term Goals - 11/05/20 1428       PT LONG TERM GOAL #1   Title pt will be ind with advanced HEP    Time 12    Period Weeks    Status New    Target Date 03/07/21      PT LONG TERM GOAL #2   Title pt  will have bowel movement at least 1-3 x/day with full evacuation    Baseline --    Time 12    Period Weeks    Status New    Target Date  03/07/21      PT LONG TERM GOAL #3   Title pt will be able to verbalize understanding of at least 2 techniques to relieve symptoms of constipation including abdominal massage, pelvic movements, relaxation techniques    Time 12    Period Weeks    Status New    Target Date 03/07/21      PT LONG TERM GOAL #4   Title urinary leakage decreased >/= 75% due to improve pelvic floor coordination    Baseline --    Time 12    Period Weeks    Status New    Target Date 03/07/21      PT LONG TERM GOAL #5   Title pt will report ability to perform Knack in order to control urinary leakage when coughing or sneezing    Baseline --    Time 4    Period Months    Status New    Target Date 03/07/21                   Plan - 01/28/21 1358     Clinical Impression Statement Patient was sore in the abdominal area from last session when we did manual work to the abdomen so she does not want manual abdominal work. Patient has learned how to use a squatty potty and correct toileting technique today. Patient has learned some hip stretches. After the therapist use the addaday to the post. right hip she had increased right hip external rotation. Patient has not met goals due to just starting therapy. Patient will benefit from skilled therapy to improve pelvic floor coordination and reduce her constipation.    Personal Factors and Comorbidities Comorbidity 3+;Age;Past/Current Experience;Time since onset of injury/illness/exacerbation    Comorbidities rectosigmoid cancer with radiation and chemotherapy 2004; bilateral anterior hip replacement; spinal surgery    Examination-Activity Limitations Toileting;Continence;Locomotion Level    Stability/Clinical Decision Making Evolving/Moderate complexity    Rehab Potential Good    PT Frequency 1x / week    PT Duration Other (comment)   4 months   PT Treatment/Interventions ADLs/Self Care Home Management;Biofeedback;Neuromuscular re-education;Therapeutic  exercise;Therapeutic activities;Patient/family education;Manual techniques;Dry needling    PT Next Visit Plan diaphragmatic breathing, review toileting, work vaginally to get the pelvic floor muscles    PT Home Exercise Plan Access Code: [email or web address]    Consulted and Agree with Plan of Care Patient             Patient will benefit from skilled therapeutic intervention in order to improve the following deficits and impairments:  Decreased coordination, Decreased range of motion, Increased fascial restricitons, Decreased endurance, Increased muscle spasms, Decreased activity tolerance, Pain, Impaired flexibility, Decreased strength  Visit Diagnosis: Muscle weakness (generalized)  Cramp and spasm  Other lack of coordination  Constipation, unspecified constipation type     Problem List Patient Active Problem List   Diagnosis Date Noted   Prediabetes 03/06/2020   Abnormal echocardiogram 05/08/2019   Enlarged thoracic aorta ([place]) 04/17/2019   Elevated cholesterol 02/14/2018   Status post bilateral total hip replacement 05/27/2014   ANXIETY DISORDER- mixed disorder w/ depressive symptoms 02/07/2014   PERIPHERAL NEUROPATHY 10/13/2007   Essential hypertension 28/31/5176   HERNIA, UMBILICAL 16/01/3709   COLONIC POLYPS, ADENOMATOUS 02/20/2003   DIVERTICULOSIS, COLON 02/20/2003  ADENOCARCINOMA, COLON, SIGMOID, HX OF 02/20/2003    Earlie Counts, PT 01/28/21 2:07 PM  [facility] [address], [place], [place], [address] Phone: [phone]   Fax:  [phone]  Name: JAHARA DAIL MRN: [id number] Date of Birth: [birth year]

## 2021-03-04 ENCOUNTER — Other Ambulatory Visit: Payer: Self-pay

## 2021-03-04 ENCOUNTER — Encounter: Payer: Self-pay | Admitting: Physical Therapy

## 2021-03-04 ENCOUNTER — Ambulatory Visit: Payer: 59 | Attending: Gastroenterology | Admitting: Physical Therapy

## 2021-03-04 DIAGNOSIS — K5900 Constipation, unspecified: Secondary | ICD-10-CM | POA: Diagnosis present

## 2021-03-04 DIAGNOSIS — R278 Other lack of coordination: Secondary | ICD-10-CM | POA: Diagnosis present

## 2021-03-04 DIAGNOSIS — R252 Cramp and spasm: Secondary | ICD-10-CM

## 2021-03-04 DIAGNOSIS — M6281 Muscle weakness (generalized): Secondary | ICD-10-CM

## 2021-03-04 NOTE — Patient Instructions (Signed)
Access Code: [email or web address] URL: [email or web address] Date: 03/04/2021 Prepared by: Earlie Counts  Exercises Supine Figure 4 Piriformis Stretch - 1 x daily - 7 x weekly - 3 sets - 10 reps Supine Hamstring Stretch - 1 x daily - 7 x weekly - 3 sets - 10 reps Supine Lower Trunk Rotation - 1 x daily - 7 x weekly - 3 sets - 10 reps Hooklying Isometric Hip Flexion - 1 x daily - 7 x weekly - 1 sets - 5 reps - 5 sec hold Hooklying Isometric Hip Flexion with Opposite Arm - 1 x daily - 7 x weekly - 1 sets - 5 reps - 5 sec hold Supine Bridge with Mini Swiss Ball Between Knees - 1 x daily - 7 x weekly - 1 sets - 15 reps Seated Lateral Pelvic Tilt on Swiss Ball - 1 x daily - 7 x weekly - 3 sets - 10 reps Seated Swiss Ball Pelvic Circles - 1 x daily - 7 x weekly - 3 sets - 10 reps [facility] [address], [place], [place] [address] Phone # [phone] Fax [phone]

## 2021-03-04 NOTE — Therapy (Signed)
[facility] [address], [place], [place], [address] Phone: [phone]   Fax:  [phone]  Physical Therapy Treatment  Patient Details  Name: [name] [name] MRN: [id number] Date of Birth: 05/22/1956 Referring Provider (PT): Dr. Harl Bowie   Encounter Date: 03/04/2021   PT End of Session - 03/04/21 0843     Visit Number 4    Date for PT Re-Evaluation 05/27/21    Authorization Type UHC    PT Start Time 0800    PT Stop Time 0840    PT Time Calculation (min) 40 min    Activity Tolerance Patient tolerated treatment well    Behavior During Therapy [facility] for tasks assessed/performed             Past Medical History:  Diagnosis Date   Anemia    hx of, not at this time   Anxiety    no meds   Aortic regurgitation    Arthritis    "right hip; left hip; most of my joints" (05/27/2014)   Blood transfusion without reported diagnosis    Phreesia 09/04/2020   Cancer ([place])    Phreesia 09/04/2020   Depression    Diverticulosis    Essential hypertension, benign    takes Lisinopril-HCTZ daily   Heart murmur    High cholesterol    takes Pravastatin daily   History of blood transfusion    no abnormal reaction noted 2015   History of colon polyps    benign   Hyperlipidemia    Phreesia 09/04/2020   Hypertension    Phreesia 09/04/2020   Insomnia    doesn't take any meds   Joint pain    Neuromuscular disorder ([place])    Neuropathy    since chemo and radiation   Nocturia    PONV (postoperative nausea and vomiting)    hallucinations   Rectosigmoid cancer ([place]) 2004    S/P Surgery, Chemo, Rad Tx,  (after chemo treatment menstral periods have ceased).    Past Surgical History:  Procedure Laterality Date   ANAL RECTAL MANOMETRY N/A 07/17/2017   Procedure: ANO RECTAL MANOMETRY;  Surgeon: Mauri Pole, MD;  Location: [facility];  Service: Endoscopy;  Laterality: N/A;   COLECTOMY  Aug 2004   S/P Low  Anterior Resxn (Sigmoid Colon)   COLON SURGERY N/A    Phreesia 09/04/2020   COLONOSCOPY     DILATION AND CURETTAGE OF UTERUS  1991   JOINT REPLACEMENT N/A    Phreesia 09/04/2020   LUMBAR [place] SURGERY  Feb 2007   SPINE SURGERY N/A    Phreesia 09/04/2020   TOTAL HIP ARTHROPLASTY Right 05/27/2014   Procedure: RIGHT TOTAL HIP ARTHROPLASTY ANTERIOR APPROACH;  Surgeon: Hessie Dibble, MD;  Location: [facility];  Service: Orthopedics;  Laterality: Right;   TOTAL HIP ARTHROPLASTY Left 08/25/2015   Procedure: TOTAL HIP ARTHROPLASTY ANTERIOR APPROACH;  Surgeon: Melrose Nakayama, MD;  Location: [facility];  Service: Orthopedics;  Laterality: Left;    There were no vitals filed for this visit.   Subjective Assessment - 03/04/21 0802     Subjective I have not bee too bad. I am going to the bathroom 5 times. I am not having a full bowel movement, instead it is in pieces. The frequent bowel movements are during the morning rather than daytime. I have been doing the massages and helps. The bathroom technique help me. The massage on the right hip helped but did not last. My back bothers  me when I am constipated. I am using the Benefiber tablets and helps me to go. I am using them daily.    Patient Stated Goals be able to go to the bathroom    Currently in Pain? No/denies                [facility] PT Assessment - 03/04/21 0001       Assessment   Medical Diagnosis K59.02 Constipation, outlet dysfunction; K59.02 Dyssynergic defecation    Referring Provider (PT) Dr. Harl Bowie    Onset Date/Surgical Date --   15 years ago when she had cancer   Prior Therapy pelvic floor PT      Precautions   Precautions Other (comment)    Precaution Comments cancer      Restrictions   Weight Bearing Restrictions No      [place] residence      Prior Function   Level of Independence Independent    Vocation Requirements works at home, sitting    Leisure has stopped walking due  to feet and knees, and back hurt      Cognition   Overall Cognitive Status Within Functional Limits for tasks assessed      Strength   Right Hip External Rotation  4+/5    Right Hip ABduction 3+/5    Right Hip ADduction 5/5    Left Hip ABduction 3+/5    Left Hip ADduction 4/5                        Pelvic Floor Special Questions - 03/04/21 0001     Urinary Leakage Yes    Activities that cause leaking Laughing;Coughing    Urinary urgency Yes   for stool and urinary leakage   Urinary frequency BM often,    Fecal incontinence No   constipation   Strength fair squeeze, definite lift               [facility] Adult PT Treatment/Exercise - 03/04/21 0001       Exercises   Exercises Other Exercises    Other Exercises  sitting on physioball with hip circles, pelvic sway, pelvic tilt, and figure 8      Lumbar Exercises: Supine   Bridge with [employer] Squeeze 15 reps;1 second    Bridge with [employer] Limitations with pelvic floor contraction; VC to not bulge the abdomen and contract the pelvic floor    Isometric Hip Flexion 10 reps;5 seconds   right left   Isometric Hip Flexion Limitations then do on the inside of the knee to contract the obliques; needed verbal cues to not bulge the abdomen      Manual Therapy   Manual Therapy Soft tissue mobilization;Myofascial release    Soft tissue mobilization circular massage to the abdomen to promote peristalic motion of the intestines    Myofascial Release fascial release of the sac of Nathaneil Canary                    PT Education - 03/04/21 0841     Education Details Access Code: [email or web address]    Person(s) Educated Patient    Methods Explanation;Demonstration;Verbal cues;Handout    Comprehension Returned demonstration;Verbalized understanding              PT Short Term Goals - 03/04/21 [id number]       PT SHORT TERM GOAL #1   Title pt will be independent with toileting techniques  and diaphragmatic breathing technique for  improved intra-abdominal pressure during bowel movement    Time 4    Period Weeks    Status Achieved      PT SHORT TERM GOAL #2   Title ind with initial HEP    Time 4    Period Weeks    Status Achieved      PT SHORT TERM GOAL #3   Title able to correctly engage Transverse abdominus for improved functional activities and abdominal wall support    Time 4    Period Weeks    Status On-going               PT Long Term Goals - 11/05/20 1428       PT LONG TERM GOAL #1   Title pt will be ind with advanced HEP    Time 12    Period Weeks    Status New    Target Date 03/07/21      PT LONG TERM GOAL #2   Title pt will have bowel movement at least 1-3 x/day with full evacuation    Baseline --    Time 12    Period Weeks    Status New    Target Date 03/07/21      PT LONG TERM GOAL #3   Title pt will be able to verbalize understanding of at least 2 techniques to relieve symptoms of constipation including abdominal massage, pelvic movements, relaxation techniques    Time 12    Period Weeks    Status New    Target Date 03/07/21      PT LONG TERM GOAL #4   Title urinary leakage decreased >/= 75% due to improve pelvic floor coordination    Baseline --    Time 12    Period Weeks    Status New    Target Date 03/07/21      PT LONG TERM GOAL #5   Title pt will report ability to perform Knack in order to control urinary leakage when coughing or sneezing    Baseline --    Time 4    Period Months    Status New    Target Date 03/07/21                   Plan - 03/04/21 0813     Clinical Impression Statement Patient is having Type 4 and ends with Type 6. She is having 5 bowel movements per day during the morning. Patient is using the Benefiber 2 times per day and it helps.She is using the abdominal massage to promote peristalic motion of the intestines and it is helping. Pelvic floor strength is 3/5. Patient continues to have weakness in the hip abduction. She has  difficulty with contracting her abdominals without bulging her lower abdomen. When she coughs she will bulge her lower abdomen. Patient continues to leak urine with coughing and sneezing. Patient will benefit from skilled therapy to improve pelvic floor coordinaiton and reduce her constipation.    Personal Factors and Comorbidities Comorbidity 3+;Age;Past/Current Experience;Time since onset of injury/illness/exacerbation    Comorbidities rectosigmoid cancer with radiation and chemotherapy 2004; bilateral anterior hip replacement; spinal surgery    Examination-Activity Limitations Toileting;Continence;Locomotion Level    Stability/Clinical Decision Making Evolving/Moderate complexity    Rehab Potential Good    PT Frequency 1x / week    PT Duration Other (comment)   4 months   PT Treatment/Interventions ADLs/Self Care Home Management;Biofeedback;Neuromuscular re-education;Therapeutic exercise;Therapeutic activities;Patient/family education;Manual  techniques;Dry needling    PT Next Visit Plan diaphragmatic breathing, work on pelvic floor strength for urinary leakage;  gltueal medius strength    PT Home Exercise Plan Access Code: [email or web address]    Consulted and Agree with Plan of Care Patient             Patient will benefit from skilled therapeutic intervention in order to improve the following deficits and impairments:  Decreased coordination, Decreased range of motion, Increased fascial restricitons, Decreased endurance, Increased muscle spasms, Decreased activity tolerance, Pain, Impaired flexibility, Decreased strength  Visit Diagnosis: Muscle weakness (generalized)  Cramp and spasm  Other lack of coordination  Constipation, unspecified constipation type     Problem List Patient Active Problem List   Diagnosis Date Noted   Prediabetes 03/06/2020   Abnormal echocardiogram 05/08/2019   Enlarged thoracic aorta ([place]) 04/17/2019   Elevated cholesterol 02/14/2018   Status post bilateral  total hip replacement 05/27/2014   ANXIETY DISORDER- mixed disorder w/ depressive symptoms 02/07/2014   PERIPHERAL NEUROPATHY 10/13/2007   Essential hypertension [id number]   HERNIA, UMBILICAL [id number]   COLONIC POLYPS, ADENOMATOUS 02/20/2003   DIVERTICULOSIS, COLON 02/20/2003   ADENOCARCINOMA, COLON, SIGMOID, HX OF 02/20/2003    Earlie Counts, PT 03/04/21 11:02 AM  [facility] [address], [place], [place], [address] Phone: [phone]   Fax:  [phone]  Name: [name] [name] MRN: [id number] Date of Birth: Jun 28, 1956

## 2021-03-08 ENCOUNTER — Ambulatory Visit: Payer: 59 | Admitting: Emergency Medicine

## 2021-03-08 ENCOUNTER — Other Ambulatory Visit: Payer: Self-pay

## 2021-03-08 ENCOUNTER — Encounter: Payer: Self-pay | Admitting: Emergency Medicine

## 2021-03-08 VITALS — BP 124/80 | HR 88 | Temp 98.7°F | Ht 63.0 in | Wt 147.0 lb

## 2021-03-08 DIAGNOSIS — R7303 Prediabetes: Secondary | ICD-10-CM | POA: Diagnosis not present

## 2021-03-08 DIAGNOSIS — E785 Hyperlipidemia, unspecified: Secondary | ICD-10-CM

## 2021-03-08 DIAGNOSIS — K760 Fatty (change of) liver, not elsewhere classified: Secondary | ICD-10-CM | POA: Insufficient documentation

## 2021-03-08 DIAGNOSIS — I10 Essential (primary) hypertension: Secondary | ICD-10-CM

## 2021-03-08 DIAGNOSIS — I7789 Other specified disorders of arteries and arterioles: Secondary | ICD-10-CM

## 2021-03-08 DIAGNOSIS — I700 Atherosclerosis of aorta: Secondary | ICD-10-CM | POA: Insufficient documentation

## 2021-03-08 DIAGNOSIS — M4722 Other spondylosis with radiculopathy, cervical region: Secondary | ICD-10-CM

## 2021-03-08 HISTORY — DX: Fatty (change of) liver, not elsewhere classified: K76.0

## 2021-03-08 HISTORY — DX: Atherosclerosis of aorta: I70.0

## 2021-03-08 HISTORY — DX: Hyperlipidemia, unspecified: E78.5

## 2021-03-08 LAB — LIPID PANEL
Cholesterol: 143 mg/dL (ref 0–200)
HDL: 65.7 mg/dL (ref >39.00)
LDL Cholesterol: 60 mg/dL (ref 0–99)
NonHDL: 77.66
Total CHOL/HDL Ratio: 2
Triglycerides: 88 mg/dL (ref 0.0–149.0)
VLDL: 17.6 mg/dL (ref 0.0–40.0)

## 2021-03-08 LAB — COMPREHENSIVE METABOLIC PANEL
ALT: 18 U/L (ref 0–35)
AST: 18 U/L (ref 0–37)
Albumin: 4.3 g/dL (ref 3.5–5.2)
Alkaline Phosphatase: 48 U/L (ref 39–117)
BUN: 12 mg/dL (ref 6–23)
CO2: 26 mEq/L (ref 19–32)
Calcium: 9.4 mg/dL (ref 8.4–10.5)
Chloride: 96 mEq/L (ref 96–112)
Creatinine, Ser: 0.67 mg/dL (ref 0.40–1.20)
GFR: 92.28 mL/min (ref >60.00)
Glucose, Bld: 132 mg/dL — ABNORMAL HIGH (ref 70–99)
Potassium: 4.1 mEq/L (ref 3.5–5.1)
Sodium: 131 mEq/L — ABNORMAL LOW (ref 135–145)
Total Bilirubin: 0.8 mg/dL (ref 0.2–1.2)
Total Protein: 7.3 g/dL (ref 6.0–8.3)

## 2021-03-08 LAB — HEMOGLOBIN A1C: Hgb A1c MFr Bld: 6 % (ref 4.6–6.5)

## 2021-03-08 NOTE — Patient Instructions (Signed)
Prediabetes Eating Plan °Prediabetes is a condition that causes blood sugar (glucose) levels to be higher than normal. This increases the risk for developing type 2 diabetes (type 2 diabetes mellitus). Working with a health care provider or nutrition specialist (dietitian) to make diet and lifestyle changes can help prevent the onset of diabetes. These changes may help you: °Control your blood glucose levels. °Improve your cholesterol levels. °Manage your blood pressure. °What are tips for following this plan? °Reading food labels °Read food labels to check the amount of fat, salt (sodium), and sugar in prepackaged foods. Avoid foods that have: °Saturated fats. °Trans fats. °Added sugars. °Avoid foods that have more than 300 milligrams (mg) of sodium per serving. Limit your sodium intake to less than 2,300 mg each day. °Shopping °Avoid buying pre-made and processed foods. °Avoid buying drinks with added sugar. °Cooking °Cook with olive oil. Do not use butter, lard, or ghee. °Bake, broil, grill, steam, or boil foods. Avoid frying. °Meal planning ° °Work with your dietitian to create an eating plan that is right for you. This may include tracking how many calories you take in each day. Use a food diary, notebook, or mobile application to track what you eat at each meal. °Consider following a Mediterranean diet. This includes: °Eating several servings of fresh fruits and vegetables each day. °Eating fish at least twice a week. °Eating one serving each day of whole grains, beans, nuts, and seeds. °Using olive oil instead of other fats. °Limiting alcohol. °Limiting red meat. °Using nonfat or low-fat dairy products. °Consider following a plant-based diet. This includes dietary choices that focus on eating mostly vegetables and fruit, grains, beans, nuts, and seeds. °If you have high blood pressure, you may need to limit your sodium intake or follow a diet such as the DASH (Dietary Approaches to Stop Hypertension) eating  plan. The DASH diet aims to lower high blood pressure. °Lifestyle °Set weight loss goals with help from your health care team. It is recommended that most people with prediabetes lose 7% of their body weight. °Exercise for at least 30 minutes 5 or more days a week. °Attend a support group or seek support from a mental health counselor. °Take over-the-counter and prescription medicines only as told by your health care provider. °What foods are recommended? °Fruits °Berries. Bananas. Apples. Oranges. Grapes. Papaya. Mango. Pomegranate. Kiwi. Grapefruit. Cherries. °Vegetables °Lettuce. Spinach. Peas. Beets. Cauliflower. Cabbage. Broccoli. Carrots. Tomatoes. Squash. Eggplant. Herbs. Peppers. Onions. Cucumbers. Brussels sprouts. °Grains °Whole grains, such as whole-wheat or whole-grain breads, crackers, cereals, and pasta. Unsweetened oatmeal. Bulgur. Barley. Quinoa. Brown rice. Corn or whole-wheat flour tortillas or taco shells. °Meats and other proteins °Seafood. Poultry without skin. Lean cuts of pork and beef. Tofu. Eggs. Nuts. Beans. °Dairy °Low-fat or fat-free dairy products, such as yogurt, cottage cheese, and cheese. °Beverages °Water. Tea. Coffee. Sugar-free or diet soda. Seltzer water. Low-fat or nonfat milk. Milk alternatives, such as soy or almond milk. °Fats and oils °Olive oil. Canola oil. Sunflower oil. Grapeseed oil. Avocado. Walnuts. °Sweets and desserts °Sugar-free or low-fat pudding. Sugar-free or low-fat ice cream and other frozen treats. °Seasonings and condiments °Herbs. Sodium-free spices. Mustard. Relish. Low-salt, low-sugar ketchup. Low-salt, low-sugar barbecue sauce. Low-fat or fat-free mayonnaise. °The items listed above may not be a complete list of recommended foods and beverages. Contact a dietitian for more information. °What foods are not recommended? °Fruits °Fruits canned with syrup. °Vegetables °Canned vegetables. Frozen vegetables with butter or cream sauce. °Grains °Refined white  flour and flour   products, such as bread, pasta, snack foods, and cereals. °Meats and other proteins °Fatty cuts of meat. Poultry with skin. Breaded or fried meat. Processed meats. °Dairy °Full-fat yogurt, cheese, or milk. °Beverages °Sweetened drinks, such as iced tea and soda. °Fats and oils °Butter. Lard. Ghee. °Sweets and desserts °Baked goods, such as cake, cupcakes, pastries, cookies, and cheesecake. °Seasonings and condiments °Spice mixes with added salt. Ketchup. Barbecue sauce. Mayonnaise. °The items listed above may not be a complete list of foods and beverages that are not recommended. Contact a dietitian for more information. °Where to find more information °American Diabetes Association: www.diabetes.org °Summary °You may need to make diet and lifestyle changes to help prevent the onset of diabetes. These changes can help you control blood sugar, improve cholesterol levels, and manage blood pressure. °Set weight loss goals with help from your health care team. It is recommended that most people with prediabetes lose 7% of their body weight. °Consider following a Mediterranean diet. This includes eating plenty of fresh fruits and vegetables, whole grains, beans, nuts, seeds, fish, and low-fat dairy, and using olive oil instead of other fats. °This information is not intended to replace advice given to you by your health care provider. Make sure you discuss any questions you have with your health care provider. °Document Revised: 09/26/2019 Document Reviewed: 09/26/2019 °Elsevier Patient Education © 2022 [employer] ° °

## 2021-03-08 NOTE — Assessment & Plan Note (Signed)
Diet and nutrition discussed.  Continue simvastatin 40 mg daily. 

## 2021-03-08 NOTE — Assessment & Plan Note (Signed)
Stable.  Recent CT scan of chest shows no growth.

## 2021-03-08 NOTE — Assessment & Plan Note (Signed)
Well-controlled hypertension.  Continue Zestoretic 20-12.5 mg daily. BP Readings from Last 3 Encounters:  03/08/21 124/80  09/22/20 138/66  09/07/20 130/60

## 2021-03-08 NOTE — Assessment & Plan Note (Signed)
Diet and nutrition discussed.  Continue simvastatin 40 mg daily. The 10-year ASCVD risk score Mikey Bussing DC Brooke Bonito., et al., 2013) is: 9.6%   Values used to calculate the score:     Age: 65 years     Sex: Female     Is Non-Hispanic African American: No     Diabetic: Yes     Tobacco smoker: No     Systolic Blood Pressure: [id number] mmHg     Is BP treated: Yes     HDL Cholesterol: 68 mg/dL     Total Cholesterol: 164 mg/dL

## 2021-03-08 NOTE — Assessment & Plan Note (Signed)
Diet and nutrition discussed.  A1c drawn today. Advised to decrease amount of daily carbohydrate intake.

## 2021-03-08 NOTE — Assessment & Plan Note (Signed)
Incidental finding on most recent CT scan of the chest.  Early findings of cirrhosis also noted.  Needs follow-up with GI doctor.  Patient is established with a local GI doctor.  Will contact the office and schedule appointment.

## 2021-03-08 NOTE — Progress Notes (Signed)
[name] [name] 65 y.o.   Chief Complaint  Patient presents with   Diabetes    Pre diabetic follow up    HISTORY OF PRESENT ILLNESS: This is a 65 y.o. female here for prediabetes follow-up Has history of prediabetes, borderline diabetes.  On no medications. Has history of chronic headaches for which she takes gabapentin. History of hypertension on Zestoretic 20-12.5 mg daily. History of dyslipidemia on simvastatin 40 mg daily. History of arthritis, status post bilateral hip replacements. History of thoracic aorta aneurysm, under surveillance. Has chronic pain from arthritis including neck pain. History of colon cancer.  Followed by chemotherapy and eventual neuropathy. Recent CT scan of chest revealed incidental finding of follows: IMPRESSION: 1. No significant change in the previously demonstrated ascending thoracic aortic aneurysm, currently measuring 4.5 cm in maximum diameter. Recommend semi-annual imaging followup by CTA or MRA and referral to cardiothoracic surgery if not already obtained. This recommendation follows 2010 ACCF/AHA/AATS/ACR/ASA/SCA/SCAI/SIR/STS/SVM Guidelines for the Diagnosis and Management of Patients With Thoracic Aortic Disease. Circulation. 2010; [id number]. Aortic aneurysm NOS (ICD10-I71.9) 2. Stable calcific coronary artery and aortic atherosclerosis. 3. Stable diffuse hepatic steatosis and possible early changes of cirrhosis.   Aortic Atherosclerosis (ICD10-I70.0).     Electronically Signed   By: Claudie Revering M.D.   On: 01/21/2021 14:01 Lab Results  Component Value Date   HGBA1C 6.1 (H) 09/07/2020   Wt Readings from Last 3 Encounters:  03/08/21 147 lb (66.7 kg)  09/22/20 151 lb (68.5 kg)  09/07/20 150 lb 6.4 oz (68.2 kg)   BP Readings from Last 3 Encounters:  03/08/21 124/80  09/22/20 138/66  09/07/20 130/60     Diabetes Pertinent negatives for hypoglycemia include no dizziness or headaches. Pertinent negatives for diabetes  include no chest pain.    Prior to Admission medications   Medication Sig Start Date End Date Taking? Authorizing Provider  Artificial Tear Ointment (DRY EYES OP) Apply to eye as needed.   Yes [provider]  gabapentin (NEURONTIN) 400 MG capsule Take 400 mg by mouth daily. 08/28/20  Yes [provider]  LINZESS 72 MCG capsule TAKE 1 CAPSULE BY MOUTH DAILY BEFORE BREAKFAST. 11/30/20  Yes Nandigam, Venia Minks, MD  lisinopril-hydrochlorothiazide (ZESTORETIC) 20-12.5 MG tablet TAKE 1 TABLET BY MOUTH EVERY DAY 04/06/20  Yes Richardo Priest, MD  meloxicam (MOBIC) 15 MG tablet Take 1 tablet (15 mg total) by mouth daily. 01/21/21  Yes Garin Mata, Ines Bloomer, MD  Multiple Vitamins-Minerals (MULTIVITAMIN ADULT PO) Take by mouth daily.   Yes [provider]  simvastatin (ZOCOR) 40 MG tablet TAKE 1 TABLET BY MOUTH EVERYDAY AT BEDTIME / NEEDS APPOINTMENT FOR FURTHER REFILL 01/14/21  Yes Richardo Priest, MD    No Known Allergies  Patient Active Problem List   Diagnosis Date Noted   Prediabetes 03/06/2020   Abnormal echocardiogram 05/08/2019   Enlarged thoracic aorta ([place]) 04/17/2019   Elevated cholesterol 02/14/2018   Status post bilateral total hip replacement 05/27/2014   ANXIETY DISORDER- mixed disorder w/ depressive symptoms 02/07/2014   PERIPHERAL NEUROPATHY 10/13/2007   Essential hypertension 123XX123   HERNIA, UMBILICAL 123XX123   COLONIC POLYPS, ADENOMATOUS 02/20/2003   DIVERTICULOSIS, COLON 02/20/2003   ADENOCARCINOMA, COLON, SIGMOID, HX OF 02/20/2003    Past Medical History:  Diagnosis Date   Anemia    hx of, not at this time   Anxiety    no meds   Aortic regurgitation    Arthritis    "right hip; left hip; most of my  joints" (05/27/2014)   Blood transfusion without reported diagnosis    Phreesia 09/04/2020   Cancer ([place])    Phreesia 09/04/2020   Depression    Diverticulosis    Essential hypertension, benign    takes Lisinopril-HCTZ daily   Heart  murmur    High cholesterol    takes Pravastatin daily   History of blood transfusion    no abnormal reaction noted 2015   History of colon polyps    benign   Hyperlipidemia    Phreesia 09/04/2020   Hypertension    Phreesia 09/04/2020   Insomnia    doesn't take any meds   Joint pain    Neuromuscular disorder ([place])    Neuropathy    since chemo and radiation   Nocturia    PONV (postoperative nausea and vomiting)    hallucinations   Rectosigmoid cancer ([place]) 2004    S/P Surgery, Chemo, Rad Tx,  (after chemo treatment menstral periods have ceased).    Past Surgical History:  Procedure Laterality Date   ANAL RECTAL MANOMETRY N/A 07/17/2017   Procedure: ANO RECTAL MANOMETRY;  Surgeon: Mauri Pole, MD;  Location: [facility];  Service: Endoscopy;  Laterality: N/A;   COLECTOMY  Aug 2004   S/P Low Anterior Resxn (Sigmoid Colon)   COLON SURGERY N/A    Phreesia 09/04/2020   COLONOSCOPY     DILATION AND CURETTAGE OF UTERUS  1991   JOINT REPLACEMENT N/A    Phreesia 09/04/2020   LUMBAR [place] SURGERY  Feb 2007   SPINE SURGERY N/A    Phreesia 09/04/2020   TOTAL HIP ARTHROPLASTY Right 05/27/2014   Procedure: RIGHT TOTAL HIP ARTHROPLASTY ANTERIOR APPROACH;  Surgeon: Hessie Dibble, MD;  Location: [facility];  Service: Orthopedics;  Laterality: Right;   TOTAL HIP ARTHROPLASTY Left 08/25/2015   Procedure: TOTAL HIP ARTHROPLASTY ANTERIOR APPROACH;  Surgeon: Melrose Nakayama, MD;  Location: [place] [facility];  Service: Orthopedics;  Laterality: Left;    Social History   Socioeconomic History   Marital status: Married    Spouse name: Nicole Kindred   Number of children: 1   Years of education: Not on file   Highest education level: Not on file  Occupational History   Occupation: office work  Tobacco Use   Smoking status: Former    Types: Cigarettes   Smokeless tobacco: Never   Tobacco comments:    "smoked some socially in my college days"  Vaping Use   Vaping Use: Never used  Substance and Sexual  Activity   Alcohol use: Yes    Comment: occasionally beer/wine   Drug use: No   Sexual activity: Yes    Partners: Male    Birth control/protection: None  Other Topics Concern   Not on file  Social History Narrative   Lives with husband   Exercise walking 2 x weekly   Patient's family history -  UNKNOWN ADOPTED   Social Determinants of Health   Financial Resource Strain: Not on file  Food Insecurity: Not on file  Transportation Needs: Not on file  Physical Activity: Not on file  Stress: Not on file  Social Connections: Not on file  Intimate Partner Violence: Not on file    Family History  Adopted: Yes     Review of Systems  Constitutional: Negative.  Negative for chills and fever.  HENT: Negative.  Negative for congestion and sore throat.   Respiratory: Negative.  Negative for cough and shortness of breath.   Cardiovascular: Negative.  Negative for chest  pain and palpitations.  Gastrointestinal:  Negative for abdominal pain, blood in stool, diarrhea, melena, nausea and vomiting.  Genitourinary: Negative.  Negative for dysuria and hematuria.  Skin: Negative.  Negative for rash.  Neurological:  Negative for dizziness and headaches.  All other systems reviewed and are negative.  Today's Vitals   03/08/21 0953  BP: 124/80  Pulse: 88  SpO2: 98%  Weight: 147 lb (66.7 kg)  Height: '5\' 3"'$  (1.6 m)   Body mass index is 26.04 kg/m.  Physical Exam Vitals reviewed.  Constitutional:      Appearance: Normal appearance.  HENT:     Head: Normocephalic.  Eyes:     Extraocular Movements: Extraocular movements intact.     Conjunctiva/sclera: Conjunctivae normal.     Pupils: Pupils are equal, round, and reactive to light.  Cardiovascular:     Rate and Rhythm: Normal rate and regular rhythm.     Pulses: Normal pulses.     Heart sounds: Murmur heard.  Pulmonary:     Effort: Pulmonary effort is normal.     Breath sounds: Normal breath sounds.  Musculoskeletal:         General: Normal range of motion.     Cervical back: Normal range of motion and neck supple.  Skin:    General: Skin is warm and dry.     Capillary Refill: Capillary refill takes less than 2 seconds.  Neurological:     General: No focal deficit present.     Mental Status: She is alert and oriented to person, place, and time.  Psychiatric:        Mood and Affect: Mood normal.        Behavior: Behavior normal.     ASSESSMENT & PLAN: Lakeisha was seen today for diabetes.  Diagnoses and all orders for this visit:  Prediabetes -     Hemoglobin A1c  Essential hypertension -     Comprehensive metabolic panel  Dyslipidemia -     Lipid panel  Enlarged thoracic aorta (HCC)  Osteoarthritis of spine with radiculopathy, cervical region  Steatosis of liver -     Comprehensive metabolic panel  Atherosclerosis of aorta (HCC)  Essential hypertension Well-controlled hypertension.  Continue Zestoretic 20-12.5 mg daily. BP Readings from Last 3 Encounters:  03/08/21 124/80  09/22/20 138/66  09/07/20 130/60     Prediabetes Diet and nutrition discussed.  A1c drawn today. Advised to decrease amount of daily carbohydrate intake.  Dyslipidemia Diet and nutrition discussed.  Continue simvastatin 40 mg daily. The 10-year ASCVD risk score Mikey Bussing DC Brooke Bonito., et al., 2013) is: 9.6%   Values used to calculate the score:     Age: 78 years     Sex: Female     Is Non-Hispanic African American: No     Diabetic: Yes     Tobacco smoker: No     Systolic Blood Pressure: [id number] mmHg     Is BP treated: Yes     HDL Cholesterol: 68 mg/dL     Total Cholesterol: 164 mg/dL   Enlarged thoracic aorta (HCC) Stable.  Recent CT scan of chest shows no growth.  Atherosclerosis of aorta (HCC) Diet and nutrition discussed.  Continue simvastatin 40 mg daily.  Steatosis of liver Incidental finding on most recent CT scan of the chest.  Early findings of cirrhosis also noted.  Needs follow-up with GI doctor.   Patient is established with a local GI doctor.  Will contact the office and schedule appointment.  Patient Instructions  Prediabetes Eating Plan Prediabetes is a condition that causes blood sugar (glucose) levels to be higher than normal. This increases the risk for developing type 2 diabetes (type 2 diabetes mellitus). Working with a health care provider or nutrition specialist (dietitian) to make diet and lifestyle changes can help prevent the onset of diabetes. These changes may help you: Control your blood glucose levels. Improve your cholesterol levels. Manage your blood pressure. What are tips for following this plan? Reading food labels Read food labels to check the amount of fat, salt (sodium), and sugar in prepackaged foods. Avoid foods that have: Saturated fats. Trans fats. Added sugars. Avoid foods that have more than 300 milligrams (mg) of sodium per serving. Limit your sodium intake to less than 2,300 mg each day. Shopping Avoid buying pre-made and processed foods. Avoid buying drinks with added sugar. Cooking Cook with olive oil. Do not use butter, lard, or ghee. Bake, broil, grill, steam, or boil foods. Avoid frying. Meal planning  Work with your dietitian to create an eating plan that is right for you. This may include tracking how many calories you take in each day. Use a food diary, notebook, or mobile application to track what you eat at each meal. Consider following a Mediterranean diet. This includes: Eating several servings of fresh fruits and vegetables each day. Eating fish at least twice a week. Eating one serving each day of whole grains, beans, nuts, and seeds. Using olive oil instead of other fats. Limiting alcohol. Limiting red meat. Using nonfat or low-fat dairy products. Consider following a plant-based diet. This includes dietary choices that focus on eating mostly vegetables and fruit, grains, beans, nuts, and seeds. If you have high blood pressure,  you may need to limit your sodium intake or follow a diet such as the DASH (Dietary Approaches to Stop Hypertension) eating plan. The DASH diet aims to lower high blood pressure.  Lifestyle Set weight loss goals with help from your health care team. It is recommended that most people with prediabetes lose 7% of their body weight. Exercise for at least 30 minutes 5 or more days a week. Attend a support group or seek support from a mental health counselor. Take over-the-counter and prescription medicines only as told by your health care provider. What foods are recommended? Fruits Berries. Bananas. Apples. Oranges. Grapes. Papaya. Mango. Pomegranate. Kiwi.Grapefruit. Cherries. Vegetables Lettuce. Spinach. Peas. Beets. Cauliflower. Cabbage. Broccoli. Carrots.Tomatoes. Squash. Eggplant. Herbs. Peppers. Onions. Cucumbers. Brussels sprouts. Grains Whole grains, such as whole-wheat or whole-grain breads, crackers, cereals, and pasta. Unsweetened oatmeal. Bulgur. Barley. Quinoa. Brown rice. Corn orwhole-wheat flour tortillas or taco shells. Meats and other proteins Seafood. Poultry without skin. Lean cuts of pork and beef. Tofu. Eggs. Nuts.Beans. Dairy Low-fat or fat-free dairy products, such as yogurt, cottage cheese, and cheese. Beverages Water. Tea. Coffee. Sugar-free or diet soda. Seltzer water. Low-fat or nonfatmilk. Milk alternatives, such as soy or almond milk. Fats and oils Olive oil. Canola oil. Sunflower oil. Grapeseed oil. Avocado. Walnuts. Sweets and desserts Sugar-free or low-fat pudding. Sugar-free or low-fat ice cream and other frozentreats. Seasonings and condiments Herbs. Sodium-free spices. Mustard. Relish. Low-salt, low-sugar ketchup.Low-salt, low-sugar barbecue sauce. Low-fat or fat-free mayonnaise. The items listed above may not be a complete list of recommended foods and beverages. Contact a dietitian for more information. What foods are not recommended? Fruits Fruits  canned with syrup. Vegetables Canned vegetables. Frozen vegetables with butter or cream sauce. Grains Refined white flour and flour products, such as bread, pasta, snack  foods, andcereals. Meats and other proteins Fatty cuts of meat. Poultry with skin. Breaded or fried meat. Processed meats. Dairy Full-fat yogurt, cheese, or milk. Beverages Sweetened drinks, such as iced tea and soda. Fats and oils Butter. Lard. Ghee. Sweets and desserts Baked goods, such as cake, cupcakes, pastries, cookies, and cheesecake. Seasonings and condiments Spice mixes with added salt. Ketchup. Barbecue sauce. Mayonnaise. The items listed above may not be a complete list of foods and beverages that are not recommended. Contact a dietitian for more information. Where to find more information American Diabetes Association: www.diabetes.org Summary You may need to make diet and lifestyle changes to help prevent the onset of diabetes. These changes can help you control blood sugar, improve cholesterol levels, and manage blood pressure. Set weight loss goals with help from your health care team. It is recommended that most people with prediabetes lose 7% of their body weight. Consider following a Mediterranean diet. This includes eating plenty of fresh fruits and vegetables, whole grains, beans, nuts, seeds, fish, and low-fat dairy, and using olive oil instead of other fats. This information is not intended to replace advice given to you by your health care provider. Make sure you discuss any questions you have with your healthcare provider. Document Revised: 09/26/2019 Document Reviewed: 09/26/2019 Elsevier Patient Education  2022 [place], MD [facility]

## 2021-03-19 ENCOUNTER — Encounter: Payer: 59 | Admitting: Physical Therapy

## 2021-03-30 ENCOUNTER — Encounter: Payer: Self-pay | Admitting: Emergency Medicine

## 2021-04-02 ENCOUNTER — Encounter: Payer: 59 | Admitting: Physical Therapy

## 2021-04-16 ENCOUNTER — Encounter: Payer: 59 | Admitting: Physical Therapy

## 2021-04-17 ENCOUNTER — Other Ambulatory Visit: Payer: Self-pay | Admitting: Emergency Medicine

## 2021-04-17 DIAGNOSIS — M4722 Other spondylosis with radiculopathy, cervical region: Secondary | ICD-10-CM

## 2021-04-20 ENCOUNTER — Other Ambulatory Visit: Payer: Self-pay | Admitting: Gastroenterology

## 2021-04-22 ENCOUNTER — Other Ambulatory Visit: Payer: Self-pay | Admitting: Cardiology

## 2021-04-30 ENCOUNTER — Encounter: Payer: 59 | Admitting: Physical Therapy

## 2021-05-06 ENCOUNTER — Telehealth: Payer: Self-pay | Admitting: Genetic Counselor

## 2021-05-06 ENCOUNTER — Encounter: Payer: Self-pay | Admitting: Gastroenterology

## 2021-05-06 ENCOUNTER — Ambulatory Visit (INDEPENDENT_AMBULATORY_CARE_PROVIDER_SITE_OTHER): Payer: 59 | Admitting: Gastroenterology

## 2021-05-06 VITALS — BP 128/70 | HR 100 | Ht 63.0 in | Wt 151.0 lb

## 2021-05-06 DIAGNOSIS — Z85038 Personal history of other malignant neoplasm of large intestine: Secondary | ICD-10-CM

## 2021-05-06 DIAGNOSIS — K760 Fatty (change of) liver, not elsewhere classified: Secondary | ICD-10-CM

## 2021-05-06 DIAGNOSIS — K5902 Outlet dysfunction constipation: Secondary | ICD-10-CM

## 2021-05-06 DIAGNOSIS — Z23 Encounter for immunization: Secondary | ICD-10-CM | POA: Diagnosis not present

## 2021-05-06 NOTE — Patient Instructions (Signed)
You have been scheduled for an abdominal ultrasound/ Elastography at [facility] (1st floor of hospital) on  05/12/2021  at 9:00 am. Please arrive 15 minutes prior to your appointment for registration. Make certain not to have anything to eat or drink after midnight  prior to your appointment. Should you need to reschedule your appointment, please contact radiology at [phone]. This test typically takes about 30 minutes to perform.   Your recall colonoscopy is due in 04/2022, we will send you a reminder letter  Take Benefiber 2 tablets three times a day with meals  We will refer you for Genetic testing and they will contact you with that appointment  We have given you your FLU shot today  Follow up in 6 months  Due to recent changes in healthcare laws, you may see the results of your imaging and laboratory studies on MyChart before your provider has had a chance to review them.  We understand that in some cases there may be results that are confusing or concerning to you. Not all laboratory results come back in the same time frame and the provider may be waiting for multiple results in order to interpret others.  Please give [name] 48 hours in order for your provider to thoroughly review all the results before contacting the office for clarification of your results.    I appreciate the  opportunity to care for you  Thank You   Harl Bowie , MD

## 2021-05-06 NOTE — Progress Notes (Signed)
[name] [name]    [id number]    1956-04-17  Primary Care Physician:Sagardia, Ines Bloomer, MD  Referring Physician: Horald Pollen, MD [place],   [id number]   Chief complaint: Fatty Liver, constipation  HPI:  65 year old female with history of colon cancer status post resection here for follow-up visit for fatty liver, constipation and complaints of incomplete evacuation.  She continues to have persistent symptoms of incomplete evacuation and fragmentation of stool despite taking Linzess.  On average she has 3-4 bowel movements in a day before she feels she completely evacuated. She did not feel pelvic floor physical therapy helped much, the sessions were spaced out so she felt there was not much continuity  She recently had CT scan which showed features of fatty liver.   Denies any blood per rectum, melena, vomiting, decreased appetite or weight loss.   Anorectal manometry July 17, 2017 showed weak anal sphincter and dyssynergic defecation She subsequently did pelvic floor physical therapy with no significant improvement of symptoms   Colonoscopy March 29, 2018: 3 polyps (tubular adenoma and sessile serrated polyp) 5 to 11 mm in size removed from descending and transverse colon with cold snare.  2 polyps (tubular adenoma ) less than 5 mm in size removed with forceps.  Left-sided diverticulosis.  Functional colocolonic anastomosis.   Colonoscopy April 25, 2019: - Mild diverticulosis in the descending colon. - Non-bleeding internal hemorrhoids. - Patent functional end-to-end colo-colonic anastomosis, characterized by healthy appearing mucosa. - No specimens   Outpatient Encounter Medications as of 05/06/2021  Medication Sig   Artificial Tear Ointment (DRY EYES OP) Apply to eye as needed.   gabapentin (NEURONTIN) 400 MG capsule Take 400 mg by mouth daily.   LINZESS 72 MCG capsule TAKE 1 CAPSULE BY MOUTH EVERY DAY BEFORE BREAKFAST    lisinopril-hydrochlorothiazide (ZESTORETIC) 20-12.5 MG tablet TAKE 1 TABLET BY MOUTH EVERY DAY   meloxicam (MOBIC) 15 MG tablet TAKE 1 TABLET (15 MG TOTAL) BY MOUTH DAILY.   Multiple Vitamins-Minerals (MULTIVITAMIN ADULT PO) Take by mouth daily.   simvastatin (ZOCOR) 40 MG tablet TAKE 1 TABLET BY MOUTH EVERYDAY AT BEDTIME / NEEDS APPOINTMENT FOR FURTHER REFILL   No facility-administered encounter medications on file as of 05/06/2021.    Allergies as of 05/06/2021   (No Known Allergies)    Past Medical History:  Diagnosis Date   Anemia    hx of, not at this time   Anxiety    no meds   Aortic regurgitation    Arthritis    "right hip; left hip; most of my joints" (05/27/2014)   Blood transfusion without reported diagnosis    Phreesia 09/04/2020   Cancer ([place])    Phreesia 09/04/2020   Depression    Diverticulosis    Essential hypertension, benign    takes Lisinopril-HCTZ daily   Heart murmur    High cholesterol    takes Pravastatin daily   History of blood transfusion    no abnormal reaction noted 2015   History of colon polyps    benign   Hyperlipidemia    Phreesia 09/04/2020   Hypertension    Phreesia 09/04/2020   Insomnia    doesn't take any meds   Joint pain    Neuromuscular disorder ([place])    Neuropathy    since chemo and radiation   Nocturia    PONV (postoperative nausea and vomiting)    hallucinations   Rectosigmoid cancer ([place]) 2004  S/P Surgery, Chemo, Rad Tx,  (after chemo treatment menstral periods have ceased).    Past Surgical History:  Procedure Laterality Date   ANAL RECTAL MANOMETRY N/A 07/17/2017   Procedure: ANO RECTAL MANOMETRY;  Surgeon: Mauri Pole, MD;  [facility];  Service: Endoscopy;  Laterality: N/A;   COLECTOMY  Aug 2004   S/P Low Anterior Resxn (Sigmoid Colon)   COLON SURGERY N/A    Phreesia 09/04/2020   COLONOSCOPY     DILATION AND CURETTAGE OF UTERUS  1991   JOINT REPLACEMENT N/A    Phreesia 09/04/2020    LUMBAR [place] SURGERY  Feb 2007   SPINE SURGERY N/A    Phreesia 09/04/2020   TOTAL HIP ARTHROPLASTY Right 05/27/2014   Procedure: RIGHT TOTAL HIP ARTHROPLASTY ANTERIOR APPROACH;  Surgeon: Hessie Dibble, MD;  Location: [place];  Service: Orthopedics;  Laterality: Right;   TOTAL HIP ARTHROPLASTY Left 08/25/2015   Procedure: TOTAL HIP ARTHROPLASTY ANTERIOR APPROACH;  Surgeon: Melrose Nakayama, MD;  Location: [place];  Service: Orthopedics;  Laterality: Left;    Family History  Adopted: Yes    Social History   Socioeconomic History   Marital status: Married    Spouse name: Nicole Kindred   Number of children: 1   Years of education: Not on file   Highest education level: Not on file  Occupational History   Occupation: office work  Tobacco Use   Smoking status: Former    Types: Cigarettes   Smokeless tobacco: Never   Tobacco comments:    "smoked some socially in my college days"  Vaping Use   Vaping Use: Never used  Substance and Sexual Activity   Alcohol use: Yes    Comment: occasionally beer/wine   Drug use: No   Sexual activity: Yes    Partners: Male    Birth control/protection: None  Other Topics Concern   Not on file  Social History Narrative   Lives with husband   Exercise walking 2 x weekly   Patient's family history -  UNKNOWN ADOPTED   Social Determinants of Health   Financial Resource Strain: Not on file  Food Insecurity: Not on file  Transportation Needs: Not on file  Physical Activity: Not on file  Stress: Not on file  Social Connections: Not on file  Intimate Partner Violence: Not on file      Review of systems: All other review of systems negative except as mentioned in the HPI.   Physical Exam: Vitals:   05/06/21 0845  BP: 128/70  Pulse: 100  SpO2: 99%   Body mass index is 26.75 kg/m. Gen:      No acute distress HEENT:  sclera anicteric Abd:      soft, non-tender; no palpable masses, no distension Ext:    No edema Neuro: alert and oriented x  3 Psych: normal mood and affect  Data Reviewed:  Reviewed labs, radiology imaging, old records and pertinent past GI work up   Assessment and Plan/Recommendations:    65 year old very pleasant female with history of colon cancer s/p resection with chronic constipation secondary to outlet dysfunction and dyssynergic defecation   Advised patient to increase Benefiber to 2 tablets 3 times daily with meals Increase water intake to 8 to 10 cups daily   She is up-to-date with colorectal cancer surveillance, she has personal history of colon cancer and multiple adenomatous colon polyps.  Will refer to genetic counseling and testing to exclude Lynch syndrome or attenuated familial polyposis syndrome to accurately  determine the interval for surveillance colonoscopy  Fatty liver on CT, will obtain abdominal ultrasound with elastography to assess degree of fibrosis Discussed dietary changes and exercise  She has not received her flu vaccination yet, will plan to give it today in office  Return in 6 months or sooner if needed  This visit required 40 minutes of patient care (this includes precharting, chart review, review of results, face-to-face time used for counseling as well as treatment plan and follow-up. The patient was provided an opportunity to ask questions and all were answered. The patient agreed with the plan and demonstrated an understanding of the instructions.  Damaris Hippo , MD    CC: [place], [place]

## 2021-05-06 NOTE — Telephone Encounter (Signed)
Scheduled appt per 10/27 referral. Pt is aware of appt date and time.  

## 2021-05-12 ENCOUNTER — Ambulatory Visit (HOSPITAL_COMMUNITY)
Admission: RE | Admit: 2021-05-12 | Discharge: 2021-05-12 | Disposition: A | Discharge status: Home / Self Care | Payer: 59 | Source: Ambulatory Visit | Attending: Gastroenterology | Admitting: Gastroenterology

## 2021-05-12 ENCOUNTER — Other Ambulatory Visit: Payer: Self-pay

## 2021-05-12 DIAGNOSIS — K760 Fatty (change of) liver, not elsewhere classified: Secondary | ICD-10-CM | POA: Insufficient documentation

## 2021-05-13 ENCOUNTER — Encounter: Payer: Self-pay | Admitting: Gastroenterology

## 2021-05-14 ENCOUNTER — Ambulatory Visit: Payer: 59 | Admitting: Physical Therapy

## 2021-05-18 ENCOUNTER — Telehealth: Payer: Self-pay

## 2021-05-18 ENCOUNTER — Encounter: Payer: Self-pay | Admitting: Gastroenterology

## 2021-05-18 NOTE — Telephone Encounter (Signed)
-----   Message from Mauri Pole, MD sent at 05/18/2021  3:20 PM EST ----- Ultrasound did not show signs of significant fibrosis or cirrhosis.  Continue with dietary changes and exercise as discussed during office visit to improve fatty liver.  Please call to schedule follow-up office visit in 3 to 4 months.

## 2021-05-18 NOTE — Telephone Encounter (Signed)
Left message for patient to please call back. 

## 2021-05-20 ENCOUNTER — Inpatient Hospital Stay: Payer: 59 | Attending: Genetic Counselor | Admitting: Genetic Counselor

## 2021-05-20 ENCOUNTER — Other Ambulatory Visit: Payer: Self-pay

## 2021-05-20 ENCOUNTER — Inpatient Hospital Stay: Payer: 59

## 2021-05-20 DIAGNOSIS — Z85038 Personal history of other malignant neoplasm of large intestine: Secondary | ICD-10-CM | POA: Diagnosis not present

## 2021-05-20 DIAGNOSIS — Z86010 Personal history of colonic polyps: Secondary | ICD-10-CM | POA: Diagnosis not present

## 2021-05-21 ENCOUNTER — Encounter: Payer: Self-pay | Admitting: Genetic Counselor

## 2021-05-21 DIAGNOSIS — Z860100 Personal history of colon polyps, unspecified: Secondary | ICD-10-CM

## 2021-05-21 HISTORY — DX: Personal history of colonic polyps: Z86.010

## 2021-05-21 HISTORY — DX: Personal history of colon polyps, unspecified: Z86.0100

## 2021-05-21 NOTE — Progress Notes (Signed)
REFERRING PROVIDER: Mauri Pole, MD [place],  [place] [phone]  PRIMARY PROVIDER:  Horald Pollen, MD  PRIMARY REASON FOR VISIT:  1. Personal history of colon cancer   2. History of colonic polyps    HISTORY OF PRESENT ILLNESS:   [name] [name], a 65 y.o. female, was seen for a [place] cancer genetics consultation at the request of Dr. Silverio Decamp due to a personal history of cancer and colon polyps.  [name] presents to clinic today to discuss the possibility of a hereditary predisposition to cancer, to discuss genetic testing, and to further clarify her future cancer risks, as well as potential cancer risks for family members.   In 2004, at the age of 47, [name] was diagnosed with adenocarcinoma of the colon.  The treatment plan included surgery, chemotherapy, and radiation.  No information on mismatch repair IHC status and MSI status was available. [name] also has a history of more than 10 tubular adenomas of the colon.   RISK FACTORS:  Menarche at unknown age.  First live birth at age 68.  OCP use for more than 10 years.  Ovaries intact: yes.  Hysterectomy: no.  Menopausal status: postmenopausal.  HRT use: 0 years. Colonoscopy: yes;  most recent in 2020 . Mammogram within the last year: yes. Number of breast biopsies: 0. Up to date with pelvic exams: no. Dermatology as needed.   Past Medical History:  Diagnosis Date   Anemia    hx of, not at this time   Anxiety    no meds   Aortic regurgitation    Arthritis    "right hip; left hip; most of my joints" (05/27/2014)   Blood transfusion without reported diagnosis    Phreesia 09/04/2020   Cancer ([place])    [facility]reesia 09/04/2020   Depression    Diverticulosis    Essential hypertension, benign    takes Lisinopril-HCTZ daily   Heart murmur    High cholesterol    takes Pravastatin daily   History of blood transfusion    no abnormal reaction noted 2015   History of colon polyps     benign   History of colonic polyps 05/21/2021   Hyperlipidemia    Phreesia 09/04/2020   Hypertension    Phreesia 09/04/2020   Insomnia    doesn't take any meds   Joint pain    Neuromuscular disorder ([place])    Neuropathy    since chemo and radiation   Nocturia    PONV (postoperative nausea and vomiting)    hallucinations   Rectosigmoid cancer ([place]) 2004    S/P Surgery, Chemo, Rad Tx,  (after chemo treatment menstral periods have ceased).    Past Surgical History:  Procedure Laterality Date   ANAL RECTAL MANOMETRY N/A 07/17/2017   Procedure: ANO RECTAL MANOMETRY;  Surgeon: Mauri Pole, MD;  Location: [facility];  Service: Endoscopy;  Laterality: N/A;   COLECTOMY  Aug 2004   S/P Low Anterior Resxn (Sigmoid Colon)   COLON SURGERY N/A    Phreesia 09/04/2020   COLONOSCOPY     DILATION AND CURETTAGE OF UTERUS  1991   JOINT REPLACEMENT N/A    Phreesia 09/04/2020   LUMBAR [place] SURGERY  Feb 2007   SPINE SURGERY N/A    Phreesia 09/04/2020   TOTAL HIP ARTHROPLASTY Right 05/27/2014   Procedure: RIGHT TOTAL HIP ARTHROPLASTY ANTERIOR APPROACH;  Surgeon: Hessie Dibble, MD;  Location: [facility];  Service: Orthopedics;  Laterality: Right;  TOTAL HIP ARTHROPLASTY Left 08/25/2015   Procedure: TOTAL HIP ARTHROPLASTY ANTERIOR APPROACH;  Surgeon: Melrose Nakayama, MD;  Location: [facility];  Service: Orthopedics;  Laterality: Left;    Social History   Socioeconomic History   Marital status: Married    Spouse name: Nicole Kindred   Number of children: 1   Years of education: Not on file   Highest education level: Not on file  Occupational History   Occupation: office work  Tobacco Use   Smoking status: Former    Types: Cigarettes   Smokeless tobacco: Never   Tobacco comments:    "smoked some socially in my college days"  Vaping Use   Vaping Use: Never used  Substance and Sexual Activity   Alcohol use: Yes    Comment: occasionally beer/wine   Drug use: No   Sexual activity: Yes     Partners: Male    Birth control/protection: None  Other Topics Concern   Not on file  Social History Narrative   Lives with husband   Exercise walking 2 x weekly   Patient's family history -  UNKNOWN ADOPTED   Social Determinants of Health   Financial Resource Strain: Not on file  Food Insecurity: Not on file  Transportation Needs: Not on file  Physical Activity: Not on file  Stress: Not on file  Social Connections: Not on file     FAMILY HISTORY:  We obtained a detailed, 4-generation family history.   Family History  Adopted: Yes     [name] was adopted and has limited information about her biological relatives. Ms. Loretto is unaware of previous family history of genetic testing for hereditary cancer risks. Patient's maternal and paternal ancestors are from the [place]. There is no reported Ashkenazi Jewish ancestry.   GENETIC COUNSELING ASSESSMENT: [name] is a 65 y.o. female with a personal history of cancer and colon polyps which is somewhat suggestive of a hereditary cancer syndrome and predisposition to cancer given her age of diagnosis and the presence of multiple tubular adenomas. We, therefore, discussed and recommended the following at today's visit.   DISCUSSION: We discussed that 5 - 10% of cancer is hereditary, with most cases of hereditary colon associated with mutations in the Lynch syndrome genes.  There are other genes that can be associated with hereditary colon cancer syndromes and polyposis.  We discussed that testing is beneficial for several reasons including knowing how to follow individuals for their cancer risks and understanding if other family members could be at risk for cancer and allowing them to undergo genetic testing.   We reviewed the characteristics, features and inheritance patterns of hereditary cancer syndromes. We also discussed genetic testing, including appropriate family members to test, the process of testing, insurance coverage  and turn-around-time for results. We discussed the implications of a negative, positive, carrier and/or variant of uncertain significant result. We recommended Ms. Smaldone pursue genetic testing for a panel that includes genes associated with colon cancer and colon polyps.   Ms. Cristo  was offered a common hereditary cancer panel (47 genes) and an expanded pan-cancer panel (84 genes). Ms. Batty was informed of the benefits and limitations of each panel, including that expanded pan-cancer panels contain genes that do not have clear management guidelines at this point in time.  We also discussed that as the number of genes included on a panel increases, the chances of variants of uncertain significance increases.  After considering the benefits and limitations of each gene panel, Ms. Celine Ahr  elected to have an expanded pan-cancer panel through [facility].  The Multi-Cancer + RNA Panel offered by [facility] includes sequencing and/or deletion/duplication analysis of the following 84 genes:  AIP*, ALK, APC*, ATM*, AXIN2*, BAP1*, BARD1*, BLM*, BMPR1A*, BRCA1*, BRCA2*, BRIP1*, CASR, CDC73*, CDH1*, CDK4, CDKN1B*, CDKN1C*, CDKN2A, CEBPA, CHEK2*, CTNNA1*, DICER1*, DIS3L2*, EGFR, EPCAM, FH*, FLCN*, GATA2*, GPC3, GREM1, HOXB13, HRAS, KIT, MAX*, MEN1*, MET, MITF, MLH1*, MSH2*, MSH3*, MSH6*, MUTYH*, NBN*, NF1*, NF2*, NTHL1*, PALB2*, PDGFRA, PHOX2B, PMS2*, POLD1*, POLE*, POT1*, PRKAR1A*, PTCH1*, PTEN*, RAD50*, RAD51C*, RAD51D*, RB1*, RECQL4, RET, RUNX1*, SDHA*, SDHAF2*, SDHB*, SDHC*, SDHD*, SMAD4*, SMARCA4*, SMARCB1*, SMARCE1*, STK11*, SUFU*, TERC, TERT, TMEM127*, Tp53*, TSC1*, TSC2*, VHL*, WRN*, and WT1.  RNA analysis is performed for * genes.  Based on Ms. Herrington's personal history of colon cancer before age 89 and her personal history of more than 10 tubular adenomas, she meets medical criteria for genetic testing. Despite that she meets criteria, she may still have an out of pocket cost. We discussed that if she has an out  of pocket cost for testing, the laboratory should reach out to discuss self-pay options and/or patient pay assistance programs.   PLAN: After considering the risks, benefits, and limitations, Ms. Cancelliere provided informed consent to pursue genetic testing and the blood sample was sent to [facility] for analysis of the Multi-Cancer +RNA Panel. Results should be available within approximately 3 weeks' time, at which point they will be disclosed by telephone to Ms. Falotico, as will any additional recommendations warranted by these results. Ms. Dino will receive a summary of her genetic counseling visit and a copy of her results once available. This information will also be available in Epic.   Lastly, we encouraged Ms. Blucher to remain in contact with cancer genetics annually so that we can continuously update the family history and inform her of any changes in cancer genetics and testing that may be of benefit for this family.   Ms. Milius questions were answered to her satisfaction today. Our contact information was provided should additional questions or concerns arise. Thank you for the referral and allowing [name] to share in the care of your patient.   Carlito Bogert M. Joette Catching, [place], [facility] Genetic Counselor [email or web address] (P) [phone]  The patient was seen for a total of 30 minutes in face-to-face genetic counseling.  The patient was seen alone.  Drs. Magrinat, Lindi Adie and/or Burr Medico were available to discuss this case as needed.    _______________________________________________________________________ For Office Staff:  Number of people involved in session: 1 Was an Intern/ student involved with case: no

## 2021-06-23 ENCOUNTER — Other Ambulatory Visit: Payer: Self-pay

## 2021-06-23 MED ORDER — LISINOPRIL-HYDROCHLOROTHIAZIDE 20-12.5 MG PO TABS: 1.0000 | ORAL_TABLET | Freq: Every day | ORAL | 0 refills | Status: DC

## 2021-06-23 NOTE — Telephone Encounter (Signed)
Lisinopril-HCTZ 20-12.5 mg # 30 tablets only sent to [place] with message that patient needs appointment for future refills./ 1st attempt

## 2021-06-25 ENCOUNTER — Telehealth: Payer: Self-pay | Admitting: Genetic Counselor

## 2021-06-25 ENCOUNTER — Ambulatory Visit: Payer: Self-pay | Admitting: Genetic Counselor

## 2021-06-25 ENCOUNTER — Encounter: Payer: Self-pay | Admitting: Genetic Counselor

## 2021-06-25 DIAGNOSIS — Z85038 Personal history of other malignant neoplasm of large intestine: Secondary | ICD-10-CM

## 2021-06-25 DIAGNOSIS — Z1589 Genetic susceptibility to other disease: Secondary | ICD-10-CM | POA: Insufficient documentation

## 2021-06-25 DIAGNOSIS — Z1379 Encounter for other screening for genetic and chromosomal anomalies: Secondary | ICD-10-CM

## 2021-06-25 DIAGNOSIS — Z86010 Personal history of colonic polyps: Secondary | ICD-10-CM

## 2021-06-25 HISTORY — DX: Genetic susceptibility to other disease: Z15.89

## 2021-06-25 NOTE — Progress Notes (Signed)
GENETIC TEST RESULTS  Patient Name: [name] [name] Patient Age: 65 y.o. Encounter Date: 06/25/2021  Referring Provider: Mauri Pole, MD  [name] [name] was seen in the [place] clinic on May 20, 2021 due to a personal history of colon cancer, a personal history of colon polyps, and concern regarding a hereditary predisposition to cancer in the family. Please refer to the prior Genetics clinic note for more information regarding [name] [name]'s medical and family histories and our assessment at the time.   FAMILY HISTORY:  Family History  Adopted: Yes     Ms. Rosen was adopted and has limited information about her biological relatives. Ms. Pajak is unaware of previous family history of genetic testing for hereditary cancer risks. Patient's maternal and paternal ancestors are from the [place]. There is no reported Ashkenazi Jewish ancestry.    GENETIC TESTING:  At the time of [name] [name]'s visit, we recommended she pursue genetic testing of the [facility] Multi-Cancer +RNA panel test. 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   There were no deleterious mutations in other genes.  The Multi-Cancer + RNA Panel offered by [facility] includes sequencing and/or deletion/duplication analysis of the following 84 genes:  AIP*, ALK, APC*, ATM*, AXIN2*, BAP1*, BARD1*, BLM*, BMPR1A*, BRCA1*, BRCA2*, BRIP1*, CASR, CDC73*, CDH1*, CDK4, CDKN1B*, CDKN1C*, CDKN2A, CEBPA, CHEK2*, CTNNA1*, DICER1*, DIS3L2*, EGFR, EPCAM, FH*, FLCN*, GATA2*, GPC3, GREM1, HOXB13, HRAS, KIT, MAX*, MEN1*, MET, MITF, MLH1*, MSH2*, MSH3*, MSH6*, MUTYH*, NBN*, NF1*, NF2*, NTHL1*, PALB2*, PDGFRA, PHOX2B, PMS2*, POLD1*, POLE*, POT1*, PRKAR1A*, PTCH1*, PTEN*, RAD50*, RAD51C*, RAD51D*, RB1*, RECQL4, RET, RUNX1*, SDHA*, SDHAF2*, SDHB*, SDHC*, SDHD*, SMAD4*, SMARCA4*, SMARCB1*, SMARCE1*, STK11*, SUFU*, TERC, TERT, TMEM127*, Tp53*, TSC1*, TSC2*, VHL*, WRN*, and WT1.   RNA analysis is performed for * genes.  The test report will be scanned into EPIC and located under the Molecular Pathology section of the Results Review tab.  A portion of the result report is included below for reference.  The report date is  June 17, 2021.     [name] [name] genetic test results do not explain why she has a personal history of colon cancer or colon polyps. It is possible there may be a gene mutation in one of these genes that current testing cannot detect, but that chance is small. There could be another gene that has not yet been discovered, or that we have not yet tested, that is responsible for the cancer diagnoses in the family. Therefore, it is important to remain in touch with cancer genetics in the future so that we can continue to offer [name] [name] the most up to date genetic testing.   SDHA MUTATION:  Clinical condition Single pathogenic variants in the SDHA gene are associated with the development of paragangliomas (PGL) and pheochromocytomas [facility]) as seen in hereditary paraganglioma-pheochromocytoma syndrome, gastrointestinal stromal tumors (GIST), and mitochondrial complex II deficiency. It is unclear if pathogenic variants in [facility] are associated with other cancers as the data are limited and emerging (PMID: 88416606, 30160109). The clinical presentation is highly variable among those with a pathogenic variant in [place] and may be difficult to predict. An individual with a single SDHA pathogenic variant will not necessarily develop cancer in their lifetime, but the risk for cancer is increased over that of the general population.  Paragangliomas/Pheochromocytomas Paragangliomas (PGL) are rare, adult-onset, typically benign neuroendocrine tumors that arise from paraganglia. Paraganglia are a collection of neuroendocrine tissues that are distributed throughout the body, from the middle ear and  skull base (called head-and-neck paragangliomas, or HNP) to the pelvis. PGLs  located outside the head and neck most commonly occur in the adrenal glands and are called pheochromocytomas [facility]). They are also known as chromaffin tumors. PCCs are catecholamine-secreting PGLs that are confined to the adrenal medulla, as defined by the The World Health Organization Tumor Classification; however, this term may also be used to refer to catecholamine-producing PGLs regardless of whether they are adrenal or extra-adrenal (PMID: 33545625). These lesions can cause excessive production of adrenal hormones, resulting in hypertension, headaches, tachycardia, anxiety, and sweaty or clammy skin in some individuals (PMID: 63893734, 28768115, 72620355). Most cases of PGL and [place] are sporadic, but approximately one-third are familial and due to an identifiable pathogenic variant in a susceptibility gene, [name] as SDHA, that can result in hereditary paraganglioma-pheochromocytoma syndrome (PMID: 97416384, 53646803, 21224825, 00370488, 89169450).  Pathogenic variants in SDHA are primarily associated with a risk of developing [place], but sympathetic non-adrenal PGL of abdomen and thorax as well as HNP have been reported (PMID: 38882800, 34917915). The age at diagnosis is highly variable (PMID: 05697948) and the risk of malignancy appears to be low(PMID: 01655374, 82707867). Additionally, the penetrance appears to be low, meaning most individuals with a pathogenic variant in this gene will not develop PGL (PMID: 54492010).  The estimated lifetime risk of paragangliomas and pheochromocytomas for those with an SHDA mutation is approximately 1-10%.   Gastrointestinal stromal tumors Gastrointestinal stromal tumors (GIST) are rare, typically adult-onset sarcomas that may be either benign or malignant. They can occur sporadically or due to a heritable pathogenic variant in a susceptibility gene, [name] as SDHA. GISTs can develop anywhere along the GI tract, which includes the esophagus, stomach, gallbladder, liver, small  intestine, colon, rectum, anus, and lining of the gut. GISTs arise from a specific cell type, interstitial cells of [place] (ICC), which line the walls of the GI tract. More than half of GISTs start in the stomach. Gastrointestinal stromal tumor. Accessed June 2015). GISTs may develop in individuals with pathogenic variants in [facility] (PMID: 07121975, 88325498, 26415830).  Mitochondrial Complex II Deficiency  Alternatively, SDHA variants can cause mitochondrial complex II deficiency, which is a rare condition that can affect various parts of the body in children and adults. Symptoms can vary widely, even within the same family, and may include brain and muscle diseases (encephalopathy, myopathy, hypotonia, leukodystrophy), poor growth, short stature, joint contractures and enlargement of the heart (cardiomyopathy). Mitochondrial complex II deficiency most often occurs when an individual carries two SDHA variants (autosomal recessive), but in some rare cases can be caused by just one SDHA variant (autosomal dominant).   Management will differ depending on the condition associated with the SDHA variant.   MANAGEMENT FOR HEREDITARY PGL/PCC:  It is suggested that individuals with hereditary paraganglioma-pheochromocytoma syndrome, along with their at-risk relatives, have regular clinical monitoring by a physician or medical team with expertise in the treatment of hereditary GIST and PGL/PCC syndromes. A consultation with an endocrine surgeon, endocrinologist, and otolaryngologist is also recommended to establish an individualized care plan (PMID: 94076808).  Screening should begin between 48 and 48 years of age, or at least ten years before the earliest age at diagnosis in the family (PMID: 81103159). Lifelong annual biochemical and clinical surveillance has been suggested (PMID:  45859292; NCCN Guidelines Version 1.2022 Neuroendocrine and Adrenal Tumors; NCCN Guidelines Version 2.2022 Gastrointestinal Stromal  Tumors (GISTs).   Paraganglioma/Pheochromocytoma:  Blood pressure monitoring at all medical visits starting at age 54-15 Screening blood test for  plasma-free metanephrines or 24-hour urine test for fractionated metanephrines every year starting at age 71-15  Blood and/or urine screening should be done prior to any surgical procedures.   Cross-sectional imaging of the skull base to pelvis every 2-3 years starting at age 68-15  This can be done by whole-body MRI (if available) or other non-radiation-containing imaging procedures.   If whole-body MRI is not available, may consider abdominal MRI, skull base and neck MRI, and chest CT.  Due to variability of SDH genes in tumor penetrance and risk of malignancy, consider modified screening intervals, especially for less penetrant genes [name] as SDHA.   Education about the signs and symptoms of PGL and [place] tumors  Symptoms of these tumors may include a lump that gets bigger over time, hearing loss, ringing in the ears, difficulty swallowing, hoarseness of the voice, problems moving the tongue, unexplained sweating, headaches, heart palpitations, paleness, and/or feelings of anxiety or panic Experiencing these symptoms should warrant a prompt medical evaluation.   Gastrointestinal stromal tumor Additional imaging to screen for GIST is not recommended, however, searching for GIST on the imaging performed to screen for PGL-PCC may be considered   Kidney cancer Abdominal MRI (preferred) every 4-6 years starting at age 86, or 10 years before the earliest kidney cancer in the family (whichever comes first). If an abdominal MRI is unavailable, consider a CT with and without IV contrast. MRI is preferred for surveillance to limit radiation exposure; however, CT can be used for surgical planning as needed. If lesions are detected, increase imaging frequency based on growth rate and size.  If whole-body MRI is being done for PGL-PCC screening, additional imaging  of the kidneys may not be necessary.   It is important to note that individuals with a pathogenic SDHA variant may be at a greater risk of developing PGL or [place] when living in higher altitudes or when chronically exposed to hypoxic conditions. In general, inactivating mutations in one of the succinate dehydrogenase genes, which includes SDHA, can lead to accumulation of succinate, the formation of reactive oxygen species, and the activation of hypoxia-dependent pathways (PMID: 30865784). Avoidance of living at high altitudes and activities that promote long-term exposure to hypoxia and predispose to chronic lung disease (e.g., smoking) is therefore encouraged (PMID: 69629528).  An individuals cancer risk and medical management are not determined by genetic test results alone. Overall cancer risk assessment incorporates additional factors, including personal medical history, family history, and any available genetic information that may result in a personalized plan for cancer prevention and surveillance.  FAMILY MEMBERS:  Pathogenic variants in SDHA resulting in GIST, PGL, and/or [place] have autosomal dominant inheritance. This means that an individual with a pathogenic variant has a 50% chance of passing the condition on to their offspring. It is important that all of Ms. Kau's relatives (both males and females) know of the presence of this gene mutation. Site-specific genetic testing can sort out who in the family is at risk and who is not.    Ms. Kuyper son has a 50% chance to have inherited this mutation. We recommend they have genetic testing for this same mutation, as identifying the presence of this mutation would allow them to also take advantage of risk-reducing measures. Interested relatives may contact me at [phone] if they have questions or concerns regarding the family history.  Alternatively, they may search the [employer] [place] at [email or web address] to  locate the nearest cancer Dietitian.    Those  with a pathogenic variant in SDHA are also carriers of mitochondrial complex II deficiency syndrome. Individuals in the family may also wish to have genetic testing to inform their risk of having a child with autosomal recessive mitochondrial complex II deficiency, as described above.   First degree relatives of those with colon cancer should receive colonoscopies beginning at age 58, or 10 years prior to the earliest diagnosis of colon cancer in the family, and receive colonoscopies at least every 5 years, or as recommended by their gastroenterologist.    PLAN: Ms. Brandel will need to be followed as high risk based on her diagnosis of a SDHA mutation.    Ms. Stolarz does not have an endocrinologist to perform follow up for the diagnosis with an SDHA mutation.  We recommend that Ms. Zarzycki's primary care provider place a referral for an endocrinologist with experiencing in caring for patients with SDHx mutations, [name] as Dr. Delrae Rend [phone]).   Ms. Kobler did not report concerning features for autosomal dominant mitochondrial complex II deficiency at this time.  If signs/symptoms develop, an evaluation by a medical geneticist or mitochondrial expert is indicated.   SUPPORT RESOURCES:   The [employer] at www.pheopara.org is a TEFL teacher for both physicians and patients dealing with neuroendocrine diseases.   We encouraged Ms. Gras to remain in contact with [name] on an annual basis so we can update her personal and family histories, and let her know of advances in cancer genetics that may benefit the family. Our contact number was provided. Ms. Amenta questions were answered to her satisfaction today, and she knows she is welcome to call anytime with additional questions.    Keonna Raether M. Joette Catching, [place], [facility] Genetic Counselor [email or web address] (P) [phone]

## 2021-06-25 NOTE — Telephone Encounter (Signed)
Revealed SDHA mutation.  Discussed cancer risks, management, and implications for family members.  Recommended referral to endocrinologist.

## 2021-06-28 ENCOUNTER — Encounter: Payer: Self-pay | Admitting: Genetic Counselor

## 2021-07-10 ENCOUNTER — Other Ambulatory Visit: Payer: Self-pay | Admitting: Cardiology

## 2021-07-15 LAB — HM MAMMOGRAPHY

## 2021-07-17 ENCOUNTER — Other Ambulatory Visit: Payer: Self-pay | Admitting: Emergency Medicine

## 2021-07-17 DIAGNOSIS — M4722 Other spondylosis with radiculopathy, cervical region: Secondary | ICD-10-CM

## 2021-07-21 ENCOUNTER — Other Ambulatory Visit: Payer: Self-pay | Admitting: Cardiology

## 2021-07-29 ENCOUNTER — Other Ambulatory Visit: Payer: Self-pay | Admitting: Emergency Medicine

## 2021-07-29 ENCOUNTER — Telehealth: Payer: Self-pay | Admitting: Emergency Medicine

## 2021-07-29 DIAGNOSIS — R898 Other abnormal findings in specimens from other organs, systems and tissues: Secondary | ICD-10-CM

## 2021-07-29 NOTE — Telephone Encounter (Signed)
Referral placed  Thanks!

## 2021-07-29 NOTE — Telephone Encounter (Signed)
Patient state she had genetic testing done at [facility] at the end of last year (patient does not know the exact date)  Patient states she was told a request for a referral had been made to the provider to an endocrinologist due to her test results  Informed patient I could not locate the referral  Patient states she would like to request the referral  Please advise

## 2021-08-07 ENCOUNTER — Other Ambulatory Visit: Payer: Self-pay | Admitting: Cardiology

## 2021-08-12 ENCOUNTER — Telehealth: Payer: Self-pay | Admitting: Cardiology

## 2021-08-12 MED ORDER — LISINOPRIL-HYDROCHLOROTHIAZIDE 20-12.5 MG PO TABS: 1.0000 | ORAL_TABLET | Freq: Every day | ORAL | 0 refills | Status: DC

## 2021-08-12 NOTE — Telephone Encounter (Signed)
°*  STAT* If patient is at the pharmacy, call can be transferred to refill team.   1. Which medications need to be refilled? (please list name of each medication and dose if known) Lisinopril  2. Which pharmacy/location (including street and city if local pharmacy) is medication to be sent to? [facility] [address], [place]  3. Do they need a 30 day or 90 day supply? Patient need enough medicine until the first available appointment on 10-28-21

## 2021-08-12 NOTE — Telephone Encounter (Signed)
Refill sent in per request.  

## 2021-08-19 ENCOUNTER — Other Ambulatory Visit: Payer: Self-pay | Admitting: Cardiology

## 2021-08-23 ENCOUNTER — Encounter: Payer: Self-pay | Admitting: Emergency Medicine

## 2021-08-26 ENCOUNTER — Other Ambulatory Visit: Payer: Self-pay | Admitting: Gastroenterology

## 2021-09-17 ENCOUNTER — Encounter: Payer: Self-pay | Admitting: Emergency Medicine

## 2021-10-12 ENCOUNTER — Other Ambulatory Visit: Payer: Self-pay | Admitting: Emergency Medicine

## 2021-10-12 ENCOUNTER — Encounter: Payer: Self-pay | Admitting: Emergency Medicine

## 2021-10-12 ENCOUNTER — Ambulatory Visit (INDEPENDENT_AMBULATORY_CARE_PROVIDER_SITE_OTHER): Payer: 59 | Admitting: Emergency Medicine

## 2021-10-12 VITALS — BP 126/74 | HR 75 | Temp 97.7°F | Ht 63.0 in | Wt 148.5 lb

## 2021-10-12 DIAGNOSIS — Z0000 Encounter for general adult medical examination without abnormal findings: Secondary | ICD-10-CM | POA: Diagnosis not present

## 2021-10-12 DIAGNOSIS — K760 Fatty (change of) liver, not elsewhere classified: Secondary | ICD-10-CM | POA: Diagnosis not present

## 2021-10-12 DIAGNOSIS — E785 Hyperlipidemia, unspecified: Secondary | ICD-10-CM | POA: Diagnosis not present

## 2021-10-12 DIAGNOSIS — Z114 Encounter for screening for human immunodeficiency virus [HIV]: Secondary | ICD-10-CM

## 2021-10-12 DIAGNOSIS — I700 Atherosclerosis of aorta: Secondary | ICD-10-CM

## 2021-10-12 DIAGNOSIS — M4722 Other spondylosis with radiculopathy, cervical region: Secondary | ICD-10-CM

## 2021-10-12 DIAGNOSIS — R7303 Prediabetes: Secondary | ICD-10-CM | POA: Diagnosis not present

## 2021-10-12 DIAGNOSIS — Z23 Encounter for immunization: Secondary | ICD-10-CM | POA: Diagnosis not present

## 2021-10-12 DIAGNOSIS — Z13820 Encounter for screening for osteoporosis: Secondary | ICD-10-CM

## 2021-10-12 DIAGNOSIS — I10 Essential (primary) hypertension: Secondary | ICD-10-CM | POA: Diagnosis not present

## 2021-10-12 DIAGNOSIS — I7789 Other specified disorders of arteries and arterioles: Secondary | ICD-10-CM

## 2021-10-12 LAB — CBC WITH DIFFERENTIAL/PLATELET
Basophils Absolute: 0 10*3/uL (ref 0.0–0.1)
Basophils Relative: 0.9 % (ref 0.0–3.0)
Eosinophils Absolute: 0 10*3/uL (ref 0.0–0.7)
Eosinophils Relative: 0.5 % (ref 0.0–5.0)
HCT: 40.1 % (ref 36.0–46.0)
Hemoglobin: 13.7 g/dL (ref 12.0–15.0)
Lymphocytes Relative: 26.6 % (ref 12.0–46.0)
Lymphs Abs: 1.3 10*3/uL (ref 0.7–4.0)
MCHC: 34.3 g/dL (ref 30.0–36.0)
MCV: 90.5 fl (ref 78.0–100.0)
Monocytes Absolute: 0.5 10*3/uL (ref 0.1–1.0)
Monocytes Relative: 9.7 % (ref 3.0–12.0)
Neutro Abs: 3 10*3/uL (ref 1.4–7.7)
Neutrophils Relative %: 62.3 % (ref 43.0–77.0)
Platelets: 165 10*3/uL (ref 150.0–400.0)
RBC: 4.43 Mil/uL (ref 3.87–5.11)
RDW: 12.8 % (ref 11.5–15.5)
WBC: 4.8 10*3/uL (ref 4.0–10.5)

## 2021-10-12 LAB — COMPREHENSIVE METABOLIC PANEL
ALT: 23 U/L (ref 0–35)
AST: 22 U/L (ref 0–37)
Albumin: 4.5 g/dL (ref 3.5–5.2)
Alkaline Phosphatase: 48 U/L (ref 39–117)
BUN: 9 mg/dL (ref 6–23)
CO2: 29 mEq/L (ref 19–32)
Calcium: 9.5 mg/dL (ref 8.4–10.5)
Chloride: 95 mEq/L — ABNORMAL LOW (ref 96–112)
Creatinine, Ser: 0.66 mg/dL (ref 0.40–1.20)
GFR: 92.23 mL/min (ref >60.00)
Glucose, Bld: 126 mg/dL — ABNORMAL HIGH (ref 70–99)
Potassium: 4.1 mEq/L (ref 3.5–5.1)
Sodium: 131 mEq/L — ABNORMAL LOW (ref 135–145)
Total Bilirubin: 0.8 mg/dL (ref 0.2–1.2)
Total Protein: 7.3 g/dL (ref 6.0–8.3)

## 2021-10-12 LAB — LIPID PANEL
Cholesterol: 167 mg/dL (ref 0–200)
HDL: 68 mg/dL (ref >39.00)
LDL Cholesterol: 71 mg/dL (ref 0–99)
NonHDL: 99.27
Total CHOL/HDL Ratio: 2
Triglycerides: 140 mg/dL (ref 0.0–149.0)
VLDL: 28 mg/dL (ref 0.0–40.0)

## 2021-10-12 LAB — HEMOGLOBIN A1C: Hgb A1c MFr Bld: 6.3 % (ref 4.6–6.5)

## 2021-10-12 MED ORDER — MELOXICAM 15 MG PO TABS: 15.0000 mg | ORAL_TABLET | Freq: Every day | ORAL | 3 refills | Status: DC

## 2021-10-12 NOTE — Progress Notes (Signed)
[name] [name] ?66 y.o. ? ? ?Chief Complaint  ?Patient presents with  ? Annual Exam  ? ? ?HISTORY OF PRESENT ILLNESS: ?This is a 66 y.o. female here for her annual exam. ?Doing well.  Has no complaints or medical concerns today. ?Has history of prediabetes, borderline diabetes.  On no medications. ?Has history of chronic headaches for which she takes gabapentin. ?History of hypertension on Zestoretic 20-12.5 mg daily. ?History of dyslipidemia on simvastatin 40 mg daily. ?History of arthritis, status post bilateral hip replacements. ?Takes daily meloxicam ?History of thoracic aorta aneurysm, under surveillance. ?Has chronic pain from arthritis including neck pain. ?History of colon cancer.  Has GI appointment coming up soon. ? ?HPI ? ? ?Prior to Admission medications   ?Medication Sig Start Date End Date Taking? Authorizing Provider  ?Artificial Tear Ointment (DRY EYES OP) Apply to eye as needed.   Yes [provider]  ?gabapentin (NEURONTIN) 400 MG capsule Take 400 mg by mouth daily. 08/28/20  Yes [provider]  ?LINZESS 72 MCG capsule TAKE 1 CAPSULE BY MOUTH EVERY DAY BEFORE BREAKFAST 08/26/21  Yes Nandigam, Venia Minks, MD  ?lisinopril-hydrochlorothiazide (ZESTORETIC) 20-12.5 MG tablet Take 1 tablet by mouth daily. NEEDS APPOINTMENT FOR FUTURE REFILLS / 1ST ATTEMPT 08/12/21  Yes Richardo Priest, MD  ?meloxicam (MOBIC) 15 MG tablet TAKE 1 TABLET (15 MG TOTAL) BY MOUTH DAILY. 10/12/21  Yes Trinidee Schrag, Ines Bloomer, MD  ?Multiple Vitamins-Minerals (MULTIVITAMIN ADULT PO) Take by mouth daily.   Yes [provider]  ?simvastatin (ZOCOR) 40 MG tablet PLEASE SEE ATTACHED FOR DETAILED DIRECTIONS 08/10/21  Yes Richardo Priest, MD  ? ? ?No Known Allergies ? ?Patient Active Problem List  ? Diagnosis Date Noted  ? 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 History of colonic polyps 05/21/2021  ? Atherosclerosis of aorta ([place]) 03/08/2021  ? Steatosis of liver 03/08/2021  ? Dyslipidemia 03/08/2021  ?  Prediabetes 03/06/2020  ? Abnormal echocardiogram 05/08/2019  ? Enlarged thoracic aorta ([place]) 04/17/2019  ? Elevated cholesterol 02/14/2018  ? Status post bilateral total hip replacement 05/27/2014  ? ANXIETY DISORDER- mixed disorder w/ depressive symptoms 02/07/2014  ? PERIPHERAL NEUROPATHY 10/13/2007  ? Essential hypertension 10/13/2007  ? HERNIA, UMBILICAL 40/97/3532  ? COLONIC POLYPS, ADENOMATOUS 02/20/2003  ? DIVERTICULOSIS, COLON 02/20/2003  ? History of malignant neoplasm of large intestine 02/20/2003  ? ? ?Past Medical History:  ?Diagnosis Date  ? Anemia   ? hx of, not at this time  ? Anxiety   ? no meds  ? Aortic regurgitation   ? Arthritis   ? "right hip; left hip; most of my joints" (05/27/2014)  ? Blood transfusion without reported diagnosis   ? Phreesia 09/04/2020  ? Cancer [facility])   ? Phreesia 09/04/2020  ? Depression   ? Diverticulosis   ? Essential hypertension, benign   ? takes Lisinopril-HCTZ daily  ? Heart murmur   ? High cholesterol   ? takes Pravastatin daily  ? History of blood transfusion   ? no abnormal reaction noted 2015  ? History of colon polyps   ? benign  ? History of colonic polyps 05/21/2021  ? Hyperlipidemia   ? Phreesia 09/04/2020  ? Hypertension   ? Phreesia 09/04/2020  ? Insomnia   ? doesn't take any meds  ? Joint pain   ? 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 Neuromuscular disorder ([place])   ? Neuropathy   ? since chemo and radiation  ? Nocturia   ? PONV (  postoperative nausea and vomiting)   ? hallucinations  ? Rectosigmoid cancer ([place]) 2004   ? S/P Surgery, Chemo, Rad Tx,  (after chemo treatment menstral periods have ceased).  ? ? ?Past Surgical History:  ?Procedure Laterality Date  ? ANAL RECTAL MANOMETRY N/A 07/17/2017  ? Procedure: ANO RECTAL MANOMETRY;  Surgeon: Mauri Pole, MD;  [facility]: [facility];  Service: Endoscopy;  Laterality: N/A;  ? COLECTOMY  Aug 2004  ? S/P Low Anterior Resxn (Sigmoid Colon)  ? COLON SURGERY N/A   ? Phreesia 09/04/2020  ?  COLONOSCOPY    ? DILATION AND CURETTAGE OF UTERUS  1991  ? JOINT REPLACEMENT N/A   ? Phreesia 09/04/2020  ? [place] SURGERY  Feb 2007  ? SPINE SURGERY N/A   ? Phreesia 09/04/2020  ? TOTAL HIP ARTHROPLASTY Right 05/27/2014  ? Procedure: RIGHT TOTAL HIP ARTHROPLASTY ANTERIOR APPROACH;  Surgeon: Hessie Dibble, MD;  Location: [facility];  Service: Orthopedics;  Laterality: Right;  ? TOTAL HIP ARTHROPLASTY Left 08/25/2015  ? Procedure: TOTAL HIP ARTHROPLASTY ANTERIOR APPROACH;  Surgeon: Melrose Nakayama, MD;  Location: [place];  Service: Orthopedics;  Laterality: Left;  ? ? ?Social History  ? ?Socioeconomic History  ? Marital status: Married  ?  Spouse name: Nicole Kindred  ? Number of children: 1  ? Years of education: Not on file  ? Highest education level: Not on file  ?Occupational History  ? Occupation: office work  ?Tobacco Use  ? Smoking status: Former  ?  Types: Cigarettes  ? Smokeless tobacco: Never  ? Tobacco comments:  ?  "smoked some socially in my college days"  ?Vaping Use  ? Vaping Use: Never used  ?Substance and Sexual Activity  ? Alcohol use: Yes  ?  Comment: occasionally beer/wine  ? Drug use: No  ? Sexual activity: Yes  ?  Partners: Male  ?  Birth control/protection: None  ?Other Topics Concern  ? Not on file  ?Social History Narrative  ? Lives with husband  ? Exercise walking 2 x weekly  ? Patient's family history -  UNKNOWN ADOPTED  ? ?Social Determinants of Health  ? ?Financial Resource Strain: Not on file  ?Food Insecurity: Not on file  ?Transportation Needs: Not on file  ?Physical Activity: Not on file  ?Stress: Not on file  ?Social Connections: Not on file  ?Intimate Partner Violence: Not on file  ? ? ?Family History  ?Adopted: Yes  ? ? ? ?Review of Systems  ?Constitutional: Negative.  Negative for chills and fever.  ?HENT: Negative.  Negative for congestion and sore throat.   ?Respiratory: Negative.  Negative for cough and shortness of breath.   ?Cardiovascular: Negative.  Negative for chest pain and  palpitations.  ?Gastrointestinal: Negative.  Negative for abdominal pain, diarrhea, nausea and vomiting.  ?Genitourinary: Negative.   ?Musculoskeletal:  Positive for joint pain.  ?Skin: Negative.  Negative for rash.  ?Neurological:  Negative for dizziness and headaches.  ?All other systems reviewed and are negative. ?Today's Vitals  ? 10/12/21 1258  ?BP: 126/74  ?Pulse: 75  ?Temp: 97.7 ?F (36.5 ?C)  ?TempSrc: Oral  ?SpO2: 99%  ?Weight: 148 lb 8 oz (67.4 kg)  ?Height: '5\' 3"'$  (1.6 m)  ? ?Body mass index is 26.31 kg/m?. ? ? ?Physical Exam ?Vitals reviewed.  ?Constitutional:   ?   Appearance: Normal appearance.  ?HENT:  ?   Head: Normocephalic.  ?   Right Ear: Tympanic membrane, ear canal and external ear normal.  ?  Left Ear: Tympanic membrane, ear canal and external ear normal.  ?   Mouth/Throat:  ?   Mouth: Mucous membranes are moist.  ?   Pharynx: Oropharynx is clear.  ?Eyes:  ?   Extraocular Movements: Extraocular movements intact.  ?   Conjunctiva/sclera: Conjunctivae normal.  ?   Pupils: Pupils are equal, round, and reactive to light.  ?Cardiovascular:  ?   Rate and Rhythm: Normal rate and regular rhythm.  ?   Pulses: Normal pulses.  ?   Heart sounds: Normal heart sounds.  ?Pulmonary:  ?   Effort: Pulmonary effort is normal.  ?   Breath sounds: Normal breath sounds.  ?Abdominal:  ?   General: Bowel sounds are normal. There is no distension.  ?   Palpations: Abdomen is soft.  ?   Tenderness: There is no abdominal tenderness.  ?Musculoskeletal:  ?   Cervical back: No tenderness.  ?Lymphadenopathy:  ?   Cervical: No cervical adenopathy.  ?Skin: ?   General: Skin is warm and dry.  ?   Capillary Refill: Capillary refill takes less than 2 seconds.  ?Neurological:  ?   General: No focal deficit present.  ?   Mental Status: She is alert and oriented to person, place, and time.  ?Psychiatric:     ?   Mood and Affect: Mood normal.     ?   Behavior: Behavior normal.  ? ? ? ?ASSESSMENT & PLAN: ?Problem List Items Addressed  This Visit   ? ?  ? Cardiovascular and Mediastinum  ? Essential hypertension  ? Relevant Orders  ? CBC with Differential  ? Comprehensive metabolic panel  ? Enlarged thoracic aorta ([place])  ? Atherosclerosis of aorta ([place])

## 2021-10-12 NOTE — Patient Instructions (Signed)

## 2021-10-13 ENCOUNTER — Encounter: Payer: Self-pay | Admitting: Emergency Medicine

## 2021-10-13 ENCOUNTER — Other Ambulatory Visit: Payer: Self-pay | Admitting: Emergency Medicine

## 2021-10-13 DIAGNOSIS — I10 Essential (primary) hypertension: Secondary | ICD-10-CM

## 2021-10-13 LAB — HIV ANTIBODY (ROUTINE TESTING W REFLEX): HIV 1&2 Ab, 4th Generation: NONREACTIVE

## 2021-10-13 MED ORDER — LISINOPRIL 40 MG PO TABS: 40.0000 mg | ORAL_TABLET | Freq: Every day | ORAL | 3 refills | Status: DC

## 2021-10-13 NOTE — Progress Notes (Signed)
Blood work shows persistent hyponatremia. ?Patient has history of hypertension on Zestoretic 20-12.5 mg daily. ?BP Readings from Last 3 Encounters:  ?10/12/21 126/74  ?05/06/21 128/70  ?03/08/21 124/80  ?Recommend to stop hydrochlorothiazide and continue lisinopril at a slightly higher dose of 40 mg/day ?Advised to monitor blood pressure readings at home daily for the next several weeks and keep a log. ? ?

## 2021-10-23 ENCOUNTER — Other Ambulatory Visit: Payer: Self-pay | Admitting: Cardiology

## 2021-10-25 DIAGNOSIS — G4700 Insomnia, unspecified: Secondary | ICD-10-CM | POA: Insufficient documentation

## 2021-10-25 DIAGNOSIS — Z9289 Personal history of other medical treatment: Secondary | ICD-10-CM | POA: Insufficient documentation

## 2021-10-25 DIAGNOSIS — F32A Depression, unspecified: Secondary | ICD-10-CM | POA: Insufficient documentation

## 2021-10-25 DIAGNOSIS — I351 Nonrheumatic aortic (valve) insufficiency: Secondary | ICD-10-CM | POA: Insufficient documentation

## 2021-10-25 DIAGNOSIS — Z9889 Other specified postprocedural states: Secondary | ICD-10-CM | POA: Insufficient documentation

## 2021-10-25 DIAGNOSIS — R112 Nausea with vomiting, unspecified: Secondary | ICD-10-CM | POA: Insufficient documentation

## 2021-10-25 DIAGNOSIS — C801 Malignant (primary) neoplasm, unspecified: Secondary | ICD-10-CM | POA: Insufficient documentation

## 2021-10-25 DIAGNOSIS — D649 Anemia, unspecified: Secondary | ICD-10-CM | POA: Insufficient documentation

## 2021-10-25 DIAGNOSIS — G629 Polyneuropathy, unspecified: Secondary | ICD-10-CM | POA: Insufficient documentation

## 2021-10-25 DIAGNOSIS — K5790 Diverticulosis of intestine, part unspecified, without perforation or abscess without bleeding: Secondary | ICD-10-CM | POA: Insufficient documentation

## 2021-10-25 DIAGNOSIS — M2550 Pain in unspecified joint: Secondary | ICD-10-CM | POA: Insufficient documentation

## 2021-10-25 DIAGNOSIS — R011 Cardiac murmur, unspecified: Secondary | ICD-10-CM | POA: Insufficient documentation

## 2021-10-25 DIAGNOSIS — R351 Nocturia: Secondary | ICD-10-CM | POA: Insufficient documentation

## 2021-10-25 DIAGNOSIS — M1990 Unspecified osteoarthritis, unspecified site: Secondary | ICD-10-CM | POA: Insufficient documentation

## 2021-10-26 ENCOUNTER — Other Ambulatory Visit: Payer: Self-pay | Admitting: Cardiology

## 2021-10-26 NOTE — Telephone Encounter (Signed)
Patient has an appointment on 10/28/21. ?

## 2021-10-28 ENCOUNTER — Ambulatory Visit: Payer: 59 | Admitting: Cardiology

## 2021-10-28 ENCOUNTER — Encounter: Payer: Self-pay | Admitting: Cardiology

## 2021-10-28 VITALS — BP 166/76 | HR 83 | Ht 63.0 in | Wt 148.0 lb

## 2021-10-28 DIAGNOSIS — I351 Nonrheumatic aortic (valve) insufficiency: Secondary | ICD-10-CM

## 2021-10-28 DIAGNOSIS — I10 Essential (primary) hypertension: Secondary | ICD-10-CM

## 2021-10-28 DIAGNOSIS — I2510 Atherosclerotic heart disease of native coronary artery without angina pectoris: Secondary | ICD-10-CM

## 2021-10-28 DIAGNOSIS — I7121 Aneurysm of the ascending aorta, without rupture: Secondary | ICD-10-CM

## 2021-10-28 DIAGNOSIS — E782 Mixed hyperlipidemia: Secondary | ICD-10-CM

## 2021-10-28 DIAGNOSIS — I700 Atherosclerosis of aorta: Secondary | ICD-10-CM

## 2021-10-28 MED ORDER — AMOXICILLIN 500 MG PO CAPS: 500.0000 mg | ORAL_CAPSULE | Freq: Every day | ORAL | 0 refills | Status: DC

## 2021-10-28 MED ORDER — SIMVASTATIN 40 MG PO TABS: ORAL_TABLET | ORAL | 3 refills | Status: DC

## 2021-10-28 NOTE — Patient Instructions (Signed)
Medication Instructions:  ?Your physician has recommended you make the following change in your medication:  ? ?START: Amoxicillin 500 mg daily - 45 minutes pre-dental procedure ? ?*If you need a refill on your cardiac medications before your next appointment, please call your pharmacy* ? ? ?Lab Work: ?None ?If you have labs (blood work) drawn today and your tests are completely normal, you will receive your results only by: ?MyChart Message (if you have MyChart) OR ?A paper copy in the mail ?If you have any lab test that is abnormal or we need to change your treatment, we will call you to review the results. ? ? ?Testing/Procedures: ?Your physician has requested that you have an echocardiogram. Echocardiography is a painless test that uses sound waves to create images of your heart. It provides your doctor with information about the size and shape of your heart and how well your heart?s chambers and valves are working. This procedure takes approximately one hour. There are no restrictions for this procedure.  ? ? ?Follow-Up: ?At [facility], you and your health needs are our priority.  As part of our continuing mission to provide you with exceptional heart care, we have created designated Provider Care Teams.  These Care Teams include your primary Cardiologist (physician) and Advanced Practice Providers (APPs -  Physician Assistants and Nurse Practitioners) who all work together to provide you with the care you need, when you need it. ? ?We recommend signing up for the patient portal called "MyChart".  Sign up information is provided on this After Visit Summary.  MyChart is used to connect with patients for Virtual Visits (Telemedicine).  Patients are able to view lab/test results, encounter notes, upcoming appointments, etc.  Non-urgent messages can be sent to your provider as well.   ?To learn more about what you can do with MyChart, go to [email or web address].   ? ?Your next appointment:   ?9  month(s) ? ?The format for your next appointment:   ?In Person ? ?Provider:   ?Shirlee More, MD  ? ? ?Other Instructions ?If blood pressures consistently greater than 140 send in via MyChart ? ?Important Information About Sugar ? ? ? ? ? ? ? ? ? ?Healthbeat  ?Tips to measure your blood pressure correctly ? ?To determine whether you have hypertension, a medical professional will take a blood pressure reading. How you prepare for the test, the position of your arm, and other factors can change a blood pressure reading by 10% or more. That could be enough to hide high blood pressure, start you on a drug you don't really need, or lead your doctor to incorrectly adjust your medications. ?National and international guidelines offer specific instructions for measuring blood pressure. If a doctor, nurse, or medical assistant isn't doing it right, don't hesitate to ask him or her to get with the guidelines. ?Here's what you can do to ensure a correct reading: ? Don't drink a caffeinated beverage or smoke during the 30 minutes before the test. ? Sit quietly for five minutes before the test begins. ? During the measurement, sit in a chair with your feet on the floor and your arm supported so your elbow is at about heart level. ? The inflatable part of the cuff should completely cover at least 80% of your upper arm, and the cuff should be placed on bare skin, not over a shirt. ? Don't talk during the measurement. ? Have your blood pressure measured twice, with a brief break in between. If the readings  are different by 5 points or more, have it done a third time. ?There are times to break these rules. If you sometimes feel lightheaded when getting out of bed in the morning or when you stand after sitting, you should have your blood pressure checked while seated and then while standing to see if it falls from one position to the next. ?Because blood pressure varies throughout the day, your doctor will rarely diagnose hypertension  on the basis of a single reading. Instead, he or she will want to confirm the measurements on at least two occasions, usually within a few weeks of one another. The exception to this rule is if you have a blood pressure reading of 180/110 mm Hg or higher. A result this high usually calls for prompt treatment. ?It's also a good idea to have your blood pressure measured in both arms at least once, since the reading in one arm (usually the right) may be higher than that in the left. A 2014 study in The American Journal of Medicine of nearly 3,400 people found average arm- to-arm differences in systolic blood pressure of about 5 points. The higher number should be used to make treatment decisions. ?In 2017, new guidelines from the [place], the [employer] of Cardiology, and nine other health organizations lowered the diagnosis of high blood pressure to 130/80 mm Hg or higher for all adults. The guidelines also redefined the various blood pressure categories to now include normal, elevated, Stage 1 hypertension, Stage 2 hypertension, and hypertensive crisis (see "Blood pressure categories"). ?Blood pressure categories  ?Blood pressure category SYSTOLIC ?(upper number)  DIASTOLIC ?(lower number)  ?Normal Less than 120 mm Hg and Less than 80 mm Hg  ?Elevated 120-129 mm Hg and Less than 80 mm Hg  ?High blood pressure: Stage 1 hypertension 130-139 mm Hg or 80-89 mm Hg  ?High blood pressure: Stage 2 hypertension 140 mm Hg or higher or 90 mm Hg or higher  ?Hypertensive crisis (consult your doctor immediately) Higher than 180 mm Hg and/or Higher than 120 mm Hg  ?Source: American Heart Association and American Stroke Association. ?For more on getting your blood pressure under control, buy Controlling Your Blood Pressure, a Special Health Report from [facility].  ?

## 2021-10-28 NOTE — Progress Notes (Signed)
?Cardiology Office Note:   ? ?Date:  10/28/2021  ? ?ID:  [name] [name], DOB 1956/04/19, MRN [id number] ? ?PCP:  Horald Pollen, MD  ?Cardiologist:  Shirlee More, MD   ? ?Referring MD: Horald Pollen, *  ? ? ?ASSESSMENT:   ? ?1. Aneurysm of ascending aorta without rupture (HCC)   ?2. Aortic valve insufficiency, etiology of cardiac valve disease unspecified   ?3. Essential hypertension   ?4. Coronary artery calcification seen on CAT scan   ?5. Aortic atherosclerosis ([place])   ?6. Mixed hyperlipidemia   ? ?PLAN:   ? ?In order of problems listed above: ? ?Clinically I think what she has is bicuspid aortic valve ascending aortic aneurysm syndrome asymptomatic aneurysm has not progressed and we will recheck echocardiogram for aortic regurgitation.  She will continue endocarditis prophylaxis ?She will trend record blood pressures at home and if systolics greater than 539 will need additional treatment ?Continue her statin with vascular asked atherosclerosis. ? ? ?Next appointment: 6 months. ? ? ?Medication Adjustments/Labs and Tests Ordered: ?Current medicines are reviewed at length with the patient today.  Concerns regarding medicines are outlined above.  ?No orders of the defined types were placed in this encounter. ? ?No orders of the defined types were placed in this encounter. ? ? ?Chief Complaint  ?Patient presents with  ? Follow-up  ?  Thoracic aortic aneurysm and aortic regurgitation  ? ? ?History of Present Illness:   ? ?[name] [name] is a 66 y.o. female with a hx of  colon cancer age 16 treated with surgery chemotherapy and radiation hypertension and dyslipidemia last seen by Wynonia Lawman.  Echocardiogram 2017 showed normal aorta and aortic valve moderate regurgitation normal left ventricular size function EF estimated at 60%.  She was last seen 03/19/2020.  Echocardiogram performed after that visit showed normal left ventricular size mild LVH normal left ventricular systolic function and grade 1 diastolic  dysfunction.  There was concern the aortic valve was possibly bicuspid and there was moderate aortic regurgitation.  The ascending aorta was aneurysmal measuring 44 mm. Follow-up CTA thoracic aorta performed 01/02/2020 showed aneurysm proximal ascending aorta measuring 46 mm unchanged from November 2020.  She also had coronary artery calcification and calcification of the thoracic aorta. There is hepatic steatosis and a question of early changes of liver cirrhosis. She was last seen 01/19/2021. ?CTA 01/19/2021: ?IMPRESSION: ?1. No significant change in the previously demonstrated ascending ?thoracic aortic aneurysm, currently measuring 4.5 cm in maximum ?diameter. Recommend semi-annual imaging followup by CTA or MRA and ?referral to cardiothoracic surgery if not already obtained. This ?recommendation follows 2010 ? ?Compliance with diet, lifestyle and medications: Yes ? ?She was advised to switch from lisinopril hydrochlorothiazide to a higher dose of lisinopril due to low sodium and elevated calcium I have asked her to do this ?Her home blood pressures run in the range of 1 20-1 30 and working to trend those numbers if systolics are consistently greater than 140 we can either place on calcium channel blocker or distal diuretic ? ?Her thoracic aortic aneurysm has not progressed she has had no chest pain edema shortness of breath palpitation or syncope ?We will renew her antibiotic for endocarditis prophylaxis ?Past Medical History:  ?Diagnosis Date  ? Anemia   ? hx of, not at this time  ? Anxiety   ? no meds  ? Aortic regurgitation   ? Arthritis   ? "right hip; left hip; most of my joints" (05/27/2014)  ? Blood transfusion  without reported diagnosis   ? Phreesia 09/04/2020  ? Cancer [facility])   ? Phreesia 09/04/2020  ? Depression   ? Diverticulosis   ? Essential hypertension, benign   ? takes Lisinopril-HCTZ daily  ? Heart murmur   ? High cholesterol   ? takes Pravastatin daily  ? History of blood transfusion   ? no  abnormal reaction noted 2015  ? History of colon polyps   ? benign  ? History of colonic polyps 05/21/2021  ? Hyperlipidemia   ? Phreesia 09/04/2020  ? Hypertension   ? Phreesia 09/04/2020  ? Insomnia   ? doesn't take any meds  ? Joint pain   ? 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 Neuromuscular disorder ([place])   ? Neuropathy   ? since chemo and radiation  ? Nocturia   ? PONV (postoperative nausea and vomiting)   ? hallucinations  ? Rectosigmoid cancer ([place]) 2004   ? S/P Surgery, Chemo, Rad Tx,  (after chemo treatment menstral periods have ceased).  ? ? ?Past Surgical History:  ?Procedure Laterality Date  ? ANAL RECTAL MANOMETRY N/A 07/17/2017  ? Procedure: ANO RECTAL MANOMETRY;  Surgeon: Mauri Pole, MD;  [facility];  Service: Endoscopy;  Laterality: N/A;  ? COLECTOMY  Aug 2004  ? S/P Low Anterior Resxn (Sigmoid Colon)  ? COLON SURGERY N/A   ? Phreesia 09/04/2020  ? COLONOSCOPY    ? DILATION AND CURETTAGE OF UTERUS  1991  ? JOINT REPLACEMENT N/A   ? Phreesia 09/04/2020  ? [place] SURGERY  Feb 2007  ? SPINE SURGERY N/A   ? Phreesia 09/04/2020  ? TOTAL HIP ARTHROPLASTY Right 05/27/2014  ? Procedure: RIGHT TOTAL HIP ARTHROPLASTY ANTERIOR APPROACH;  Surgeon: Hessie Dibble, MD;  Location: [facility];  Service: Orthopedics;  Laterality: Right;  ? TOTAL HIP ARTHROPLASTY Left 08/25/2015  ? Procedure: TOTAL HIP ARTHROPLASTY ANTERIOR APPROACH;  Surgeon: Melrose Nakayama, MD;  Location: [facility];  Service: Orthopedics;  Laterality: Left;  ? ? ?Current Medications: ?Current Meds  ?Medication Sig  ? Artificial Tear Ointment (DRY EYES OP) Apply to eye as needed.  ? gabapentin (NEURONTIN) 400 MG capsule Take 400 mg by mouth daily.  ? LINZESS 72 MCG capsule TAKE 1 CAPSULE BY MOUTH EVERY DAY BEFORE BREAKFAST  ? lisinopril (ZESTRIL) 40 MG tablet Take 1 tablet (40 mg total) by mouth daily.  ? meloxicam (MOBIC) 15 MG tablet Take 1 tablet (15 mg total) by mouth daily.  ? Multiple Vitamins-Minerals (MULTIVITAMIN  ADULT PO) Take by mouth daily.  ? simvastatin (ZOCOR) 40 MG tablet PLEASE SEE ATTACHED FOR DETAILED DIRECTIONS  ?  ? ?Allergies:   Patient has no known allergies.  ? ?Social History  ? ?Socioeconomic History  ? Marital status: Married  ?  Spouse name: Nicole Kindred  ? Number of children: 1  ? Years of education: Not on file  ? Highest education level: Not on file  ?Occupational History  ? Occupation: office work  ?Tobacco Use  ? Smoking status: Former  ?  Types: Cigarettes  ?  Passive exposure: Past  ? Smokeless tobacco: Never  ? Tobacco comments:  ?  "smoked some socially in my college days"  ?Vaping Use  ? Vaping Use: Never used  ?Substance and Sexual Activity  ? Alcohol use: Yes  ?  Comment: occasionally beer/wine  ? Drug use: No  ? Sexual activity: Yes  ?  Partners: Male  ?  Birth control/protection: None  ?Other Topics Concern  ?  Not on file  ?Social History Narrative  ? Lives with husband  ? Exercise walking 2 x weekly  ? Patient's family history -  UNKNOWN ADOPTED  ? ?Social Determinants of Health  ? ?Financial Resource Strain: Not on file  ?Food Insecurity: Not on file  ?Transportation Needs: Not on file  ?Physical Activity: Not on file  ?Stress: Not on file  ?Social Connections: Not on file  ?  ? ?Family History: ?The patient's family history is not on file. She was adopted. ?ROS:   ?Please see the history of present illness.    ?All other systems reviewed and are negative. ? ?EKGs/Labs/Other Studies Reviewed:   ? ?The following studies were reviewed today: ? ?EKG:  EKG ordered today and personally reviewed.  The ekg ordered today demonstrates sinus rhythm normal EKG ? ?Recent Labs: ?10/12/2021: ALT 23; BUN 9; Creatinine, Ser 0.66; Hemoglobin 13.7; Platelets 165.0; Potassium 4.1; Sodium 131  ?Recent Lipid Panel ?   ?Component Value Date/Time  ? CHOL 167 10/12/2021 1327  ? CHOL 164 01/14/2021 1356  ? TRIG 140.0 10/12/2021 1327  ? HDL 68.00 10/12/2021 1327  ? HDL 68 01/14/2021 1356  ? CHOLHDL 2 10/12/2021 1327  ? VLDL  28.0 10/12/2021 1327  ? [place] 71 10/12/2021 1327  ? [place] 67 01/14/2021 1356  ? ? ?Physical Exam:   ? ?VS:  BP (!) 166/76 (BP Location: Right Arm)   Pulse 83   Ht '5\' 3"'$  (1.6 m)   Wt 148 lb (67.1 kg)

## 2021-10-28 NOTE — Addendum Note (Signed)
Addended by: Edwyna Shell I on: 10/28/2021 09:25 AM ? ? Modules accepted: Orders ? ?

## 2021-11-08 ENCOUNTER — Other Ambulatory Visit: Payer: Self-pay | Admitting: Cardiology

## 2021-11-16 ENCOUNTER — Ambulatory Visit (HOSPITAL_BASED_OUTPATIENT_CLINIC_OR_DEPARTMENT_OTHER)
Admission: RE | Admit: 2021-11-16 | Discharge: 2021-11-16 | Disposition: A | Discharge status: Home / Self Care | Payer: 59 | Source: Ambulatory Visit | Attending: Cardiology | Admitting: Cardiology

## 2021-11-16 DIAGNOSIS — I7121 Aneurysm of the ascending aorta, without rupture: Secondary | ICD-10-CM | POA: Diagnosis present

## 2021-11-16 DIAGNOSIS — E782 Mixed hyperlipidemia: Secondary | ICD-10-CM | POA: Insufficient documentation

## 2021-11-16 DIAGNOSIS — I10 Essential (primary) hypertension: Secondary | ICD-10-CM | POA: Diagnosis present

## 2021-11-16 DIAGNOSIS — I351 Nonrheumatic aortic (valve) insufficiency: Secondary | ICD-10-CM | POA: Insufficient documentation

## 2021-11-16 DIAGNOSIS — I2510 Atherosclerotic heart disease of native coronary artery without angina pectoris: Secondary | ICD-10-CM | POA: Insufficient documentation

## 2021-11-16 DIAGNOSIS — I700 Atherosclerosis of aorta: Secondary | ICD-10-CM | POA: Insufficient documentation

## 2021-11-16 LAB — ECHOCARDIOGRAM COMPLETE
Area-P 1/2: 3.91 cm2
P 1/2 time: 225 msec
S' Lateral: 3 cm

## 2021-11-16 NOTE — Progress Notes (Signed)
?  Echocardiogram ?2D Echocardiogram has been performed. ? ?[name] [name] ?11/16/2021, 3:11 PM ?

## 2021-11-19 ENCOUNTER — Ambulatory Visit: Payer: 59 | Admitting: Gastroenterology

## 2021-11-19 ENCOUNTER — Encounter: Payer: Self-pay | Admitting: Gastroenterology

## 2021-11-19 VITALS — BP 128/70 | HR 66 | Ht 63.0 in | Wt 149.0 lb

## 2021-11-19 DIAGNOSIS — Z86010 Personal history of colonic polyps: Secondary | ICD-10-CM | POA: Diagnosis not present

## 2021-11-19 DIAGNOSIS — K760 Fatty (change of) liver, not elsewhere classified: Secondary | ICD-10-CM

## 2021-11-19 DIAGNOSIS — R109 Unspecified abdominal pain: Secondary | ICD-10-CM

## 2021-11-19 DIAGNOSIS — K582 Mixed irritable bowel syndrome: Secondary | ICD-10-CM

## 2021-11-19 DIAGNOSIS — Z85038 Personal history of other malignant neoplasm of large intestine: Secondary | ICD-10-CM

## 2021-11-19 DIAGNOSIS — Z9049 Acquired absence of other specified parts of digestive tract: Secondary | ICD-10-CM

## 2021-11-19 MED ORDER — DICYCLOMINE HCL 10 MG PO CAPS: 10.0000 mg | ORAL_CAPSULE | Freq: Three times a day (TID) | ORAL | 3 refills | Status: DC | PRN

## 2021-11-19 MED ORDER — LINACLOTIDE 145 MCG PO CAPS
145.0000 ug | ORAL_CAPSULE | Freq: Every day | ORAL | 6 refills | Status: DC
Start: 2021-11-19 — End: 2022-09-07

## 2021-11-19 MED ORDER — COLESEVELAM HCL 625 MG PO TABS: 625.0000 mg | ORAL_TABLET | Freq: Three times a day (TID) | ORAL | 3 refills | Status: DC | PRN

## 2021-11-19 NOTE — Patient Instructions (Signed)
We have sent the following medications to your pharmacy for you to pick up at your convenience:Linzess, Welchol and Bentyl.  ? ?The [facility] providers would like to encourage you to use [facility] to communicate with providers for non-urgent requests or questions.  Due to long hold times on the telephone, sending your provider a message by [facility] may be a faster and more efficient way to get a response.  Please allow 48 business hours for a response.  Please remember that this is for non-urgent requests.  ? ? ? ?

## 2021-11-19 NOTE — Progress Notes (Signed)
[name] [name]    [id number]    May 20, 1956  Primary Care Physician:Sagardia, Ines Bloomer, MD  Referring Physician: Horald Pollen, MD [place],  [place] [id number]   Chief complaint:  Constipation alternating diarrhea, IBS  HPI:  66 year old female with history of colon cancer status post resection here for follow-up visit for fatty liver, constipation alternating with diarrhea She goes back and forth between diarrhea and constipation, continues to have persistent symptoms of incomplete evacuation and fragmentation of stool despite taking Linzess.  On average she has 3-4 bowel movements in a day before she feels she completely evacuated.  Denies any blood per rectum, melena, vomiting, decreased appetite or weight loss.  She did not feel pelvic floor physical therapy helped much, the sessions were spaced out so she felt there was not much continuity    Anorectal manometry July 17, 2017 showed weak anal sphincter and dyssynergic defecation She subsequently did pelvic floor physical therapy with no significant improvement of symptoms   Colonoscopy March 29, 2018: 3 polyps (tubular adenoma and sessile serrated polyp) 5 to 11 mm in size removed from descending and transverse colon with cold snare.  2 polyps (tubular adenoma ) less than 5 mm in size removed with forceps.  Left-sided diverticulosis.  Functional colocolonic anastomosis.   Colonoscopy April 25, 2019: - Mild diverticulosis in the descending colon. - Non-bleeding internal hemorrhoids. - Patent functional end-to-end colo-colonic anastomosis, characterized by healthy appearing mucosa. - No specimens     Outpatient Encounter Medications as of 11/19/2021  Medication Sig   amoxicillin (AMOXIL) 500 MG capsule Take 1 capsule (500 mg total) by mouth daily. Please take 45 minutes pre-dental procedure.   Artificial Tear Ointment (DRY EYES OP) Apply to eye as needed.   gabapentin (NEURONTIN) 400  MG capsule Take 400 mg by mouth daily.   LINZESS 72 MCG capsule TAKE 1 CAPSULE BY MOUTH EVERY DAY BEFORE BREAKFAST   lisinopril (ZESTRIL) 40 MG tablet Take 1 tablet (40 mg total) by mouth daily.   meloxicam (MOBIC) 15 MG tablet Take 1 tablet (15 mg total) by mouth daily.   Multiple Vitamins-Minerals (MULTIVITAMIN ADULT PO) Take by mouth daily.   simvastatin (ZOCOR) 40 MG tablet PLEASE SEE ATTACHED FOR DETAILED DIRECTIONS   No facility-administered encounter medications on file as of 11/19/2021.    Allergies as of 11/19/2021   (No Known Allergies)    Past Medical History:  Diagnosis Date   Anemia    hx of, not at this time   Anxiety    no meds   Aortic regurgitation    Arthritis    "right hip; left hip; most of my joints" (05/27/2014)   Blood transfusion without reported diagnosis    Phreesia 09/04/2020   Cancer ([place])    Phreesia 09/04/2020   Depression    Diverticulosis    Essential hypertension, benign    takes Lisinopril-HCTZ daily   Heart murmur    High cholesterol    takes Pravastatin daily   History of blood transfusion    no abnormal reaction noted 2015   History of colon polyps    benign   History of colonic polyps 05/21/2021   Hyperlipidemia    Phreesia 09/04/2020   Hypertension    Phreesia 09/04/2020   Insomnia    doesn't take any meds   Joint pain    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    Past Surgical History:  Procedure Laterality Date   ANAL RECTAL MANOMETRY N/A 07/17/2017   Procedure: ANO RECTAL MANOMETRY;  Surgeon: Mauri Pole, MD;  Location: [facility];  Service: Endoscopy;  Laterality: N/A;   COLECTOMY  Aug 2004   S/P Low Anterior Resxn (Sigmoid Colon)   COLON  SURGERY N/A    Phreesia 09/04/2020   COLONOSCOPY     DILATION AND CURETTAGE OF UTERUS  1991   JOINT REPLACEMENT N/A    Phreesia 09/04/2020   LUMBAR [place] SURGERY  Feb 2007   SPINE SURGERY N/A    Phreesia 09/04/2020   TOTAL HIP ARTHROPLASTY Right 05/27/2014   Procedure: RIGHT TOTAL HIP ARTHROPLASTY ANTERIOR APPROACH;  Surgeon: Hessie Dibble, MD;  Location: [facility];  Service: Orthopedics;  Laterality: Right;   TOTAL HIP ARTHROPLASTY Left 08/25/2015   Procedure: TOTAL HIP ARTHROPLASTY ANTERIOR APPROACH;  Surgeon: Melrose Nakayama, MD;  Location: [facility];  Service: Orthopedics;  Laterality: Left;    Family History  Adopted: Yes    Social History   Socioeconomic History   Marital status: Married    Spouse name: Nicole Kindred   Number of children: 1   Years of education: Not on file   Highest education level: Not on file  Occupational History   Occupation: office work  Tobacco Use   Smoking status: Former    Types: Cigarettes    Passive exposure: Past   Smokeless tobacco: Never   Tobacco comments:    "smoked some socially in my college days"  Vaping Use   Vaping Use: Never used  Substance and Sexual Activity   Alcohol use: Yes    Comment: occasionally beer/wine   Drug use: No   Sexual activity: Yes    Partners: Male    Birth control/protection: None  Other Topics Concern   Not on file  Social History Narrative   Lives with husband   Exercise walking 2 x weekly   Patient's family history -  UNKNOWN ADOPTED   Social Determinants of Health   Financial Resource Strain: Not on file  Food Insecurity: Not on file  Transportation Needs: Not on file  Physical Activity: Not on file  Stress: Not on file  Social Connections: Not on file  Intimate Partner Violence: Not on file      Review of systems: All other review of systems negative except as mentioned in the HPI.   Physical Exam: Vitals:   11/19/21 0850  BP: 128/70  Pulse: 66   Body mass index is 26.39 kg/m. Gen:       No acute distress HEENT:  sclera anicteric Abd:      soft, non-tender; no palpable masses, no distension Ext:    No edema Neuro: alert and oriented x 3 Psych: normal mood and affect  Data Reviewed:  Reviewed labs, radiology imaging, old records and pertinent past GI work up   Assessment and Plan/Recommendations:  66 year old very pleasant female with history of colon cancer s/p resection with IBS, alternating diarrhea and constipation secondary to outlet dysfunction and dyssynergic defecation   Use Linzess 145 mcg daily.  Advised patient to hold Linzess if she has multiple bowel movements Use WelChol as needed, likely has a component of  bile salt induced diarrhea in addition to overflow diarrhea.  She is s/p right colon resection Advised patient to increase Benefiber to 2 tablets 3 times daily with meals Increase water intake to 8 to 10 cups daily  Use Bentyl 10 mg up to 3 times daily as needed for abdominal discomfort and cramping   She is up-to-date with colorectal cancer surveillance, she has personal history of colon cancer and multiple adenomatous colon polyps.  Genetic testing negative for Lynch syndrome  Fatty liver on CT: Continue with dietary changes and exercise    Return in 6 months or sooner if needed   The patient was provided an opportunity to ask questions and all were answered. The patient agreed with the plan and demonstrated an understanding of the instructions.  Damaris Hippo , MD    CC: [place], [place]

## 2021-11-20 ENCOUNTER — Other Ambulatory Visit: Payer: Self-pay | Admitting: Cardiology

## 2021-11-29 ENCOUNTER — Encounter: Payer: Self-pay | Admitting: Gastroenterology

## 2022-01-04 ENCOUNTER — Ambulatory Visit: Payer: 59 | Admitting: Internal Medicine

## 2022-01-04 ENCOUNTER — Encounter: Payer: Self-pay | Admitting: Internal Medicine

## 2022-01-04 VITALS — BP 120/70 | HR 71 | Ht 63.0 in | Wt 149.0 lb

## 2022-01-04 DIAGNOSIS — Z1589 Genetic susceptibility to other disease: Secondary | ICD-10-CM | POA: Diagnosis not present

## 2022-01-04 DIAGNOSIS — E871 Hypo-osmolality and hyponatremia: Secondary | ICD-10-CM | POA: Diagnosis not present

## 2022-01-04 DIAGNOSIS — R1900 Intra-abdominal and pelvic swelling, mass and lump, unspecified site: Secondary | ICD-10-CM

## 2022-01-04 LAB — CBC
HCT: 40.4 % (ref 36.0–46.0)
Hemoglobin: 13.7 g/dL (ref 12.0–15.0)
MCHC: 33.8 g/dL (ref 30.0–36.0)
MCV: 90.8 fl (ref 78.0–100.0)
Platelets: 160 10*3/uL (ref 150.0–400.0)
RBC: 4.45 Mil/uL (ref 3.87–5.11)
RDW: 12.5 % (ref 11.5–15.5)
WBC: 5.2 10*3/uL (ref 4.0–10.5)

## 2022-01-04 LAB — BASIC METABOLIC PANEL
BUN: 11 mg/dL (ref 6–23)
CO2: 26 mEq/L (ref 19–32)
Calcium: 9.3 mg/dL (ref 8.4–10.5)
Chloride: 101 mEq/L (ref 96–112)
Creatinine, Ser: 0.71 mg/dL (ref 0.40–1.20)
GFR: 89.25 mL/min (ref >60.00)
Glucose, Bld: 123 mg/dL — ABNORMAL HIGH (ref 70–99)
Potassium: 4.4 mEq/L (ref 3.5–5.1)
Sodium: 134 mEq/L — ABNORMAL LOW (ref 135–145)

## 2022-01-04 NOTE — Progress Notes (Signed)
Name: [name] [name]  MRN/ DOB: [id number], Nov 15, 1955    Age/ Sex: 66 y.o., female    PCP: Horald Pollen, MD   Reason for Endocrinology Evaluation: [facility] Mutation      Date of Initial Endocrinology Evaluation: 01/04/2022     HPI: [name] [name] is a 66 y.o. female with a past medical history of HTN and Dyslipidemia . The patient presented for initial endocrinology clinic visit on 01/04/2022 for consultative assistance with her SDHA mutation .   During genetic testing in 05/2021  due to personal hx of colon cancer she was noted to have a single pathognomic variant of SDHA mutation   She follows with cardiology for aortic aneurysm   Substantial weight gain- no Severe hypertension- no Sudden/ severe headaches- yes that she attributes to neck pains Anxiety attacks-  Cardiac arrhythmias- no Palpitations- no Fluid retention- yes  Has had rare  dizzy spells   but no episodes of clammy   Has hip and neck OA   She is adopted   HISTORY:  Past Medical History:  Past Medical History:  Diagnosis Date  . Anemia    hx of, not at this time  . Anxiety    no meds  . Aortic regurgitation   . Arthritis    "right hip; left hip; most of my joints" (05/27/2014)  . Blood transfusion without reported diagnosis    Phreesia 09/04/2020  . Cancer ([place])    Phreesia 09/04/2020  . Depression   . Diverticulosis   . Essential hypertension, benign    takes Lisinopril-HCTZ daily  . Heart murmur   . High cholesterol    takes Pravastatin daily  . History of blood transfusion    no abnormal reaction noted 2015  . History of colon polyps    benign  . History of colonic polyps 05/21/2021  . Hyperlipidemia    Phreesia 09/04/2020  . Hypertension    Phreesia 09/04/2020  . Insomnia    doesn't take any meds  . Joint pain   . 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 Neuromuscular disorder ([place])   . Neuropathy    since chemo and radiation  . Nocturia   . PONV  (postoperative nausea and vomiting)    hallucinations  . Rectosigmoid cancer ([place]) 2004    S/P Surgery, Chemo, Rad Tx,  (after chemo treatment menstral periods have ceased).   Past Surgical History:  Past Surgical History:  Procedure Laterality Date  . ANAL RECTAL MANOMETRY N/A 07/17/2017   Procedure: ANO RECTAL MANOMETRY;  Surgeon: Mauri Pole, MD;  [facility];  Service: Endoscopy;  Laterality: N/A;  . COLECTOMY  Aug 2004   S/P Low Anterior Resxn (Sigmoid Colon)  . COLON SURGERY N/A    Phreesia 09/04/2020  . COLONOSCOPY    . DILATION AND CURETTAGE OF UTERUS  1991  . JOINT REPLACEMENT N/A    Phreesia 09/04/2020  . LUMBAR [place] SURGERY  Feb 2007  . SPINE SURGERY N/A    Phreesia 09/04/2020  . TOTAL HIP ARTHROPLASTY Right 05/27/2014   Procedure: RIGHT TOTAL HIP ARTHROPLASTY ANTERIOR APPROACH;  Surgeon: Hessie Dibble, MD;  Location: [facility];  Service: Orthopedics;  Laterality: Right;  . TOTAL HIP ARTHROPLASTY Left 08/25/2015   Procedure: TOTAL HIP ARTHROPLASTY ANTERIOR APPROACH;  Surgeon: Melrose Nakayama, MD;  Location: [facility];  Service: Orthopedics;  Laterality: Left;    Social History:  reports that she has quit smoking. Her smoking use included cigarettes.  She has been exposed to tobacco smoke. She has never used smokeless tobacco. She reports current alcohol use. She reports that she does not use drugs. Family History: family history is not on file. She was adopted.   HOME MEDICATIONS: Allergies as of 01/04/2022   No Known Allergies      Medication List        Accurate as of January 04, 2022  7:00 AM. If you have any questions, ask your nurse or doctor.          amoxicillin 500 MG capsule Commonly known as: AMOXIL TAKE 1 CAPSULE BY MOUTH DAILY. PLEASE TAKE 45 MINUTES PRE-DENTAL PROCEDURE.   colesevelam 625 MG tablet Commonly known as: Welchol Take 1 tablet (625 mg total) by mouth 3 (three) times daily as needed.   dicyclomine 10 MG capsule Commonly  known as: BENTYL Take 1 capsule (10 mg total) by mouth 3 (three) times daily as needed for spasms.   DRY EYES OP Apply to eye as needed.   gabapentin 400 MG capsule Commonly known as: NEURONTIN Take 400 mg by mouth daily.   linaclotide 145 MCG Caps capsule Commonly known as: Linzess Take 1 capsule (145 mcg total) by mouth daily before breakfast.   lisinopril 40 MG tablet Commonly known as: ZESTRIL Take 1 tablet (40 mg total) by mouth daily.   meloxicam 15 MG tablet Commonly known as: MOBIC Take 1 tablet (15 mg total) by mouth daily.   MULTIVITAMIN ADULT PO Take by mouth daily.   simvastatin 40 MG tablet Commonly known as: ZOCOR PLEASE SEE ATTACHED FOR DETAILED DIRECTIONS          REVIEW OF SYSTEMS: A comprehensive ROS was conducted with the patient and is negative except as per HPI    OBJECTIVE:  VS:BP 120/70 (BP Location: Left Arm, Patient Position: Sitting, Cuff Size: Small)   Pulse 71   Ht '5\' 3"'$  (1.6 m)   Wt 149 lb (67.6 kg)   SpO2 99%   BMI 26.39 kg/m    Wt Readings from Last 3 Encounters:  11/19/21 149 lb (67.6 kg)  10/28/21 148 lb (67.1 kg)  10/12/21 148 lb 8 oz (67.4 kg)     EXAM: General: Pt appears well and is in NAD  Neck: General: Supple without adenopathy. Thyroid: Thyroid size normal.  No goiter or nodules appreciated.  Lungs: Clear with good BS bilat with no rales, rhonchi, or wheezes  Heart: Auscultation: RRR.  Abdomen: Normoactive bowel sounds, soft, nontender, without masses or organomegaly palpable  Extremities:  BL LE: Trace  pretibial edema   Skin: Hair: Texture and amount normal with gender appropriate distribution Skin Inspection: No rashes, acanthosis nigricans/skin tags. Skin Palpation: Skin temperature, texture, and thickness normal to palpation  Mental Status: Judgment, insight: Intact Orientation: Oriented to time, place, and person Mood and affect: No depression, anxiety, or agitation     DATA REVIEWED:     Latest  Reference Range & Units 01/04/22 12:20  Sodium 135 - 145 mEq/L 134 (L)  Potassium 3.5 - 5.1 mEq/L 4.4  Chloride 96 - 112 mEq/L 101  CO2 19 - 32 mEq/L 26  Glucose 70 - 99 mg/dL 123 (H)  BUN 6 - 23 mg/dL 11  Creatinine 0.40 - 1.20 mg/dL 0.71  Calcium 8.4 - 10.5 mg/dL 9.3  GFR >60.00 mL/min 89.25    ASSESSMENT/PLAN/RECOMMENDATIONS:   SDHA Mutation:  -Patient with a single pathognomonic variant identified in [place].  SDHA is associated with autosomal dominant hereditary paraganglioma-pheochromocytoma syndrome,  gastrointestinal stromal tumors, and autosomal dominant and autosomal recessive mitochondrial complex II deficiency - Estimated lifetime risk of paragangliomas and pheochromocytoma is 1-10%, but the penetrance is variable -At this time there is no clinical suspicion for pheochromocytoma or paraganglioma - Pt will need annual biochemical testing  - Will need  123 I-MIBG scintigraphy or an FDG-PET in the future, and this may be done every 5 years - MRI/CT imaging every 2-3 years  -Additional imaging for chest is not recommended -There is also high risk for developing renal cancer this will be monitored during abdominal MRIs for paraganglioma/pheochromocytoma -She has a son and she understands that he will need to be tested    2. Hyponatremia:  -This is chronic and intermittent since 2012. -She is asymptomatic -Hyponatremia is due to low effective arterial blood volume which is evidenced by peripheral edema, the cause of this is unclear to me. Low effective arterial blood volume will cause release of ADH.  -She does admit to constantly drinking water as well as tea -I have encouraged her to drink to thirst only -Sodium today is improving   F/U in 1 yr    Signed electronically by: Mack Guise, MD  [facility]  [facility] [address] [place], [place] [address] Phone: [phone] FAX: [phone]   CC: Horald Pollen, MD [place] [address] Phone: [phone] Fax: [phone]   Return to Endocrinology clinic as below: Future Appointments  Date Time Provider [place]  01/04/2022 11:10 AM Neely Kammerer, Melanie Crazier, MD [facility] None

## 2022-01-05 DIAGNOSIS — E871 Hypo-osmolality and hyponatremia: Secondary | ICD-10-CM | POA: Insufficient documentation

## 2022-01-10 ENCOUNTER — Encounter: Payer: Self-pay | Admitting: Internal Medicine

## 2022-01-17 ENCOUNTER — Other Ambulatory Visit: Payer: Self-pay | Admitting: Cardiology

## 2022-01-26 LAB — METANEPHRINES, PLASMA
Metanephrine, Free: 34 pg/mL (ref <=57)
Normetanephrine, Free: 67 pg/mL (ref <=148)
Total Metanephrines-Plasma: 101 pg/mL (ref <=205)

## 2022-01-26 LAB — CATECHOLAMINES, FRACTIONATED, PLASMA
Dopamine: 12 pg/mL
Epinephrine: <20 pg/mL
Norepinephrine: 366 pg/mL
Total Catecholamines: 378 pg/mL

## 2022-03-04 ENCOUNTER — Other Ambulatory Visit: Payer: Self-pay | Admitting: Gastroenterology

## 2022-03-19 ENCOUNTER — Other Ambulatory Visit: Payer: Self-pay | Admitting: Cardiology

## 2022-04-20 ENCOUNTER — Ambulatory Visit (AMBULATORY_SURGERY_CENTER): Payer: Self-pay

## 2022-04-20 VITALS — Ht 63.0 in | Wt 147.0 lb

## 2022-04-20 DIAGNOSIS — Z86010 Personal history of colonic polyps: Secondary | ICD-10-CM

## 2022-04-20 MED ORDER — NA SULFATE-K SULFATE-MG SULF 17.5-3.13-1.6 GM/177ML PO SOLN: 1.0000 | Freq: Once | ORAL | 0 refills | Status: AC

## 2022-04-20 NOTE — Progress Notes (Signed)
No egg or soy allergy known to patient  No issues known to pt with past sedation with any surgeries or procedures Patient denies ever being told they had issues or difficulty with intubation  No FH of Malignant Hyperthermia Pt is not on diet pills Pt is not on  home 02  Pt is not on blood thinners  Pt denies issues with constipation  No A fib or A flutter Have any cardiac testing pending--no Pt instructed to use GoodRx coupon given to her for a price reduction on prep

## 2022-05-03 ENCOUNTER — Encounter: Payer: Self-pay | Admitting: Gastroenterology

## 2022-05-12 ENCOUNTER — Ambulatory Visit (AMBULATORY_SURGERY_CENTER): Payer: 59 | Admitting: Gastroenterology

## 2022-05-12 ENCOUNTER — Encounter: Payer: Self-pay | Admitting: Gastroenterology

## 2022-05-12 VITALS — BP 101/48 | HR 69 | Temp 96.0°F | Resp 13 | Ht 63.0 in | Wt 147.0 lb

## 2022-05-12 DIAGNOSIS — Z85038 Personal history of other malignant neoplasm of large intestine: Secondary | ICD-10-CM

## 2022-05-12 DIAGNOSIS — Z86010 Personal history of colonic polyps: Secondary | ICD-10-CM

## 2022-05-12 DIAGNOSIS — Z08 Encounter for follow-up examination after completed treatment for malignant neoplasm: Secondary | ICD-10-CM | POA: Diagnosis present

## 2022-05-12 DIAGNOSIS — Z09 Encounter for follow-up examination after completed treatment for conditions other than malignant neoplasm: Secondary | ICD-10-CM | POA: Diagnosis not present

## 2022-05-12 DIAGNOSIS — D122 Benign neoplasm of ascending colon: Secondary | ICD-10-CM | POA: Diagnosis not present

## 2022-05-12 MED ORDER — SODIUM CHLORIDE 0.9 % IV SOLN: 500.0000 mL | Freq: Once | INTRAVENOUS | Status: AC

## 2022-05-12 NOTE — Progress Notes (Signed)
Sedate, gd SR, tolerated procedure well, VSS, report to RN 

## 2022-05-12 NOTE — Op Note (Signed)
[facility] Patient Name: [name] [name] Procedure Date: 05/12/2022 9:16 AM MRN: [id number] Endoscopist: Mauri Pole , MD, [id number] Age: 66 Referring MD:  Date of Birth: 1955-11-22 Gender: Female Account #: [id number] Procedure:                Colonoscopy Indications:              High risk colon cancer surveillance: Personal                            history of adenoma (10 mm or greater in size), High                            risk colon cancer surveillance: Personal history of                            multiple (3 or more) adenomas, High risk colon                            cancer surveillance: Personal history of colon                            cancer Medicines:                Monitored Anesthesia Care Procedure:                Pre-Anesthesia Assessment:                           - Prior to the procedure, a History and Physical                            was performed, and patient medications and                            allergies were reviewed. The patient's tolerance of                            previous anesthesia was also reviewed. The risks                            and benefits of the procedure and the sedation                            options and risks were discussed with the patient.                            All questions were answered, and informed consent                            was obtained. Prior Anticoagulants: The patient has                            taken no anticoagulant or antiplatelet agents. ASA  Grade Assessment: II - A patient with mild systemic                            disease. After reviewing the risks and benefits,                            the patient was deemed in satisfactory condition to                            undergo the procedure.                           After obtaining informed consent, the colonoscope                            was passed under direct vision. Throughout the                             procedure, the patient's blood pressure, pulse, and                            oxygen saturations were monitored continuously. The                            PCF-HQ190L Colonoscope was introduced through the                            anus and advanced to the the cecum, identified by                            appendiceal orifice and ileocecal valve. The                            colonoscopy was performed without difficulty. The                            patient tolerated the procedure well. The quality                            of the bowel preparation was excellent. The                            ileocecal valve, appendiceal orifice, and rectum                            were photographed. Scope In: 9:32:10 AM Scope Out: 9:43:30 AM Scope Withdrawal Time: 0 hours 9 minutes 16 seconds  Total Procedure Duration: 0 hours 11 minutes 20 seconds  Findings:                 The perianal and digital rectal examinations were                            normal.  A 3 mm polyp was found in the ascending colon. The                            polyp was sessile. The polyp was removed with a                            cold snare. Resection and retrieval were complete.                           Scattered small-mouthed diverticula were found in                            the sigmoid colon.                           There was evidence of a prior end-to-end                            colo-colonic anastomosis in the recto-sigmoid                            colon. This was patent and was characterized by                            healthy appearing mucosa.                           Non-bleeding external and internal hemorrhoids were                            found during retroflexion. The hemorrhoids were                            small. Complications:            No immediate complications. Estimated Blood Loss:     Estimated blood loss was  minimal. Impression:               - One 3 mm polyp in the ascending colon, removed                            with a cold snare. Resected and retrieved.                           - Diverticulosis in the sigmoid colon.                           - Patent end-to-end colo-colonic anastomosis,                            characterized by healthy appearing mucosa.                           - Non-bleeding external and internal hemorrhoids. Recommendation:           - Patient has a contact number available for  emergencies. The signs and symptoms of potential                            delayed complications were discussed with the                            patient. Return to normal activities tomorrow.                            Written discharge instructions were provided to the                            patient.                           - Resume previous diet.                           - Continue present medications.                           - Await pathology results.                           - Repeat colonoscopy in 5 years for surveillance                            based on pathology results. Mauri Pole, MD 05/12/2022 9:57:44 AM This report has been signed electronically.

## 2022-05-12 NOTE — Progress Notes (Signed)
[facility] History and Physical   Primary Care Physician:  Horald Pollen, MD   Reason for Procedure:  History of adenomatous colon polyps, h/ colon cancer  Plan:    Surveillance colonoscopy with possible interventions as needed     HPI: [name] [name] is a very pleasant 66 y.o. female here for surveillance colonoscopy. Denies any nausea, vomiting, abdominal pain, melena or bright red blood per rectum  The risks and benefits as well as alternatives of endoscopic procedure(s) have been discussed and reviewed. All questions answered. The patient agrees to proceed.    Past Medical History:  Diagnosis Date   Anemia    hx of, not at this time   Aortic regurgitation    Arthritis    "right hip; left hip; most of my joints" (05/27/2014)   Blood transfusion without reported diagnosis    Phreesia 09/04/2020   Cancer ([place])    Phreesia 09/04/2020   Depression    Diverticulosis    Essential hypertension, benign    takes Lisinopril-HCTZ daily   Heart murmur    High cholesterol    takes Pravastatin daily   History of blood transfusion    no abnormal reaction noted 2015   History of colon polyps    benign   History of colonic polyps 05/21/2021   Hyperlipidemia    Phreesia 09/04/2020   Hypertension    Phreesia 09/04/2020   Insomnia    doesn't take any meds   Joint pain    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    Past Surgical History:  Procedure Laterality Date   ANAL RECTAL MANOMETRY N/A 07/17/2017   Procedure: ANO RECTAL MANOMETRY;  Surgeon: Mauri Pole, MD;  Location: [facility];  Service: Endoscopy;  Laterality: N/A;   COLECTOMY  Aug 2004   S/P Low Anterior Resxn  (Sigmoid Colon)   COLON SURGERY N/A    Phreesia 09/04/2020   COLONOSCOPY     DILATION AND CURETTAGE OF UTERUS  1991   JOINT REPLACEMENT N/A    Phreesia 09/04/2020   LUMBAR [place] SURGERY  Feb 2007   SPINE SURGERY N/A    Phreesia 09/04/2020   TOTAL HIP ARTHROPLASTY Right 05/27/2014   Procedure: RIGHT TOTAL HIP ARTHROPLASTY ANTERIOR APPROACH;  Surgeon: Hessie Dibble, MD;  Location: [place];  Service: Orthopedics;  Laterality: Right;   TOTAL HIP ARTHROPLASTY Left 08/25/2015   Procedure: TOTAL HIP ARTHROPLASTY ANTERIOR APPROACH;  Surgeon: Melrose Nakayama, MD;  Location: [facility];  Service: Orthopedics;  Laterality: Left;    Prior to Admission medications   Medication Sig Start Date End Date Taking? Authorizing Provider  Artificial Tear Ointment (DRY EYES OP) Apply to eye as needed.   Yes [provider]  gabapentin (NEURONTIN) 400 MG capsule Take 400 mg by mouth daily. 08/28/20  Yes [provider]  linaclotide Rolan Lipa) 145 MCG CAPS capsule Take 1 capsule (145 mcg total) by mouth daily before breakfast. 11/19/21  Yes Nesiah Jump, Venia Minks, MD  lisinopril (ZESTRIL) 40 MG tablet Take 1 tablet (40 mg total) by mouth daily. 10/13/21  Yes Sagardia, Ines Bloomer, MD  meloxicam (MOBIC) 15 MG tablet Take 1 tablet (15 mg total) by mouth daily.  10/12/21  Yes Horald Pollen, MD  simvastatin (ZOCOR) 40 MG tablet PLEASE SEE ATTACHED FOR DETAILED DIRECTIONS 10/28/21  Yes Richardo Priest, MD  amoxicillin (AMOXIL) 500 MG capsule TAKE 1 CAPSULE BY MOUTH DAILY. PLEASE TAKE 45 MINUTES PRE-DENTAL PROCEDURE. Patient not taking: Reported on 05/12/2022 03/25/22   Richardo Priest, MD  colesevelam (WELCHOL) 625 MG tablet TAKE 1 TABLET (625 MG TOTAL) BY MOUTH 3 (THREE) TIMES DAILY AS NEEDED. Patient not taking: Reported on 05/12/2022 03/04/22   Mauri Pole, MD  dicyclomine (BENTYL) 10 MG capsule TAKE 1 CAPSULE (10 MG TOTAL) BY MOUTH 3 (THREE) TIMES DAILY AS NEEDED FOR SPASMS. Patient not taking:  Reported on 05/12/2022 03/04/22   Mauri Pole, MD  finasteride (PROPECIA) 1 MG tablet Take 1 mg by mouth daily. Patient not taking: Reported on 04/20/2022 10/29/21   [provider]  Minoxidil (ROGAINE MENS EXTRA STRENGTH) 5 % FOAM as directed Externally Daily for 180 days Patient not taking: Reported on 05/12/2022    [provider]  Multiple Vitamins-Minerals (MULTIVITAMIN ADULT PO) Take by mouth daily. Patient not taking: Reported on 04/20/2022    [provider]    Current Outpatient Medications  Medication Sig Dispense Refill   Artificial Tear Ointment (DRY EYES OP) Apply to eye as needed.     gabapentin (NEURONTIN) 400 MG capsule Take 400 mg by mouth daily.     linaclotide (LINZESS) 145 MCG CAPS capsule Take 1 capsule (145 mcg total) by mouth daily before breakfast. 30 capsule 6   lisinopril (ZESTRIL) 40 MG tablet Take 1 tablet (40 mg total) by mouth daily. 90 tablet 3   meloxicam (MOBIC) 15 MG tablet Take 1 tablet (15 mg total) by mouth daily. 90 tablet 3   simvastatin (ZOCOR) 40 MG tablet PLEASE SEE ATTACHED FOR DETAILED DIRECTIONS 90 tablet 3   amoxicillin (AMOXIL) 500 MG capsule TAKE 1 CAPSULE BY MOUTH DAILY. PLEASE TAKE 45 MINUTES PRE-DENTAL PROCEDURE. (Patient not taking: Reported on 05/12/2022) 20 capsule 0   colesevelam (WELCHOL) 625 MG tablet TAKE 1 TABLET (625 MG TOTAL) BY MOUTH 3 (THREE) TIMES DAILY AS NEEDED. (Patient not taking: Reported on 05/12/2022) 270 tablet 1   dicyclomine (BENTYL) 10 MG capsule TAKE 1 CAPSULE (10 MG TOTAL) BY MOUTH 3 (THREE) TIMES DAILY AS NEEDED FOR SPASMS. (Patient not taking: Reported on 05/12/2022) 270 capsule 1   finasteride (PROPECIA) 1 MG tablet Take 1 mg by mouth daily. (Patient not taking: Reported on 04/20/2022)     Minoxidil (ROGAINE MENS EXTRA STRENGTH) 5 % FOAM as directed Externally Daily for 180 days (Patient not taking: Reported on 05/12/2022)     Multiple Vitamins-Minerals (MULTIVITAMIN ADULT PO) Take by  mouth daily. (Patient not taking: Reported on 04/20/2022)     Current Facility-Administered Medications  Medication Dose Route Frequency Provider Last Rate Last Admin   0.9 %  sodium chloride infusion  500 mL Intravenous Once Mauri Pole, MD        Allergies as of 05/12/2022   (No Known Allergies)    Family History  Adopted: Yes  Problem Relation Age of Onset   Colitis Neg Hx    Colon cancer Neg Hx    Colon polyps Neg Hx    Esophageal cancer Neg Hx    Rectal cancer Neg Hx    Stomach cancer Neg Hx     Social History   Socioeconomic History   Marital status: Married    Spouse name: Nicole Kindred   Number of  children: 1   Years of education: Not on file   Highest education level: Not on file  Occupational History   Occupation: office work  Tobacco Use   Smoking status: Former    Types: Cigarettes    Passive exposure: Past   Smokeless tobacco: Never   Tobacco comments:    "smoked some socially in my college days"  Vaping Use   Vaping Use: Never used  Substance and Sexual Activity   Alcohol use: Yes    Comment: occasionally beer/wine   Drug use: No   Sexual activity: Yes    Partners: Male    Birth control/protection: None  Other Topics Concern   Not on file  Social History Narrative   Lives with husband   Exercise walking 2 x weekly   Patient's family history -  UNKNOWN ADOPTED   Social Determinants of Health   Financial Resource Strain: Not on file  Food Insecurity: Not on file  Transportation Needs: Not on file  Physical Activity: Not on file  Stress: Not on file  Social Connections: Not on file  Intimate Partner Violence: Not on file    Review of Systems:  All other review of systems negative except as mentioned in the HPI.  Physical Exam: Vital signs in last 24 hours: Blood Pressure 139/74   Pulse 97   Temperature (Abnormal) 96 F (35.6 C)   Height '5\' 3"'$  (1.6 m)   Weight 147 lb (66.7 kg)   Oxygen Saturation 100%   Body Mass Index 26.04  kg/m  General:   Alert, NAD Lungs:  Clear .   Heart:  Regular rate and rhythm Abdomen:  Soft, nontender and nondistended. Neuro/Psych:  Alert and cooperative. Normal mood and affect. A and O x 3  Reviewed labs, radiology imaging, old records and pertinent past GI work up  Patient is appropriate for planned procedure(s) and anesthesia in an ambulatory setting   K. Denzil Magnuson , MD [phone]

## 2022-05-12 NOTE — Patient Instructions (Signed)
YOU HAD AN ENDOSCOPIC PROCEDURE TODAY AT [facility]:   Refer to the procedure report that was given to you for any specific questions about what was found during the examination.  If the procedure report does not answer your questions, please call your gastroenterologist to clarify.  If you requested that your care partner not be given the details of your procedure findings, then the procedure report has been included in a sealed envelope for you to review at your convenience later.  YOU SHOULD EXPECT: Some feelings of bloating in the abdomen. Passage of more gas than usual.  Walking can help get rid of the air that was put into your GI tract during the procedure and reduce the bloating. If you had a lower endoscopy (such as a colonoscopy or flexible sigmoidoscopy) you may notice spotting of blood in your stool or on the toilet paper. If you underwent a bowel prep for your procedure, you may not have a normal bowel movement for a few days.  Please Note:  You might notice some irritation and congestion in your nose or some drainage.  This is from the oxygen used during your procedure.  There is no need for concern and it should clear up in a day or so.  SYMPTOMS TO REPORT IMMEDIATELY:  Following lower endoscopy (colonoscopy or flexible sigmoidoscopy):  Excessive amounts of blood in the stool  Significant tenderness or worsening of abdominal pains  Swelling of the abdomen that is new, acute  Fever of 100F or higher   For urgent or emergent issues, a gastroenterologist can be reached at any hour by calling [phone]. Do not use MyChart messaging for urgent concerns.    DIET:  We do recommend a small meal at first, but then you may proceed to your regular diet.  Drink plenty of fluids but you should avoid alcoholic beverages for 24 hours.  MEDICATIONS: Continue present medications.  Please see handouts given to you by your recovery nurse: Polyps, diverticulosis,  hemrrhoids.  FOLLOW UP: Repeat colonoscopy in 5 years for surveillance based on pathology results.  Thank you for allowing [name] to provide for your healthcare needs today.  ACTIVITY:  You should plan to take it easy for the rest of today and you should NOT DRIVE or use heavy machinery until tomorrow (because of the sedation medicines used during the test).    FOLLOW UP: Our staff will call the number listed on your records the next business day following your procedure.  We will call around 7:15- 8:00 am to check on you and address any questions or concerns that you may have regarding the information given to you following your procedure. If we do not reach you, we will leave a message.     If any biopsies were taken you will be contacted by phone or by letter within the next 1-3 weeks.  Please call us at [phone] if you have not heard about the biopsies in 3 weeks.    SIGNATURES/CONFIDENTIALITY: You and/or your care partner have signed paperwork which will be entered into your electronic medical record.  These signatures attest to the fact that that the information above on your After Visit Summary has been reviewed and is understood.  Full responsibility of the confidentiality of this discharge information lies with you and/or your care-partner.

## 2022-05-13 ENCOUNTER — Telehealth: Payer: Self-pay

## 2022-05-13 NOTE — Telephone Encounter (Signed)
  Follow up Call-     05/12/2022    8:30 AM  Call back number  Post procedure Call Back phone  # [phone]  Permission to leave phone message Yes     Patient questions:  Do you have a fever, pain , or abdominal swelling? No. Pain Score  0 *  Have you tolerated food without any problems? Yes.    Have you been able to return to your normal activities? Yes.    Do you have any questions about your discharge instructions: Diet   No. Medications  No. Follow up visit  No.  Do you have questions or concerns about your Care? No.  Actions: * If pain score is 4 or above: No action needed, pain <4.

## 2022-05-30 ENCOUNTER — Encounter: Payer: Self-pay | Admitting: Gastroenterology

## 2022-07-21 LAB — HM MAMMOGRAPHY

## 2022-09-06 NOTE — Progress Notes (Deleted)
[name] [name]    ZW:[id number]    [birth year]  Primary Care Physician:Sagardia, Ines Bloomer, MD  Referring Physician: Horald Pollen, [place],  [place] [id number]   Chief complaint:  Constipation alternating diarrhea, IBS  HPI:  67 year old female with history of colon cancer status post resection here for follow-up visit for fatty liver, constipation alternating with diarrhea  I last saw the patient on 11/19/2021. At that time she complained of back and forth diarrhea and constipation. She reported having 3-4 BM a day. She reported taking Linzess yet struggled with complete evacuation of stool. She had tried pelvic floor therapy without improvements.  Today, patient states that she has not seen any improvements on Linzess 145 mcg. She complains of persistent constipation and alternating diarrhea. She states that she takes Linzess daily yet she doesn't have any BM until few days later. She typically has BM every 4 days or so, but when does have a BM she will have up to 10 times a day. She never feels that she fully evacuates her bowels.  She reports abdominal pain and cramping and she does not have a bowel movement for few days.    She has tried Dulcolax which caused her severe diarrhea with diaphoresis and feeling very poorly.  Patient also reports lower back pain and bilateral flank pain.  She reports having back surgery, 2 hip replacements- left is most recent, and spine arthritis.      GI Hx:   Colonoscopy 05/12/22 - The perianal and digital rectal examinations were normal. - A 3 mm polyp was found in the ascending colon. The polyp was sessile. The polyp was removed with a cold snare. Resection and retrieval were complete. - Scattered small-mouthed diverticula were found in the sigmoid colon. - There was evidence of a prior end-to-end colo-colonic anastomosis in the recto-sigmoid colon. This was patent and was characterized by healthy appearing  mucosa. - Non-bleeding external and internal hemorrhoids were found during retroflexion. The hemorrhoids were small  US abdomen RUQ 05/12/21 IMPRESSION: ULTRASOUND RUQ:   Hepatic steatosis.  No focal liver lesion.   Colonoscopy April 25, 2019: - Mild diverticulosis in the descending colon. - Non-bleeding internal hemorrhoids. - Patent functional end-to-end colo-colonic anastomosis, characterized by healthy appearing mucosa. - No specimens   Colonoscopy March 29, 2018: 3 polyps (tubular adenoma and sessile serrated polyp) 5 to 11 mm in size removed from descending and transverse colon with cold snare.  2 polyps (tubular adenoma ) less than 5 mm in size removed with forceps.  Left-sided diverticulosis.  Functional colocolonic anastomosis.  Anorectal manometry July 17, 2017 showed weak anal sphincter and dyssynergic defecation She subsequently did pelvic floor physical therapy with no significant improvement of symptoms     Current Outpatient Medications:    Artificial Tear Ointment (DRY EYES OP), Apply to eye as needed., Disp: , Rfl:    colesevelam (WELCHOL) 625 MG tablet, TAKE 1 TABLET (625 MG TOTAL) BY MOUTH 3 (THREE) TIMES DAILY AS NEEDED., Disp: 270 tablet, Rfl: 1   dicyclomine (BENTYL) 10 MG capsule, TAKE 1 CAPSULE (10 MG TOTAL) BY MOUTH 3 (THREE) TIMES DAILY AS NEEDED FOR SPASMS., Disp: 270 capsule, Rfl: 1   finasteride (PROPECIA) 1 MG tablet, Take 1 mg by mouth daily., Disp: , Rfl:    gabapentin (NEURONTIN) 400 MG capsule, Take 400 mg by mouth daily., Disp: , Rfl:    linaclotide (LINZESS) 290 MCG CAPS capsule,  Take 1 capsule (290 mcg total) by mouth daily before breakfast., Disp: 90 capsule, Rfl: 3   lisinopril (ZESTRIL) 40 MG tablet, Take 1 tablet (40 mg total) by mouth daily., Disp: 90 tablet, Rfl: 3   meloxicam (MOBIC) 15 MG tablet, Take 1 tablet (15 mg total) by mouth daily., Disp: 90 tablet, Rfl: 3   Minoxidil (ROGAINE MENS EXTRA STRENGTH) 5 % FOAM, , Disp: , Rfl:     Multiple Vitamins-Minerals (MULTIVITAMIN ADULT PO), Take by mouth daily., Disp: , Rfl:    simvastatin (ZOCOR) 40 MG tablet, PLEASE SEE ATTACHED FOR DETAILED DIRECTIONS, Disp: 90 tablet, Rfl: 3  Current Facility-Administered Medications:    0.9 %  sodium chloride infusion, 500 mL, Intravenous, Once, Nandigam, Kavitha V, MD    Allergies as of 09/07/2022   (No Known Allergies)    Past Medical History:  Diagnosis Date   Anemia    hx of, not at this time   ANXIETY DISORDER- mixed disorder w/ depressive symptoms 02/07/2014   Aortic regurgitation    Arthritis    "right hip; left hip; most of my joints" (05/27/2014)   Blood transfusion without reported diagnosis    Phreesia 09/04/2020   Cancer ([place])    Phreesia 09/04/2020   Depression    Diverticulosis    Essential hypertension, benign    takes Lisinopril-HCTZ daily   Heart murmur    High cholesterol    takes Pravastatin daily   History of blood transfusion    no abnormal reaction noted 2015   History of colon polyps    benign   History of colonic polyps 05/21/2021   Hyperlipidemia    Phreesia 09/04/2020   Hypertension    Phreesia 09/04/2020   Insomnia    doesn't take any meds   Joint pain    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    Past Surgical History:  Procedure Laterality Date   ANAL RECTAL MANOMETRY N/A 07/17/2017   Procedure: ANO RECTAL MANOMETRY;  Surgeon: Mauri Pole, MD;  [facility];  Service: Endoscopy;  Laterality: N/A;   COLECTOMY  Aug 2004   S/P Low Anterior Resxn (Sigmoid Colon)   COLON SURGERY N/A    Phreesia 09/04/2020   COLONOSCOPY     DILATION AND CURETTAGE OF UTERUS  1991   JOINT REPLACEMENT N/A    Phreesia  09/04/2020   LUMBAR [place] SURGERY  Feb 2007   SPINE SURGERY N/A    Phreesia 09/04/2020   TOTAL HIP ARTHROPLASTY Right 05/27/2014   Procedure: RIGHT TOTAL HIP ARTHROPLASTY ANTERIOR APPROACH;  Surgeon: Hessie Dibble, MD;  Location: [facility];  Service: Orthopedics;  Laterality: Right;   TOTAL HIP ARTHROPLASTY Left 08/25/2015   Procedure: TOTAL HIP ARTHROPLASTY ANTERIOR APPROACH;  Surgeon: Melrose Nakayama, MD;  Location: [facility];  Service: Orthopedics;  Laterality: Left;    Family History  Adopted: Yes  Problem Relation Age of Onset   Colitis Neg Hx    Colon cancer Neg Hx    Colon polyps Neg Hx    Esophageal cancer Neg Hx    Rectal cancer Neg Hx    Stomach cancer Neg Hx     Social History   Socioeconomic History  Marital status: Married    Spouse name: Nicole Kindred   Number of children: 1   Years of education: Not on file   Highest education level: Not on file  Occupational History   Occupation: office work  Tobacco Use   Smoking status: Former    Types: Cigarettes    Passive exposure: Past   Smokeless tobacco: Never   Tobacco comments:    "smoked some socially in my college days"  Vaping Use   Vaping Use: Never used  Substance and Sexual Activity   Alcohol use: Yes    Comment: occasionally beer/wine   Drug use: No   Sexual activity: Yes    Partners: Male    Birth control/protection: None  Other Topics Concern   Not on file  Social History Narrative   Lives with husband   Exercise walking 2 x weekly   Patient's family history -  UNKNOWN ADOPTED   Social Determinants of Health   Financial Resource Strain: Not on file  Food Insecurity: Not on file  Transportation Needs: Not on file  Physical Activity: Not on file  Stress: Not on file  Social Connections: Not on file  Intimate Partner Violence: Not on file      Review of systems: Review of Systems  Constitutional:  Negative for appetite change and fever.  HENT:  Negative for trouble swallowing.   Respiratory:   Negative for cough and shortness of breath.   Cardiovascular:  Negative for chest pain.  Gastrointestinal:  Positive for abdominal distention, abdominal pain, constipation and diarrhea. Negative for anal bleeding, blood in stool, nausea, rectal pain and vomiting.  Genitourinary:  Negative for dysuria.  Musculoskeletal:  Positive for arthralgias and back pain.  Skin:  Negative for rash.  Neurological:  Negative for weakness.  All other systems reviewed and are negative.     Physical Exam: Vitals:   09/07/22 0830  BP: 120/60  Pulse: 82  SpO2: 98%    Body mass index is 25.79 kg/m. General: well-appearing  Eyes: sclera anicteric, no redness GI: soft, with active bowel sounds. No guarding or palpable organomegaly noted. LLQ/RLQ deep pelvic pain. Back: scoliosis, kyphosis, no tenderness, bony spur mid lower back Skin; warm and dry, no rash or jaundice noted Neuro: awake, alert and oriented x 3. Normal gross motor function and fluent speech   Data Reviewed:  Reviewed labs, radiology imaging, old records and pertinent past GI work up   Assessment and Plan/Recommendations:  67 year old very pleasant female with history of colon cancer s/p resection with IBS, alternating diarrhea and constipation secondary to outlet dysfunction and dyssynergic defecation   Constipation with outlet dysfunction, IBS with alternating constipation and diarrhea and pelvic floor dysfunction:  Increase Linzess to 290 mcg daily. Advised patient to hold Linzess if she has multiple bowel movements. Use Imodium PRN.  Advised patient to increase Benefiber to 1 tbsp 3 times daily with meals Increase water intake to 8 to 10 cups daily  Will restart pelvic floor physical therapy  Use WelChol as needed if has persistent diarrhea, likely has a component of bile salt induced diarrhea in addition to overflow diarrhea.  She is s/p right colon resection  Use Bentyl 10 mg up to 3 times daily as needed for abdominal  discomfort and cramping   She is up-to-date with colorectal cancer surveillance, she has personal history of colon cancer and multiple adenomatous colon polyps.  Genetic testing negative for Lynch syndrome  Fatty liver on CT: Continue with dietary changes and exercise  Advised patient to follow up with ortho for right hip >> left hip pain and back pain.   Return in 3 months or sooner if needed  The patient was provided an opportunity to ask questions and all were answered. The patient agreed with the plan and demonstrated an understanding of the instructions.   I,Safa M Kadhim,acting as a scribe for Harl Bowie, MD.,have documented all relevant documentation on the behalf of Harl Bowie, MD,as directed by  Harl Bowie, MD while in the presence of Harl Bowie, MD.   I, Harl Bowie, MD, have reviewed all documentation for this visit. The documentation on 09/07/22 for the exam, diagnosis, procedures, and orders are all accurate and complete.    Damaris Hippo , MD [phone]      CC: [place], [place]

## 2022-09-07 ENCOUNTER — Ambulatory Visit: Payer: 59 | Admitting: Gastroenterology

## 2022-09-07 ENCOUNTER — Encounter: Payer: Self-pay | Admitting: Gastroenterology

## 2022-09-07 VITALS — BP 120/60 | HR 82 | Ht 63.0 in | Wt 145.6 lb

## 2022-09-07 DIAGNOSIS — M6289 Other specified disorders of muscle: Secondary | ICD-10-CM | POA: Diagnosis not present

## 2022-09-07 DIAGNOSIS — M549 Dorsalgia, unspecified: Secondary | ICD-10-CM | POA: Diagnosis not present

## 2022-09-07 DIAGNOSIS — K5902 Outlet dysfunction constipation: Secondary | ICD-10-CM

## 2022-09-07 DIAGNOSIS — M25551 Pain in right hip: Secondary | ICD-10-CM

## 2022-09-07 DIAGNOSIS — G8929 Other chronic pain: Secondary | ICD-10-CM

## 2022-09-07 DIAGNOSIS — M25552 Pain in left hip: Secondary | ICD-10-CM

## 2022-09-07 DIAGNOSIS — Z85038 Personal history of other malignant neoplasm of large intestine: Secondary | ICD-10-CM

## 2022-09-07 MED ORDER — LINACLOTIDE 290 MCG PO CAPS: 290.0000 ug | ORAL_CAPSULE | Freq: Every day | ORAL | 3 refills | Status: DC

## 2022-09-07 NOTE — Patient Instructions (Signed)
We have sent the following medications to your pharmacy for you to pick up at your convenience: Linzess 290 mcg  We referred you to Pelvic Floor Therapy and they will contact you with that appointment  Follow up with ortho for bilateral hip and back pain  Take benefiber 1 tablespoon three times a day  _______________________________________________________  If your blood pressure at your visit was 140/90 or greater, please contact your primary care physician to follow up on this.  _______________________________________________________  If you are age 79 or older, your body mass index should be between 23-30. Your Body mass index is 25.79 kg/m. If this is out of the aforementioned range listed, please consider follow up with your Primary Care Provider.  If you are age 75 or younger, your body mass index should be between 19-25. Your Body mass index is 25.79 kg/m. If this is out of the aformentioned range listed, please consider follow up with your Primary Care Provider.   ________________________________________________________  The [facility] providers would like to encourage you to use [facility] to communicate with providers for non-urgent requests or questions.  Due to long hold times on the telephone, sending your provider a message by [facility] may be a faster and more efficient way to get a response.  Please allow 48 business hours for a response.  Please remember that this is for non-urgent requests.  _______________________________________________________   I appreciate the  opportunity to care for you  Thank You   Harl Bowie , MD

## 2022-09-09 NOTE — Progress Notes (Signed)
[name] [name]    ZW:[id number]    [birth year]  Primary Care Physician:Sagardia, Ines Bloomer, MD  Referring Physician: Horald Pollen, [place],  [place] [address]   Chief complaint:  Constipation alternating diarrhea, IBS  HPI:  67 year old female with history of colon cancer status post resection here for follow-up visit for fatty liver, constipation alternating with diarrhea  I last saw the patient on 11/19/2021. At that time she complained of back and forth diarrhea and constipation. She reported having 3-4 BM a day. She reported taking Linzess yet struggled with complete evacuation of stool. She had tried pelvic floor therapy without improvements.  Today, patient states that she has not seen any improvements on Linzess 145 mcg. She complains of persistent constipation and alternating diarrhea. She states that she takes Linzess daily yet she doesn't have any BM until few days later. She typically has BM every 4 days or so, but when does have a BM she will have up to 10 times a day. She never feels that she fully evacuates her bowels.  She reports abdominal pain and cramping and she does not have a bowel movement for few days.    She has tried Dulcolax which caused her severe diarrhea with diaphoresis and feeling very poorly.  Patient also reports lower back pain and bilateral flank pain.  She reports having back surgery, 2 hip replacements- left is most recent, and spine arthritis.      GI Hx:   Colonoscopy 05/12/22 - The perianal and digital rectal examinations were normal. - A 3 mm polyp was found in the ascending colon. The polyp was sessile. The polyp was removed with a cold snare. Resection and retrieval were complete. - Scattered small-mouthed diverticula were found in the sigmoid colon. - There was evidence of a prior end-to-end colo-colonic anastomosis in the recto-sigmoid colon. This was patent and was characterized by healthy appearing  mucosa. - Non-bleeding external and internal hemorrhoids were found during retroflexion. The hemorrhoids were small  US abdomen RUQ 05/12/21 IMPRESSION: ULTRASOUND RUQ:   Hepatic steatosis.  No focal liver lesion.   Colonoscopy April 25, 2019: - Mild diverticulosis in the descending colon. - Non-bleeding internal hemorrhoids. - Patent functional end-to-end colo-colonic anastomosis, characterized by healthy appearing mucosa. - No specimens   Colonoscopy March 29, 2018: 3 polyps (tubular adenoma and sessile serrated polyp) 5 to 11 mm in size removed from descending and transverse colon with cold snare.  2 polyps (tubular adenoma ) less than 5 mm in size removed with forceps.  Left-sided diverticulosis.  Functional colocolonic anastomosis.  Anorectal manometry July 17, 2017 showed weak anal sphincter and dyssynergic defecation She subsequently did pelvic floor physical therapy with no significant improvement of symptoms     Current Outpatient Medications:    Artificial Tear Ointment (DRY EYES OP), Apply to eye as needed., Disp: , Rfl:    colesevelam (WELCHOL) 625 MG tablet, TAKE 1 TABLET (625 MG TOTAL) BY MOUTH 3 (THREE) TIMES DAILY AS NEEDED., Disp: 270 tablet, Rfl: 1   dicyclomine (BENTYL) 10 MG capsule, TAKE 1 CAPSULE (10 MG TOTAL) BY MOUTH 3 (THREE) TIMES DAILY AS NEEDED FOR SPASMS., Disp: 270 capsule, Rfl: 1   finasteride (PROPECIA) 1 MG tablet, Take 1 mg by mouth daily., Disp: , Rfl:    gabapentin (NEURONTIN) 400 MG capsule, Take 400 mg by mouth daily., Disp: , Rfl:    linaclotide (LINZESS) 290 MCG CAPS capsule,  Take 1 capsule (290 mcg total) by mouth daily before breakfast., Disp: 90 capsule, Rfl: 3   lisinopril (ZESTRIL) 40 MG tablet, Take 1 tablet (40 mg total) by mouth daily., Disp: 90 tablet, Rfl: 3   meloxicam (MOBIC) 15 MG tablet, Take 1 tablet (15 mg total) by mouth daily., Disp: 90 tablet, Rfl: 3   Minoxidil (ROGAINE MENS EXTRA STRENGTH) 5 % FOAM, , Disp: , Rfl:     Multiple Vitamins-Minerals (MULTIVITAMIN ADULT PO), Take by mouth daily., Disp: , Rfl:    simvastatin (ZOCOR) 40 MG tablet, PLEASE SEE ATTACHED FOR DETAILED DIRECTIONS, Disp: 90 tablet, Rfl: 3  Current Facility-Administered Medications:    0.9 %  sodium chloride infusion, 500 mL, Intravenous, Once, Omid Deardorff V, MD    Allergies as of 09/07/2022   (No Known Allergies)    Past Medical History:  Diagnosis Date   Anemia    hx of, not at this time   ANXIETY DISORDER- mixed disorder w/ depressive symptoms 02/07/2014   Aortic regurgitation    Arthritis    "right hip; left hip; most of my joints" (05/27/2014)   Blood transfusion without reported diagnosis    Phreesia 09/04/2020   Cancer ([place])    Phreesia 09/04/2020   Depression    Diverticulosis    Essential hypertension, benign    takes Lisinopril-HCTZ daily   Heart murmur    High cholesterol    takes Pravastatin daily   History of blood transfusion    no abnormal reaction noted 2015   History of colon polyps    benign   History of colonic polyps 05/21/2021   Hyperlipidemia    Phreesia 09/04/2020   Hypertension    Phreesia 09/04/2020   Insomnia    doesn't take any meds   Joint pain    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    Past Surgical History:  Procedure Laterality Date   ANAL RECTAL MANOMETRY N/A 07/17/2017   Procedure: ANO RECTAL MANOMETRY;  Surgeon: Mauri Pole, MD;  Location: [facility];  Service: Endoscopy;  Laterality: N/A;   COLECTOMY  Aug 2004   S/P Low Anterior Resxn (Sigmoid Colon)   COLON SURGERY N/A    Phreesia 09/04/2020   COLONOSCOPY     DILATION AND CURETTAGE OF UTERUS  1991   JOINT REPLACEMENT N/A    Phreesia  09/04/2020   LUMBAR [place] SURGERY  Feb 2007   SPINE SURGERY N/A    Phreesia 09/04/2020   TOTAL HIP ARTHROPLASTY Right 05/27/2014   Procedure: RIGHT TOTAL HIP ARTHROPLASTY ANTERIOR APPROACH;  Surgeon: Hessie Dibble, MD;  Location: [facility];  Service: Orthopedics;  Laterality: Right;   TOTAL HIP ARTHROPLASTY Left 08/25/2015   Procedure: TOTAL HIP ARTHROPLASTY ANTERIOR APPROACH;  Surgeon: Melrose Nakayama, MD;  Location: [facility];  Service: Orthopedics;  Laterality: Left;    Family History  Adopted: Yes  Problem Relation Age of Onset   Colitis Neg Hx    Colon cancer Neg Hx    Colon polyps Neg Hx    Esophageal cancer Neg Hx    Rectal cancer Neg Hx    Stomach cancer Neg Hx     Social History   Socioeconomic History  Marital status: Married    Spouse name: Nicole Kindred   Number of children: 1   Years of education: Not on file   Highest education level: Not on file  Occupational History   Occupation: office work  Tobacco Use   Smoking status: Former    Types: Cigarettes    Passive exposure: Past   Smokeless tobacco: Never   Tobacco comments:    "smoked some socially in my college days"  Vaping Use   Vaping Use: Never used  Substance and Sexual Activity   Alcohol use: Yes    Comment: occasionally beer/wine   Drug use: No   Sexual activity: Yes    Partners: Male    Birth control/protection: None  Other Topics Concern   Not on file  Social History Narrative   Lives with husband   Exercise walking 2 x weekly   Patient's family history -  UNKNOWN ADOPTED   Social Determinants of Health   Financial Resource Strain: Not on file  Food Insecurity: Not on file  Transportation Needs: Not on file  Physical Activity: Not on file  Stress: Not on file  Social Connections: Not on file  Intimate Partner Violence: Not on file      Review of systems: Review of Systems  Constitutional:  Negative for appetite change and fever.  HENT:  Negative for trouble swallowing.   Respiratory:   Negative for cough and shortness of breath.   Cardiovascular:  Negative for chest pain.  Gastrointestinal:  Positive for abdominal distention, abdominal pain, constipation and diarrhea. Negative for anal bleeding, blood in stool, nausea, rectal pain and vomiting.  Genitourinary:  Negative for dysuria.  Musculoskeletal:  Positive for arthralgias and back pain.  Skin:  Negative for rash.  Neurological:  Negative for weakness.  All other systems reviewed and are negative.     Physical Exam: Vitals:   09/07/22 0830  BP: 120/60  Pulse: 82  SpO2: 98%    Body mass index is 25.79 kg/m. General: well-appearing  Eyes: sclera anicteric, no redness GI: soft, with active bowel sounds. No guarding or palpable organomegaly noted. LLQ/RLQ deep pelvic pain. Back: scoliosis, kyphosis, no tenderness, bony spur mid lower back Skin; warm and dry, no rash or jaundice noted Neuro: awake, alert and oriented x 3. Normal gross motor function and fluent speech   Data Reviewed:  Reviewed labs, radiology imaging, old records and pertinent past GI work up   Assessment and Plan/Recommendations:  67 year old very pleasant female with history of colon cancer s/p resection with IBS, alternating diarrhea and constipation secondary to outlet dysfunction and dyssynergic defecation   Constipation with outlet dysfunction, IBS with alternating constipation and diarrhea and pelvic floor dysfunction:  Increase Linzess to 290 mcg daily. Advised patient to hold Linzess if she has multiple bowel movements. Use Imodium PRN.  Advised patient to increase Benefiber to 1 tbsp 3 times daily with meals Increase water intake to 8 to 10 cups daily  Will restart pelvic floor physical therapy  Use WelChol as needed if has persistent diarrhea, likely has a component of bile salt induced diarrhea in addition to overflow diarrhea.  She is s/p right colon resection  Use Bentyl 10 mg up to 3 times daily as needed for abdominal  discomfort and cramping   She is up-to-date with colorectal cancer surveillance, she has personal history of colon cancer and multiple adenomatous colon polyps.  Genetic testing negative for Lynch syndrome  Fatty liver on CT: Continue with dietary changes and exercise  Advised patient to follow up with ortho for right hip >> left hip pain and back pain.   Return in 3 months or sooner if needed  The patient was provided an opportunity to ask questions and all were answered. The patient agreed with the plan and demonstrated an understanding of the instructions.   I,Safa M Kadhim,acting as a scribe for Harl Bowie, MD.,have documented all relevant documentation on the behalf of Harl Bowie, MD,as directed by  Harl Bowie, MD while in the presence of Harl Bowie, MD.   I, Harl Bowie, MD, have reviewed all documentation for this visit. The documentation on 09/07/22 for the exam, diagnosis, procedures, and orders are all accurate and complete.    Damaris Hippo , MD [phone]      CC: [place], [place]

## 2022-09-13 ENCOUNTER — Encounter: Payer: Self-pay | Admitting: Emergency Medicine

## 2022-09-13 ENCOUNTER — Ambulatory Visit: Payer: 59 | Admitting: Emergency Medicine

## 2022-09-13 VITALS — BP 118/68 | HR 63 | Temp 97.8°F | Ht 63.0 in | Wt 148.5 lb

## 2022-09-13 DIAGNOSIS — K760 Fatty (change of) liver, not elsewhere classified: Secondary | ICD-10-CM | POA: Diagnosis not present

## 2022-09-13 DIAGNOSIS — G629 Polyneuropathy, unspecified: Secondary | ICD-10-CM | POA: Diagnosis not present

## 2022-09-13 DIAGNOSIS — R7303 Prediabetes: Secondary | ICD-10-CM

## 2022-09-13 DIAGNOSIS — I10 Essential (primary) hypertension: Secondary | ICD-10-CM | POA: Diagnosis not present

## 2022-09-13 DIAGNOSIS — E785 Hyperlipidemia, unspecified: Secondary | ICD-10-CM | POA: Diagnosis not present

## 2022-09-13 DIAGNOSIS — I700 Atherosclerosis of aorta: Secondary | ICD-10-CM

## 2022-09-13 DIAGNOSIS — I7789 Other specified disorders of arteries and arterioles: Secondary | ICD-10-CM

## 2022-09-13 LAB — URINALYSIS
Bilirubin Urine: NEGATIVE
Hgb urine dipstick: NEGATIVE
Ketones, ur: NEGATIVE
Leukocytes,Ua: NEGATIVE
Nitrite: NEGATIVE
Specific Gravity, Urine: 1.015 (ref 1.000–1.030)
Total Protein, Urine: NEGATIVE
Urine Glucose: NEGATIVE
Urobilinogen, UA: 0.2 (ref 0.0–1.0)
pH: 6 (ref 5.0–8.0)

## 2022-09-13 LAB — CBC WITH DIFFERENTIAL/PLATELET
Basophils Absolute: 0 10*3/uL (ref 0.0–0.1)
Basophils Relative: 0.1 % (ref 0.0–3.0)
Eosinophils Absolute: 0 10*3/uL (ref 0.0–0.7)
Eosinophils Relative: 0 % (ref 0.0–5.0)
HCT: 38.3 % (ref 36.0–46.0)
Hemoglobin: 13.1 g/dL (ref 12.0–15.0)
Lymphocytes Relative: 6.1 % — ABNORMAL LOW (ref 12.0–46.0)
Lymphs Abs: 0.6 10*3/uL — ABNORMAL LOW (ref 0.7–4.0)
MCHC: 34.1 g/dL (ref 30.0–36.0)
MCV: 92.2 fl (ref 78.0–100.0)
Monocytes Absolute: 0.3 10*3/uL (ref 0.1–1.0)
Monocytes Relative: 3.6 % (ref 3.0–12.0)
Neutro Abs: 8.7 10*3/uL — ABNORMAL HIGH (ref 1.4–7.7)
Neutrophils Relative %: 90.2 % — ABNORMAL HIGH (ref 43.0–77.0)
Platelets: 194 10*3/uL (ref 150.0–400.0)
RBC: 4.16 Mil/uL (ref 3.87–5.11)
RDW: 12.5 % (ref 11.5–15.5)
WBC: 9.7 10*3/uL (ref 4.0–10.5)

## 2022-09-13 LAB — HEMOGLOBIN A1C: Hgb A1c MFr Bld: 6.2 % (ref 4.6–6.5)

## 2022-09-13 LAB — COMPREHENSIVE METABOLIC PANEL
ALT: 14 U/L (ref 0–35)
AST: 13 U/L (ref 0–37)
Albumin: 4.4 g/dL (ref 3.5–5.2)
Alkaline Phosphatase: 43 U/L (ref 39–117)
BUN: 24 mg/dL — ABNORMAL HIGH (ref 6–23)
CO2: 24 mEq/L (ref 19–32)
Calcium: 9.9 mg/dL (ref 8.4–10.5)
Chloride: 91 mEq/L — ABNORMAL LOW (ref 96–112)
Creatinine, Ser: 0.89 mg/dL (ref 0.40–1.20)
GFR: 67.72 mL/min (ref >60.00)
Glucose, Bld: 135 mg/dL — ABNORMAL HIGH (ref 70–99)
Potassium: 5 mEq/L (ref 3.5–5.1)
Sodium: 124 mEq/L — ABNORMAL LOW (ref 135–145)
Total Bilirubin: 0.6 mg/dL (ref 0.2–1.2)
Total Protein: 7.5 g/dL (ref 6.0–8.3)

## 2022-09-13 LAB — LIPID PANEL
Cholesterol: 181 mg/dL (ref 0–200)
HDL: 92.6 mg/dL (ref >39.00)
LDL Cholesterol: 77 mg/dL (ref 0–99)
NonHDL: 88.11
Total CHOL/HDL Ratio: 2
Triglycerides: 57 mg/dL (ref 0.0–149.0)
VLDL: 11.4 mg/dL (ref 0.0–40.0)

## 2022-09-13 NOTE — Patient Instructions (Signed)
Health Maintenance After Age 65 After age 65, you are at a higher risk for certain long-term diseases and infections as well as injuries from falls. Falls are a major cause of broken bones and head injuries in people who are older than age 66. Getting regular preventive care can help to keep you healthy and well. Preventive care includes getting regular testing and making lifestyle changes as recommended by your health care provider. Talk with your health care provider about: Which screenings and tests you should have. A screening is a test that checks for a disease when you have no symptoms. A diet and exercise plan that is right for you. What should I know about screenings and tests to prevent falls? Screening and testing are the best ways to find a health problem early. Early diagnosis and treatment give you the best chance of managing medical conditions that are common after age 66. Certain conditions and lifestyle choices may make you more likely to have a fall. Your health care provider may recommend: Regular vision checks. Poor vision and conditions such as cataracts can make you more likely to have a fall. If you wear glasses, make sure to get your prescription updated if your vision changes. Medicine review. Work with your health care provider to regularly review all of the medicines you are taking, including over-the-counter medicines. Ask your health care provider about any side effects that may make you more likely to have a fall. Tell your health care provider if any medicines that you take make you feel dizzy or sleepy. Strength and balance checks. Your health care provider may recommend certain tests to check your strength and balance while standing, walking, or changing positions. Foot health exam. Foot pain and numbness, as well as not wearing proper footwear, can make you more likely to have a fall. Screenings, including: Osteoporosis screening. Osteoporosis is a condition that causes  the bones to get weaker and break more easily. Blood pressure screening. Blood pressure changes and medicines to control blood pressure can make you feel dizzy. Depression screening. You may be more likely to have a fall if you have a fear of falling, feel depressed, or feel unable to do activities that you used to do. Alcohol use screening. Using too much alcohol can affect your balance and may make you more likely to have a fall. Follow these instructions at home: Lifestyle Do not drink alcohol if: Your health care provider tells you not to drink. If you drink alcohol: Limit how much you have to: 0-1 drink a day for women. 0-2 drinks a day for men. Know how much alcohol is in your drink. In the [place] one drink equals one 12 oz bottle of beer (355 mL), one 5 oz glass of wine (148 mL), or one 1 oz glass of hard liquor (44 mL). Do not use any products that contain nicotine or tobacco. These products include cigarettes, chewing tobacco, and vaping devices, such as e-cigarettes. If you need help quitting, ask your health care provider. Activity  Follow a regular exercise program to stay fit. This will help you maintain your balance. Ask your health care provider what types of exercise are appropriate for you. If you need a cane or walker, use it as recommended by your health care provider. Wear supportive shoes that have nonskid soles. Safety  Remove any tripping hazards, such as rugs, cords, and clutter. Install safety equipment such as grab bars in bathrooms and safety rails on stairs. Keep rooms and walkways   well-lit. General instructions Talk with your health care provider about your risks for falling. Tell your health care provider if: You fall. Be sure to tell your health care provider about all falls, even ones that seem minor. You feel dizzy, tiredness (fatigue), or off-balance. Take over-the-counter and prescription medicines only as told by your health care provider. These include  supplements. Eat a healthy diet and maintain a healthy weight. A healthy diet includes low-fat dairy products, low-fat (lean) meats, and fiber from whole grains, beans, and lots of fruits and vegetables. Stay current with your vaccines. Schedule regular health, dental, and eye exams. Summary Having a healthy lifestyle and getting preventive care can help to protect your health and wellness after age 66. Screening and testing are the best way to find a health problem early and help you avoid having a fall. Early diagnosis and treatment give you the best chance for managing medical conditions that are more common for people who are older than age 66. Falls are a major cause of broken bones and head injuries in people who are older than age 66. Take precautions to prevent a fall at home. Work with your health care provider to learn what changes you can make to improve your health and wellness and to prevent falls. This information is not intended to replace advice given to you by your health care provider. Make sure you discuss any questions you have with your health care provider. Document Revised: 11/16/2020 Document Reviewed: 11/16/2020 Elsevier Patient Education  2023 Elsevier Inc.  

## 2022-09-13 NOTE — Assessment & Plan Note (Signed)
Diet and nutrition discussed.  Continue simvastatin 40 mg daily.

## 2022-09-13 NOTE — Assessment & Plan Note (Signed)
Stable.  Continues surveillance by cardiology

## 2022-09-13 NOTE — Assessment & Plan Note (Signed)
Stable.  Continues gabapentin 400 mg at bedtime

## 2022-09-13 NOTE — Assessment & Plan Note (Signed)
Stable.  Diet and nutrition discussed.  Continues simvastatin 40 mg daily. The 10-year ASCVD risk score (Arnett DK, et al., 2019) is: 6%   Values used to calculate the score:     Age: 67 years     Sex: Female     Is Non-Hispanic African American: No     Diabetic: No     Tobacco smoker: No     Systolic Blood Pressure: 123456 mmHg     Is BP treated: Yes     HDL Cholesterol: 68 mg/dL     Total Cholesterol: 167 mg/dL

## 2022-09-13 NOTE — Assessment & Plan Note (Signed)
Diet and nutrition discussed.  Hemoglobin A1c done today.

## 2022-09-13 NOTE — Assessment & Plan Note (Signed)
Well-controlled hypertension. Continue lisinopril 40 mg daily. BP Readings from Last 3 Encounters:  09/13/22 118/68  09/07/22 120/60  05/12/22 (!) 101/48

## 2022-09-13 NOTE — Progress Notes (Signed)
[name] [name] 67 y.o.   Chief Complaint  Patient presents with   Follow-up    F/u appt, patient wants labs down to check her A1c     HISTORY OF PRESENT ILLNESS: This is a 67 y.o. female here for follow-up of chronic medical conditions Requesting blood work History of neuropathy to right lower leg.  Takes gabapentin at nighttime. No other complaints or medical concerns today.  HPI   Prior to Admission medications   Medication Sig Start Date End Date Taking? Authorizing Provider  colesevelam (WELCHOL) 625 MG tablet TAKE 1 TABLET (625 MG TOTAL) BY MOUTH 3 (THREE) TIMES DAILY AS NEEDED. 03/04/22  Yes Nandigam, Venia Minks, MD  finasteride (PROPECIA) 1 MG tablet Take 1 mg by mouth daily. 10/29/21  Yes [provider]  gabapentin (NEURONTIN) 400 MG capsule Take 400 mg by mouth daily. 08/28/20  Yes [provider]  linaclotide (LINZESS) 290 MCG CAPS capsule Take 1 capsule (290 mcg total) by mouth daily before breakfast. 09/07/22  Yes Nandigam, Venia Minks, MD  lisinopril (ZESTRIL) 40 MG tablet Take 1 tablet (40 mg total) by mouth daily. 10/13/21  Yes Marquan Vokes, Ines Bloomer, MD  meloxicam (MOBIC) 15 MG tablet Take 1 tablet (15 mg total) by mouth daily. 10/12/21  Yes Juliana Boling, Ines Bloomer, MD  Minoxidil (ROGAINE MENS EXTRA STRENGTH) 5 % FOAM    Yes [provider]  Multiple Vitamins-Minerals (MULTIVITAMIN ADULT PO) Take by mouth daily.   Yes [provider]  simvastatin (ZOCOR) 40 MG tablet PLEASE SEE ATTACHED FOR DETAILED DIRECTIONS 10/28/21  Yes Richardo Priest, MD  Artificial Tear Ointment (DRY EYES OP) Apply to eye as needed.    [provider]  dicyclomine (BENTYL) 10 MG capsule TAKE 1 CAPSULE (10 MG TOTAL) BY MOUTH 3 (THREE) TIMES DAILY AS NEEDED FOR SPASMS. Patient not taking: Reported on 09/13/2022 03/04/22   Mauri Pole, MD    No Known Allergies  Patient Active Problem List   Diagnosis Date Noted   Nocturia 10/25/2021   Neuropathy  10/25/2021   Insomnia 10/25/2021   History of blood transfusion 10/25/2021   Heart murmur 10/25/2021   Diverticulosis 10/25/2021   Depression 10/25/2021   Cancer ([place]) 10/25/2021   Arthritis 10/25/2021   Aortic regurgitation 10/25/2021   Anemia [id number]   Monoallelic mutation of SDHA gene 06/25/2021   History of colonic polyps 05/21/2021   Atherosclerosis of aorta ([place]) 03/08/2021   Steatosis of liver 03/08/2021   Prediabetes 03/06/2020   Abnormal echocardiogram 05/08/2019   Enlarged thoracic aorta ([place]) 04/17/2019   Elevated cholesterol 02/14/2018   Status post bilateral total hip replacement 05/27/2014   ANXIETY DISORDER- mixed disorder w/ depressive symptoms 02/07/2014   PERIPHERAL NEUROPATHY 10/13/2007   Essential hypertension 123XX123   HERNIA, UMBILICAL [id number]   COLONIC POLYPS, ADENOMATOUS 02/20/2003   DIVERTICULOSIS, COLON 02/20/2003   History of malignant neoplasm of large intestine 02/20/2003   Rectosigmoid cancer ([place]) 2004    Past Medical History:  Diagnosis Date   Anemia    hx of, not at this time   ANXIETY DISORDER- mixed disorder w/ depressive symptoms 02/07/2014   Aortic regurgitation    Arthritis    "right hip; left hip; most of my joints" (05/27/2014)   Blood transfusion without reported diagnosis    [employer]rees[employer] 09/04/2020   Cancer ([place])    [facility] 09/04/2020   Depression    Diverticulosis    Essential hypertension, benign    takes Lisinopril-HCTZ daily   Heart murmur  High cholesterol    takes Pravastatin daily   History of blood transfusion    no abnormal reaction noted 2015   History of colon polyps    benign   History of colonic polyps 05/21/2021   Hyperlipidemia    [employer]ia 09/04/2020   Hypertension    [employer]ia 09/04/2020   Insomnia    doesn't take any meds   Joint pain    Monoallelic mutation of SDHA gene 06/25/2021   Neuromuscular disorder ([place])    Neuropathy    since chemo and radiation   Nocturia    PONV (postoperative  nausea and vomiting)    hallucinations   Rectosigmoid cancer ([place]) 2004   S/P Surgery, Chemo, Rad Tx,  (after chemo treatment menstral periods have ceased).    Past Surgical History:  Procedure Laterality Date   ANAL RECTAL MANOMETRY N/A 07/17/2017   Procedure: ANO RECTAL MANOMETRY;  Surgeon: Mauri Pole, MD;  Location: [facility];  Service: Endoscopy;  Laterality: N/A;   COLECTOMY  Aug 2004   S/P Low Anterior Resxn (Sigmoid Colon)   COLON SURGERY N/A    [employer] 09/04/2020   COLONOSCOPY     DILATION AND CURETTAGE OF UTERUS  1991   JOINT REPLACEMENT N/A    [employer]ia 09/04/2020   LUMBAR [place] SURGERY  Feb 2007   SPINE SURGERY N/A    [facility] 09/04/2020   TOTAL HIP ARTHROPLASTY Right 05/27/2014   Procedure: RIGHT TOTAL HIP ARTHROPLASTY ANTERIOR APPROACH;  Surgeon: Hessie Dibble, MD;  Location: [place];  Service: Orthopedics;  Laterality: Right;   TOTAL HIP ARTHROPLASTY Left 08/25/2015   Procedure: TOTAL HIP ARTHROPLASTY ANTERIOR APPROACH;  Surgeon: Melrose Nakayama, MD;  Location: [place] [facility];  Service: Orthopedics;  Laterality: Left;    Social History   Socioeconomic History   Marital status: Married    Spouse name: Nicole Kindred   Number of children: 1   Years of education: Not on file   Highest education level: Not on file  Occupational History   Occupation: office work  Tobacco Use   Smoking status: Former    Types: Cigarettes    Passive exposure: Past   Smokeless tobacco: Never   Tobacco comments:    "smoked some socially in my college days"  Vaping Use   Vaping Use: Never used  Substance and Sexual Activity   Alcohol use: Yes    Comment: occasionally beer/wine   Drug use: No   Sexual activity: Yes    Partners: Male    Birth control/protection: None  Other Topics Concern   Not on file  Social History Narrative   Lives with husband   Exercise walking 2 x weekly   Patient's family history -  UNKNOWN ADOPTED   Social Determinants of Health   Financial Resource  Strain: Not on file  Food Insecurity: Not on file  Transportation Needs: Not on file  Physical Activity: Not on file  Stress: Not on file  Social Connections: Not on file  Intimate Partner Violence: Not on file    Family History  Adopted: Yes  Problem Relation Age of Onset   Colitis Neg Hx    Colon cancer Neg Hx    Colon polyps Neg Hx    Esophageal cancer Neg Hx    Rectal cancer Neg Hx    Stomach cancer Neg Hx      Review of Systems  Constitutional: Negative.  Negative for chills and fever.  HENT: Negative.  Negative for congestion and sore throat.  Respiratory: Negative.  Negative for cough and shortness of breath.   Cardiovascular: Negative.  Negative for chest pain and palpitations.  Gastrointestinal:  Negative for abdominal pain, constipation, diarrhea, nausea and vomiting.  Genitourinary: Negative.  Negative for dysuria and hematuria.  Skin: Negative.  Negative for rash.  Neurological: Negative.  Negative for dizziness and headaches.  All other systems reviewed and are negative.  Today's Vitals   09/13/22 1454  BP: 118/68  Pulse: 63  Temp: 97.8 F (36.6 C)  TempSrc: Oral  SpO2: 99%  Weight: 148 lb 8 oz (67.4 kg)  Height: '5\' 3"'$  (1.6 m)   Body mass index is 26.31 kg/m.   Physical Exam Vitals reviewed.  Constitutional:      Appearance: Normal appearance.  HENT:     Head: Normocephalic.     Mouth/Throat:     Mouth: Mucous membranes are moist.     Pharynx: Oropharynx is clear.  Eyes:     Extraocular Movements: Extraocular movements intact.     Conjunctiva/sclera: Conjunctivae normal.     Pupils: Pupils are equal, round, and reactive to light.  Cardiovascular:     Rate and Rhythm: Normal rate and regular rhythm.     Pulses: Normal pulses.     Heart sounds: Murmur heard.  Pulmonary:     Effort: Pulmonary effort is normal.     Breath sounds: Normal breath sounds.  Musculoskeletal:     Cervical back: No tenderness.     Right lower leg: No edema.      Left lower leg: No edema.  Lymphadenopathy:     Cervical: No cervical adenopathy.  Skin:    General: Skin is warm and dry.  Neurological:     General: No focal deficit present.     Mental Status: She is alert and oriented to person, place, and time.  Psychiatric:        Mood and Affect: Mood normal.        Behavior: Behavior normal.      ASSESSMENT & PLAN: A total of 43 minutes was spent with the patient and counseling/coordination of care regarding preparing for this visit, review of most recent office visit notes, review of multiple chronic medical conditions and their management, review of all medications, review of most recent blood work results, education on nutrition, cardiovascular risks associated with hypertension and dyslipidemia, prognosis, review of health maintenance items, documentation, and need for follow-up.  Problem List Items Addressed This Visit       Cardiovascular and Mediastinum   Essential hypertension - Primary    Well-controlled hypertension. Continue lisinopril 40 mg daily. BP Readings from Last 3 Encounters:  09/13/22 118/68  09/07/22 120/60  05/12/22 (!) 101/48        Relevant Orders   Urinalysis   CBC with Differential/Platelet   Enlarged thoracic aorta (HCC)    Stable.  Continues surveillance by cardiology      Atherosclerosis of aorta ([place])    Diet and nutrition discussed.  Continue simvastatin 40 mg daily.        Digestive   Steatosis of liver    Diet and nutrition discussed.  Continue simvastatin 40 mg daily.        Nervous and Auditory   Neuropathy    Stable.  Continues gabapentin 400 mg at bedtime      Relevant Orders   CBC with Differential/Platelet     Other   Prediabetes    Diet and nutrition discussed.  Hemoglobin A1c done today.  Relevant Orders   CBC with Differential/Platelet   Comprehensive metabolic panel   Hemoglobin A1c   Dyslipidemia    Stable.  Diet and nutrition discussed.  Continues simvastatin  40 mg daily. The 10-year ASCVD risk score (Arnett DK, et al., 2019) is: 6%   Values used to calculate the score:     Age: 27 years     Sex: Female     Is Non-Hispanic African American: No     Diabetic: No     Tobacco smoker: No     Systolic Blood Pressure: 123456 mmHg     Is BP treated: Yes     HDL Cholesterol: 68 mg/dL     Total Cholesterol: 167 mg/dL       Relevant Orders   Comprehensive metabolic panel   Hemoglobin A1c   Lipid panel   Patient Instructions  Health Maintenance After Age 60 After age 35, you are at a higher risk for certain long-term diseases and infections as well as injuries from falls. Falls are a major cause of broken bones and head injuries in people who are older than age 8. Getting regular preventive care can help to keep you healthy and well. Preventive care includes getting regular testing and making lifestyle changes as recommended by your health care provider. Talk with your health care provider about: Which screenings and tests you should have. A screening is a test that checks for a disease when you have no symptoms. A diet and exercise plan that is right for you. What should I know about screenings and tests to prevent falls? Screening and testing are the best ways to find a health problem early. Early diagnosis and treatment give you the best chance of managing medical conditions that are common after age 86. Certain conditions and lifestyle choices may make you more likely to have a fall. Your health care provider may recommend: Regular vision checks. Poor vision and conditions such as cataracts can make you more likely to have a fall. If you wear glasses, make sure to get your prescription updated if your vision changes. Medicine review. Work with your health care provider to regularly review all of the medicines you are taking, including over-the-counter medicines. Ask your health care provider about any side effects that may make you more likely to have a  fall. Tell your health care provider if any medicines that you take make you feel dizzy or sleepy. Strength and balance checks. Your health care provider may recommend certain tests to check your strength and balance while standing, walking, or changing positions. Foot health exam. Foot pain and numbness, as well as not wearing proper footwear, can make you more likely to have a fall. Screenings, including: Osteoporosis screening. Osteoporosis is a condition that causes the bones to get weaker and break more easily. Blood pressure screening. Blood pressure changes and medicines to control blood pressure can make you feel dizzy. Depression screening. You may be more likely to have a fall if you have a fear of falling, feel depressed, or feel unable to do activities that you used to do. Alcohol use screening. Using too much alcohol can affect your balance and may make you more likely to have a fall. Follow these instructions at home: Lifestyle Do not drink alcohol if: Your health care provider tells you not to drink. If you drink alcohol: Limit how much you have to: 0-1 drink a day for women. 0-2 drinks a day for men. Know how much alcohol is in  your drink. In the [place] one drink equals one 12 oz bottle of beer (355 mL), one 5 oz glass of wine (148 mL), or one 1 oz glass of hard liquor (44 mL). Do not use any products that contain nicotine or tobacco. These products include cigarettes, chewing tobacco, and vaping devices, such as e-cigarettes. If you need help quitting, ask your health care provider. Activity  Follow a regular exercise program to stay fit. This will help you maintain your balance. Ask your health care provider what types of exercise are appropriate for you. If you need a cane or walker, use it as recommended by your health care provider. Wear supportive shoes that have nonskid soles. Safety  Remove any tripping hazards, such as rugs, cords, and clutter. Install safety  equipment such as grab bars in bathrooms and safety rails on stairs. Keep rooms and walkways well-lit. General instructions Talk with your health care provider about your risks for falling. Tell your health care provider if: You fall. Be sure to tell your health care provider about all falls, even ones that seem minor. You feel dizzy, tiredness (fatigue), or off-balance. Take over-the-counter and prescription medicines only as told by your health care provider. These include supplements. Eat a healthy diet and maintain a healthy weight. A healthy diet includes low-fat dairy products, low-fat (lean) meats, and fiber from whole grains, beans, and lots of fruits and vegetables. Stay current with your vaccines. Schedule regular health, dental, and eye exams. Summary Having a healthy lifestyle and getting preventive care can help to protect your health and wellness after age 77. Screening and testing are the best way to find a health problem early and help you avoid having a fall. Early diagnosis and treatment give you the best chance for managing medical conditions that are more common for people who are older than age 43. Falls are a major cause of broken bones and head injuries in people who are older than age 52. Take precautions to prevent a fall at home. Work with your health care provider to learn what changes you can make to improve your health and wellness and to prevent falls. This information is not intended to replace advice given to you by your health care provider. Make sure you discuss any questions you have with your health care provider. Document Revised: 11/16/2020 Document Reviewed: 11/16/2020 Elsevier Patient Education  [place], MD [facility]

## 2022-09-14 ENCOUNTER — Other Ambulatory Visit: Payer: Self-pay | Admitting: Emergency Medicine

## 2022-09-14 DIAGNOSIS — E878 Other disorders of electrolyte and fluid balance, not elsewhere classified: Secondary | ICD-10-CM

## 2022-09-14 DIAGNOSIS — E871 Hypo-osmolality and hyponatremia: Secondary | ICD-10-CM

## 2022-09-15 ENCOUNTER — Encounter: Payer: Self-pay | Admitting: Cardiology

## 2022-09-15 ENCOUNTER — Other Ambulatory Visit: Payer: Self-pay

## 2022-09-15 DIAGNOSIS — I7121 Aneurysm of the ascending aorta, without rupture: Secondary | ICD-10-CM

## 2022-09-21 ENCOUNTER — Telehealth (HOSPITAL_BASED_OUTPATIENT_CLINIC_OR_DEPARTMENT_OTHER): Payer: Self-pay

## 2022-10-05 ENCOUNTER — Telehealth (HOSPITAL_BASED_OUTPATIENT_CLINIC_OR_DEPARTMENT_OTHER): Payer: Self-pay

## 2022-10-14 ENCOUNTER — Ambulatory Visit (HOSPITAL_BASED_OUTPATIENT_CLINIC_OR_DEPARTMENT_OTHER)
Admission: RE | Admit: 2022-10-14 | Discharge: 2022-10-14 | Disposition: A | Discharge status: Home / Self Care | Payer: 59 | Source: Ambulatory Visit | Attending: Cardiology | Admitting: Cardiology

## 2022-10-14 DIAGNOSIS — I7121 Aneurysm of the ascending aorta, without rupture: Secondary | ICD-10-CM | POA: Diagnosis present

## 2022-10-14 MED ORDER — IOHEXOL 350 MG/ML SOLN
100.0000 mL | Freq: Once | INTRAVENOUS | Status: AC | PRN
  Administered 2022-10-14: 100 mL via INTRAVENOUS

## 2022-10-17 ENCOUNTER — Other Ambulatory Visit: Payer: Self-pay

## 2022-10-17 DIAGNOSIS — I7121 Aneurysm of the ascending aorta, without rupture: Secondary | ICD-10-CM

## 2022-10-25 NOTE — Therapy (Addendum)
OUTPATIENT PHYSICAL THERAPY FEMALE PELVIC EVALUATION   Patient Name: [name] [name] MRN: [id number] DOB:[birth year], 67 y.o., female Today's Date: 10/25/2022  END OF SESSION:   Outpatient Rehab from 10/26/2022 in [facility]    10/26/2022   1541  PT Visits / Re-Eval   Visit Number 1  Number of Visits --  Date for PT Re-Evaluation 01/18/2023  Authorization   Authorization Type U[employer]  Authorization Time Period --  Authorization - Visit Number --  Authorization - Number of Visits --  Progress Note Due on Visit --  PT Time Calculation   PT Start Time 1530  PT Stop Time 1610  PT Time Calculation (min) 40 min  PT - End of Session   Equipment Utilized During Treatment --  Activity Tolerance Patient tolerated treatment well  Behavior During Therapy [facility] for tasks assessed/performed  Eulis Foster, PT 12/09/22 1:25 PM   Past Medical History:  Diagnosis Date   Anemia    hx of, not at this time   ANXIETY DISORDER- mixed disorder w/ depressive symptoms 02/07/2014   Aortic regurgitation    Arthritis    "right hip; left hip; most of my joints" (05/27/2014)   Blood transfusion without reported diagnosis    Phreesia 09/04/2020   Cancer (HCC)    Phreesia 09/04/2020   Depression    Diverticulosis    Essential hypertension, benign    takes Lisinopril-HCTZ daily   Heart murmur    High cholesterol    takes Pravastatin daily   History of blood transfusion    no abnormal reaction noted 2015   History of colon polyps    benign   History of colonic polyps 05/21/2021   Hyperlipidemia    Phreesia 09/04/2020   Hypertension    Phreesia 09/04/2020   Insomnia    doesn't take any meds   Joint pain    Monoallelic mutation of SDHA gene 06/25/2021   Neuromuscular disorder (HCC)    Neuropathy    since chemo and radiation   Nocturia    PONV (postoperative nausea and vomiting)    hallucinations   Rectosigmoid cancer (HCC) 2004   S/P Surgery, Chemo, Rad Tx,   (after chemo treatment menstral periods have ceased).   Past Surgical History:  Procedure Laterality Date   ANAL RECTAL MANOMETRY N/A 07/17/2017   Procedure: ANO RECTAL MANOMETRY;  Surgeon: Napoleon Form, MD;  Location: [facility];  Service: Endoscopy;  Laterality: N/A;   COLECTOMY  Aug 2004   S/P Low Anterior Resxn (Sigmoid Colon)   COLON SURGERY N/A    Phreesia 09/04/2020   COLONOSCOPY     DILATION AND CURETTAGE OF UTERUS  1991   JOINT REPLACEMENT N/A    Phreesia 09/04/2020   LUMBAR DISC SURGERY  Feb 2007   SPINE SURGERY N/A    Phreesia 09/04/2020   TOTAL HIP ARTHROPLASTY Right 05/27/2014   Procedure: RIGHT TOTAL HIP ARTHROPLASTY ANTERIOR APPROACH;  Surgeon: Velna Ochs, MD;  Location: [facility] OR;  Service: Orthopedics;  Laterality: Right;   TOTAL HIP ARTHROPLASTY Left 08/25/2015   Procedure: TOTAL HIP ARTHROPLASTY ANTERIOR APPROACH;  Surgeon: Marcene Corning, MD;  Location: [facility] OR;  Service: Orthopedics;  Laterality: Left;   Patient Active Problem List   Diagnosis Date Noted   Nocturia 10/25/2021   Neuropathy 10/25/2021   Insomnia 10/25/2021   History of blood transfusion 10/25/2021   Heart murmur 10/25/2021   Diverticulosis 10/25/2021   Depression 10/25/2021   Cancer 10/25/2021   Arthritis 10/25/2021  Aortic regurgitation 10/25/2021   Anemia 10/25/2021   Monoallelic mutation of SDHA gene 06/25/2021   History of colonic polyps 05/21/2021   Atherosclerosis of aorta 03/08/2021   Steatosis of liver 03/08/2021   Dyslipidemia 03/08/2021   Prediabetes 03/06/2020   Abnormal echocardiogram 05/08/2019   Enlarged thoracic aorta 04/17/2019   Elevated cholesterol 02/14/2018   Status post bilateral total hip replacement 05/27/2014   ANXIETY DISORDER- mixed disorder w/ depressive symptoms 02/07/2014   PERIPHERAL NEUROPATHY 10/13/2007   Essential hypertension 10/13/2007   HERNIA, UMBILICAL 10/13/2007   COLONIC POLYPS, ADENOMATOUS 02/20/2003   DIVERTICULOSIS, COLON 02/20/2003    History of malignant neoplasm of large intestine 02/20/2003   Rectosigmoid cancer 2004    PCP: Georgina Quint, MD  REFERRING PROVIDER: Napoleon Form, MD   REFERRING DIAG: [phone] (ICD-10-CM) - Constipation, outlet dysfunctionM62.89 (ICD-10-CM) - Pelvic floor dysfunctionM54.9,G89.29 (ICD-10-CM) - Other chronic back painM25.551,M25.552 (ICD-10-CM) - Bilateral hip pain  THERAPY DIAG:  No diagnosis found.  Rationale for Evaluation and Treatment: Rehabilitation  ONSET DATE: 2015  SUBJECTIVE:                                                                                                                                                                                           SUBJECTIVE STATEMENT: Patient is presently have PT for the right hip. MD is taking a higher level of Linzess with Benefiber. Patient goes from constipation to diarrhea. Patient will take immodium at times when going out. The stool issue makes it difficult to go places. Patient is in the bathroom several times for long periods. Patient is using the squatty potty and the breathing.   PAIN:  Are you having pain? No, not in pelvic floor   PRECAUTIONS: Other: colon cancer  WEIGHT BEARING RESTRICTIONS: No  FALLS:  Has patient fallen in last 6 months? No  LIVING ENVIRONMENT: Lives with: lives with their spouse   OCCUPATION: works from home  PLOF: Independent  PATIENT GOALS: normalize her stool to not affect her live   PERTINENT HISTORY:  Bil. Hip replacement; colon cancer; colon resection  BOWEL MOVEMENT: Pain with bowel movement: No Type of bowel movement:Type (Bristol Stool Scale) varies from Type 1-Type 7, Frequency 12 times the day she has a bowel movement, Strain Yes, and Splinting yes Fully empty rectum: No, will go several times with a small amount of stool Leakage: Yes: the diarrhea  and will make sure she is close to the bathroom Pads: No Fiber supplement: Yes:  benefiber  URINATION: Pain with urination: No Fully empty bladder: No, after urination may feel like she has to urinate again and may take  3 times Stream:  average Urgency: Yes: sometimes Frequency: 5 during the day, 1 time at night Leakage: Coughing and Laughing Pads: no  INTERCOURSE:not active ; in past was painful  after menopause  PREGNANCY: Vaginal deliveries 1  Tearing No     OBJECTIVE:   DIAGNOSTIC FINDINGS:  none   COGNITION: Overall cognitive status: Within functional limits for tasks assessed      POSTURE: rounded shoulders, forward head, and increased thoracic kyphosis  PELVIC ALIGNMENT:  LUMBARAROM/PROM:  A/PROM A/PROM  eval  Flexion full  Extension Decreased by 50%  Right lateral flexion Decreased by 25%  Left lateral flexion Decreased by 25%  Right rotation Decreased by 25%  Left rotation Decreased by 25%   (Blank rows = not tested)  LOWER EXTREMITY ROM:  Passive ROM Right eval Left eval  Hip external rotation 45 50   (Blank rows = not tested)    PALPATION:   General  rib angle greater than 90 degrees; Difficulty with bringing the lower rib cage together on exhalation; not able to contract the lower abdomen instead will pelvic tilt. Tenderness located in the right lower quadrant. Restrictions around the lower abdominal scars.                 External Perineal Exam stool was on the outside of the rectum and inside the rectum                             Internal Pelvic Floor tightness in the anterior rectum, along the right iliococcygeus and obturator internist, tightness at the anococcygeal ligament, difficulty with contracting anal sphincter on the right  Patient confirms identification and approves PT to assess internal pelvic floor and treatment Yes  PELVIC MMT:   MMT eval  Internal Anal Sphincter 2/5  External Anal Sphincter 2/5  Puborectalis 2/5  (Blank rows = not tested)        TONE: Increased on the right  TODAY'S  TREATMENT:                                                                                                                              DATE: 10/26/22  EVAL see below   PATIENT EDUCATION:  Education details: educated patient on what will be done in therapy, reviewed goals with therapy, discussed the techniques we will do Person educated: Patient Education method: Explanation Education comprehension: verbalized understanding  HOME EXERCISE PROGRAM: See above.  ASSESSMENT:  CLINICAL IMPRESSION: Patient is a 67 y.o. female who was seen today for physical therapy evaluation and treatment for constipation, pelvic floor dysfunction. Patient back and hip pain is being addressed at another pelvic floor facility. Patient reports her stool going from Type 1-7. Patient will strain and splint to have a bowel movement. She will go up to 12 times in one day when she has a bowel movement. She does have stool leakage which occurred during  therapy today. Pelvic floor strength is 2/5 with less contraction on the right. She has tightness in the anterior rectum and right pelvic floor muscles. Decreased mobility of the anococcygeal ligament. She was not able to push the therapist finger out of the rectum. She has stool in the rectum during the exam. Patient lower rib angle is greater than 90 degrees and she has difficulty with reducing the angle. She has minimal to no abdominal contraction. She has abdominal restrictions especially around the scars.   OBJECTIVE IMPAIRMENTS: decreased coordination, decreased endurance, decreased strength, increased fascial restrictions, and increased muscle spasms.   ACTIVITY LIMITATIONS: continence, toileting, and locomotion level  PARTICIPATION LIMITATIONS: interpersonal relationship and community activity  PERSONAL FACTORS: Age, Fitness, Time since onset of injury/illness/exacerbation, and 3+ comorbidities: Bil. Hip replacement; colon cancer; colon resection  are also affecting  patient's functional outcome.   REHAB POTENTIAL: Good  CLINICAL DECISION MAKING: Evolving/moderate complexity  EVALUATION COMPLEXITY: Moderate   GOALS: Goals reviewed with patient? Yes  SHORT TERM GOALS: Target date: 11/21/22  Patient has reduction of tightness in the pelvic floor muscles so she is able to contract with a full circle.  Baseline: Goal status: INITIAL  2.  Patient is able to engage the abdominals to increase pressure to have a bowel movement.  Baseline:  Goal status: INITIAL  3.  Patient is able to perform diaphragmatic breathing due to improved lower rib cage movement.  Baseline:  Goal status: INITIAL    LONG TERM GOALS: Target date: 01/18/23  Patient is independent with advanced HEP for pelvic floor and core.  Baseline:  Goal status: INITIAL  2.  Patient is able to have a bowel movement without straining >/= 80% of the time.  Baseline:  Goal status: INITIAL  3.  Patient is able to fully empty her rectum so her stool leakage is reduced >/= 70%.  Baseline:  Goal status: INITIAL  4.  Patient is able to go out with minimal use of immodium to reduce the amount of stool leakage number of times to toilet. Baseline:  Goal status: INITIAL  5.  Patient reports she has 5 or less bowel movements per day due to improved emptying of the stool.  Baseline:  Goal status: INITIAL  6.  Patient is able to push the therapist finger out of the anal canal due to improved coordination of the pelvic floor muscles.  Baseline:  Goal status: INITIAL  PLAN:  PT FREQUENCY: 1x/week  PT DURATION: 12 weeks  PLANNED INTERVENTIONS: Therapeutic exercises, Therapeutic activity, Neuromuscular re-education, Patient/Family education, Joint mobilization, Dry Needling, Electrical stimulation, Taping, Biofeedback, and Manual therapy  PLAN FOR NEXT SESSION: manual work on the abdomen and diaphragm, diaphragmatic breathing, contraction of the abdomen   Eulis Foster, PT 10/26/22  5:12 PM

## 2022-10-26 ENCOUNTER — Encounter: Payer: Self-pay | Admitting: Physical Therapy

## 2022-10-26 ENCOUNTER — Ambulatory Visit: Payer: 59 | Attending: Gastroenterology | Admitting: Physical Therapy

## 2022-10-26 ENCOUNTER — Other Ambulatory Visit: Payer: Self-pay

## 2022-10-26 DIAGNOSIS — R278 Other lack of coordination: Secondary | ICD-10-CM | POA: Diagnosis present

## 2022-10-26 DIAGNOSIS — M6281 Muscle weakness (generalized): Secondary | ICD-10-CM | POA: Diagnosis present

## 2022-10-29 ENCOUNTER — Other Ambulatory Visit: Payer: Self-pay | Admitting: Emergency Medicine

## 2022-10-29 DIAGNOSIS — I10 Essential (primary) hypertension: Secondary | ICD-10-CM

## 2022-10-30 ENCOUNTER — Other Ambulatory Visit: Payer: Self-pay | Admitting: Cardiology

## 2022-10-31 ENCOUNTER — Other Ambulatory Visit: Payer: Self-pay

## 2022-10-31 MED ORDER — SIMVASTATIN 40 MG PO TABS: ORAL_TABLET | ORAL | 0 refills | Status: DC

## 2022-10-31 MED ORDER — SIMVASTATIN 40 MG PO TABS: 40.0000 mg | ORAL_TABLET | Freq: Every day | ORAL | 0 refills | Status: DC

## 2022-10-31 NOTE — Telephone Encounter (Signed)
Rx to pharmacy, patient needs appointment for future refills / 1st attempt

## 2022-12-12 ENCOUNTER — Ambulatory Visit: Payer: 59 | Attending: Gastroenterology | Admitting: Physical Therapy

## 2022-12-12 ENCOUNTER — Encounter: Payer: Self-pay | Admitting: Physical Therapy

## 2022-12-12 DIAGNOSIS — R252 Cramp and spasm: Secondary | ICD-10-CM | POA: Insufficient documentation

## 2022-12-12 DIAGNOSIS — R278 Other lack of coordination: Secondary | ICD-10-CM | POA: Diagnosis present

## 2022-12-12 DIAGNOSIS — K5900 Constipation, unspecified: Secondary | ICD-10-CM | POA: Diagnosis present

## 2022-12-12 DIAGNOSIS — M6281 Muscle weakness (generalized): Secondary | ICD-10-CM | POA: Diagnosis present

## 2022-12-12 NOTE — Therapy (Signed)
OUTPATIENT PHYSICAL THERAPY FEMALE PELVIC TREATMENT   Patient Name: [name] [name] MRN: [id number] DOB:11-Jul-1956, 67 y.o., female Today's Date: 12/12/2022  END OF SESSION:  PT End of Session - 12/12/22 1233     Visit Number 2    Date for PT Re-Evaluation 01/18/23    Authorization Type UHC    PT Start Time 1230    PT Stop Time 1308    PT Time Calculation (min) 38 min    Activity Tolerance Patient tolerated treatment well    Behavior During Therapy WFL for tasks assessed/performed               Past Medical History:  Diagnosis Date   Anemia    hx of, not at this time   ANXIETY DISORDER- mixed disorder w/ depressive symptoms 02/07/2014   Aortic regurgitation    Arthritis    "right hip; left hip; most of my joints" (05/27/2014)   Blood transfusion without reported diagnosis    Phreesia 09/04/2020   Cancer (HCC)    Phreesia 09/04/2020   Depression    Diverticulosis    Essential hypertension, benign    takes Lisinopril-HCTZ daily   Heart murmur    High cholesterol    takes Pravastatin daily   History of blood transfusion    no abnormal reaction noted 2015   History of colon polyps    benign   History of colonic polyps 05/21/2021   Hyperlipidemia    Phreesia 09/04/2020   Hypertension    Phreesia 09/04/2020   Insomnia    doesn't take any meds   Joint pain    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   Past Surgical History:  Procedure Laterality Date   ANAL RECTAL MANOMETRY N/A 07/17/2017   Procedure: ANO RECTAL MANOMETRY;  Surgeon: Napoleon Form, MD;  Location: [facility];  Service: Endoscopy;  Laterality: N/A;   COLECTOMY  Aug 2004   S/P Low Anterior Resxn (Sigmoid Colon)   COLON  SURGERY N/A    Phreesia 09/04/2020   COLONOSCOPY     DILATION AND CURETTAGE OF UTERUS  1991   JOINT REPLACEMENT N/A    Phreesia 09/04/2020   LUMBAR DISC SURGERY  Feb 2007   SPINE SURGERY N/A    Phreesia 09/04/2020   TOTAL HIP ARTHROPLASTY Right 05/27/2014   Procedure: RIGHT TOTAL HIP ARTHROPLASTY ANTERIOR APPROACH;  Surgeon: Velna Ochs, MD;  Location: [facility];  Service: Orthopedics;  Laterality: Right;   TOTAL HIP ARTHROPLASTY Left 08/25/2015   Procedure: TOTAL HIP ARTHROPLASTY ANTERIOR APPROACH;  Surgeon: Marcene Corning, MD;  Location: [facility];  Service: Orthopedics;  Laterality: Left;   Patient Active Problem List   Diagnosis Date Noted   Nocturia 10/25/2021   Neuropathy 10/25/2021   Insomnia 10/25/2021   History of blood transfusion 10/25/2021   Heart murmur 10/25/2021   Diverticulosis 10/25/2021   Depression 10/25/2021   Cancer (HCC) 10/25/2021   Arthritis 10/25/2021   Aortic regurgitation 10/25/2021   Anemia 10/25/2021   Monoallelic mutation of SDHA gene 06/25/2021   History of colonic polyps 05/21/2021   Atherosclerosis of aorta (HCC) 03/08/2021   Steatosis of liver 03/08/2021   Dyslipidemia 03/08/2021   Prediabetes 03/06/2020  Abnormal echocardiogram 05/08/2019   Enlarged thoracic aorta (HCC) 04/17/2019   Elevated cholesterol 02/14/2018   Status post bilateral total hip replacement 05/27/2014   ANXIETY DISORDER- mixed disorder w/ depressive symptoms 02/07/2014   PERIPHERAL NEUROPATHY 10/13/2007   Essential hypertension 10/13/2007   HERNIA, UMBILICAL 10/13/2007   COLONIC POLYPS, ADENOMATOUS 02/20/2003   DIVERTICULOSIS, COLON 02/20/2003   History of malignant neoplasm of large intestine 02/20/2003   Rectosigmoid cancer (HCC) 2004    PCP: Georgina Quint, MD  REFERRING PROVIDER: Napoleon Form, MD   REFERRING DIAG: [phone] (ICD-10-CM) - Constipation, outlet dysfunctionM62.89 (ICD-10-CM) - Pelvic floor dysfunctionM54.9,G89.29 (ICD-10-CM) - Other  chronic back painM25.551,M25.552 (ICD-10-CM) - Bilateral hip pain  THERAPY DIAG:  Muscle weakness (generalized)  Other lack of coordination  Cramp and spasm  Constipation, unspecified constipation type  Rationale for Evaluation and Treatment: Rehabilitation  ONSET DATE: 2015  SUBJECTIVE:                                                                                                                                                                                           SUBJECTIVE STATEMENT: I took 2 immodium prior to appt. I have been having diarrhea today. The bowel issues puts me off from going places.   PAIN:  Are you having pain? No, not in pelvic floor   PRECAUTIONS: Other: colon cancer  WEIGHT BEARING RESTRICTIONS: No  FALLS:  Has patient fallen in last 6 months? No  LIVING ENVIRONMENT: Lives with: lives with their spouse   OCCUPATION: works from home  PLOF: Independent  PATIENT GOALS: normalize her stool to not affect her live   PERTINENT HISTORY:  Bil. Hip replacement; colon cancer; colon resection  BOWEL MOVEMENT: Pain with bowel movement: No Type of bowel movement:Type (Bristol Stool Scale) varies from Type 1-Type 7, Frequency 12 times the day she has a bowel movement, Strain Yes, and Splinting yes Fully empty rectum: No, will go several times with a small amount of stool Leakage: Yes: the diarrhea  and will make sure she is close to the bathroom Pads: No Fiber supplement: Yes: benefiber  URINATION: Pain with urination: No Fully empty bladder: No, after urination may feel like she has to urinate again and may take 3 times Stream:  average Urgency: Yes: sometimes Frequency: 5 during the day, 1 time at night Leakage: Coughing and Laughing Pads: no  INTERCOURSE:not active ; in past was painful  after menopause  PREGNANCY: Vaginal deliveries 1  Tearing No     OBJECTIVE:   DIAGNOSTIC FINDINGS:  none   COGNITION: Overall cognitive  status: Within functional limits for tasks assessed  POSTURE: rounded shoulders, forward head, and increased thoracic kyphosis  PELVIC ALIGNMENT:  LUMBARAROM/PROM:  A/PROM A/PROM  eval  Flexion full  Extension Decreased by 50%  Right lateral flexion Decreased by 25%  Left lateral flexion Decreased by 25%  Right rotation Decreased by 25%  Left rotation Decreased by 25%   (Blank rows = not tested)  LOWER EXTREMITY ROM:  Passive ROM Right eval Left eval  Hip external rotation 45 50   (Blank rows = not tested)    PALPATION:   General  rib angle greater than 90 degrees; Difficulty with bringing the lower rib cage together on exhalation; not able to contract the lower abdomen instead will pelvic tilt. Tenderness located in the right lower quadrant. Restrictions around the lower abdominal scars.                 External Perineal Exam stool was on the outside of the rectum and inside the rectum                             Internal Pelvic Floor tightness in the anterior rectum, along the right iliococcygeus and obturator internist, tightness at the anococcygeal ligament, difficulty with contracting anal sphincter on the right  Patient confirms identification and approves PT to assess internal pelvic floor and treatment Yes  PELVIC MMT:   MMT eval  Internal Anal Sphincter 2/5  External Anal Sphincter 2/5  Puborectalis 2/5  (Blank rows = not tested)        TONE: Increased on the right  TODAY'S TREATMENT:     12/12/22 Manual: Soft tissue mobilization: Abdominal massage to promote peristalic motion of the intestines Educated patient on abdominal massage to do at home.  Scar tissue mobilization: Along the midline of the lower abdomen to release the restrictions Myofascial release: Fascial release of the mesenteric root, along the ileocecal valve, along the suprapubic area, release of the sac of douglas, and release of the right lateral abdominal  area Exercises: Stretches/mobility: Educated patient on filling out the bowel diary to see if certain foods affect her stool  PATIENT EDUCATION: Education details: abdominal massage and how to fill out the bowel diary Person educated: Patient Education method: Chief Technology Officer Education comprehension: verbalized understanding   HOME EXERCISE PROGRAM: See above.  ASSESSMENT:  CLINICAL IMPRESSION: Patient is a 67 y.o. female who was seen today for physical therapy  treatment for constipation, pelvic floor dysfunction. Today patient was having diarrhea and abdominal discomfort. Therapist heard good intestinal sounds during the manual work. She has tightness and soreness throughout the abdomen. She will fill out the bowel diary to see if certain foods change the stool Type. Patient will benefit from skilled therapy to improve pelvic floor coordination.   OBJECTIVE IMPAIRMENTS: decreased coordination, decreased endurance, decreased strength, increased fascial restrictions, and increased muscle spasms.   ACTIVITY LIMITATIONS: continence, toileting, and locomotion level  PARTICIPATION LIMITATIONS: interpersonal relationship and community activity  PERSONAL FACTORS: Age, Fitness, Time since onset of injury/illness/exacerbation, and 3+ comorbidities: Bil. Hip replacement; colon cancer; colon resection  are also affecting patient's functional outcome.   REHAB POTENTIAL: Good  CLINICAL DECISION MAKING: Evolving/moderate complexity  EVALUATION COMPLEXITY: Moderate   GOALS: Goals reviewed with patient? Yes  SHORT TERM GOALS: Target date: 11/21/22  Patient has reduction of tightness in the pelvic floor muscles so she is able to contract with a full circle.  Baseline: Goal status: INITIAL  2.  Patient is able  to engage the abdominals to increase pressure to have a bowel movement.  Baseline:  Goal status: INITIAL  3.  Patient is able to perform diaphragmatic breathing due to  improved lower rib cage movement.  Baseline:  Goal status: INITIAL    LONG TERM GOALS: Target date: 01/18/23  Patient is independent with advanced HEP for pelvic floor and core.  Baseline:  Goal status: INITIAL  2.  Patient is able to have a bowel movement without straining >/= 80% of the time.  Baseline:  Goal status: INITIAL  3.  Patient is able to fully empty her rectum so her stool leakage is reduced >/= 70%.  Baseline:  Goal status: INITIAL  4.  Patient is able to go out with minimal use of immodium to reduce the amount of stool leakage number of times to toilet. Baseline:  Goal status: INITIAL  5.  Patient reports she has 5 or less bowel movements per day due to improved emptying of the stool.  Baseline:  Goal status: INITIAL  6.  Patient is able to push the therapist finger out of the anal canal due to improved coordination of the pelvic floor muscles.  Baseline:  Goal status: INITIAL  PLAN:  PT FREQUENCY: 1x/week  PT DURATION: 12 weeks  PLANNED INTERVENTIONS: Therapeutic exercises, Therapeutic activity, Neuromuscular re-education, Patient/Family education, Joint mobilization, Dry Needling, Electrical stimulation, Taping, Biofeedback, and Manual therapy  PLAN FOR NEXT SESSION: manual work on the abdomen and diaphragm, diaphragmatic breathing, contraction of the abdomen, go over bowel diary   Eulis Foster, PT 12/12/22 1:24 PM

## 2022-12-12 NOTE — Patient Instructions (Addendum)
About Abdominal Massage  Abdominal massage, also called external colon massage, is a self-treatment circular massage technique that can reduce and eliminate gas and ease constipation. The colon naturally contracts in waves in a clockwise direction starting from inside the right hip, moving up toward the ribs, across the belly, and down inside the left hip.  When you perform circular abdominal massage, you help stimulate your colon's normal wave pattern of movement called peristalsis.  It is most beneficial when done after eating.  Positioning You can practice abdominal massage with oil while lying down, or in the shower with soap.  Some people find that it is just as effective to do the massage through clothing while sitting or standing.  How to Massage Start by placing your finger tips or knuckles on your right side, just inside your hip bone.  Make small circular movements while you move upward toward your rib cage.   Once you reach the bottom right side of your rib cage, take your circular movements across to the left side of the bottom of your rib cage.  Next, move downward until you reach the inside of your left hip bone.  This is the path your feces travel in your colon. Continue to perform your abdominal massage in this pattern for 10 minutes each day.     You can apply as much pressure as is comfortable in your massage.  Start gently and build pressure as you continue to practice.  Notice any areas of pain as you massage; areas of slight pain may be relieved as you massage, but if you have areas of significant or intense pain, consult with your healthcare provider.  Other Considerations General physical activity including bending and stretching can have a beneficial massage-like effect on the colon.  Deep breathing can also stimulate the colon because breathing deeply activates the same nervous system that supplies the colon.   Abdominal massage should always be used in combination with a  bowel-conscious diet that is high in the proper type of fiber for you, fluids (primarily water), and a regular exercise program.  [facility] [address], [address] [place], [place] [address] Phone # [phone] Fax [phone]  

## 2022-12-23 ENCOUNTER — Ambulatory Visit: Payer: 59 | Admitting: Physical Therapy

## 2022-12-23 ENCOUNTER — Encounter: Payer: Self-pay | Admitting: Physical Therapy

## 2022-12-23 DIAGNOSIS — R278 Other lack of coordination: Secondary | ICD-10-CM

## 2022-12-23 DIAGNOSIS — K5900 Constipation, unspecified: Secondary | ICD-10-CM

## 2022-12-23 DIAGNOSIS — M6281 Muscle weakness (generalized): Secondary | ICD-10-CM

## 2022-12-23 DIAGNOSIS — R252 Cramp and spasm: Secondary | ICD-10-CM

## 2022-12-23 NOTE — Patient Instructions (Addendum)
How To Poop Better:  What are Good Poops? There is no one exact normal, but they should be REGULAR.  This varies from person to person and ranges from up to 3x/day or as little as 3-4/week.  This should stay consistent for you.  They should be formed and ideally one solid mass that doesn't fall apart or dissolve in the water and is brown in color.  Lifestyle Tips:  Fiber: Eat 25-31 grams per day Do not hold it.  If you need to go, GO! Try to go every day around the same time Walk and move more Probiotics for more healthy gut bacteria Water and fluids: half of your healthy body weight in ounces  Toileting Tips:  Posture: knees above hips, back flat, look straight ahead, RELAX Relax all the muscles from your face down to your toes Breathe: slow deep breaths into your belly and pelvic floor is RELAXED Blow: Tighten belly and blow like blowing up a balloon, make "SH" sound, make a vowel sound with a deep voice Do NOT sit more than 10 minutes After you are finished, tighten the muscles to reset pelvic floor back to normal     [facility] [address], [address] [place], [place] [address] Phone # [phone] Fax [phone]   

## 2022-12-23 NOTE — Therapy (Signed)
OUTPATIENT PHYSICAL THERAPY FEMALE PELVIC TREATMENT   Patient Name: [name] [name] MRN: [id number] DOB:23-Jun-1956, 67 y.o., female Today's Date: 12/23/2022  END OF SESSION:  PT End of Session - 12/23/22 1107     Visit Number 3    Date for PT Re-Evaluation 01/18/23    Authorization Type [employer]    PT Start Time 1100    PT Stop Time 1140    PT Time Calculation (min) 40 min    Activity Tolerance Patient tolerated treatment well    Behavior During Therapy WFL for tasks assessed/performed               Past Medical History:  Diagnosis Date   Anemia    hx of, not at this time   ANXIETY DISORDER- mixed disorder w/ depressive symptoms 02/07/2014   Aortic regurgitation    Arthritis    "right hip; left hip; most of my joints" (05/27/2014)   Blood transfusion without reported diagnosis    Phreesia 09/04/2020   Cancer (HCC)    [employer]reesia 09/04/2020   Depression    Diverticulosis    Essential hypertension, benign    takes Lisinopril-HCTZ daily   Heart murmur    High cholesterol    takes Pravastatin daily   History of blood transfusion    no abnormal reaction noted 2015   History of colon polyps    benign   History of colonic polyps 05/21/2021   Hyperlipidemia    Phreesia 09/04/2020   Hypertension    [employer]reesia 09/04/2020   Insomnia    doesn't take any meds   Joint pain    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   Past Surgical History:  Procedure Laterality Date   ANAL RECTAL MANOMETRY N/A 07/17/2017   Procedure: ANO RECTAL MANOMETRY;  Surgeon: Napoleon Form, MD;  Location: [facility];  Service: Endoscopy;  Laterality: N/A;   COLECTOMY  Aug 2004   S/P Low Anterior Resxn (Sigmoid Colon)   COLON  SURGERY N/A    [employer]reesia 09/04/2020   COLONOSCOPY     DILATION AND CURETTAGE OF UTERUS  1991   JOINT REPLACEMENT N/A    [employer]reesia 09/04/2020   LUMBAR DISC SURGERY  Feb 2007   SPINE SURGERY N/A    [employer]reesia 09/04/2020   TOTAL HIP ARTHROPLASTY Right 05/27/2014   Procedure: RIGHT TOTAL HIP ARTHROPLASTY ANTERIOR APPROACH;  Surgeon: Velna Ochs, MD;  Location: [facility];  Service: Orthopedics;  Laterality: Right;   TOTAL HIP ARTHROPLASTY Left 08/25/2015   Procedure: TOTAL HIP ARTHROPLASTY ANTERIOR APPROACH;  Surgeon: Marcene Corning, MD;  Location: [facility];  Service: Orthopedics;  Laterality: Left;   Patient Active Problem List   Diagnosis Date Noted   Nocturia 10/25/2021   Neuropathy 10/25/2021   Insomnia 10/25/2021   History of blood transfusion 10/25/2021   Heart murmur 10/25/2021   Diverticulosis 10/25/2021   Depression 10/25/2021   Cancer (HCC) 10/25/2021   Arthritis 10/25/2021   Aortic regurgitation 10/25/2021   Anemia 10/25/2021   Monoallelic mutation of SDHA gene 06/25/2021   History of colonic polyps 05/21/2021   Atherosclerosis of aorta (HCC) 03/08/2021   Steatosis of liver 03/08/2021   Dyslipidemia 03/08/2021   Prediabetes 03/06/2020  Abnormal echocardiogram 05/08/2019   Enlarged thoracic aorta (HCC) 04/17/2019   Elevated cholesterol 02/14/2018   Status post bilateral total hip replacement 05/27/2014   ANXIETY DISORDER- mixed disorder w/ depressive symptoms 02/07/2014   PERIPHERAL NEUROPATHY 10/13/2007   Essential hypertension 10/13/2007   HERNIA, UMBILICAL 10/13/2007   COLONIC POLYPS, ADENOMATOUS 02/20/2003   DIVERTICULOSIS, COLON 02/20/2003   History of malignant neoplasm of large intestine 02/20/2003   Rectosigmoid cancer (HCC) 2004    PCP: Georgina Quint, MD  REFERRING PROVIDER: Napoleon Form, MD   REFERRING DIAG: [phone] (ICD-10-CM) - Constipation, outlet dysfunctionM62.89 (ICD-10-CM) - Pelvic floor dysfunctionM54.9,G89.29 (ICD-10-CM) - Other  chronic back painM25.551,M25.552 (ICD-10-CM) - Bilateral hip pain  THERAPY DIAG:  Muscle weakness (generalized)  Other lack of coordination  Cramp and spasm  Constipation, unspecified constipation type  Rationale for Evaluation and Treatment: Rehabilitation  ONSET DATE: 2015  SUBJECTIVE:                                                                                                                                                                                           SUBJECTIVE STATEMENT: I have been constipated for the past few days.   PAIN:  Are you having pain? No, not in pelvic floor   PRECAUTIONS: Other: colon cancer  WEIGHT BEARING RESTRICTIONS: No  FALLS:  Has patient fallen in last 6 months? No  LIVING ENVIRONMENT: Lives with: lives with their spouse   OCCUPATION: works from home  PLOF: Independent  PATIENT GOALS: normalize her stool to not affect her live   PERTINENT HISTORY:  Bil. Hip replacement; colon cancer; colon resection  BOWEL MOVEMENT: Pain with bowel movement: No Type of bowel movement:Type (Bristol Stool Scale) varies from Type 1-Type 7, Frequency 12 times the day she has a bowel movement, Strain Yes, and Splinting yes Fully empty rectum: No, will go several times with a small amount of stool Leakage: Yes: the diarrhea  and will make sure she is close to the bathroom Pads: No Fiber supplement: Yes: benefiber  URINATION: Pain with urination: No Fully empty bladder: No, after urination may feel like she has to urinate again and may take 3 times Stream:  average Urgency: Yes: sometimes Frequency: 5 during the day, 1 time at night Leakage: Coughing and Laughing Pads: no  INTERCOURSE:not active ; in past was painful  after menopause  PREGNANCY: Vaginal deliveries 1  Tearing No     OBJECTIVE:   DIAGNOSTIC FINDINGS:  none   COGNITION: Overall cognitive status: Within functional limits for tasks assessed      POSTURE:  rounded shoulders, forward head, and increased thoracic kyphosis  PELVIC  ALIGNMENT:  LUMBARAROM/PROM:  A/PROM A/PROM  eval  Flexion full  Extension Decreased by 50%  Right lateral flexion Decreased by 25%  Left lateral flexion Decreased by 25%  Right rotation Decreased by 25%  Left rotation Decreased by 25%   (Blank rows = not tested)  LOWER EXTREMITY ROM:  Passive ROM Right eval Left eval  Hip external rotation 45 50   (Blank rows = not tested)    PALPATION:   General  rib angle greater than 90 degrees; Difficulty with bringing the lower rib cage together on exhalation; not able to contract the lower abdomen instead will pelvic tilt. Tenderness located in the right lower quadrant. Restrictions around the lower abdominal scars.                 External Perineal Exam stool was on the outside of the rectum and inside the rectum                             Internal Pelvic Floor tightness in the anterior rectum, along the right iliococcygeus and obturator internist, tightness at the anococcygeal ligament, difficulty with contracting anal sphincter on the right  Patient confirms identification and approves PT to assess internal pelvic floor and treatment Yes  PELVIC MMT:   MMT eval 12/23/22  Internal Anal Sphincter 2/5 4/5  External Anal Sphincter 2/5 4/5  Puborectalis 2/5 4/5  (Blank rows = not tested)        TONE: Increased on the right  TODAY'S TREATMENT:     12/23/22 Manual: Soft tissue mobilization: Circular massage to the abdomen to promote peristalic motion Manual work along the suprapubic area to reduce the thickness of the tissue Scar tissue mobilization: Scar mobilization to the suprapubic area to release the fascial restrictions Myofascial release: Tried using the suction cup on the lower abdomen but too painful Release of the lower abdomen and right lateral abdomen Internal pelvic floor techniques: No emotional/communication barriers or cognitive  limitation. Patient is motivated to learn. Patient understands and agrees with treatment goals and plan. PT explains patient will be examined in standing, sitting, and lying down to see how their muscles and joints work. When they are ready, they will be asked to remove their underwear so PT can examine their perineum. The patient is also given the option of providing their own chaperone as one is not provided in our facility. The patient also has the right and is explained the right to defer or refuse any part of the evaluation or treatment including the internal exam. With the patient's consent, PT will use one gloved finger to gently assess the muscles of the pelvic floor, seeing how well it contracts and relaxes and if there is muscle symmetry. After, the patient will get dressed and PT and patient will discuss exam findings and plan of care. PT and patient discuss plan of care, schedule, attendance policy and HEP activities.  Manual work going through the anus working on the anococcygeal ligament, along the puborectalis and iliococcygeal  Stool was felt in the rectum Neuromuscular re-education: Down training: Therapist finger in the rectum working on diaphragmatic breathing to feel the rectum open up. Then breath in and purse her lips to push the therapist finger out.  Therapeutic activities: Functional strengthening activities: Educated patient on correct pooping to breath correctly, knees above the hips  12/12/22 Manual: Soft tissue mobilization: Abdominal massage to promote peristalic motion of the intestines Educated patient on  abdominal massage to do at home.  Scar tissue mobilization: Along the midline of the lower abdomen to release the restrictions Myofascial release: Fascial release of the mesenteric root, along the ileocecal valve, along the suprapubic area, release of the sac of douglas, and release of the right lateral abdominal area Exercises: Stretches/mobility: Educated patient  on filling out the bowel diary to see if certain foods affect her stool  PATIENT EDUCATION: Education details: abdominal massage and how to fill out the bowel diary Person educated: Patient Education method: Chief Technology Officer Education comprehension: verbalized understanding   HOME EXERCISE PROGRAM: See above.  ASSESSMENT:  CLINICAL IMPRESSION: Patient is a 67 y.o. female who was seen today for physical therapy  treatment for constipation, pelvic floor dysfunction. Pelvic floor strength increased to 4/5. She was able to open the rectum with diaphragmatic breathing. She was able to push the therapist finger out with the correct breathing to facilitate a bowel movement. The lower abdominal scar continues to have increased restrictions.  Patient will benefit from skilled therapy to improve pelvic floor coordination.   OBJECTIVE IMPAIRMENTS: decreased coordination, decreased endurance, decreased strength, increased fascial restrictions, and increased muscle spasms.   ACTIVITY LIMITATIONS: continence, toileting, and locomotion level  PARTICIPATION LIMITATIONS: interpersonal relationship and community activity  PERSONAL FACTORS: Age, Fitness, Time since onset of injury/illness/exacerbation, and 3+ comorbidities: Bil. Hip replacement; colon cancer; colon resection  are also affecting patient's functional outcome.   REHAB POTENTIAL: Good  CLINICAL DECISION MAKING: Evolving/moderate complexity  EVALUATION COMPLEXITY: Moderate   GOALS: Goals reviewed with patient? Yes  SHORT TERM GOALS: Target date: 11/21/22  Patient has reduction of tightness in the pelvic floor muscles so she is able to contract with a full circle.  Baseline: Goal status: 12/23/22  2.  Patient is able to engage the abdominals to increase pressure to have a bowel movement.  Baseline:  Goal status: INITIAL  3.  Patient is able to perform diaphragmatic breathing due to improved lower rib cage movement.   Baseline:  Goal status: Met 12/23/22    LONG TERM GOALS: Target date: 01/18/23  Patient is independent with advanced HEP for pelvic floor and core.  Baseline:  Goal status: INITIAL  2.  Patient is able to have a bowel movement without straining >/= 80% of the time.  Baseline:  Goal status: INITIAL  3.  Patient is able to fully empty her rectum so her stool leakage is reduced >/= 70%.  Baseline:  Goal status: INITIAL  4.  Patient is able to go out with minimal use of immodium to reduce the amount of stool leakage number of times to toilet. Baseline:  Goal status: INITIAL  5.  Patient reports she has 5 or less bowel movements per day due to improved emptying of the stool.  Baseline:  Goal status: INITIAL  6.  Patient is able to push the therapist finger out of the anal canal due to improved coordination of the pelvic floor muscles.  Baseline:  Goal status: INITIAL  PLAN:  PT FREQUENCY: 1x/week  PT DURATION: 12 weeks  PLANNED INTERVENTIONS: Therapeutic exercises, Therapeutic activity, Neuromuscular re-education, Patient/Family education, Joint mobilization, Dry Needling, Electrical stimulation, Taping, Biofeedback, and Manual therapy  PLAN FOR NEXT SESSION: manual work on the abdomen and diaphragm,  contraction of the abdomen, go over bowel diary, work on lower abdominal scar, work on back and pull abdomen forward   Eulis Foster, PT 12/23/22 11:51 AM

## 2022-12-28 ENCOUNTER — Ambulatory Visit: Payer: 59 | Admitting: Physical Therapy

## 2022-12-28 ENCOUNTER — Encounter: Payer: Self-pay | Admitting: Physical Therapy

## 2022-12-28 DIAGNOSIS — M6281 Muscle weakness (generalized): Secondary | ICD-10-CM

## 2022-12-28 DIAGNOSIS — K5900 Constipation, unspecified: Secondary | ICD-10-CM

## 2022-12-28 DIAGNOSIS — R252 Cramp and spasm: Secondary | ICD-10-CM

## 2022-12-28 DIAGNOSIS — R278 Other lack of coordination: Secondary | ICD-10-CM

## 2022-12-28 NOTE — Therapy (Signed)
OUTPATIENT PHYSICAL THERAPY FEMALE PELVIC TREATMENT   Patient Name: [name] [name] MRN: [id number] DOB:01/28/1956, 67 y.o., female Today's Date: 12/28/2022  END OF SESSION:  PT End of Session - 12/28/22 1230     Visit Number 4    Date for PT Re-Evaluation 01/18/23    Authorization Type [employer]    PT Start Time 1230    PT Stop Time 1255   had to leave early to go to work   PT Time Calculation (min) 25 min    Activity Tolerance Patient tolerated treatment well    Behavior During Therapy WFL for tasks assessed/performed               Past Medical History:  Diagnosis Date   Anemia    hx of, not at this time   ANXIETY DISORDER- mixed disorder w/ depressive symptoms 02/07/2014   Aortic regurgitation    Arthritis    "right hip; left hip; most of my joints" (05/27/2014)   Blood transfusion without reported diagnosis    [employer]reesia 09/04/2020   Cancer (HCC)    [employer] 09/04/2020   Depression    Diverticulosis    Essential hypertension, benign    takes Lisinopril-HCTZ daily   Heart murmur    High cholesterol    takes Pravastatin daily   History of blood transfusion    no abnormal reaction noted 2015   History of colon polyps    benign   History of colonic polyps 05/21/2021   Hyperlipidemia    [employer]ia 09/04/2020   Hypertension    [employer] 09/04/2020   Insomnia    doesn't take any meds   Joint pain    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   Past Surgical History:  Procedure Laterality Date   ANAL RECTAL MANOMETRY N/A 07/17/2017   Procedure: ANO RECTAL MANOMETRY;  Surgeon: Napoleon Form, MD;  Location: [facility];  Service: Endoscopy;  Laterality: N/A;   COLECTOMY  Aug 2004   S/P Low  Anterior Resxn (Sigmoid Colon)   COLON SURGERY N/A    [employer] 09/04/2020   COLONOSCOPY     DILATION AND CURETTAGE OF UTERUS  1991   JOINT REPLACEMENT N/A    [employer]ia 09/04/2020   LUMBAR DISC SURGERY  Feb 2007   SPINE SURGERY N/A    [employer] 09/04/2020   TOTAL HIP ARTHROPLASTY Right 05/27/2014   Procedure: RIGHT TOTAL HIP ARTHROPLASTY ANTERIOR APPROACH;  Surgeon: Velna Ochs, MD;  Location: [facility];  Service: Orthopedics;  Laterality: Right;   TOTAL HIP ARTHROPLASTY Left 08/25/2015   Procedure: TOTAL HIP ARTHROPLASTY ANTERIOR APPROACH;  Surgeon: Marcene Corning, MD;  Location: [facility];  Service: Orthopedics;  Laterality: Left;   Patient Active Problem List   Diagnosis Date Noted   Nocturia 10/25/2021   Neuropathy 10/25/2021   Insomnia 10/25/2021   History of blood transfusion 10/25/2021   Heart murmur 10/25/2021   Diverticulosis 10/25/2021   Depression 10/25/2021   Cancer (HCC) 10/25/2021   Arthritis 10/25/2021   Aortic regurgitation 10/25/2021   Anemia 10/25/2021   Monoallelic mutation of SDHA gene 06/25/2021   History of colonic polyps 05/21/2021   Atherosclerosis of aorta (HCC) 03/08/2021   Steatosis of liver  03/08/2021   Dyslipidemia 03/08/2021   Prediabetes 03/06/2020   Abnormal echocardiogram 05/08/2019   Enlarged thoracic aorta (HCC) 04/17/2019   Elevated cholesterol 02/14/2018   Status post bilateral total hip replacement 05/27/2014   ANXIETY DISORDER- mixed disorder w/ depressive symptoms 02/07/2014   PERIPHERAL NEUROPATHY 10/13/2007   Essential hypertension 10/13/2007   HERNIA, UMBILICAL 10/13/2007   COLONIC POLYPS, ADENOMATOUS 02/20/2003   DIVERTICULOSIS, COLON 02/20/2003   History of malignant neoplasm of large intestine 02/20/2003   Rectosigmoid cancer (HCC) 2004    PCP: Georgina Quint, MD  REFERRING PROVIDER: Napoleon Form, MD   REFERRING DIAG: [phone] (ICD-10-CM) - Constipation, outlet dysfunctionM62.89 (ICD-10-CM) - Pelvic floor  dysfunctionM54.9,G89.29 (ICD-10-CM) - Other chronic back painM25.551,M25.552 (ICD-10-CM) - Bilateral hip pain  THERAPY DIAG:  Muscle weakness (generalized)  Other lack of coordination  Cramp and spasm  Constipation, unspecified constipation type  Rationale for Evaluation and Treatment: Rehabilitation  ONSET DATE: 2015  SUBJECTIVE:                                                                                                                                                                                           SUBJECTIVE STATEMENT: I feel okay. I have been going to the bathroom daily but I do not empty fully. The next day after treatment I felt some better. Constipation affects my back.     PAIN:  Are you having pain? No, not in pelvic floor   PRECAUTIONS: Other: colon cancer  WEIGHT BEARING RESTRICTIONS: No  FALLS:  Has patient fallen in last 6 months? No  LIVING ENVIRONMENT: Lives with: lives with their spouse   OCCUPATION: works from home  PLOF: Independent  PATIENT GOALS: normalize her stool to not affect her live   PERTINENT HISTORY:  Bil. Hip replacement; colon cancer; colon resection  BOWEL MOVEMENT: Pain with bowel movement: No Type of bowel movement:Type (Bristol Stool Scale) varies from Type 1-Type 7, Frequency 12 times the day she has a bowel movement, Strain Yes, and Splinting yes Fully empty rectum: No, will go several times with a small amount of stool Leakage: Yes: the diarrhea  and will make sure she is close to the bathroom Pads: No Fiber supplement: Yes: benefiber  URINATION: Pain with urination: No Fully empty bladder: No, after urination may feel like she has to urinate again and may take 3 times Stream:  average Urgency: Yes: sometimes Frequency: 5 during the day, 1 time at night Leakage: Coughing and Laughing Pads: no  INTERCOURSE:not active ; in past was painful  after menopause  PREGNANCY: Vaginal deliveries 1  Tearing  No     OBJECTIVE:  DIAGNOSTIC FINDINGS:  none   COGNITION: Overall cognitive status: Within functional limits for tasks assessed      POSTURE: rounded shoulders, forward head, and increased thoracic kyphosis  PELVIC ALIGNMENT:  LUMBARAROM/PROM:  A/PROM A/PROM  eval  Flexion full  Extension Decreased by 50%  Right lateral flexion Decreased by 25%  Left lateral flexion Decreased by 25%  Right rotation Decreased by 25%  Left rotation Decreased by 25%   (Blank rows = not tested)  LOWER EXTREMITY ROM:  Passive ROM Right eval Left eval  Hip external rotation 45 50   (Blank rows = not tested)    PALPATION:   General  rib angle greater than 90 degrees; Difficulty with bringing the lower rib cage together on exhalation; not able to contract the lower abdomen instead will pelvic tilt. Tenderness located in the right lower quadrant. Restrictions around the lower abdominal scars.                 External Perineal Exam stool was on the outside of the rectum and inside the rectum                             Internal Pelvic Floor tightness in the anterior rectum, along the right iliococcygeus and obturator internist, tightness at the anococcygeal ligament, difficulty with contracting anal sphincter on the right  Patient confirms identification and approves PT to assess internal pelvic floor and treatment Yes  PELVIC MMT:   MMT eval 12/23/22  Internal Anal Sphincter 2/5 4/5  External Anal Sphincter 2/5 4/5  Puborectalis 2/5 4/5  (Blank rows = not tested)        TONE: Increased on the right  TODAY'S TREATMENT:     12/28/22 Manual: Soft tissue mobilization: Manual work to the lumbar paraspinals, quadratus, and right gluteus medius Myofascial release: Patient in quadruped with therapist pulling the fascia forward to the abdomen Therapist gently releasing the abdominal tissue pulling it forward and have the patient move forward and back and then diagonals in  quadruped Therapist releasing the lower abdominal area in quadruped with on hand anterior and other on the sacrum  12/23/22 Manual: Soft tissue mobilization: Circular massage to the abdomen to promote peristalic motion Manual work along the suprapubic area to reduce the thickness of the tissue Scar tissue mobilization: Scar mobilization to the suprapubic area to release the fascial restrictions Myofascial release: Tried using the suction cup on the lower abdomen but too painful Release of the lower abdomen and right lateral abdomen Internal pelvic floor techniques: No emotional/communication barriers or cognitive limitation. Patient is motivated to learn. Patient understands and agrees with treatment goals and plan. PT explains patient will be examined in standing, sitting, and lying down to see how their muscles and joints work. When they are ready, they will be asked to remove their underwear so PT can examine their perineum. The patient is also given the option of providing their own chaperone as one is not provided in our facility. The patient also has the right and is explained the right to defer or refuse any part of the evaluation or treatment including the internal exam. With the patient's consent, PT will use one gloved finger to gently assess the muscles of the pelvic floor, seeing how well it contracts and relaxes and if there is muscle symmetry. After, the patient will get dressed and PT and patient will discuss exam findings and plan of care. PT and patient  discuss plan of care, schedule, attendance policy and HEP activities.  Manual work going through the anus working on the anococcygeal ligament, along the puborectalis and iliococcygeal  Stool was felt in the rectum Neuromuscular re-education: Down training: Therapist finger in the rectum working on diaphragmatic breathing to feel the rectum open up. Then breath in and purse her lips to push the therapist finger out.  Therapeutic  activities: Functional strengthening activities: Educated patient on correct pooping to breath correctly, knees above the hips  12/12/22 Manual: Soft tissue mobilization: Abdominal massage to promote peristalic motion of the intestines Educated patient on abdominal massage to do at home.  Scar tissue mobilization: Along the midline of the lower abdomen to release the restrictions Myofascial release: Fascial release of the mesenteric root, along the ileocecal valve, along the suprapubic area, release of the sac of douglas, and release of the right lateral abdominal area Exercises: Stretches/mobility: Educated patient on filling out the bowel diary to see if certain foods affect her stool  PATIENT EDUCATION: Education details: abdominal massage and how to fill out the bowel diary Person educated: Patient Education method: Chief Technology Officer Education comprehension: verbalized understanding   HOME EXERCISE PROGRAM: See above.  ASSESSMENT:  CLINICAL IMPRESSION: Patient is a 67 y.o. female who was seen today for physical therapy  treatment for constipation, pelvic floor dysfunction. Patient had no back pain after the manual work. Therapist was trying to manipulate the intestines to improve peristalic motion in quadruped to assist with bowel movements. Patient is not fully emptying her rectum due to feeling full and bloated. The urinary leakage is some better.  Patient will benefit from skilled therapy to improve pelvic floor coordination.   OBJECTIVE IMPAIRMENTS: decreased coordination, decreased endurance, decreased strength, increased fascial restrictions, and increased muscle spasms.   ACTIVITY LIMITATIONS: continence, toileting, and locomotion level  PARTICIPATION LIMITATIONS: interpersonal relationship and community activity  PERSONAL FACTORS: Age, Fitness, Time since onset of injury/illness/exacerbation, and 3+ comorbidities: Bil. Hip replacement; colon cancer; colon  resection  are also affecting patient's functional outcome.   REHAB POTENTIAL: Good  CLINICAL DECISION MAKING: Evolving/moderate complexity  EVALUATION COMPLEXITY: Moderate   GOALS: Goals reviewed with patient? Yes  SHORT TERM GOALS: Target date: 11/21/22  Patient has reduction of tightness in the pelvic floor muscles so she is able to contract with a full circle.  Baseline: Goal status: 12/23/22  2.  Patient is able to engage the abdominals to increase pressure to have a bowel movement.  Baseline:  Goal status: INITIAL  3.  Patient is able to perform diaphragmatic breathing due to improved lower rib cage movement.  Baseline:  Goal status: Met 12/23/22    LONG TERM GOALS: Target date: 01/18/23  Patient is independent with advanced HEP for pelvic floor and core.  Baseline:  Goal status: INITIAL  2.  Patient is able to have a bowel movement without straining >/= 80% of the time.  Baseline:  Goal status: INITIAL  3.  Patient is able to fully empty her rectum so her stool leakage is reduced >/= 70%.  Baseline:  Goal status: INITIAL  4.  Patient is able to go out with minimal use of immodium to reduce the amount of stool leakage number of times to toilet. Baseline:  Goal status: INITIAL  5.  Patient reports she has 5 or less bowel movements per day due to improved emptying of the stool.  Baseline:  Goal status: INITIAL  6.  Patient is able to push the therapist finger  out of the anal canal due to improved coordination of the pelvic floor muscles.  Baseline:  Goal status: INITIAL  PLAN:  PT FREQUENCY: 1x/week  PT DURATION: 12 weeks  PLANNED INTERVENTIONS: Therapeutic exercises, Therapeutic activity, Neuromuscular re-education, Patient/Family education, Joint mobilization, Dry Needling, Electrical stimulation, Taping, Biofeedback, and Manual therapy  PLAN FOR NEXT SESSION: manual work on the abdomen and diaphragm,  contraction of the abdomen for HEP, work on lower  abdominal scar  Eulis Foster, PT 12/28/22 1:08 PM

## 2022-12-30 ENCOUNTER — Other Ambulatory Visit: Payer: Self-pay | Admitting: Gastroenterology

## 2023-01-04 ENCOUNTER — Ambulatory Visit: Payer: 59 | Admitting: Internal Medicine

## 2023-01-04 ENCOUNTER — Other Ambulatory Visit: Payer: Self-pay | Admitting: Emergency Medicine

## 2023-01-04 DIAGNOSIS — M4722 Other spondylosis with radiculopathy, cervical region: Secondary | ICD-10-CM

## 2023-01-06 ENCOUNTER — Ambulatory Visit: Payer: 59 | Admitting: Physical Therapy

## 2023-01-11 ENCOUNTER — Encounter: Payer: Self-pay | Admitting: Physical Therapy

## 2023-01-11 ENCOUNTER — Ambulatory Visit: Payer: 59 | Attending: Gastroenterology | Admitting: Physical Therapy

## 2023-01-11 DIAGNOSIS — K5900 Constipation, unspecified: Secondary | ICD-10-CM | POA: Insufficient documentation

## 2023-01-11 DIAGNOSIS — M6281 Muscle weakness (generalized): Secondary | ICD-10-CM | POA: Insufficient documentation

## 2023-01-11 DIAGNOSIS — R278 Other lack of coordination: Secondary | ICD-10-CM | POA: Diagnosis present

## 2023-01-11 DIAGNOSIS — R252 Cramp and spasm: Secondary | ICD-10-CM | POA: Insufficient documentation

## 2023-01-11 NOTE — Therapy (Signed)
OUTPATIENT PHYSICAL THERAPY FEMALE PELVIC TREATMENT   Patient Name: [name] [name] MRN: [id number] DOB:[birth year], 67 y.o., female Today's Date: 01/11/2023  END OF SESSION:  PT End of Session - 01/11/23 1107     Visit Number 5    Date for PT Re-Evaluation 01/18/23    Authorization Type [employer]    PT Start Time 1100    PT Stop Time 1140    PT Time Calculation (min) 40 min    Activity Tolerance Patient tolerated treatment well    Behavior During Therapy WFL for tasks assessed/performed               Past Medical History:  Diagnosis Date   Anemia    hx of, not at this time   ANXIETY DISORDER- mixed disorder w/ depressive symptoms 02/07/2014   Aortic regurgitation    Arthritis    "right hip; left hip; most of my joints" (05/27/2014)   Blood transfusion without reported diagnosis    [employer]reesia 09/04/2020   Cancer (HCC)    [employer] 09/04/2020   Depression    Diverticulosis    Essential hypertension, benign    takes Lisinopril-HCTZ daily   Heart murmur    High cholesterol    takes Pravastatin daily   History of blood transfusion    no abnormal reaction noted 2015   History of colon polyps    benign   History of colonic polyps 05/21/2021   Hyperlipidemia    [employer]ia 09/04/2020   Hypertension    [employer] 09/04/2020   Insomnia    doesn't take any meds   Joint pain    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   Past Surgical History:  Procedure Laterality Date   ANAL RECTAL MANOMETRY N/A 07/17/2017   Procedure: ANO RECTAL MANOMETRY;  Surgeon: Napoleon Form, MD;  Location: [facility];  Service: Endoscopy;  Laterality: N/A;   COLECTOMY  Aug 2004   S/P Low Anterior Resxn (Sigmoid Colon)   COLON  SURGERY N/A    [employer] 09/04/2020   COLONOSCOPY     DILATION AND CURETTAGE OF UTERUS  1991   JOINT REPLACEMENT N/A    [employer] 09/04/2020   LUMBAR DISC SURGERY  Feb 2007   SPINE SURGERY N/A    [employer] 09/04/2020   TOTAL HIP ARTHROPLASTY Right 05/27/2014   Procedure: RIGHT TOTAL HIP ARTHROPLASTY ANTERIOR APPROACH;  Surgeon: Velna Ochs, MD;  Location: [facility];  Service: Orthopedics;  Laterality: Right;   TOTAL HIP ARTHROPLASTY Left 08/25/2015   Procedure: TOTAL HIP ARTHROPLASTY ANTERIOR APPROACH;  Surgeon: Marcene Corning, MD;  Location: [facility];  Service: Orthopedics;  Laterality: Left;   Patient Active Problem List   Diagnosis Date Noted   Nocturia 10/25/2021   Neuropathy 10/25/2021   Insomnia 10/25/2021   History of blood transfusion 10/25/2021   Heart murmur 10/25/2021   Diverticulosis 10/25/2021   Depression 10/25/2021   Cancer (HCC) 10/25/2021   Arthritis 10/25/2021   Aortic regurgitation 10/25/2021   Anemia 10/25/2021   Monoallelic mutation of SDHA gene 06/25/2021   History of colonic polyps 05/21/2021   Atherosclerosis of aorta (HCC) 03/08/2021   Steatosis of liver 03/08/2021   Dyslipidemia 03/08/2021   Prediabetes 03/06/2020  Abnormal echocardiogram 05/08/2019   Enlarged thoracic aorta (HCC) 04/17/2019   Elevated cholesterol 02/14/2018   Status post bilateral total hip replacement 05/27/2014   ANXIETY DISORDER- mixed disorder w/ depressive symptoms 02/07/2014   PERIPHERAL NEUROPATHY 10/13/2007   Essential hypertension 10/13/2007   HERNIA, UMBILICAL 10/13/2007   COLONIC POLYPS, ADENOMATOUS 02/20/2003   DIVERTICULOSIS, COLON 02/20/2003   History of malignant neoplasm of large intestine 02/20/2003   Rectosigmoid cancer (HCC) 2004    PCP: Georgina Quint, MD  REFERRING PROVIDER: Napoleon Form, MD   REFERRING DIAG: [phone] (ICD-10-CM) - Constipation, outlet dysfunctionM62.89 (ICD-10-CM) - Pelvic floor dysfunctionM54.9,G89.29 (ICD-10-CM) - Other  chronic back painM25.551,M25.552 (ICD-10-CM) - Bilateral hip pain  THERAPY DIAG:  Muscle weakness (generalized)  Other lack of coordination  Cramp and spasm  Constipation, unspecified constipation type  Rationale for Evaluation and Treatment: Rehabilitation  ONSET DATE: 2015  SUBJECTIVE:                                                                                                                                                                                           SUBJECTIVE STATEMENT: I feel okay. I have been going to the bathroom daily but I do not empty fully. The next day after treatment I felt some better. Constipation affects my back.     PAIN:  Are you having pain? No, not in pelvic floor   PRECAUTIONS: Other: colon cancer  WEIGHT BEARING RESTRICTIONS: No  FALLS:  Has patient fallen in last 6 months? No  LIVING ENVIRONMENT: Lives with: lives with their spouse   OCCUPATION: works from home  PLOF: Independent  PATIENT GOALS: normalize her stool to not affect her live   PERTINENT HISTORY:  Bil. Hip replacement; colon cancer; colon resection  BOWEL MOVEMENT: Pain with bowel movement: No Type of bowel movement:Type (Bristol Stool Scale) varies from Type 1-Type 7, Frequency 12 times the day she has a bowel movement, Strain Yes, and Splinting yes Fully empty rectum: No, will go several times with a small amount of stool Leakage: Yes: the diarrhea  and will make sure she is close to the bathroom Pads: No Fiber supplement: Yes: benefiber  URINATION: Pain with urination: No Fully empty bladder: No, after urination may feel like she has to urinate again and may take 3 times Stream:  average Urgency: Yes: sometimes Frequency: 5 during the day, 1 time at night Leakage: Coughing and Laughing Pads: no  INTERCOURSE:not active ; in past was painful  after menopause  PREGNANCY: Vaginal deliveries 1  Tearing No     OBJECTIVE:   DIAGNOSTIC FINDINGS:   none   COGNITION: Overall cognitive  status: Within functional limits for tasks assessed      POSTURE: rounded shoulders, forward head, and increased thoracic kyphosis  PELVIC ALIGNMENT: correct alignment  LUMBARAROM/PROM:  A/PROM A/PROM  eval  Flexion full  Extension Decreased by 50%  Right lateral flexion Decreased by 25%  Left lateral flexion Decreased by 25%  Right rotation Decreased by 25%  Left rotation Decreased by 25%   (Blank rows = not tested)  LOWER EXTREMITY ROM:  Passive ROM Right eval Left eval  Hip external rotation 45 50   (Blank rows = not tested)    PALPATION:   General  rib angle greater than 90 degrees; Difficulty with bringing the lower rib cage together on exhalation; not able to contract the lower abdomen instead will pelvic tilt. Tenderness located in the right lower quadrant. Restrictions around the lower abdominal scars.                 External Perineal Exam stool was on the outside of the rectum and inside the rectum                             Internal Pelvic Floor tightness in the anterior rectum, along the right iliococcygeus and obturator internist, tightness at the anococcygeal ligament, difficulty with contracting anal sphincter on the right  Patient confirms identification and approves PT to assess internal pelvic floor and treatment Yes  PELVIC MMT:   MMT eval 12/23/22  Internal Anal Sphincter 2/5 4/5  External Anal Sphincter 2/5 4/5  Puborectalis 2/5 4/5  (Blank rows = not tested)        TONE: Increased on the right  TODAY'S TREATMENT:     01/11/23 Exercises: Strengthening: Transverse abdominus contraction with breathing in and out to contract the upper and lower abdomin together 10 x  Supine knee fall out with transverse abdominus contraction 10 x on each side Supine abdominal contraction with alternating shoulder flexion Supine bilateral shoulder extension with red band with abdominal contraction Sitting moving ball in  diagonals with abdominal contraction      12/28/22 Manual: Soft tissue mobilization: Manual work to the lumbar paraspinals, quadratus, and right gluteus medius Myofascial release: Patient in quadruped with therapist pulling the fascia forward to the abdomen Therapist gently releasing the abdominal tissue pulling it forward and have the patient move forward and back and then diagonals in quadruped Therapist releasing the lower abdominal area in quadruped with on hand anterior and other on the sacrum  12/23/22 Manual: Soft tissue mobilization: Circular massage to the abdomen to promote peristalic motion Manual work along the suprapubic area to reduce the thickness of the tissue Scar tissue mobilization: Scar mobilization to the suprapubic area to release the fascial restrictions Myofascial release: Tried using the suction cup on the lower abdomen but too painful Release of the lower abdomen and right lateral abdomen Internal pelvic floor techniques: No emotional/communication barriers or cognitive limitation. Patient is motivated to learn. Patient understands and agrees with treatment goals and plan. PT explains patient will be examined in standing, sitting, and lying down to see how their muscles and joints work. When they are ready, they will be asked to remove their underwear so PT can examine their perineum. The patient is also given the option of providing their own chaperone as one is not provided in our facility. The patient also has the right and is explained the right to defer or refuse any part of the evaluation or  treatment including the internal exam. With the patient's consent, PT will use one gloved finger to gently assess the muscles of the pelvic floor, seeing how well it contracts and relaxes and if there is muscle symmetry. After, the patient will get dressed and PT and patient will discuss exam findings and plan of care. PT and patient discuss plan of care, schedule, attendance  policy and HEP activities.  Manual work going through the anus working on the anococcygeal ligament, along the puborectalis and iliococcygeal  Stool was felt in the rectum Neuromuscular re-education: Down training: Therapist finger in the rectum working on diaphragmatic breathing to feel the rectum open up. Then breath in and purse her lips to push the therapist finger out.  Therapeutic activities: Functional strengthening activities: Educated patient on correct pooping to breath correctly, knees above the hips  PATIENT EDUCATION: 01/11/23 Education details: Access Code: [email or web address] Person educated: Patient Education method: Explanation, Demonstration, Tactile cues, Verbal cues, and Handouts Education comprehension: verbalized understanding, returned demonstration, verbal cues required, tactile cues required, and needs further education  HOME EXERCISE PROGRAM: 01/11/23 Access Code: [email or web address] URL: [email or web address] Date: 01/11/2023 Prepared by: Eulis Foster  Program Notes sit in chair move ball in and out  10 then side to side  Exercises - Hooklying Transversus Abdominis Palpation  - 1 x daily - 7 x weekly - 1 sets - 10 reps - Bent Knee Fallouts  - 1 x daily - 7 x weekly - 1 sets - 10 reps - Supine Alternating Shoulder Flexion  - 1 x daily - 7 x weekly - 1 sets - 10 reps  ASSESSMENT:  CLINICAL IMPRESSION: Patient is a 67 y.o. female who was seen today for physical therapy  treatment for constipation, pelvic floor dysfunction. Stool leakage is 50% better.  She is trying to work between diarrhea and constipation. Patient able to contract the upper and lower abdominals together correctly. Patient will benefit from skilled therapy to improve pelvic floor coordination.   OBJECTIVE IMPAIRMENTS: decreased coordination, decreased endurance, decreased strength, increased fascial restrictions, and increased muscle spasms.   ACTIVITY LIMITATIONS: continence, toileting, and  locomotion level  PARTICIPATION LIMITATIONS: interpersonal relationship and community activity  PERSONAL FACTORS: Age, Fitness, Time since onset of injury/illness/exacerbation, and 3+ comorbidities: Bil. Hip replacement; colon cancer; colon resection  are also affecting patient's functional outcome.   REHAB POTENTIAL: Good  CLINICAL DECISION MAKING: Evolving/moderate complexity  EVALUATION COMPLEXITY: Moderate   GOALS: Goals reviewed with patient? Yes  SHORT TERM GOALS: Target date: 11/21/22  Patient has reduction of tightness in the pelvic floor muscles so she is able to contract with a full circle.  Baseline: Goal status: 12/23/22  2.  Patient is able to engage the abdominals to increase pressure to have a bowel movement.  Baseline:  Goal status: Met 01/11/23  3.  Patient is able to perform diaphragmatic breathing due to improved lower rib cage movement.  Baseline:  Goal status: Met 12/23/22    LONG TERM GOALS: Target date: 01/18/23  Patient is independent with advanced HEP for pelvic floor and core.  Baseline:  Goal status: INITIAL  2.  Patient is able to have a bowel movement without straining >/= 80% of the time.  Baseline:  Goal status: INITIAL  3.  Patient is able to fully empty her rectum so her stool leakage is reduced >/= 70%.  Baseline:  Goal status: INITIAL  4.  Patient is able to go out with minimal use of immodium to reduce the amount of stool  leakage number of times to toilet. Baseline:  Goal status: INITIAL  5.  Patient reports she has 5 or less bowel movements per day due to improved emptying of the stool.  Baseline:  Goal status: INITIAL  6.  Patient is able to push the therapist finger out of the anal canal due to improved coordination of the pelvic floor muscles.  Baseline:  Goal status: INITIAL  PLAN:  PT FREQUENCY: 1x/week  PT DURATION: 12 weeks  PLANNED INTERVENTIONS: Therapeutic exercises, Therapeutic activity, Neuromuscular  re-education, Patient/Family education, Joint mobilization, Dry Needling, Electrical stimulation, Taping, Biofeedback, and Manual therapy  PLAN FOR NEXT SESSION: manual work on the abdomen and diaphragm,  contraction of the abdomen for HEP while sitting on ball, work on lower abdominal scar; write renewal  Eulis Foster, PT 01/11/23 11:07 AM

## 2023-01-18 ENCOUNTER — Encounter: Payer: Self-pay | Admitting: Physical Therapy

## 2023-01-18 ENCOUNTER — Ambulatory Visit: Payer: 59 | Admitting: Physical Therapy

## 2023-01-18 DIAGNOSIS — M6281 Muscle weakness (generalized): Secondary | ICD-10-CM

## 2023-01-18 DIAGNOSIS — R252 Cramp and spasm: Secondary | ICD-10-CM

## 2023-01-18 DIAGNOSIS — K5900 Constipation, unspecified: Secondary | ICD-10-CM

## 2023-01-18 DIAGNOSIS — R278 Other lack of coordination: Secondary | ICD-10-CM

## 2023-01-18 NOTE — Therapy (Signed)
OUTPATIENT PHYSICAL THERAPY FEMALE PELVIC TREATMENT   Patient Name: [name] [name] MRN: [id number] DOB:1955-11-15, 67 y.o., female Today's Date: 01/18/2023  END OF SESSION:  PT End of Session - 01/18/23 1228     Visit Number 6    Date for PT Re-Evaluation 01/18/23    Authorization Type [employer]    PT Start Time 1230    PT Stop Time 1310    PT Time Calculation (min) 40 min    Activity Tolerance Patient tolerated treatment well    Behavior During Therapy WFL for tasks assessed/performed               Past Medical History:  Diagnosis Date   Anemia    hx of, not at this time   ANXIETY DISORDER- mixed disorder w/ depressive symptoms 02/07/2014   Aortic regurgitation    Arthritis    "right hip; left hip; most of my joints" (05/27/2014)   Blood transfusion without reported diagnosis    [employer]reesia 09/04/2020   Cancer (HCC)    [employer] 09/04/2020   Depression    Diverticulosis    Essential hypertension, benign    takes Lisinopril-HCTZ daily   Heart murmur    High cholesterol    takes Pravastatin daily   History of blood transfusion    no abnormal reaction noted 2015   History of colon polyps    benign   History of colonic polyps 05/21/2021   Hyperlipidemia    [employer]ia 09/04/2020   Hypertension    [employer] 09/04/2020   Insomnia    doesn't take any meds   Joint pain    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   Past Surgical History:  Procedure Laterality Date   ANAL RECTAL MANOMETRY N/A 07/17/2017   Procedure: ANO RECTAL MANOMETRY;  Surgeon: Napoleon Form, MD;  Location: [facility];  Service: Endoscopy;  Laterality: N/A;   COLECTOMY  Aug 2004   S/P Low Anterior Resxn (Sigmoid Colon)   COLON  SURGERY N/A    [employer]ia 09/04/2020   COLONOSCOPY     DILATION AND CURETTAGE OF UTERUS  1991   JOINT REPLACEMENT N/A    [employer]ia 09/04/2020   LUMBAR DISC SURGERY  Feb 2007   SPINE SURGERY N/A    [employer] 09/04/2020   TOTAL HIP ARTHROPLASTY Right 05/27/2014   Procedure: RIGHT TOTAL HIP ARTHROPLASTY ANTERIOR APPROACH;  Surgeon: Velna Ochs, MD;  Location: [facility];  Service: Orthopedics;  Laterality: Right;   TOTAL HIP ARTHROPLASTY Left 08/25/2015   Procedure: TOTAL HIP ARTHROPLASTY ANTERIOR APPROACH;  Surgeon: Marcene Corning, MD;  Location: [facility];  Service: Orthopedics;  Laterality: Left;   Patient Active Problem List   Diagnosis Date Noted   Nocturia 10/25/2021   Neuropathy 10/25/2021   Insomnia 10/25/2021   History of blood transfusion 10/25/2021   Heart murmur 10/25/2021   Diverticulosis 10/25/2021   Depression 10/25/2021   Cancer (HCC) 10/25/2021   Arthritis 10/25/2021   Aortic regurgitation 10/25/2021   Anemia 10/25/2021   Monoallelic mutation of SDHA gene 06/25/2021   History of colonic polyps 05/21/2021   Atherosclerosis of aorta (HCC) 03/08/2021   Steatosis of liver 03/08/2021   Dyslipidemia 03/08/2021   Prediabetes 03/06/2020  Abnormal echocardiogram 05/08/2019   Enlarged thoracic aorta (HCC) 04/17/2019   Elevated cholesterol 02/14/2018   Status post bilateral total hip replacement 05/27/2014   ANXIETY DISORDER- mixed disorder w/ depressive symptoms 02/07/2014   PERIPHERAL NEUROPATHY 10/13/2007   Essential hypertension 10/13/2007   HERNIA, UMBILICAL 10/13/2007   COLONIC POLYPS, ADENOMATOUS 02/20/2003   DIVERTICULOSIS, COLON 02/20/2003   History of malignant neoplasm of large intestine 02/20/2003   Rectosigmoid cancer (HCC) 2004    PCP: Georgina Quint, MD  REFERRING PROVIDER: Napoleon Form, MD   REFERRING DIAG: [phone] (ICD-10-CM) - Constipation, outlet dysfunctionM62.89 (ICD-10-CM) - Pelvic floor dysfunctionM54.9,G89.29 (ICD-10-CM) - Other  chronic back painM25.551,M25.552 (ICD-10-CM) - Bilateral hip pain  THERAPY DIAG:  Muscle weakness (generalized)  Other lack of coordination  Cramp and spasm  Constipation, unspecified constipation type  Rationale for Evaluation and Treatment: Rehabilitation  ONSET DATE: 2015  SUBJECTIVE:                                                                                                                                                                                           SUBJECTIVE STATEMENT: I have diarrhea this morning. I have been letting the stool run its course. I feel it could be the linzess. I am emptying my rectum better.  I feel okay. I have been going to the bathroom daily but I do not empty fully. The next day after treatment I felt some better. Constipation affects my back.     PAIN:  Are you having pain? No, not in pelvic floor   PRECAUTIONS: Other: colon cancer  WEIGHT BEARING RESTRICTIONS: No  FALLS:  Has patient fallen in last 6 months? No  LIVING ENVIRONMENT: Lives with: lives with their spouse   OCCUPATION: works from home  PLOF: Independent  PATIENT GOALS: normalize her stool to not affect her live   PERTINENT HISTORY:  Bil. Hip replacement; colon cancer; colon resection  BOWEL MOVEMENT: Pain with bowel movement: No Type of bowel movement:Type (Bristol Stool Scale) varies from Type 1-Type 7, Frequency 12 times the day she has a bowel movement, Strain Yes, and Splinting yes Fully empty rectum: No, will go several times with a small amount of stool Leakage: Yes: the diarrhea  and will make sure she is close to the bathroom Pads: No Fiber supplement: Yes: benefiber  URINATION: Pain with urination: No Fully empty bladder: No, after urination may feel like she has to urinate again and may take 3 times Stream:  average Urgency: Yes: sometimes Frequency: 5 during the day, 1 time at night Leakage: Coughing and Laughing Pads: no  INTERCOURSE:not  active ; in past was  painful  after menopause  PREGNANCY: Vaginal deliveries 1  Tearing No     OBJECTIVE:   DIAGNOSTIC FINDINGS:  none   COGNITION: Overall cognitive status: Within functional limits for tasks assessed      POSTURE: rounded shoulders, forward head, and increased thoracic kyphosis  PELVIC ALIGNMENT: correct alignment  LUMBARAROM/PROM:  A/PROM A/PROM  eval  Flexion full  Extension Decreased by 50%  Right lateral flexion Decreased by 25%  Left lateral flexion Decreased by 25%  Right rotation Decreased by 25%  Left rotation Decreased by 25%   (Blank rows = not tested)  LOWER EXTREMITY ROM:  Passive ROM Right eval Left eval  Hip external rotation 45 50   (Blank rows = not tested)    PALPATION:   General  rib angle greater than 90 degrees; Difficulty with bringing the lower rib cage together on exhalation; not able to contract the lower abdomen instead will pelvic tilt. Tenderness located in the right lower quadrant. Restrictions around the lower abdominal scars.                 External Perineal Exam stool was on the outside of the rectum and inside the rectum                             Internal Pelvic Floor tightness in the anterior rectum, along the right iliococcygeus and obturator internist, tightness at the anococcygeal ligament, difficulty with contracting anal sphincter on the right  Patient confirms identification and approves PT to assess internal pelvic floor and treatment Yes  PELVIC MMT:   MMT eval 12/23/22  Internal Anal Sphincter 2/5 4/5  External Anal Sphincter 2/5 4/5  Puborectalis 2/5 4/5  (Blank rows = not tested)        TONE: Increased on the right  TODAY'S TREATMENT:     01/18/23 Manual: Soft tissue mobilization: Supine moving from the pubic bone upward 10 x to lift the tissue off the bladder and elongate the colon then educated patient on how to perform at home.    Exercises: Stretches/mobility: Strengthening: Sitting shoulder external rotation with yellow band 20 x  Sitting shoulder horizontal abduction 10 x Sitting shoulder diagonals 10 x each way Supine hip abduction with red band 10 x each side Supine marching with red band around the knees 10 x each side Therapeutic activities: Functional strengthening activities: Educated patient on her posture to lengthen the space between her rib cage and pubic bone decreasing the space for the internal organs including the intestines 01/11/23 Exercises: Strengthening: Transverse abdominus contraction with breathing in and out to contract the upper and lower abdomin together 10 x  Supine knee fall out with transverse abdominus contraction 10 x on each side Supine abdominal contraction with alternating shoulder flexion Supine bilateral shoulder extension with red band with abdominal contraction Sitting moving ball in diagonals with abdominal contraction      12/28/22 Manual: Soft tissue mobilization: Manual work to the lumbar paraspinals, quadratus, and right gluteus medius Myofascial release: Patient in quadruped with therapist pulling the fascia forward to the abdomen Therapist gently releasing the abdominal tissue pulling it forward and have the patient move forward and back and then diagonals in quadruped Therapist releasing the lower abdominal area in quadruped with on hand anterior and other on the sacrum   PATIENT EDUCATION: 01/18/23 Education details: Access Code: [employer] Person educated: Patient Education method: Explanation, Demonstration, Actor cues, Verbal cues, and Handouts Education comprehension:  verbalized understanding, returned demonstration, verbal cues required, tactile cues required, and needs further education  HOME EXERCISE PROGRAM: 01/18/23 Access Code: [email or web address] URL: [email or web address] Date: 01/18/2023 Prepared by: Eulis Foster  Program Notes sit in  chair move ball in and out  10 then side to sidelay on back and use hands to pull up the lower abdomen for 10 x   Exercises - Hooklying Transversus Abdominis Palpation  - 1 x daily - 7 x weekly - 1 sets - 10 reps - Bent Knee Fallouts  - 1 x daily - 7 x weekly - 1 sets - 10 reps - Seated Shoulder External Rotation  - 1 x daily - 2 x weekly - 1 sets - 10 reps - 1 sec hold - Seated Shoulder Horizontal Abduction with Resistance  - 1 x daily - 2 x weekly - 1 sets - 10 reps - 1 sec hold - Seated Shoulder Diagonal Pulls with Resistance  - 1 x daily - 2 x weekly - 3 sets - 10 reps - Supine Abdominal Wall Massage  - 1 x daily - 7 x weekly - 3 sets - 10 reps - Supine March with Resistance Band  - 1 x daily - 2 x weekly - 2 sets - 10 reps  ASSESSMENT:  CLINICAL IMPRESSION: Patient is a 67 y.o. female who was seen today for physical therapy  treatment for constipation, pelvic floor dysfunction. Stool leakage is 50% better.  She is trying to work between diarrhea and constipation. Patient is independent with her current HEP. Patient able to contract the upper and lower abdominals together correctly. Patient empties her rectum better when she has loose stools.  OBJECTIVE IMPAIRMENTS: decreased coordination, decreased endurance, decreased strength, increased fascial restrictions, and increased muscle spasms.   ACTIVITY LIMITATIONS: continence, toileting, and locomotion level  PARTICIPATION LIMITATIONS: interpersonal relationship and community activity  PERSONAL FACTORS: Age, Fitness, Time since onset of injury/illness/exacerbation, and 3+ comorbidities: Bil. Hip replacement; colon cancer; colon resection  are also affecting patient's functional outcome.   REHAB POTENTIAL: Good  CLINICAL DECISION MAKING: Evolving/moderate complexity  EVALUATION COMPLEXITY: Moderate   GOALS: Goals reviewed with patient? Yes  SHORT TERM GOALS: Target date: 11/21/22  Patient has reduction of tightness in the pelvic  floor muscles so she is able to contract with a full circle.  Baseline: Goal status: Met 12/23/22  2.  Patient is able to engage the abdominals to increase pressure to have a bowel movement.  Baseline:  Goal status: Met 01/11/23  3.  Patient is able to perform diaphragmatic breathing due to improved lower rib cage movement.  Baseline:  Goal status: Met 12/23/22    LONG TERM GOALS: Target date: 01/18/23  Patient is independent with advanced HEP for pelvic floor and core.  Baseline:  Goal status: Met 01/18/23  2.  Patient is able to have a bowel movement without straining >/= 80% of the time.  Baseline:  Goal status: Partially met 01/18/23  3.  Patient is able to fully empty her rectum so her stool leakage is reduced >/= 70%.  Baseline:  Goal status: Partially met 01/18/23  4.  Patient is able to go out with minimal use of immodium to reduce the amount of stool leakage number of times to toilet. Baseline:  Goal status: Not met 01/18/23  5.  Patient reports she has 5 or less bowel movements per day due to improved emptying of the stool.  Baseline:  Goal status: Not met 01/18/23  6.  Patient is  able to push the therapist finger out of the anal canal due to improved coordination of the pelvic floor muscles.  Baseline:  Goal status: Met 01/18/23  PLAN:Discharge to HEP today    Eulis Foster, PT 01/18/23 12:29 PM   PHYSICAL THERAPY DISCHARGE SUMMARY  Visits from Start of Care: 6  Current functional level related to goals / functional outcomes: See above.    Remaining deficits: See above. Patient is happy with her current level. She wants to work on her exercises at home and talk to doctor about adjusting her Linzess to reduce her diarrhea.    Education / Equipment: HEP   Patient agrees to discharge. Patient goals were partially met. Patient is being discharged due to being pleased with the current functional level. Thank you for the referral. Eulis Foster, PT 01/18/23 1:07  PM

## 2023-01-25 ENCOUNTER — Encounter: Payer: 59 | Admitting: Physical Therapy

## 2023-01-29 ENCOUNTER — Other Ambulatory Visit: Payer: Self-pay | Admitting: Cardiology

## 2023-02-24 ENCOUNTER — Other Ambulatory Visit: Payer: Self-pay | Admitting: Cardiology

## 2023-02-28 ENCOUNTER — Other Ambulatory Visit: Payer: Self-pay | Admitting: Cardiology

## 2023-04-03 ENCOUNTER — Ambulatory Visit: Payer: 59 | Admitting: Emergency Medicine

## 2023-04-03 ENCOUNTER — Encounter: Payer: Self-pay | Admitting: Emergency Medicine

## 2023-04-03 VITALS — BP 136/74 | HR 77 | Temp 97.8°F | Ht 63.0 in | Wt 149.5 lb

## 2023-04-03 DIAGNOSIS — R202 Paresthesia of skin: Secondary | ICD-10-CM

## 2023-04-03 DIAGNOSIS — C19 Malignant neoplasm of rectosigmoid junction: Secondary | ICD-10-CM | POA: Diagnosis not present

## 2023-04-03 DIAGNOSIS — R7303 Prediabetes: Secondary | ICD-10-CM

## 2023-04-03 DIAGNOSIS — I10 Essential (primary) hypertension: Secondary | ICD-10-CM | POA: Diagnosis not present

## 2023-04-03 DIAGNOSIS — K760 Fatty (change of) liver, not elsewhere classified: Secondary | ICD-10-CM

## 2023-04-03 DIAGNOSIS — E785 Hyperlipidemia, unspecified: Secondary | ICD-10-CM

## 2023-04-03 DIAGNOSIS — G609 Hereditary and idiopathic neuropathy, unspecified: Secondary | ICD-10-CM

## 2023-04-03 DIAGNOSIS — I700 Atherosclerosis of aorta: Secondary | ICD-10-CM

## 2023-04-03 HISTORY — DX: Paresthesia of skin: R20.2

## 2023-04-03 LAB — COMPREHENSIVE METABOLIC PANEL
ALT: 18 U/L (ref 0–35)
AST: 15 U/L (ref 0–37)
Albumin: 4.5 g/dL (ref 3.5–5.2)
Alkaline Phosphatase: 40 U/L (ref 39–117)
BUN: 35 mg/dL — ABNORMAL HIGH (ref 6–23)
CO2: 22 mEq/L (ref 19–32)
Calcium: 9.5 mg/dL (ref 8.4–10.5)
Chloride: 100 mEq/L (ref 96–112)
Creatinine, Ser: 1.12 mg/dL (ref 0.40–1.20)
GFR: 51.2 mL/min — ABNORMAL LOW (ref >60.00)
Glucose, Bld: 132 mg/dL — ABNORMAL HIGH (ref 70–99)
Potassium: 5.5 mEq/L — ABNORMAL HIGH (ref 3.5–5.1)
Sodium: 130 mEq/L — ABNORMAL LOW (ref 135–145)
Total Bilirubin: 1 mg/dL (ref 0.2–1.2)
Total Protein: 7.2 g/dL (ref 6.0–8.3)

## 2023-04-03 LAB — LIPID PANEL
Cholesterol: 164 mg/dL (ref 0–200)
HDL: 83.2 mg/dL (ref >39.00)
LDL Cholesterol: 60 mg/dL (ref 0–99)
NonHDL: 81.07
Total CHOL/HDL Ratio: 2
Triglycerides: 105 mg/dL (ref 0.0–149.0)
VLDL: 21 mg/dL (ref 0.0–40.0)

## 2023-04-03 LAB — TSH: TSH: 1.85 u[IU]/mL (ref 0.35–5.50)

## 2023-04-03 LAB — CBC WITH DIFFERENTIAL/PLATELET
Basophils Absolute: 0 10*3/uL (ref 0.0–0.1)
Basophils Relative: 0.3 % (ref 0.0–3.0)
Eosinophils Absolute: 0.1 10*3/uL (ref 0.0–0.7)
Eosinophils Relative: 0.9 % (ref 0.0–5.0)
HCT: 35.5 % — ABNORMAL LOW (ref 36.0–46.0)
Hemoglobin: 11.8 g/dL — ABNORMAL LOW (ref 12.0–15.0)
Lymphocytes Relative: 24.9 % (ref 12.0–46.0)
Lymphs Abs: 1.7 10*3/uL (ref 0.7–4.0)
MCHC: 33.2 g/dL (ref 30.0–36.0)
MCV: 93.5 fl (ref 78.0–100.0)
Monocytes Absolute: 0.8 10*3/uL (ref 0.1–1.0)
Monocytes Relative: 10.8 % (ref 3.0–12.0)
Neutro Abs: 4.4 10*3/uL (ref 1.4–7.7)
Neutrophils Relative %: 63.1 % (ref 43.0–77.0)
Platelets: 201 10*3/uL (ref 150.0–400.0)
RBC: 3.8 Mil/uL — ABNORMAL LOW (ref 3.87–5.11)
RDW: 12.4 % (ref 11.5–15.5)
WBC: 7 10*3/uL (ref 4.0–10.5)

## 2023-04-03 LAB — HEMOGLOBIN A1C: Hgb A1c MFr Bld: 6.4 % (ref 4.6–6.5)

## 2023-04-03 LAB — VITAMIN B12: Vitamin B-12: 160 pg/mL — ABNORMAL LOW (ref 211–911)

## 2023-04-03 LAB — VITAMIN D 25 HYDROXY (VIT D DEFICIENCY, FRACTURES): VITD: 44.77 ng/mL (ref 30.00–100.00)

## 2023-04-03 NOTE — Assessment & Plan Note (Signed)
Diet and nutrition discussed Cardiovascular risk associated with diabetes discussed Hemoglobin A1c done today

## 2023-04-03 NOTE — Assessment & Plan Note (Signed)
Presently on gabapentin 600 mg Dose was recently increased May be contributing to paresthesia Advised to decrease dose and monitor symptoms

## 2023-04-03 NOTE — Assessment & Plan Note (Signed)
Diet and nutrition discussed Lipid profile done today Continue simvastatin 40 mg daily

## 2023-04-03 NOTE — Assessment & Plan Note (Signed)
Clinically stable.  No concerns.

## 2023-04-03 NOTE — Patient Instructions (Signed)
Health Maintenance After Age 65 After age 65, you are at a higher risk for certain long-term diseases and infections as well as injuries from falls. Falls are a major cause of broken bones and head injuries in people who are older than age 65. Getting regular preventive care can help to keep you healthy and well. Preventive care includes getting regular testing and making lifestyle changes as recommended by your health care provider. Talk with your health care provider about: Which screenings and tests you should have. A screening is a test that checks for a disease when you have no symptoms. A diet and exercise plan that is right for you. What should I know about screenings and tests to prevent falls? Screening and testing are the best ways to find a health problem early. Early diagnosis and treatment give you the best chance of managing medical conditions that are common after age 65. Certain conditions and lifestyle choices may make you more likely to have a fall. Your health care provider may recommend: Regular vision checks. Poor vision and conditions such as cataracts can make you more likely to have a fall. If you wear glasses, make sure to get your prescription updated if your vision changes. Medicine review. Work with your health care provider to regularly review all of the medicines you are taking, including over-the-counter medicines. Ask your health care provider about any side effects that may make you more likely to have a fall. Tell your health care provider if any medicines that you take make you feel dizzy or sleepy. Strength and balance checks. Your health care provider may recommend certain tests to check your strength and balance while standing, walking, or changing positions. Foot health exam. Foot pain and numbness, as well as not wearing proper footwear, can make you more likely to have a fall. Screenings, including: Osteoporosis screening. Osteoporosis is a condition that causes  the bones to get weaker and break more easily. Blood pressure screening. Blood pressure changes and medicines to control blood pressure can make you feel dizzy. Depression screening. You may be more likely to have a fall if you have a fear of falling, feel depressed, or feel unable to do activities that you used to do. Alcohol use screening. Using too much alcohol can affect your balance and may make you more likely to have a fall. Follow these instructions at home: Lifestyle Do not drink alcohol if: Your health care provider tells you not to drink. If you drink alcohol: Limit how much you have to: 0-1 drink a day for women. 0-2 drinks a day for men. Know how much alcohol is in your drink. In the [place] one drink equals one 12 oz bottle of beer (355 mL), one 5 oz glass of wine (148 mL), or one 1 oz glass of hard liquor (44 mL). Do not use any products that contain nicotine or tobacco. These products include cigarettes, chewing tobacco, and vaping devices, such as e-cigarettes. If you need help quitting, ask your health care provider. Activity  Follow a regular exercise program to stay fit. This will help you maintain your balance. Ask your health care provider what types of exercise are appropriate for you. If you need a cane or walker, use it as recommended by your health care provider. Wear supportive shoes that have nonskid soles. Safety  Remove any tripping hazards, such as rugs, cords, and clutter. Install safety equipment such as grab bars in bathrooms and safety rails on stairs. Keep rooms and walkways   well-lit. General instructions Talk with your health care provider about your risks for falling. Tell your health care provider if: You fall. Be sure to tell your health care provider about all falls, even ones that seem minor. You feel dizzy, tiredness (fatigue), or off-balance. Take over-the-counter and prescription medicines only as told by your health care provider. These include  supplements. Eat a healthy diet and maintain a healthy weight. A healthy diet includes low-fat dairy products, low-fat (lean) meats, and fiber from whole grains, beans, and lots of fruits and vegetables. Stay current with your vaccines. Schedule regular health, dental, and eye exams. Summary Having a healthy lifestyle and getting preventive care can help to protect your health and wellness after age 65. Screening and testing are the best way to find a health problem early and help you avoid having a fall. Early diagnosis and treatment give you the best chance for managing medical conditions that are more common for people who are older than age 65. Falls are a major cause of broken bones and head injuries in people who are older than age 65. Take precautions to prevent a fall at home. Work with your health care provider to learn what changes you can make to improve your health and wellness and to prevent falls. This information is not intended to replace advice given to you by your health care provider. Make sure you discuss any questions you have with your health care provider. Document Revised: 11/16/2020 Document Reviewed: 11/16/2020 Elsevier Patient Education  2024 [employer]  

## 2023-04-03 NOTE — Assessment & Plan Note (Signed)
Clinically stable.  No red flag signs or symptoms. Unremarkable physical examination.  No abnormal findings. Differential diagnosis discussed Recommend blood work today. Has been on simvastatin for many years.  Doubt it is related to chronic statin use. Recently had gabapentin dose increased.  May be related.

## 2023-04-03 NOTE — Progress Notes (Signed)
[name] [name] 67 y.o.   Chief Complaint  Patient presents with   Medical Management of Chronic Issues    f/u appt, patient states she is having some weakness in her arms, numbness and tingling     HISTORY OF PRESENT ILLNESS: This is a 67 y.o. female here for 70-month follow-up of chronic medical conditions Complaining of intermittent weakness to both arms along with tingling and numbness.  Short-lived episodes mostly happening at nighttime. No other associated symptoms. Recently had gabapentin dose increased. No other complaints or medical concerns today.  HPI   Prior to Admission medications   Medication Sig Start Date End Date Taking? Authorizing Provider  Artificial Tear Ointment (DRY EYES OP) Apply to eye as needed.   Yes [provider]  colesevelam (WELCHOL) 625 MG tablet TAKE 1 TABLET (625 MG TOTAL) BY MOUTH 3 (THREE) TIMES DAILY AS NEEDED. 03/04/22  Yes Nandigam, Eleonore Chiquito, MD  finasteride (PROPECIA) 1 MG tablet Take 1 mg by mouth daily. 10/29/21  Yes [provider]  gabapentin (NEURONTIN) 400 MG capsule Take 400 mg by mouth daily. 08/28/20  Yes [provider]  linaclotide (LINZESS) 290 MCG CAPS capsule Take 1 capsule (290 mcg total) by mouth daily before breakfast. 09/07/22  Yes Nandigam, Kavitha V, MD  lisinopril (ZESTRIL) 40 MG tablet TAKE 1 TABLET BY MOUTH EVERY DAY 10/30/22  Yes Zaylan Kissoon, Eilleen Kempf, MD  meloxicam (MOBIC) 15 MG tablet TAKE 1 TABLET (15 MG TOTAL) BY MOUTH DAILY. 01/04/23  Yes Landers Prajapati, Eilleen Kempf, MD  Minoxidil (ROGAINE MENS EXTRA STRENGTH) 5 % FOAM    Yes [provider]  Multiple Vitamins-Minerals (MULTIVITAMIN ADULT PO) Take by mouth daily.   Yes [provider]  simvastatin (ZOCOR) 40 MG tablet TAKE 1 TABLET BY MOUTH DAILY. NEEDS APPOINTMENT FOR FUTURE REFILLS - 1ST ATTEMPT 01/30/23  Yes Baldo Daub, MD  dicyclomine (BENTYL) 10 MG capsule TAKE 1 CAPSULE (10 MG TOTAL) BY MOUTH 3 (THREE) TIMES DAILY AS  NEEDED FOR SPASMS. Patient not taking: Reported on 04/03/2023 03/04/22   Napoleon Form, MD  LINZESS 145 MCG CAPS capsule TAKE 1 CAPSULE BY MOUTH DAILY BEFORE BREAKFAST. Patient not taking: Reported on 04/03/2023 12/30/22   Napoleon Form, MD    No Known Allergies  Patient Active Problem List   Diagnosis Date Noted   Nocturia 10/25/2021   Neuropathy 10/25/2021   Insomnia 10/25/2021   History of blood transfusion 10/25/2021   Heart murmur 10/25/2021   Diverticulosis 10/25/2021   Depression 10/25/2021   Cancer (HCC) 10/25/2021   Arthritis 10/25/2021   Aortic regurgitation 10/25/2021   Anemia 10/25/2021   Monoallelic mutation of SDHA gene 06/25/2021   History of colonic polyps 05/21/2021   Atherosclerosis of aorta (HCC) 03/08/2021   Steatosis of liver 03/08/2021   Dyslipidemia 03/08/2021   Prediabetes 03/06/2020   Abnormal echocardiogram 05/08/2019   Enlarged thoracic aorta (HCC) 04/17/2019   Elevated cholesterol 02/14/2018   Status post bilateral total hip replacement 05/27/2014   ANXIETY DISORDER- mixed disorder w/ depressive symptoms 02/07/2014   PERIPHERAL NEUROPATHY 10/13/2007   Essential hypertension 10/13/2007   HERNIA, UMBILICAL 10/13/2007   COLONIC POLYPS, ADENOMATOUS 02/20/2003   DIVERTICULOSIS, COLON 02/20/2003   History of malignant neoplasm of large intestine 02/20/2003   Rectosigmoid cancer (HCC) 2004    Past Medical History:  Diagnosis Date   Anemia    hx of, not at this time   ANXIETY DISORDER- mixed disorder w/ depressive symptoms 02/07/2014   Aortic regurgitation  Arthritis    "right hip; left hip; most of my joints" (05/27/2014)   Blood transfusion without reported diagnosis    [facility]reesia 09/04/2020   Cancer (HCC)    [facility] 09/04/2020   Depression    Diverticulosis    Essential hypertension, benign    takes Lisinopril-HCTZ daily   Heart murmur    High cholesterol    takes Pravastatin daily   History of blood transfusion    no  abnormal reaction noted 2015   History of colon polyps    benign   History of colonic polyps 05/21/2021   Hyperlipidemia    [facility] 09/04/2020   Hypertension    [facility] 09/04/2020   Insomnia    doesn't take any meds   Joint pain    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    Past Surgical History:  Procedure Laterality Date   ANAL RECTAL MANOMETRY N/A 07/17/2017   Procedure: ANO RECTAL MANOMETRY;  Surgeon: Napoleon Form, MD;  Location: [facility];  Service: Endoscopy;  Laterality: N/A;   COLECTOMY  Aug 2004   S/P Low Anterior Resxn (Sigmoid Colon)   COLON SURGERY N/A    [facility] 09/04/2020   COLONOSCOPY     DILATION AND CURETTAGE OF UTERUS  1991   JOINT REPLACEMENT N/A    [facility] 09/04/2020   LUMBAR DISC SURGERY  Feb 2007   SPINE SURGERY N/A    [facility] 09/04/2020   TOTAL HIP ARTHROPLASTY Right 05/27/2014   Procedure: RIGHT TOTAL HIP ARTHROPLASTY ANTERIOR APPROACH;  Surgeon: Velna Ochs, MD;  Location: [facility] OR;  Service: Orthopedics;  Laterality: Right;   TOTAL HIP ARTHROPLASTY Left 08/25/2015   Procedure: TOTAL HIP ARTHROPLASTY ANTERIOR APPROACH;  Surgeon: Marcene Corning, MD;  Location: [facility] OR;  Service: Orthopedics;  Laterality: Left;    Social History   Socioeconomic History   Marital status: Married    Spouse name: Alinda Money   Number of children: 1   Years of education: Not on file   Highest education level: Master's degree (e.g., MA, MS, MEng, MEd, MSW, MBA)  Occupational History   Occupation: office work  Tobacco Use   Smoking status: Former    Types: Cigarettes    Passive exposure: Past   Smokeless tobacco: Never   Tobacco comments:    "smoked some socially in my college days"   Vaping Use   Vaping status: Never Used  Substance and Sexual Activity   Alcohol use: Yes    Comment: occasionally beer/wine   Drug use: No   Sexual activity: Yes    Partners: Male    Birth control/protection: None  Other Topics Concern   Not on file  Social History Narrative   Lives with husband   Exercise walking 2 x weekly   Patient's family history -  UNKNOWN ADOPTED   Social Determinants of Health   Financial Resource Strain: Low Risk  (03/31/2023)   Overall Financial Resource Strain (CARDIA)    Difficulty of Paying Living Expenses: Not hard at all  Food Insecurity: No Food Insecurity (03/31/2023)   Hunger Vital Sign    Worried About Running Out of Food in the Last Year: Never true    Ran Out of  Food in the Last Year: Never true  Transportation Needs: No Transportation Needs (03/31/2023)   PRAPARE - Administrator, Civil Service (Medical): No    Lack of Transportation (Non-Medical): No  Physical Activity: Insufficiently Active (03/31/2023)   Exercise Vital Sign    Days of Exercise per Week: 3 days    Minutes of Exercise per Session: 30 min  Stress: No Stress Concern Present (03/31/2023)   [employer] of Occupational Health - Occupational Stress Questionnaire    Feeling of Stress : Only a little  Social Connections: Moderately Integrated (03/31/2023)   Social Connection and Isolation Panel [NHANES]    Frequency of Communication with Friends and Family: More than three times a week    Frequency of Social Gatherings with Friends and Family: Once a week    Attends Religious Services: Never    Database administrator or Organizations: Yes    Attends Engineer, structural: More than 4 times per year    Marital Status: Married  Catering manager Violence: Not on file    Family History  Adopted: Yes  Problem Relation Age of Onset   Colitis Neg Hx    Colon cancer Neg Hx    Colon polyps Neg Hx    Esophageal cancer Neg Hx    Rectal cancer Neg Hx     Stomach cancer Neg Hx      Review of Systems  Constitutional: Negative.  Negative for chills and fever.  HENT: Negative.  Negative for congestion and sore throat.   Respiratory: Negative.  Negative for cough and shortness of breath.   Cardiovascular: Negative.  Negative for chest pain and palpitations.  Gastrointestinal:  Negative for abdominal pain, diarrhea, nausea and vomiting.  Genitourinary: Negative.  Negative for dysuria and hematuria.  Musculoskeletal:  Positive for back pain.  Skin: Negative.  Negative for rash.  Neurological: Negative.  Negative for dizziness and headaches.  All other systems reviewed and are negative.   Vitals:   04/03/23 0859  BP: 136/74  Pulse: 77  Temp: 97.8 F (36.6 C)  SpO2: 93%    Physical Exam Vitals reviewed.  Constitutional:      Appearance: Normal appearance.  HENT:     Head: Normocephalic.     Mouth/Throat:     Mouth: Mucous membranes are moist.     Pharynx: Oropharynx is clear.  Eyes:     Extraocular Movements: Extraocular movements intact.     Conjunctiva/sclera: Conjunctivae normal.     Pupils: Pupils are equal, round, and reactive to light.  Cardiovascular:     Rate and Rhythm: Normal rate and regular rhythm.     Pulses: Normal pulses.     Heart sounds: Normal heart sounds.  Pulmonary:     Effort: Pulmonary effort is normal.     Breath sounds: Normal breath sounds.  Abdominal:     Palpations: Abdomen is soft.     Tenderness: There is no abdominal tenderness.  Musculoskeletal:     Cervical back: No tenderness.  Lymphadenopathy:     Cervical: No cervical adenopathy.  Skin:    General: Skin is warm and dry.     Capillary Refill: Capillary refill takes less than 2 seconds.  Neurological:     General: No focal deficit present.     Mental Status: She is alert and oriented to person, place, and time.     Sensory: No sensory deficit.     Motor: No weakness.     Coordination: Coordination  normal.     Gait: Gait normal.      Deep Tendon Reflexes: Reflexes normal.  Psychiatric:        Mood and Affect: Mood normal.        Behavior: Behavior normal.      ASSESSMENT & PLAN: A total of 43 minutes was spent with the patient and counseling/coordination of care regarding preparing for this visit, review of most recent office visit notes, review of multiple chronic medical conditions under management, review of most recent blood work results, review of all medications, review of health maintenance items, cardiovascular risks associated with hypertension and dyslipidemia, education on nutrition, differential diagnosis of arm paresthesias and need for workup, prognosis, documentation, and need for follow-up.  Problem List Items Addressed This Visit       Cardiovascular and Mediastinum   Essential hypertension - Primary    BP Readings from Last 3 Encounters:  04/03/23 136/74  09/13/22 118/68  09/07/22 120/60  Well-controlled hypertension Continue lisinopril 40 mg daily Cardiovascular risks associated with hypertension discussed Diet and nutrition discussed       Relevant Orders   CBC with Differential/Platelet   Comprehensive metabolic panel   Atherosclerosis of aorta (HCC)    Diet and nutrition discussed Lipid profile done today Continue simvastatin 40 mg daily        Digestive   Steatosis of liver    Diet and nutrition discussed Lipid profile done today Continue simvastatin 40 mg daily      Relevant Orders   Lipid panel   CBC with Differential/Platelet   Comprehensive metabolic panel   Rectosigmoid cancer (HCC)    Clinically stable.  No concerns.      Relevant Orders   CBC with Differential/Platelet     Nervous and Auditory   PERIPHERAL NEUROPATHY    Presently on gabapentin 600 mg Dose was recently increased May be contributing to paresthesia Advised to decrease dose and monitor symptoms        Other   Prediabetes    Diet and nutrition discussed Cardiovascular risk associated  with diabetes discussed Hemoglobin A1c done today      Relevant Orders   Hemoglobin A1c   Dyslipidemia    Diet and nutrition discussed Lipid profile done today Continue simvastatin 40 mg daily      Relevant Orders   Lipid panel   Paresthesia of upper extremity    Clinically stable.  No red flag signs or symptoms. Unremarkable physical examination.  No abnormal findings. Differential diagnosis discussed Recommend blood work today. Has been on simvastatin for many years.  Doubt it is related to chronic statin use. Recently had gabapentin dose increased.  May be related.      Relevant Orders   Vitamin B12   VITAMIN D 25 Hydroxy (Vit-D Deficiency, Fractures)   TSH   Hemoglobin A1c   CBC with Differential/Platelet   CK   Patient Instructions  Health Maintenance After Age 14 After age 65, you are at a higher risk for certain long-term diseases and infections as well as injuries from falls. Falls are a major cause of broken bones and head injuries in people who are older than age 106. Getting regular preventive care can help to keep you healthy and well. Preventive care includes getting regular testing and making lifestyle changes as recommended by your health care provider. Talk with your health care provider about: Which screenings and tests you should have. A screening is a test that checks for a disease when you  have no symptoms. A diet and exercise plan that is right for you. What should I know about screenings and tests to prevent falls? Screening and testing are the best ways to find a health problem early. Early diagnosis and treatment give you the best chance of managing medical conditions that are common after age 58. Certain conditions and lifestyle choices may make you more likely to have a fall. Your health care provider may recommend: Regular vision checks. Poor vision and conditions such as cataracts can make you more likely to have a fall. If you wear glasses, make sure  to get your prescription updated if your vision changes. Medicine review. Work with your health care provider to regularly review all of the medicines you are taking, including over-the-counter medicines. Ask your health care provider about any side effects that may make you more likely to have a fall. Tell your health care provider if any medicines that you take make you feel dizzy or sleepy. Strength and balance checks. Your health care provider may recommend certain tests to check your strength and balance while standing, walking, or changing positions. Foot health exam. Foot pain and numbness, as well as not wearing proper footwear, can make you more likely to have a fall. Screenings, including: Osteoporosis screening. Osteoporosis is a condition that causes the bones to get weaker and break more easily. Blood pressure screening. Blood pressure changes and medicines to control blood pressure can make you feel dizzy. Depression screening. You may be more likely to have a fall if you have a fear of falling, feel depressed, or feel unable to do activities that you used to do. Alcohol use screening. Using too much alcohol can affect your balance and may make you more likely to have a fall. Follow these instructions at home: Lifestyle Do not drink alcohol if: Your health care provider tells you not to drink. If you drink alcohol: Limit how much you have to: 0-1 drink a day for women. 0-2 drinks a day for men. Know how much alcohol is in your drink. In the [place] one drink equals one 12 oz bottle of beer (355 mL), one 5 oz glass of wine (148 mL), or one 1 oz glass of hard liquor (44 mL). Do not use any products that contain nicotine or tobacco. These products include cigarettes, chewing tobacco, and vaping devices, such as e-cigarettes. If you need help quitting, ask your health care provider. Activity  Follow a regular exercise program to stay fit. This will help you maintain your balance. Ask  your health care provider what types of exercise are appropriate for you. If you need a cane or walker, use it as recommended by your health care provider. Wear supportive shoes that have nonskid soles. Safety  Remove any tripping hazards, such as rugs, cords, and clutter. Install safety equipment such as grab bars in bathrooms and safety rails on stairs. Keep rooms and walkways well-lit. General instructions Talk with your health care provider about your risks for falling. Tell your health care provider if: You fall. Be sure to tell your health care provider about all falls, even ones that seem minor. You feel dizzy, tiredness (fatigue), or off-balance. Take over-the-counter and prescription medicines only as told by your health care provider. These include supplements. Eat a healthy diet and maintain a healthy weight. A healthy diet includes low-fat dairy products, low-fat (lean) meats, and fiber from whole grains, beans, and lots of fruits and vegetables. Stay current with your vaccines. Schedule regular  health, dental, and eye exams. Summary Having a healthy lifestyle and getting preventive care can help to protect your health and wellness after age 61. Screening and testing are the best way to find a health problem early and help you avoid having a fall. Early diagnosis and treatment give you the best chance for managing medical conditions that are more common for people who are older than age 68. Falls are a major cause of broken bones and head injuries in people who are older than age 36. Take precautions to prevent a fall at home. Work with your health care provider to learn what changes you can make to improve your health and wellness and to prevent falls. This information is not intended to replace advice given to you by your health care provider. Make sure you discuss any questions you have with your health care provider. Document Revised: 11/16/2020 Document Reviewed:  11/16/2020 Elsevier Patient Education  2024 Elsevier Inc.     Edwina Barth, MD [facility]

## 2023-04-03 NOTE — Assessment & Plan Note (Signed)
BP Readings from Last 3 Encounters:  04/03/23 136/74  09/13/22 118/68  09/07/22 120/60  Well-controlled hypertension Continue lisinopril 40 mg daily Cardiovascular risks associated with hypertension discussed Diet and nutrition discussed

## 2023-04-04 LAB — CK: Total CK: 33 U/L (ref 32–182)

## 2023-04-18 ENCOUNTER — Other Ambulatory Visit: Payer: Self-pay | Admitting: Cardiology

## 2023-04-22 ENCOUNTER — Other Ambulatory Visit: Payer: Self-pay | Admitting: Cardiology

## 2023-04-23 ENCOUNTER — Other Ambulatory Visit: Payer: Self-pay | Admitting: Cardiology

## 2023-04-24 MED ORDER — SIMVASTATIN 40 MG PO TABS: 40.0000 mg | ORAL_TABLET | Freq: Every day | ORAL | 0 refills | Status: DC

## 2023-05-03 ENCOUNTER — Telehealth: Payer: Self-pay | Admitting: Cardiology

## 2023-05-03 NOTE — Telephone Encounter (Signed)
*  STAT* If patient is at the pharmacy, call can be transferred to refill team.   1. Which medications need to be refilled? (please list name of each medication and dose if known) Simvastatin   2. Would you like to learn more about the convenience, safety, & potential cost savings by using the [facility]?     3. Are you open to using the [facility] (Type [facility].    4. Which pharmacy/location (including street and city if local pharmacy) is medication to be sent to? [facility] [address], [place]   5. Do they need a 30 day or 90 day supply? Patient needs enough medicine until her appointment on 06-01-23

## 2023-05-05 ENCOUNTER — Other Ambulatory Visit: Payer: Self-pay

## 2023-05-05 ENCOUNTER — Encounter: Payer: Self-pay | Admitting: Cardiology

## 2023-05-05 MED ORDER — SIMVASTATIN 40 MG PO TABS: 40.0000 mg | ORAL_TABLET | Freq: Every day | ORAL | 0 refills | Status: DC

## 2023-05-05 NOTE — Telephone Encounter (Signed)
Simvastatin 40mg  refilled until appt.

## 2023-05-06 ENCOUNTER — Encounter: Payer: Self-pay | Admitting: Emergency Medicine

## 2023-05-08 ENCOUNTER — Other Ambulatory Visit: Payer: Self-pay

## 2023-05-08 MED ORDER — SIMVASTATIN 40 MG PO TABS: 40.0000 mg | ORAL_TABLET | Freq: Every day | ORAL | 0 refills | Status: DC

## 2023-05-08 NOTE — Telephone Encounter (Signed)
Called patient and she informed me that her prescription had been re-filled. Patient had no further questions at this time.

## 2023-05-10 ENCOUNTER — Ambulatory Visit: Payer: 59 | Admitting: Cardiology

## 2023-05-23 ENCOUNTER — Encounter: Payer: Self-pay | Admitting: Emergency Medicine

## 2023-05-31 ENCOUNTER — Other Ambulatory Visit: Payer: Self-pay

## 2023-05-31 DIAGNOSIS — I10 Essential (primary) hypertension: Secondary | ICD-10-CM | POA: Insufficient documentation

## 2023-05-31 DIAGNOSIS — G709 Myoneural disorder, unspecified: Secondary | ICD-10-CM | POA: Insufficient documentation

## 2023-05-31 DIAGNOSIS — E785 Hyperlipidemia, unspecified: Secondary | ICD-10-CM | POA: Insufficient documentation

## 2023-05-31 DIAGNOSIS — Z5189 Encounter for other specified aftercare: Secondary | ICD-10-CM | POA: Insufficient documentation

## 2023-06-01 ENCOUNTER — Encounter: Payer: Self-pay | Admitting: Cardiology

## 2023-06-01 ENCOUNTER — Ambulatory Visit: Payer: PPO | Attending: Cardiology | Admitting: Cardiology

## 2023-06-01 VITALS — BP 122/62 | HR 74 | Ht 63.0 in | Wt 151.0 lb

## 2023-06-01 DIAGNOSIS — E782 Mixed hyperlipidemia: Secondary | ICD-10-CM | POA: Diagnosis not present

## 2023-06-01 DIAGNOSIS — E785 Hyperlipidemia, unspecified: Secondary | ICD-10-CM

## 2023-06-01 DIAGNOSIS — I7789 Other specified disorders of arteries and arterioles: Secondary | ICD-10-CM

## 2023-06-01 DIAGNOSIS — I10 Essential (primary) hypertension: Secondary | ICD-10-CM | POA: Diagnosis not present

## 2023-06-01 MED ORDER — SIMVASTATIN 40 MG PO TABS: 40.0000 mg | ORAL_TABLET | Freq: Every day | ORAL | 3 refills | Status: DC

## 2023-06-01 NOTE — Progress Notes (Signed)
Cardiology Office Note:    Date:  06/01/2023   ID:  [name] [name], DOB 09-10-1955, MRN [id number]  PCP:  [name] Quint, MD  Cardiologist:  [name] Brothers, MD   Referring MD: [name] [name], *    ASSESSMENT:    1. Mixed hyperlipidemia   2. Essential hypertension   3. Enlarged thoracic aorta (HCC)   4. Dyslipidemia    PLAN:    In order of problems listed above:  Aortic atherosclerosis: Secondary prevention stressed with the patient.  Importance of compliance with diet medication stressed and she vocalized understanding.  She was advised to ambulate to the best of her ability. Aortic aneurysm: Stable results discussed with the patient.  We will review this and monitor on an annual basis. Mixed dyslipidemia: On lipid-lowering medications followed by primary care.  Lipids are at goal and I reviewed the KPN sheet. She requests lipid medications refilled.  I looked at her blood work and LFTs and lipid this. Patient will be seen in follow-up appointment in 6 months or earlier if the patient has any concerns.    Medication Adjustments/Labs and Tests Ordered: Current medicines are reviewed at length with the patient today.  Concerns regarding medicines are outlined above.  Orders Placed This Encounter  Procedures   Basic metabolic panel   EKG 12-Lead   Meds ordered this encounter  Medications   simvastatin (ZOCOR) 40 MG tablet    Sig: Take 1 tablet (40 mg total) by mouth daily at 6 PM.    Dispense:  90 tablet    Refill:  3    Final attempt, patient needs and appt for additional refills     No chief complaint on file.    History of Present Illness:    [name] [name] is a 67 y.o. female.  Patient has past medical history of aortic atherosclerosis and ascending aortic aneurysm.  He denies any problems at this time and takes care of activities of daily living.  No chest pain orthopnea or PND.  She leads a sedentary lifestyle because of orthopedic issues and  the fact that she has bilateral hip replacement.  At the time of my evaluation, the patient is alert awake oriented and in no distress.  Past Medical History:  Diagnosis Date   Abnormal echocardiogram 05/08/2019   Anemia    hx of, not at this time   ANXIETY DISORDER- mixed disorder w/ depressive symptoms 02/07/2014   Aortic regurgitation    Arthritis    "right hip; left hip; most of my joints" (05/27/2014)   Atherosclerosis of aorta (HCC) 03/08/2021   Blood transfusion without reported diagnosis    Phreesia 09/04/2020   Cancer (HCC)    [facility]reesia 09/04/2020   COLONIC POLYPS, ADENOMATOUS 02/20/2003   Qualifier: Diagnosis of   By: Gwinda Passe RN, Carissa         Depression    Diverticulosis    DIVERTICULOSIS, COLON 02/20/2003   Qualifier: Diagnosis of   By: Gwinda Passe RN, Carissa      Replacing diagnoses that were inactivated after the 10/10/22 regulatory import     Dyslipidemia 03/08/2021   Elevated cholesterol 02/14/2018   Enlarged thoracic aorta (HCC) 04/17/2019   Essential hypertension 10/13/2007   Qualifier: Diagnosis of   By: Gwinda Passe RN, Cloria [place]         Essential hypertension, benign    takes Lisinopril-HCTZ daily   Heart murmur    HERNIA, UMBILICAL 10/13/2007   Qualifier: Diagnosis of   By: Gwinda Passe RN,  Carissa      Replacing diagnoses that were inactivated after the 10/10/22 regulatory import     History of blood transfusion    no abnormal reaction noted 2015   History of colonic polyps 05/21/2021   History of malignant neoplasm of large intestine 02/20/2003   Qualifier: Diagnosis of   By: Gwinda Passe RN, Carissa         Hyperlipidemia    Phreesia 09/04/2020   Hypertension    Phreesia 09/04/2020   Insomnia    doesn't take any meds   Monoallelic mutation of SDHA gene 06/25/2021   Neuromuscular disorder (HCC)    Neuropathy    since chemo and radiation   Nocturia    Paresthesia of upper extremity 04/03/2023   PERIPHERAL NEUROPATHY 10/13/2007   Qualifier: Diagnosis of   By: Gwinda Passe  RN, Carissa      Replacing diagnoses that were inactivated after the 10/10/22 regulatory import     Prediabetes 03/06/2020   Rectosigmoid cancer (HCC) 2004   S/P Surgery, Chemo, Rad Tx,  (after chemo treatment menstral periods have ceased).   Status post bilateral total hip replacement 05/27/2014   Steatosis of liver 03/08/2021    Past Surgical History:  Procedure Laterality Date   ANAL RECTAL MANOMETRY N/A 07/17/2017   Procedure: ANO RECTAL MANOMETRY;  Surgeon: Napoleon Form, MD;  Location: [facility];  Service: Endoscopy;  Laterality: N/A;   COLECTOMY  Aug 2004   S/P Low Anterior Resxn (Sigmoid Colon)   COLON SURGERY N/A    Phreesia 09/04/2020   COLONOSCOPY     DILATION AND CURETTAGE OF UTERUS  1991   JOINT REPLACEMENT N/A    Phreesia 09/04/2020   LUMBAR DISC SURGERY  Feb 2007   SPINE SURGERY N/A    Phreesia 09/04/2020   TOTAL HIP ARTHROPLASTY Right 05/27/2014   Procedure: RIGHT TOTAL HIP ARTHROPLASTY ANTERIOR APPROACH;  Surgeon: Velna Ochs, MD;  Location: [facility] OR;  Service: Orthopedics;  Laterality: Right;   TOTAL HIP ARTHROPLASTY Left 08/25/2015   Procedure: TOTAL HIP ARTHROPLASTY ANTERIOR APPROACH;  Surgeon: Marcene Corning, MD;  Location: [facility] OR;  Service: Orthopedics;  Laterality: Left;    Current Medications: Current Meds  Medication Sig   Artificial Tear Ointment (DRY EYES OP) Apply 1 drop to eye as needed (dry eyes).   finasteride (PROPECIA) 1 MG tablet Take 1 mg by mouth daily.   gabapentin (NEURONTIN) 400 MG capsule Take 400 mg by mouth daily.   linaclotide (LINZESS) 290 MCG CAPS capsule Take 1 capsule (290 mcg total) by mouth daily before breakfast.   lisinopril (ZESTRIL) 40 MG tablet TAKE 1 TABLET BY MOUTH EVERY DAY   meloxicam (MOBIC) 15 MG tablet TAKE 1 TABLET (15 MG TOTAL) BY MOUTH DAILY.   Multiple Vitamins-Minerals (MULTIVITAMIN ADULT PO) Take 1 tablet by mouth daily.   [DISCONTINUED] simvastatin (ZOCOR) 40 MG tablet Take 1 tablet (40 mg total) by mouth  daily at 6 PM.   Current Facility-Administered Medications for the 06/01/23 encounter (Office Visit) with Osei Anger, Aundra Dubin, MD  Medication   0.9 %  sodium chloride infusion     Allergies:   Patient has no known allergies.   Social History   Socioeconomic History   Marital status: Married    Spouse name: Alinda Money   Number of children: 1   Years of education: Not on file   Highest education level: Master's degree (e.g., MA, MS, MEng, MEd, MSW, MBA)  Occupational History   Occupation: office work  Tobacco Use  Smoking status: Former    Types: Cigarettes    Passive exposure: Past   Smokeless tobacco: Never   Tobacco comments:    "smoked some socially in my college days"  Vaping Use   Vaping status: Never Used  Substance and Sexual Activity   Alcohol use: Yes    Comment: occasionally beer/wine   Drug use: No   Sexual activity: Yes    Partners: Male    Birth control/protection: None  Other Topics Concern   Not on file  Social History Narrative   Lives with husband   Exercise walking 2 x weekly   Patient's family history -  UNKNOWN ADOPTED   Social Determinants of Health   Financial Resource Strain: Low Risk  (03/31/2023)   Overall Financial Resource Strain (CARDIA)    Difficulty of Paying Living Expenses: Not hard at all  Food Insecurity: No Food Insecurity (03/31/2023)   Hunger Vital Sign    Worried About Running Out of Food in the Last Year: Never true    Ran Out of Food in the Last Year: Never true  Transportation Needs: No Transportation Needs (03/31/2023)   PRAPARE - Administrator, Civil Service (Medical): No    Lack of Transportation (Non-Medical): No  Physical Activity: Insufficiently Active (03/31/2023)   Exercise Vital Sign    Days of Exercise per Week: 3 days    Minutes of Exercise per Session: 30 min  Stress: No Stress Concern Present (03/31/2023)   [employer] of Occupational Health - Occupational Stress Questionnaire    Feeling of Stress  : Only a little  Social Connections: Moderately Integrated (03/31/2023)   Social Connection and Isolation Panel [NHANES]    Frequency of Communication with Friends and Family: More than three times a week    Frequency of Social Gatherings with Friends and Family: Once a week    Attends Religious Services: Never    Database administrator or Organizations: Yes    Attends Engineer, structural: More than 4 times per year    Marital Status: Married     Family History: The patient's family history is negative for Colitis, Colon cancer, Colon polyps, Esophageal cancer, Rectal cancer, and Stomach cancer. She was adopted.  ROS:   Please see the history of present illness.    All other systems reviewed and are negative.  EKGs/Labs/Other Studies Reviewed:    The following studies were reviewed today: .Marland KitchenEKG Interpretation Date/Time:  Thursday June 01 2023 10:12:21 EST Ventricular Rate:  74 PR Interval:  188 QRS Duration:  54 QT Interval:  384 QTC Calculation: 426 R Axis:   52  Text Interpretation: Normal sinus rhythm Normal ECG When compared with ECG of 28-Dec-2014 12:58, PREVIOUS ECG IS PRESENT Confirmed by Belva Crome [phone]) on 06/01/2023 10:30:03 AM     Recent Labs: 04/03/2023: ALT 18; BUN 35; Creatinine, Ser 1.12; Hemoglobin 11.8; Platelets 201.0; Potassium 5.5 No hemolysis seen; Sodium 130; TSH 1.85  Recent Lipid Panel    Component Value Date/Time   CHOL 164 04/03/2023 0931   CHOL 164 01/14/2021 1356   TRIG 105.0 04/03/2023 0931   HDL 83.20 04/03/2023 0931   HDL 68 01/14/2021 1356   CHOLHDL 2 04/03/2023 0931   VLDL 21.0 04/03/2023 0931   LDLCALC 60 04/03/2023 0931   LDLCALC 67 01/14/2021 1356    Physical Exam:    VS:  BP 122/62   Pulse 74   Ht 5\' 3"  (1.6 m)   Wt 151 lb (68.5  kg)   BMI 26.75 kg/m     Wt Readings from Last 3 Encounters:  06/01/23 151 lb (68.5 kg)  04/03/23 149 lb 8 oz (67.8 kg)  09/13/22 148 lb 8 oz (67.4 kg)     GEN: Patient is  in no acute distress HEENT: Normal NECK: No JVD; No carotid bruits LYMPHATICS: No lymphadenopathy CARDIAC: Hear sounds regular, 2/6 systolic murmur at the apex. RESPIRATORY:  Clear to auscultation without rales, wheezing or rhonchi  ABDOMEN: Soft, non-tender, non-distended MUSCULOSKELETAL:  No edema; No deformity  SKIN: Warm and dry NEUROLOGIC:  Alert and oriented x 3 PSYCHIATRIC:  Normal affect   Signed, [name] Brothers, MD  06/01/2023 11:07 AM    [facility]

## 2023-06-01 NOTE — Patient Instructions (Signed)
Medication Instructions:  Your physician recommends that you continue on your current medications as directed. Please refer to the Current Medication list given to you today.  *If you need a refill on your cardiac medications before your next appointment, please call your pharmacy*   Lab Work:  You will have a BMP drawn today.   If you have labs (blood work) drawn today and your tests are completely normal, you will receive your results only by: MyChart Message (if you have MyChart) OR A paper copy in the mail If you have any lab test that is abnormal or we need to change your treatment, we will call you to review the results.   Testing/Procedures: Non ordered   Follow-Up: At [facility], you and your health needs are our priority.  As part of our continuing mission to provide you with exceptional heart care, we have created designated Provider Care Teams.  These Care Teams include your primary Cardiologist (physician) and Advanced Practice Providers (APPs -  Physician Assistants and Nurse Practitioners) who all work together to provide you with the care you need, when you need it.  We recommend signing up for the patient portal called "MyChart".  Sign up information is provided on this After Visit Summary.  MyChart is used to connect with patients for Virtual Visits (Telemedicine).  Patients are able to view lab/test results, encounter notes, upcoming appointments, etc.  Non-urgent messages can be sent to your provider as well.   To learn more about what you can do with MyChart, go to [email or web address].    Your next appointment:   9 month(s)  Provider:   Belva Crome, MD

## 2023-06-02 LAB — BASIC METABOLIC PANEL
BUN/Creatinine Ratio: 16 (ref 12–28)
BUN: 15 mg/dL (ref 8–27)
CO2: 19 mmol/L — ABNORMAL LOW (ref 20–29)
Calcium: 9.6 mg/dL (ref 8.7–10.3)
Chloride: 96 mmol/L (ref 96–106)
Creatinine, Ser: 0.92 mg/dL (ref 0.57–1.00)
Glucose: 121 mg/dL — ABNORMAL HIGH (ref 70–99)
Potassium: 5.7 mmol/L — ABNORMAL HIGH (ref 3.5–5.2)
Sodium: 132 mmol/L — ABNORMAL LOW (ref 134–144)
eGFR: 69 mL/min/{1.73_m2} (ref >59)

## 2023-06-06 ENCOUNTER — Other Ambulatory Visit (HOSPITAL_COMMUNITY): Payer: Self-pay

## 2023-06-06 ENCOUNTER — Telehealth: Payer: Self-pay

## 2023-06-06 MED ORDER — SODIUM POLYSTYRENE SULFONATE PO POWD: Freq: Once | ORAL | 0 refills | Status: AC

## 2023-06-06 NOTE — Telephone Encounter (Signed)
-----   Message from Aundra Dubin Revankar sent at 06/06/2023  9:55 AM EST ----- He Kayexalate 30 g.  One-time dose only.  Please bring patient back for blood work.  First thing tomorrow morning copy primary care Garwin Brothers, MD 06/06/2023 9:55 AM

## 2023-06-06 NOTE — Telephone Encounter (Signed)
Results reviewed with pt as per Dr. Revankar's note.  Pt verbalized understanding and had no additional questions. Routed to PCP.  

## 2023-06-07 MED ORDER — LOKELMA 10 G PO PACK: PACK | ORAL | Status: DC

## 2023-06-07 NOTE — Addendum Note (Signed)
Addended by: Eleonore Chiquito on: 06/07/2023 08:54 AM   Modules accepted: Orders

## 2023-06-12 ENCOUNTER — Encounter: Payer: Self-pay | Admitting: Emergency Medicine

## 2023-06-12 ENCOUNTER — Ambulatory Visit (INDEPENDENT_AMBULATORY_CARE_PROVIDER_SITE_OTHER): Payer: PPO | Admitting: Emergency Medicine

## 2023-06-12 VITALS — BP 130/72 | HR 68 | Temp 97.5°F | Ht 63.0 in | Wt 151.0 lb

## 2023-06-12 DIAGNOSIS — G8929 Other chronic pain: Secondary | ICD-10-CM

## 2023-06-12 DIAGNOSIS — I7789 Other specified disorders of arteries and arterioles: Secondary | ICD-10-CM

## 2023-06-12 DIAGNOSIS — I10 Essential (primary) hypertension: Secondary | ICD-10-CM | POA: Diagnosis not present

## 2023-06-12 DIAGNOSIS — R7303 Prediabetes: Secondary | ICD-10-CM | POA: Diagnosis not present

## 2023-06-12 DIAGNOSIS — E785 Hyperlipidemia, unspecified: Secondary | ICD-10-CM

## 2023-06-12 DIAGNOSIS — I700 Atherosclerosis of aorta: Secondary | ICD-10-CM

## 2023-06-12 DIAGNOSIS — K760 Fatty (change of) liver, not elsewhere classified: Secondary | ICD-10-CM | POA: Diagnosis not present

## 2023-06-12 DIAGNOSIS — C19 Malignant neoplasm of rectosigmoid junction: Secondary | ICD-10-CM | POA: Diagnosis not present

## 2023-06-12 DIAGNOSIS — E875 Hyperkalemia: Secondary | ICD-10-CM | POA: Insufficient documentation

## 2023-06-12 DIAGNOSIS — R519 Headache, unspecified: Secondary | ICD-10-CM

## 2023-06-12 LAB — BASIC METABOLIC PANEL
BUN: 22 mg/dL (ref 6–23)
CO2: 25 meq/L (ref 19–32)
Calcium: 9.4 mg/dL (ref 8.4–10.5)
Chloride: 99 meq/L (ref 96–112)
Creatinine, Ser: 1.04 mg/dL (ref 0.40–1.20)
GFR: 55.89 mL/min — ABNORMAL LOW (ref >60.00)
Glucose, Bld: 100 mg/dL — ABNORMAL HIGH (ref 70–99)
Potassium: 5.2 meq/L — ABNORMAL HIGH (ref 3.5–5.1)
Sodium: 132 meq/L — ABNORMAL LOW (ref 135–145)

## 2023-06-12 NOTE — Assessment & Plan Note (Signed)
Stable.  Continues surveillance by cardiology

## 2023-06-12 NOTE — Assessment & Plan Note (Signed)
Diet and nutrition discussed.  Continue simvastatin 40 mg daily.

## 2023-06-12 NOTE — Patient Instructions (Signed)
Hyperkalemia Hyperkalemia is when you have too much of a mineral called potassium in your blood. If there is too much potassium in your blood, it can affect how your heart works. Potassium is normally removed from your body by your kidneys. What are the causes? Taking in too much potassium. This can happen if: You use salt substitutes. You take potassium supplements. You eat too many foods that are high in potassium if you have kidney disease. Your body not being able to get rid of potassium. This can happen if: Your kidneys are not working properly. You are taking certain medicines. You have a condition called Addison's disease. You have kidney stones. You are on treatment to clean your blood (dialysis) and you skip a treatment. Your cells releasing a high amount of potassium into the blood. This can happen if: You have a muscle injury. You have very bad burns or infections. You have problems with your blood plasma. This can be caused by diabetes that is not well controlled. What increases the risk? Drinking too much alcohol. Using drugs a lot. What are the signs or symptoms? In many cases, there are no symptoms. But, when your potassium level becomes high enough, you may have symptoms such as: An irregular heartbeat. A very slow heartbeat. Feeling like you may vomit (nauseous). Tiredness. Confusion. Tingling of your skin. Numbness of your hands or feet. Muscle cramps. Muscle weakness. Not being able to move (paralysis). How is this treated? Treatment depends on how bad your condition is. Treatment may need to be done in the hospital. It may include: Receiving a fluid with sugar (glucose) in it through an IV tube, along with insulin. Taking medicines. Having treatment to clean your blood. Taking calcium. Follow these instructions at home:  Take over-the-counter and prescription medicines only as told by your doctor. Do not take any of the following unless your doctor says it  is okay: Supplements. Natural food products. Herbs. Vitamins. If you drink alcohol, limit how much you have as told by your doctor. Do not use illegal drugs. If you need help quitting, ask your doctor. If you have kidney disease, you may need to follow a low-potassium diet. A food expert (dietitian) can help you. Keep all follow-up visits. Contact a doctor if: Your heartbeat is not regular or is very slow. You feel dizzy. You feel weak. You feel like you may vomit. You have tingling in your hands or feet. You lose feeling in your hands or feet. Get help right away if: You are short of breath. You have chest pain. You faint. You cannot move your muscles. These symptoms may be an emergency. Get help right away. Call 911. Do not wait to see if the symptoms will go away. Do not drive yourself to the hospital. Summary Hyperkalemia is when you have too much potassium in your blood. Take over-the-counter and prescription medicines only as told by your doctor. Limit how much alcohol you have as told by your doctor. Contact a doctor if your heartbeat is not regular. This information is not intended to replace advice given to you by your health care provider. Make sure you discuss any questions you have with your health care provider. Document Revised: 03/11/2021 Document Reviewed: 03/11/2021 Elsevier Patient Education  2024 [employer].

## 2023-06-12 NOTE — Progress Notes (Signed)
[name] [name] 67 y.o.   Chief Complaint  Patient presents with   Referral    Her previous headache Dr. Is not in network and wants someone in networks.  Patient also states at her last visit with cardiologist theu stated her potassium was high ans wanted her to take Kayexalate which she could not find at any pharmacy they gave her a one day dose. She was told her PCP needs to follow up with this     HISTORY OF PRESENT ILLNESS: This is a 67 y.o. female A1A complaining of increased potassium level on 06/01/2023 Measured at 5.7.  It was 5.5 on 04/03/2023 Seen by cardiologist last week.  Was given 2 doses of Kayexalate and advised to follow-up with PCP Upon further questioning patient states she started taking spironolactone about 1 year ago as prescribed by dermatologist for hair loss Also taking lisinopril for hypertension Also has history of intermittent headaches.  Requesting referral to neurologist.  HPI   Prior to Admission medications   Medication Sig Start Date End Date Taking? Authorizing Provider  Artificial Tear Ointment (DRY EYES OP) Apply 1 drop to eye as needed (dry eyes).   Yes [provider]  finasteride (PROPECIA) 1 MG tablet Take 1 mg by mouth daily. 10/29/21  Yes [provider]  gabapentin (NEURONTIN) 400 MG capsule Take 400 mg by mouth daily. 08/28/20  Yes [provider]  linaclotide (LINZESS) 290 MCG CAPS capsule Take 1 capsule (290 mcg total) by mouth daily before breakfast. 09/07/22  Yes Nandigam, Eleonore Chiquito, MD  LINZESS 145 MCG CAPS capsule TAKE 1 CAPSULE BY MOUTH DAILY BEFORE BREAKFAST. 12/30/22  Yes Nandigam, Kavitha V, MD  lisinopril (ZESTRIL) 40 MG tablet TAKE 1 TABLET BY MOUTH EVERY DAY 10/30/22  Yes Va Broadwell, Eilleen Kempf, MD  meloxicam (MOBIC) 15 MG tablet TAKE 1 TABLET (15 MG TOTAL) BY MOUTH DAILY. 01/04/23  Yes Zaniah Titterington, Eilleen Kempf, MD  Multiple Vitamins-Minerals (MULTIVITAMIN ADULT PO) Take 1 tablet by mouth daily.   Yes [provider]  simvastatin (ZOCOR) 40 MG tablet Take 1 tablet (40 mg total) by mouth daily at 6 PM. 06/01/23  Yes Revankar, Aundra Dubin, MD  sodium zirconium cyclosilicate (LOKELMA) 10 g PACK packet Take 10 g this am and this pm. 06/07/23  Yes Revankar, Aundra Dubin, MD  colesevelam (WELCHOL) 625 MG tablet TAKE 1 TABLET (625 MG TOTAL) BY MOUTH 3 (THREE) TIMES DAILY AS NEEDED. Patient not taking: Reported on 06/01/2023 03/04/22   Napoleon Form, MD  dicyclomine (BENTYL) 10 MG capsule TAKE 1 CAPSULE (10 MG TOTAL) BY MOUTH 3 (THREE) TIMES DAILY AS NEEDED FOR SPASMS. Patient not taking: Reported on 04/03/2023 03/04/22   Napoleon Form, MD    No Known Allergies  Patient Active Problem List   Diagnosis Date Noted   Hyperkalemia 06/12/2023   Blood transfusion without reported diagnosis    Essential hypertension, benign    Hypertension    Hyperlipidemia    Neuromuscular disorder (HCC)    Paresthesia of upper extremity 04/03/2023   Nocturia 10/25/2021   Neuropathy 10/25/2021   Insomnia 10/25/2021   History of blood transfusion 10/25/2021   Heart murmur 10/25/2021   Diverticulosis 10/25/2021   Depression 10/25/2021   Cancer (HCC) 10/25/2021   Arthritis 10/25/2021   Aortic regurgitation 10/25/2021   Anemia 10/25/2021   Monoallelic mutation of SDHA gene 06/25/2021   History of colonic polyps 05/21/2021   Atherosclerosis of aorta (HCC) 03/08/2021   Steatosis of liver 03/08/2021  Dyslipidemia 03/08/2021   Prediabetes 03/06/2020   Abnormal echocardiogram 05/08/2019   Enlarged thoracic aorta (HCC) 04/17/2019   Elevated cholesterol 02/14/2018   Status post bilateral total hip replacement 05/27/2014   ANXIETY DISORDER- mixed disorder w/ depressive symptoms 02/07/2014   PERIPHERAL NEUROPATHY 10/13/2007   Essential hypertension 10/13/2007   HERNIA, UMBILICAL 10/13/2007   COLONIC POLYPS, ADENOMATOUS 02/20/2003   DIVERTICULOSIS, COLON 02/20/2003   History of malignant neoplasm of large  intestine 02/20/2003   Rectosigmoid cancer (HCC) 2004    Past Medical History:  Diagnosis Date   Abnormal echocardiogram 05/08/2019   Anemia    hx of, not at this time   ANXIETY DISORDER- mixed disorder w/ depressive symptoms 02/07/2014   Aortic regurgitation    Arthritis    "right hip; left hip; most of my joints" (05/27/2014)   Atherosclerosis of aorta (HCC) 03/08/2021   Blood transfusion without reported diagnosis    [facility] 09/04/2020   Cancer (HCC)    [facility] 09/04/2020   COLONIC POLYPS, ADENOMATOUS 02/20/2003   Qualifier: Diagnosis of   By: Gwinda Passe RN, Carissa         Depression    Diverticulosis    DIVERTICULOSIS, COLON 02/20/2003   Qualifier: Diagnosis of   By: Gwinda Passe RN, Carissa      Replacing diagnoses that were inactivated after the 10/10/22 regulatory import     Dyslipidemia 03/08/2021   Elevated cholesterol 02/14/2018   Enlarged thoracic aorta (HCC) 04/17/2019   Essential hypertension 10/13/2007   Qualifier: Diagnosis of   By: Gwinda Passe RN, Carissa         Essential hypertension, benign    takes Lisinopril-HCTZ daily   Heart murmur    HERNIA, UMBILICAL 10/13/2007   Qualifier: Diagnosis of   By: Gwinda Passe RN, Carissa      Replacing diagnoses that were inactivated after the 10/10/22 regulatory import     History of blood transfusion    no abnormal reaction noted 2015   History of colonic polyps 05/21/2021   History of malignant neoplasm of large intestine 02/20/2003   Qualifier: Diagnosis of   By: Gwinda Passe RN, Carissa         Hyperlipidemia    [facility] 09/04/2020   Hypertension    [facility] 09/04/2020   Insomnia    doesn't take any meds   Monoallelic mutation of SDHA gene 06/25/2021   Neuromuscular disorder (HCC)    Neuropathy    since chemo and radiation   Nocturia    Paresthesia of upper extremity 04/03/2023   PERIPHERAL NEUROPATHY 10/13/2007   Qualifier: Diagnosis of   By: Gwinda Passe RN, Carissa      Replacing diagnoses that were inactivated after the 10/10/22  regulatory import     Prediabetes 03/06/2020   Rectosigmoid cancer (HCC) 2004   S/P Surgery, Chemo, Rad Tx,  (after chemo treatment menstral periods have ceased).   Status post bilateral total hip replacement 05/27/2014   Steatosis of liver 03/08/2021    Past Surgical History:  Procedure Laterality Date   ANAL RECTAL MANOMETRY N/A 07/17/2017   Procedure: ANO RECTAL MANOMETRY;  Surgeon: Napoleon Form, MD;  Location: [facility];  Service: Endoscopy;  Laterality: N/A;   COLECTOMY  Aug 2004   S/P Low Anterior Resxn (Sigmoid Colon)   COLON SURGERY N/A    [facility] 09/04/2020   COLONOSCOPY     DILATION AND CURETTAGE OF UTERUS  1991   JOINT REPLACEMENT N/A    [facility] 09/04/2020   LUMBAR DISC SURGERY  Feb 2007  SPINE SURGERY N/A    [facility] 09/04/2020   TOTAL HIP ARTHROPLASTY Right 05/27/2014   Procedure: RIGHT TOTAL HIP ARTHROPLASTY ANTERIOR APPROACH;  Surgeon: Velna Ochs, MD;  Location: [facility] OR;  Service: Orthopedics;  Laterality: Right;   TOTAL HIP ARTHROPLASTY Left 08/25/2015   Procedure: TOTAL HIP ARTHROPLASTY ANTERIOR APPROACH;  Surgeon: Marcene Corning, MD;  Location: [facility] OR;  Service: Orthopedics;  Laterality: Left;    Social History   Socioeconomic History   Marital status: Married    Spouse name: Alinda Money   Number of children: 1   Years of education: Not on file   Highest education level: Master's degree (e.g., MA, MS, MEng, MEd, MSW, MBA)  Occupational History   Occupation: office work  Tobacco Use   Smoking status: Former    Types: Cigarettes    Passive exposure: Past   Smokeless tobacco: Never   Tobacco comments:    "smoked some socially in my college days"  Vaping Use   Vaping status: Never Used  Substance and Sexual Activity   Alcohol use: Yes    Comment: occasionally beer/wine   Drug use: No   Sexual activity: Yes    Partners: Male    Birth control/protection: None  Other Topics Concern   Not on file  Social History Narrative   Lives with husband    Exercise walking 2 x weekly   Patient's family history -  UNKNOWN ADOPTED   Social Determinants of Health   Financial Resource Strain: Low Risk  (03/31/2023)   Overall Financial Resource Strain (CARDIA)    Difficulty of Paying Living Expenses: Not hard at all  Food Insecurity: No Food Insecurity (03/31/2023)   Hunger Vital Sign    Worried About Running Out of Food in the Last Year: Never true    Ran Out of Food in the Last Year: Never true  Transportation Needs: No Transportation Needs (03/31/2023)   PRAPARE - Administrator, Civil Service (Medical): No    Lack of Transportation (Non-Medical): No  Physical Activity: Insufficiently Active (03/31/2023)   Exercise Vital Sign    Days of Exercise per Week: 3 days    Minutes of Exercise per Session: 30 min  Stress: No Stress Concern Present (03/31/2023)   [employer] of Occupational Health - Occupational Stress Questionnaire    Feeling of Stress : Only a little  Social Connections: Moderately Integrated (03/31/2023)   Social Connection and Isolation Panel [NHANES]    Frequency of Communication with Friends and Family: More than three times a week    Frequency of Social Gatherings with Friends and Family: Once a week    Attends Religious Services: Never    Database administrator or Organizations: Yes    Attends Engineer, structural: More than 4 times per year    Marital Status: Married  Catering manager Violence: Not on file    Family History  Adopted: Yes  Problem Relation Age of Onset   Colitis Neg Hx    Colon cancer Neg Hx    Colon polyps Neg Hx    Esophageal cancer Neg Hx    Rectal cancer Neg Hx    Stomach cancer Neg Hx      Review of Systems  Constitutional: Negative.  Negative for chills and fever.  HENT: Negative.  Negative for congestion and sore throat.   Respiratory: Negative.  Negative for cough and shortness of breath.   Cardiovascular: Negative.  Negative for chest pain and palpitations.  Gastrointestinal:  Negative for abdominal pain, diarrhea, nausea and vomiting.  Genitourinary: Negative.  Negative for dysuria.  Skin: Negative.  Negative for rash.  Neurological:  Positive for headaches.  All other systems reviewed and are negative.   Vitals:   06/12/23 1332  BP: 130/72  Pulse: 68  Temp: (!) 97.5 F (36.4 C)  SpO2: 98%    Physical Exam Vitals reviewed.  Constitutional:      Appearance: Normal appearance.  HENT:     Head: Normocephalic.  Eyes:     Extraocular Movements: Extraocular movements intact.  Cardiovascular:     Rate and Rhythm: Normal rate and regular rhythm.     Pulses: Normal pulses.     Heart sounds: Normal heart sounds.  Pulmonary:     Effort: Pulmonary effort is normal.     Breath sounds: Normal breath sounds.  Skin:    General: Skin is warm and dry.     Capillary Refill: Capillary refill takes less than 2 seconds.  Neurological:     General: No focal deficit present.     Mental Status: She is alert and oriented to person, place, and time.  Psychiatric:        Mood and Affect: Mood normal.        Behavior: Behavior normal.      ASSESSMENT & PLAN: A total of 44 minutes was spent with the patient and counseling/coordination of care regarding preparing for this visit, review of most recent office visit notes, review of multiple chronic medical conditions and their management, diagnosis of hyperkalemia and management, review of all medications and changes made, review of most recent bloodwork results, review of health maintenance items, education on nutrition, prognosis, documentation, and need for follow up.   Problem List Items Addressed This Visit       Cardiovascular and Mediastinum   Essential hypertension    BP Readings from Last 3 Encounters:  06/12/23 130/72  06/01/23 122/62  04/03/23 136/74  Well-controlled hypertension Continue lisinopril 40 mg daily Cardiovascular risks associated with hypertension discussed Diet and  nutrition discussed      Enlarged thoracic aorta (HCC)    Stable.  Continues surveillance by cardiology      Atherosclerosis of aorta (HCC)    Diet and nutrition discussed Continue simvastatin 40 mg daily        Digestive   Steatosis of liver    Diet and nutrition discussed Continue simvastatin 40 mg daily      Rectosigmoid cancer (HCC)    Clinically stable.  No concerns.        Other   Prediabetes    Diet and nutrition discussed Cardiovascular risk associated with diabetes discussed      Dyslipidemia    Diet and nutrition discussed Continue simvastatin 40 mg daily      Hyperkalemia - Primary    Secondary to spironolactone use which she started using about a year ago for her loss.  Prescribed by dermatologist Took Kayexalate 2 doses last week We will repeat potassium level today Advised to stop spironolactone      Relevant Orders   Basic metabolic panel   Other Visit Diagnoses     Chronic nonintractable headache, unspecified headache type       Relevant Orders   Ambulatory referral to Neurology        Patient Instructions  Hyperkalemia Hyperkalemia is when you have too much of a mineral called potassium in your blood. If there is too much potassium in your blood,  it can affect how your heart works. Potassium is normally removed from your body by your kidneys. What are the causes? Taking in too much potassium. This can happen if: You use salt substitutes. You take potassium supplements. You eat too many foods that are high in potassium if you have kidney disease. Your body not being able to get rid of potassium. This can happen if: Your kidneys are not working properly. You are taking certain medicines. You have a condition called Addison's disease. You have kidney stones. You are on treatment to clean your blood (dialysis) and you skip a treatment. Your cells releasing a high amount of potassium into the blood. This can happen if: You have a  muscle injury. You have very bad burns or infections. You have problems with your blood plasma. This can be caused by diabetes that is not well controlled. What increases the risk? Drinking too much alcohol. Using drugs a lot. What are the signs or symptoms? In many cases, there are no symptoms. But, when your potassium level becomes high enough, you may have symptoms such as: An irregular heartbeat. A very slow heartbeat. Feeling like you may vomit (nauseous). Tiredness. Confusion. Tingling of your skin. Numbness of your hands or feet. Muscle cramps. Muscle weakness. Not being able to move (paralysis). How is this treated? Treatment depends on how bad your condition is. Treatment may need to be done in the hospital. It may include: Receiving a fluid with sugar (glucose) in it through an IV tube, along with insulin. Taking medicines. Having treatment to clean your blood. Taking calcium. Follow these instructions at home:  Take over-the-counter and prescription medicines only as told by your doctor. Do not take any of the following unless your doctor says it is okay: Supplements. Natural food products. Herbs. Vitamins. If you drink alcohol, limit how much you have as told by your doctor. Do not use illegal drugs. If you need help quitting, ask your doctor. If you have kidney disease, you may need to follow a low-potassium diet. A food expert (dietitian) can help you. Keep all follow-up visits. Contact a doctor if: Your heartbeat is not regular or is very slow. You feel dizzy. You feel weak. You feel like you may vomit. You have tingling in your hands or feet. You lose feeling in your hands or feet. Get help right away if: You are short of breath. You have chest pain. You faint. You cannot move your muscles. These symptoms may be an emergency. Get help right away. Call 911. Do not wait to see if the symptoms will go away. Do not drive yourself to the  hospital. Summary Hyperkalemia is when you have too much potassium in your blood. Take over-the-counter and prescription medicines only as told by your doctor. Limit how much alcohol you have as told by your doctor. Contact a doctor if your heartbeat is not regular. This information is not intended to replace advice given to you by your health care provider. Make sure you discuss any questions you have with your health care provider. Document Revised: 03/11/2021 Document Reviewed: 03/11/2021 Elsevier Patient Education  2024 Elsevier Inc.      Edwina Barth, MD [facility]

## 2023-06-12 NOTE — Assessment & Plan Note (Signed)
Secondary to spironolactone use which she started using about a year ago for her loss.  Prescribed by dermatologist Took Kayexalate 2 doses last week We will repeat potassium level today Advised to stop spironolactone

## 2023-06-12 NOTE — Assessment & Plan Note (Signed)
Diet and nutrition discussed. Cardiovascular risk associated with diabetes discussed.

## 2023-06-12 NOTE — Assessment & Plan Note (Signed)
Clinically stable.  No concerns.

## 2023-06-12 NOTE — Assessment & Plan Note (Signed)
BP Readings from Last 3 Encounters:  06/12/23 130/72  06/01/23 122/62  04/03/23 136/74  Well-controlled hypertension Continue lisinopril 40 mg daily Cardiovascular risks associated with hypertension discussed Diet and nutrition discussed

## 2023-06-13 ENCOUNTER — Other Ambulatory Visit: Payer: Self-pay | Admitting: Emergency Medicine

## 2023-06-13 DIAGNOSIS — E875 Hyperkalemia: Secondary | ICD-10-CM

## 2023-06-20 DIAGNOSIS — M545 Low back pain, unspecified: Secondary | ICD-10-CM | POA: Diagnosis not present

## 2023-06-26 DIAGNOSIS — G629 Polyneuropathy, unspecified: Secondary | ICD-10-CM | POA: Diagnosis not present

## 2023-06-26 DIAGNOSIS — K5900 Constipation, unspecified: Secondary | ICD-10-CM | POA: Diagnosis not present

## 2023-06-26 DIAGNOSIS — E538 Deficiency of other specified B group vitamins: Secondary | ICD-10-CM | POA: Diagnosis not present

## 2023-06-26 DIAGNOSIS — M1990 Unspecified osteoarthritis, unspecified site: Secondary | ICD-10-CM | POA: Diagnosis not present

## 2023-06-26 DIAGNOSIS — H04123 Dry eye syndrome of bilateral lacrimal glands: Secondary | ICD-10-CM | POA: Diagnosis not present

## 2023-06-26 DIAGNOSIS — E663 Overweight: Secondary | ICD-10-CM | POA: Diagnosis not present

## 2023-06-26 DIAGNOSIS — M5030 Other cervical disc degeneration, unspecified cervical region: Secondary | ICD-10-CM | POA: Diagnosis not present

## 2023-06-26 DIAGNOSIS — R519 Headache, unspecified: Secondary | ICD-10-CM | POA: Diagnosis not present

## 2023-06-26 DIAGNOSIS — N959 Unspecified menopausal and perimenopausal disorder: Secondary | ICD-10-CM | POA: Diagnosis not present

## 2023-06-26 DIAGNOSIS — G8929 Other chronic pain: Secondary | ICD-10-CM | POA: Diagnosis not present

## 2023-06-26 DIAGNOSIS — E785 Hyperlipidemia, unspecified: Secondary | ICD-10-CM | POA: Diagnosis not present

## 2023-06-26 DIAGNOSIS — G620 Drug-induced polyneuropathy: Secondary | ICD-10-CM | POA: Diagnosis not present

## 2023-06-27 ENCOUNTER — Other Ambulatory Visit (INDEPENDENT_AMBULATORY_CARE_PROVIDER_SITE_OTHER): Payer: PPO

## 2023-06-27 DIAGNOSIS — E875 Hyperkalemia: Secondary | ICD-10-CM | POA: Diagnosis not present

## 2023-06-27 LAB — BASIC METABOLIC PANEL
BUN: 16 mg/dL (ref 6–23)
CO2: 27 meq/L (ref 19–32)
Calcium: 9 mg/dL (ref 8.4–10.5)
Chloride: 102 meq/L (ref 96–112)
Creatinine, Ser: 0.83 mg/dL (ref 0.40–1.20)
GFR: 73.24 mL/min (ref >60.00)
Glucose, Bld: 124 mg/dL — ABNORMAL HIGH (ref 70–99)
Potassium: 4.9 meq/L (ref 3.5–5.1)
Sodium: 136 meq/L (ref 135–145)

## 2023-07-18 DIAGNOSIS — M7061 Trochanteric bursitis, right hip: Secondary | ICD-10-CM | POA: Diagnosis not present

## 2023-07-18 DIAGNOSIS — M545 Low back pain, unspecified: Secondary | ICD-10-CM | POA: Diagnosis not present

## 2023-07-24 ENCOUNTER — Encounter: Payer: Self-pay | Admitting: Gastroenterology

## 2023-07-24 ENCOUNTER — Ambulatory Visit: Payer: PPO | Admitting: Gastroenterology

## 2023-07-24 VITALS — BP 122/68 | HR 76 | Ht 63.0 in | Wt 155.0 lb

## 2023-07-24 DIAGNOSIS — R140 Abdominal distension (gaseous): Secondary | ICD-10-CM

## 2023-07-24 DIAGNOSIS — R109 Unspecified abdominal pain: Secondary | ICD-10-CM

## 2023-07-24 DIAGNOSIS — K5909 Other constipation: Secondary | ICD-10-CM | POA: Diagnosis not present

## 2023-07-24 DIAGNOSIS — M6289 Other specified disorders of muscle: Secondary | ICD-10-CM

## 2023-07-24 DIAGNOSIS — Z85038 Personal history of other malignant neoplasm of large intestine: Secondary | ICD-10-CM | POA: Diagnosis not present

## 2023-07-24 DIAGNOSIS — K469 Unspecified abdominal hernia without obstruction or gangrene: Secondary | ICD-10-CM

## 2023-07-24 DIAGNOSIS — K582 Mixed irritable bowel syndrome: Secondary | ICD-10-CM

## 2023-07-24 NOTE — Patient Instructions (Addendum)
 VISIT SUMMARY:  During today's visit, we discussed your ongoing issues with alternating diarrhea and constipation, as well as your abdominal pain, bloating, and hernia. We also reviewed your history of colon cancer and your recent colonoscopy. Additionally, we touched on your pelvic floor dysfunction, arthritis, and occasional swollen ankles.  YOUR PLAN:  -CHRONIC CONSTIPATION: Chronic constipation is a condition where you have infrequent bowel movements or difficulty passing stools. To manage this, continue taking Linzess  daily, reduce the use of Imodium and only use it sparingly when necessary. Increase your Benefiber intake to three times daily to help bulk your stool. Consider using Miralax 1-2 capful as needed on days when you take Imodium to prevent constipation.  -ABDOMINAL HERNIA: An abdominal hernia occurs when an internal part of the body pushes through a weakness in the muscle or surrounding tissue wall. Currently, there is no need for intervention, but we will monitor for any changes.  -COLON CANCER SURVEILLANCE: Given your history of colon cancer, it is important to have regular screenings. Your last colonoscopy was in November 2023 with no findings. We plan to schedule your next colonoscopy in 2026.  -PELVIC FLOOR DYSFUNCTION: Pelvic floor dysfunction involves the inability to correctly relax and coordinate your pelvic floor muscles. If it has been over a year since your last physical therapy session, consider scheduling refresher sessions.  -ARTHRITIS: Arthritis is inflammation of the joints, which can cause pain and affect mobility. We did not discuss a specific plan for this issue today.  -SWOLLEN ANKLES: Swollen ankles can be caused by various factors, including standing for long periods or hot weather. We did not discuss a specific plan for this issue today.  INSTRUCTIONS:  Please follow the outlined plan for managing your chronic constipation, including the adjustments to your  medication and fiber intake. Monitor your hernia for any changes and consider scheduling refresher physical therapy sessions for your pelvic floor dysfunction if needed. We will plan for your next colonoscopy in 2026, but you may opt to have it earlier in 2025. If you have any new symptoms or concerns, please schedule a follow-up appointment.  6 month follow up   I appreciate the  opportunity to care for you  Thank You   Kavitha Nandigam , MD

## 2023-07-24 NOTE — Progress Notes (Signed)
 [name] [name]    [id number]    30-Jun-1956  Primary Care Physician:Sagardia, Emil Schanz, MD  Referring Physician: Purcell Emil Schanz, MD [address] [place],  [place] [address]   Chief complaint: IBS constipation  Discussed the use of AI scribe software for clinical note transcription with the patient, who gave verbal consent to proceed.  History of Present Illness   68 year old very pleasant female with history of colon cancer status post resection , pelvic floor dysfunction, and a hernia, presents with ongoing issues of alternating diarrhea and constipation. She reports taking Linzess  daily, but also self-medicates with Imodium and laxatives as needed, which seems to contribute to a cycle of constipation and diarrhea. The patient experiences diarrhea a couple of times a week, and when taking Linzess , she reports multiple bowel movements in quick succession, up to four times in a row.  The patient also describes a sensation of incomplete evacuation and a painful urge to defecate. She reports a recent episode of severe abdominal pain, faintness, and vomiting after taking laxatives, which resolved the following day.  In addition to these symptoms, the patient reports a persistent pain in the lower abdomen and a sensation of bloating. She also mentions a hernia, which is a source of concern for her.  The patient has been managing her symptoms with a combination of Linzess , Imodium, laxatives, and Benefiber. She expresses a desire to better manage her symptoms and break the cycle of constipation and diarrhea.  The patient also reports issues with walking, which she initially attributed to her bowel issues. However, a recent visit to an orthopedic doctor revealed arthritis in her spine, which may be contributing to her mobility issues.  The patient has a history of colon cancer and is keen to maintain regular colonoscopy screenings. The most recent colonoscopy was in  November 2023, and the patient expresses a preference for a three-year interval for future screenings.       GI Hx:   Colonoscopy 05/12/22 - The perianal and digital rectal examinations were normal. - A 3 mm polyp was found in the ascending colon. The polyp was sessile. The polyp was removed with a cold snare. Resection and retrieval were complete. - Scattered small-mouthed diverticula were found in the sigmoid colon. - There was evidence of a prior end-to-end colo-colonic anastomosis in the recto-sigmoid colon. This was patent and was characterized by healthy appearing mucosa. - Non-bleeding external and internal hemorrhoids were found during retroflexion. The hemorrhoids were small   US  abdomen RUQ 05/12/21 IMPRESSION: ULTRASOUND RUQ:   Hepatic steatosis.  No focal liver lesion.   Colonoscopy April 25, 2019: - Mild diverticulosis in the descending colon. - Non-bleeding internal hemorrhoids. - Patent functional end-to-end colo-colonic anastomosis, characterized by healthy appearing mucosa. - No specimens   Colonoscopy March 29, 2018: 3 polyps (tubular adenoma and sessile serrated polyp) 5 to 11 mm in size removed from descending and transverse colon with cold snare.  2 polyps (tubular adenoma ) less than 5 mm in size removed with forceps.  Left-sided diverticulosis.  Functional colocolonic anastomosis.   Anorectal manometry July 17, 2017 showed weak anal sphincter and dyssynergic defecation She subsequently did pelvic floor physical therapy with no significant improvement of symptoms  Outpatient Encounter Medications as of 07/24/2023  Medication Sig   Artificial Tear Ointment (DRY EYES OP) Apply 1 drop to eye as needed (dry eyes).   finasteride (PROPECIA) 1 MG tablet Take 1 mg by  mouth daily.   gabapentin  (NEURONTIN ) 400 MG capsule Take 400 mg by mouth daily.   linaclotide  (LINZESS ) 290 MCG CAPS capsule Take 1 capsule (290 mcg total) by mouth daily before breakfast.   lisinopril   (ZESTRIL ) 40 MG tablet TAKE 1 TABLET BY MOUTH EVERY DAY   meloxicam  (MOBIC ) 15 MG tablet TAKE 1 TABLET (15 MG TOTAL) BY MOUTH DAILY.   Multiple Vitamins-Minerals (MULTIVITAMIN ADULT PO) Take 1 tablet by mouth daily.   simvastatin  (ZOCOR ) 40 MG tablet Take 1 tablet (40 mg total) by mouth daily at 6 PM.   colesevelam  (WELCHOL ) 625 MG tablet TAKE 1 TABLET (625 MG TOTAL) BY MOUTH 3 (THREE) TIMES DAILY AS NEEDED. (Patient not taking: Reported on 07/24/2023)   dicyclomine  (BENTYL ) 10 MG capsule TAKE 1 CAPSULE (10 MG TOTAL) BY MOUTH 3 (THREE) TIMES DAILY AS NEEDED FOR SPASMS. (Patient not taking: Reported on 07/24/2023)   sodium zirconium cyclosilicate  (LOKELMA ) 10 g PACK packet Take 10 g this am and this pm. (Patient not taking: Reported on 07/24/2023)   [DISCONTINUED] LINZESS  145 MCG CAPS capsule TAKE 1 CAPSULE BY MOUTH DAILY BEFORE BREAKFAST. (Patient not taking: Reported on 07/24/2023)   Facility-Administered Encounter Medications as of 07/24/2023  Medication   0.9 %  sodium chloride  infusion    Allergies as of 07/24/2023   (No Known Allergies)    Past Medical History:  Diagnosis Date   Abnormal echocardiogram 05/08/2019   Anemia    hx of, not at this time   ANXIETY DISORDER- mixed disorder w/ depressive symptoms 02/07/2014   Aortic regurgitation    Arthritis    right hip; left hip; most of my joints (05/27/2014)   Atherosclerosis of aorta (HCC) 03/08/2021   Blood transfusion without reported diagnosis    [facility]rees[facility] 09/04/2020   Cancer (HCC)    [facility] 09/04/2020   COLONIC POLYPS, ADENOMATOUS 02/20/2003   Qualifier: Diagnosis of   By: Myriam RN, Carissa         Depression    Diverticulosis    DIVERTICULOSIS, COLON 02/20/2003   Qualifier: Diagnosis of   By: Myriam RN, Carissa      Replacing diagnoses that were inactivated after the 10/10/22 regulatory import     Dyslipidemia 03/08/2021   Elevated cholesterol 02/14/2018   Enlarged thoracic aorta (HCC) 04/17/2019   Essential  hypertension 10/13/2007   Qualifier: Diagnosis of   By: Myriam RN, Carissa         Essential hypertension, benign    takes Lisinopril -HCTZ daily   Heart murmur    HERNIA, UMBILICAL 10/13/2007   Qualifier: Diagnosis of   By: Myriam RN, Carissa      Replacing diagnoses that were inactivated after the 10/10/22 regulatory import     History of blood transfusion    no abnormal reaction noted 2015   History of colonic polyps 05/21/2021   History of malignant neoplasm of large intestine 02/20/2003   Qualifier: Diagnosis of   By: Myriam RN, Carissa         Hyperlipidemia    [facility]reesia 09/04/2020   Hypertension    [facility] 09/04/2020   Insomnia    doesn't take any meds   Monoallelic mutation of SDHA gene 06/25/2021   Neuromuscular disorder (HCC)    Neuropathy    since chemo and radiation   Nocturia    Paresthesia of upper extremity 04/03/2023   PERIPHERAL NEUROPATHY 10/13/2007   Qualifier: Diagnosis of   By: Myriam RN, Carissa      Replacing diagnoses that were inactivated  after the 10/10/22 regulatory import     Prediabetes 03/06/2020   Rectosigmoid cancer (HCC) 2004   S/P Surgery, Chemo, Rad Tx,  (after chemo treatment menstral periods have ceased).   Status post bilateral total hip replacement 05/27/2014   Steatosis of liver 03/08/2021    Past Surgical History:  Procedure Laterality Date   ANAL RECTAL MANOMETRY N/A 07/17/2017   Procedure: ANO RECTAL MANOMETRY;  Surgeon: Shila Gustav GAILS, MD;  Location: [facility];  Service: Endoscopy;  Laterality: N/A;   COLECTOMY  Aug 2004   S/P Low Anterior Resxn (Sigmoid Colon)   COLON SURGERY N/A    [facility] 09/04/2020   COLONOSCOPY     DILATION AND CURETTAGE OF UTERUS  1991   JOINT REPLACEMENT N/A    [facility] 09/04/2020   LUMBAR DISC SURGERY  Feb 2007   SPINE SURGERY N/A    [facility] 09/04/2020   TOTAL HIP ARTHROPLASTY Right 05/27/2014   Procedure: RIGHT TOTAL HIP ARTHROPLASTY ANTERIOR APPROACH;  Surgeon: Maude KANDICE Herald, MD;   Location: [facility];  Service: Orthopedics;  Laterality: Right;   TOTAL HIP ARTHROPLASTY Left 08/25/2015   Procedure: TOTAL HIP ARTHROPLASTY ANTERIOR APPROACH;  Surgeon: Maude Herald, MD;  Location: [facility];  Service: Orthopedics;  Laterality: Left;    Family History  Adopted: Yes  Problem Relation Age of Onset   Colitis Neg Hx    Colon cancer Neg Hx    Colon polyps Neg Hx    Esophageal cancer Neg Hx    Rectal cancer Neg Hx    Stomach cancer Neg Hx     Social History   Socioeconomic History   Marital status: Married    Spouse name: Koren   Number of children: 1   Years of education: Not on file   Highest education level: Master's degree (e.g., MA, MS, MEng, MEd, MSW, MBA)  Occupational History   Occupation: office work  Tobacco Use   Smoking status: Former    Types: Cigarettes    Passive exposure: Past   Smokeless tobacco: Never   Tobacco comments:    smoked some socially in my college days  Vaping Use   Vaping status: Never Used  Substance and Sexual Activity   Alcohol use: Yes    Comment: occasionally beer/wine   Drug use: No   Sexual activity: Yes    Partners: Male    Birth control/protection: None  Other Topics Concern   Not on file  Social History Narrative   Lives with husband   Exercise walking 2 x weekly   Patient's family history -  UNKNOWN ADOPTED   Social Drivers of Health   Financial Resource Strain: Low Risk  (03/31/2023)   Overall Financial Resource Strain (CARDIA)    Difficulty of Paying Living Expenses: Not hard at all  Food Insecurity: No Food Insecurity (03/31/2023)   Hunger Vital Sign    Worried About Running Out of Food in the Last Year: Never true    Ran Out of Food in the Last Year: Never true  Transportation Needs: No Transportation Needs (03/31/2023)   PRAPARE - Administrator, Civil Service (Medical): No    Lack of Transportation (Non-Medical): No  Physical Activity: Insufficiently Active (03/31/2023)   Exercise Vital Sign     Days of Exercise per Week: 3 days    Minutes of Exercise per Session: 30 min  Stress: No Stress Concern Present (03/31/2023)   [employer] of Occupational Health - Occupational Stress Questionnaire    Feeling  of Stress : Only a little  Social Connections: Moderately Integrated (03/31/2023)   Social Connection and Isolation Panel [NHANES]    Frequency of Communication with Friends and Family: More than three times a week    Frequency of Social Gatherings with Friends and Family: Once a week    Attends Religious Services: Never    Database Administrator or Organizations: Yes    Attends Engineer, Structural: More than 4 times per year    Marital Status: Married  Catering Manager Violence: Not on file      Review of systems: All other review of systems negative except as mentioned in the HPI.   Physical Exam: Vitals:   07/24/23 0828  BP: 122/68  Pulse: 76   Body mass index is 27.46 kg/m. Gen:      No acute distress HEENT:  sclera anicteric CV: s1s2 rrr, no murmur Lungs: B/l clear. Abd:      soft, non-tender; no palpable masses, no distension Ext:    No edema Neuro: alert and oriented x 3 Psych: normal mood and affect  Data Reviewed:  Reviewed labs, radiology imaging, old records and pertinent past GI work up     Assessment and Plan    68 year old very pleasant female with history of colon cancer s/p resection with IBS, alternating diarrhea and constipation secondary to outlet dysfunction and dyssynergic defecation   Chronic Constipation Daily use of Linzess  with intermittent use of Imodium leading to a cycle of diarrhea and constipation. Patient also reports abdominal pain and bloating. -Continue Linzess  290 mcg daily. -Reduce use of Imodium, only use sparingly when necessary. -Increase Benefiber intake to three times daily to help bulk stool. -Consider use of Miralax on days when Imodium is taken to prevent constipation.  Abdominal Hernia Patient  reports presence of hernia but no associated pain or complications. -No intervention needed at this time, monitor for changes.  Colon Cancer Surveillance Last colonoscopy in November 2023 with no findings. Patient has a history of colon cancer. -Plan for next colonoscopy in 2026, patient prefers to have it done earlier in 2025.  Pelvic Floor Dysfunction Patient has had previous physical therapy. -Consider refresher physical therapy sessions if it has been over a year since last session.       The patient was provided an opportunity to ask questions and all were answered. The patient agreed with the plan and demonstrated an understanding of the instructions.  LOIS Wilkie Mcgee , MD    CC: Purcell Emil Schanz, [place]

## 2023-07-25 ENCOUNTER — Encounter: Payer: Self-pay | Admitting: Gastroenterology

## 2023-07-26 ENCOUNTER — Encounter: Payer: Self-pay | Admitting: Gastroenterology

## 2023-07-27 DIAGNOSIS — Z1231 Encounter for screening mammogram for malignant neoplasm of breast: Secondary | ICD-10-CM | POA: Diagnosis not present

## 2023-07-27 LAB — HM MAMMOGRAPHY

## 2023-07-28 ENCOUNTER — Encounter: Payer: Self-pay | Admitting: Emergency Medicine

## 2023-07-28 DIAGNOSIS — M533 Sacrococcygeal disorders, not elsewhere classified: Secondary | ICD-10-CM | POA: Diagnosis not present

## 2023-07-28 DIAGNOSIS — M7061 Trochanteric bursitis, right hip: Secondary | ICD-10-CM | POA: Diagnosis not present

## 2023-07-28 DIAGNOSIS — M48062 Spinal stenosis, lumbar region with neurogenic claudication: Secondary | ICD-10-CM | POA: Diagnosis not present

## 2023-08-15 ENCOUNTER — Encounter: Payer: Self-pay | Admitting: Gastroenterology

## 2023-08-16 DIAGNOSIS — M533 Sacrococcygeal disorders, not elsewhere classified: Secondary | ICD-10-CM | POA: Diagnosis not present

## 2023-08-21 ENCOUNTER — Other Ambulatory Visit: Payer: Self-pay

## 2023-08-21 ENCOUNTER — Telehealth: Payer: Self-pay

## 2023-08-21 MED ORDER — LUBIPROSTONE 24 MCG PO CAPS: 24.0000 ug | ORAL_CAPSULE | Freq: Two times a day (BID) | ORAL | 3 refills | Status: DC

## 2023-08-21 NOTE — Telephone Encounter (Signed)
 Patient notified

## 2023-08-21 NOTE — Telephone Encounter (Signed)
 The patient's cost for Linzess  has increased and she is interested in alternatives. I did not find where she has been on any other medication except Linzess . Please advise.

## 2023-08-21 NOTE — Telephone Encounter (Signed)
 Please check if Amitiza  is covered by insurance, please Rx for 24mcg BID X 30 days with 3 refills. Thanks

## 2023-08-22 ENCOUNTER — Encounter: Payer: Self-pay | Admitting: Neurology

## 2023-08-23 ENCOUNTER — Encounter: Payer: Self-pay | Admitting: Emergency Medicine

## 2023-08-23 ENCOUNTER — Other Ambulatory Visit: Payer: Self-pay | Admitting: Emergency Medicine

## 2023-08-23 MED ORDER — GABAPENTIN 400 MG PO CAPS: 400.0000 mg | ORAL_CAPSULE | Freq: Every day | ORAL | 0 refills | Status: DC

## 2023-08-23 NOTE — Telephone Encounter (Signed)
New prescription for gabapentin sent to pharmacy of record today.  Thanks.

## 2023-09-15 DIAGNOSIS — M533 Sacrococcygeal disorders, not elsewhere classified: Secondary | ICD-10-CM | POA: Diagnosis not present

## 2023-09-15 DIAGNOSIS — M7061 Trochanteric bursitis, right hip: Secondary | ICD-10-CM | POA: Diagnosis not present

## 2023-10-04 ENCOUNTER — Encounter: Payer: Self-pay | Admitting: Neurology

## 2023-10-04 ENCOUNTER — Telehealth: Payer: Self-pay | Admitting: *Deleted

## 2023-10-04 ENCOUNTER — Ambulatory Visit: Payer: 59 | Admitting: Neurology

## 2023-10-04 VITALS — BP 143/70 | HR 72 | Ht 63.0 in | Wt 155.8 lb

## 2023-10-04 DIAGNOSIS — R519 Headache, unspecified: Secondary | ICD-10-CM

## 2023-10-04 DIAGNOSIS — M542 Cervicalgia: Secondary | ICD-10-CM | POA: Insufficient documentation

## 2023-10-04 DIAGNOSIS — M7918 Myalgia, other site: Secondary | ICD-10-CM | POA: Insufficient documentation

## 2023-10-04 DIAGNOSIS — M5030 Other cervical disc degeneration, unspecified cervical region: Secondary | ICD-10-CM | POA: Insufficient documentation

## 2023-10-04 DIAGNOSIS — M4802 Spinal stenosis, cervical region: Secondary | ICD-10-CM | POA: Insufficient documentation

## 2023-10-04 DIAGNOSIS — G4734 Idiopathic sleep related nonobstructive alveolar hypoventilation: Secondary | ICD-10-CM

## 2023-10-04 MED ORDER — GABAPENTIN (ONCE-DAILY) 600 MG PO TABS: 600.0000 mg | ORAL_TABLET | Freq: Every evening | ORAL | 4 refills | Status: DC

## 2023-10-04 NOTE — Telephone Encounter (Signed)
-----   Message from Anson Fret sent at 10/04/2023 11:04 AM EDT ----- Regarding: ONO ordered ONO ordered can you ensure it prints out and gets sent? Thank you !

## 2023-10-04 NOTE — Telephone Encounter (Signed)
 Zott, Linnell Fulling, Otilio Jefferson, RN; [place], [place] Got it Thank you

## 2023-10-04 NOTE — Progress Notes (Signed)
 [facility]    Provider:  Dr Lucia Gaskins Requesting Provider: Georgina Quint, * Primary Care Provider:  Georgina Quint, MD  CC:  cervicalgia  HPI:  [name] [name] is a 68 y.o. female here as requested by Georgina Quint, * for migraines. She has [facility](she summary of notes below). has COLONIC POLYPS, ADENOMATOUS; PERIPHERAL NEUROPATHY; Essential hypertension; HERNIA, UMBILICAL; DIVERTICULOSIS, COLON; History of malignant neoplasm of large intestine; ANXIETY DISORDER- mixed disorder w/ depressive symptoms; Status post bilateral total hip replacement; Elevated cholesterol; Enlarged thoracic aorta (HCC); Abnormal echocardiogram; Prediabetes; Atherosclerosis of aorta (HCC); Steatosis of liver; Dyslipidemia; History of colonic polyps; Monoallelic mutation of SDHA gene; Rectosigmoid cancer (HCC); Nocturia; Neuropathy; Insomnia; History of blood transfusion; Heart murmur; Diverticulosis; Depression; Cancer (HCC); Arthritis; Aortic regurgitation; Anemia; Paresthesia of upper extremity; Blood transfusion without reported diagnosis; Essential hypertension, benign; Hypertension; Hyperlipidemia; Neuromuscular disorder (HCC); Hyperkalemia; Cervicalgia; Morning headache; Myofascial pain syndrome, cervical; Cervical stenosis of spinal canal; and DDD (degenerative disc disease), cervical on their problem list.  Headaches ongoing a "long time" unclear how many years.  She had chemotherapy in thepast and it has affected her joints. She has a lot of neck pain and degenerative change son MRI cervical spine and saw a surgeon about it at [facility] and she believes she has cervicalgia from her neck. She has a mild headache and she has neck pain. She has pain in the shoulders and muscles of the neck. She was placed on gabapentin and she takes that at night and it seems to help. She doesn't have a headache now. The Headaches are mild.  Just some frontal mild headache but no throbbing, no nausea, no vimitng, no light or sound sensitivity and would happen upon waking. Husband says she doesn't snore, she is very tired during the day, with the gabapentin she has less morning headaches, on occasion she will have a headache in the morning but the gabapentin helps a lot and she can see a difference. Dr. Neale Burly adjusted the medication. She feels tired during the day, she doesn't sleep well, she wakes often and doesn't feel refreshed in the morning and pees about 3 times. She will wake up with a headache even before getting out of bed every other day but more mild. Her neck doesn't bother her at all no weakness, new numbness or tingling, no vision changes, not positional or exertional. No other focal neurologic deficits, associated symptoms, inciting events or modifiable factors.  Reviewed notes, labs and imaging from outside physicians, which showed:  Occasions tried that can be used in migraine/headache management include: Tylenol, amitriptyline, aspirin and other various over-the-counter analgesics, Celexa, Flexeril, Decadron injections, Voltaren tablets, Benadryl, Cymbalta, gabapentin, lisinopril, meloxicam, Robaxin, Reglan, Zofran, Lyrica, Phenergan, oxycodone, Percocet, Ultram, Vicodin, Vicoprofen, zonisamide and topiramate.  Cannot take beta-blockers due to already being on blood pressure medications combination would cause hypotension.  I reviewed Dr. Onnie Boer notes last seen July 2024, he described a mild headache, in the frontal bilateral vertex radiating to the occipital bilateral headache continuously described as dull and achy increasing with movement, dizziness, no nausea vomiting photophobia phonophobia or osmophobia.  Pain is aggravated by no factors, autonomic's are absent, prodrome is absent, and aura is not present, triggers include not enough sleep diagnosed with chronic tension type headache.  A very thorough neurologic exam  was normal.  Also diagnosed with cervicalgia and myalgia and chronic facial pain headache and facial neuralgia.  His last plan was  Biofreeze and gabapentin 400 with an increase to 600.  Possible overuse with meloxicam.  Also has a history of peripheral neuropathy.  08/06/2019: CLINICAL DATA:  Headache and neck pain.  History colon cancer   EXAM: personall reviewed imges (and with ptient) and agree with report MRI CERVICAL SPINE WITHOUT CONTRAST   TECHNIQUE: Multiplanar, multisequence MR imaging of the cervical spine was performed. No intravenous contrast was administered.   COMPARISON:  None.   FINDINGS: Alignment: Mild anterolisthesis C3-4. Mild retrolisthesis C4-5, C5-6, C6-7. Straightening of the cervical lordosis.   Vertebrae: Negative for fracture or mass. No bone marrow metastatic disease identified.   Cord: Normal spinal cord signal.   Posterior Fossa, vertebral arteries, paraspinal tissues: Negative   Disc levels:   C2-3: Negative   C3-4: Mild anterolisthesis. Disc degeneration and uncinate spurring as well as bilateral facet degeneration. Moderate foraminal stenosis bilaterally. No cord deformity   C4-5: Moderate disc degeneration and spondylosis. Diffuse uncinate spurring is present right greater than left. Large right-sided osteophyte with severe right foraminal encroachment and cord flattening on the right. Mild spinal stenosis. Moderate left foraminal stenosis due to spurring.   C5-6: Advanced disc degeneration and spondylosis. Prominent uncinate spurring diffusely and bilaterally causing moderate spinal stenosis and severe foraminal encroachment bilaterally. There is cord flattening without cord signal abnormality.   C6-7: Disc degeneration and spondylosis. Diffuse uncinate spurring. Moderate spinal stenosis and moderate foraminal stenosis bilaterally.   C7-T1: Negative   IMPRESSION: Advanced spondylosis at multiple levels.   Moderate spinal stenosis  and moderate foraminal stenosis bilaterally at C5-6 and C6-7.   Large right-sided osteophyte at C4-5 with severe right foraminal encroachment and mild spinal stenosis.  Review of Systems: Patient complains of symptoms per HPI as well as the following symptoms none. Pertinent negatives and positives per HPI. All others negative.   Social History   Socioeconomic History   Marital status: Married    Spouse name: Alinda Money   Number of children: 1   Years of education: Not on file   Highest education level: Master's degree (e.g., MA, MS, MEng, MEd, MSW, MBA)  Occupational History   Occupation: office work  Tobacco Use   Smoking status: Former    Types: Cigarettes    Passive exposure: Past   Smokeless tobacco: Never   Tobacco comments:    "smoked some socially in my college days"  Vaping Use   Vaping status: Never Used  Substance and Sexual Activity   Alcohol use: Yes    Comment: occasionally beer/wine   Drug use: No   Sexual activity: Yes    Partners: Male    Birth control/protection: None  Other Topics Concern   Not on file  Social History Narrative   Lives with husband   Exercise walking 2 x weekly   Patient's family history -  UNKNOWN ADOPTED   Social Drivers of Health   Financial Resource Strain: Low Risk  (03/31/2023)   Overall Financial Resource Strain (CARDIA)    Difficulty of Paying Living Expenses: Not hard at all  Food Insecurity: No Food Insecurity (03/31/2023)   Hunger Vital Sign    Worried About Running Out of Food in the Last Year: Never true    Ran Out of Food in the Last Year: Never true  Transportation Needs: No Transportation Needs (03/31/2023)   PRAPARE - Administrator, Civil Service (Medical): No    Lack of Transportation (Non-Medical): No  Physical Activity: Insufficiently Active (03/31/2023)   Exercise Vital Sign  Days of Exercise per Week: 3 days    Minutes of Exercise per Session: 30 min  Stress: No Stress Concern Present (03/31/2023)    [employer] of Occupational Health - Occupational Stress Questionnaire    Feeling of Stress : Only a little  Social Connections: Moderately Integrated (03/31/2023)   Social Connection and Isolation Panel [NHANES]    Frequency of Communication with Friends and Family: More than three times a week    Frequency of Social Gatherings with Friends and Family: Once a week    Attends Religious Services: Never    Database administrator or Organizations: Yes    Attends Engineer, structural: More than 4 times per year    Marital Status: Married  Catering manager Violence: Not on file    Family History  Adopted: Yes  Problem Relation Age of Onset   Colitis Neg Hx    Colon cancer Neg Hx    Colon polyps Neg Hx    Esophageal cancer Neg Hx    Rectal cancer Neg Hx    Stomach cancer Neg Hx     Past Medical History:  Diagnosis Date   Abnormal echocardiogram 05/08/2019   Anemia    hx of, not at this time   ANXIETY DISORDER- mixed disorder w/ depressive symptoms 02/07/2014   Aortic regurgitation    Arthritis    "right hip; left hip; most of my joints" (05/27/2014)   Atherosclerosis of aorta (HCC) 03/08/2021   Blood transfusion without reported diagnosis    Phreesia 09/04/2020   Cancer (HCC)    Phreesia 09/04/2020   COLONIC POLYPS, ADENOMATOUS 02/20/2003   Qualifier: Diagnosis of   By: Gwinda Passe RN, Carissa         Depression    Diverticulosis    DIVERTICULOSIS, COLON 02/20/2003   Qualifier: Diagnosis of   By: Gwinda Passe RN, Carissa      Replacing diagnoses that were inactivated after the 10/10/22 regulatory import     Dyslipidemia 03/08/2021   Elevated cholesterol 02/14/2018   Enlarged thoracic aorta (HCC) 04/17/2019   Essential hypertension 10/13/2007   Qualifier: Diagnosis of   By: Gwinda Passe RN, Carissa         Essential hypertension, benign    takes Lisinopril-HCTZ daily   Heart murmur    HERNIA, UMBILICAL 10/13/2007   Qualifier: Diagnosis of   By: Gwinda Passe RN, Carissa       Replacing diagnoses that were inactivated after the 10/10/22 regulatory import     History of blood transfusion    no abnormal reaction noted 2015   History of colonic polyps 05/21/2021   History of malignant neoplasm of large intestine 02/20/2003   Qualifier: Diagnosis of   By: Gwinda Passe RN, Carissa         Hyperlipidemia    Phreesia 09/04/2020   Hypertension    Phreesia 09/04/2020   Insomnia    doesn't take any meds   Monoallelic mutation of SDHA gene 06/25/2021   Neuromuscular disorder (HCC)    Neuropathy    since chemo and radiation   Nocturia    Paresthesia of upper extremity 04/03/2023   PERIPHERAL NEUROPATHY 10/13/2007   Qualifier: Diagnosis of   By: Gwinda Passe RN, Carissa      Replacing diagnoses that were inactivated after the 10/10/22 regulatory import     Prediabetes 03/06/2020   Rectosigmoid cancer (HCC) 2004   S/P Surgery, Chemo, Rad Tx,  (after chemo treatment menstral periods have ceased).   Status post bilateral total hip replacement  05/27/2014   Steatosis of liver 03/08/2021    Patient Active Problem List   Diagnosis Date Noted   Cervicalgia 10/04/2023   Morning headache 10/04/2023   Myofascial pain syndrome, cervical 10/04/2023   Cervical stenosis of spinal canal 10/04/2023   DDD (degenerative disc disease), cervical 10/04/2023   Hyperkalemia 06/12/2023   Blood transfusion without reported diagnosis    Essential hypertension, benign    Hypertension    Hyperlipidemia    Neuromuscular disorder (HCC)    Paresthesia of upper extremity 04/03/2023   Nocturia 10/25/2021   Neuropathy 10/25/2021   Insomnia 10/25/2021   History of blood transfusion 10/25/2021   Heart murmur 10/25/2021   Diverticulosis 10/25/2021   Depression 10/25/2021   Cancer (HCC) 10/25/2021   Arthritis 10/25/2021   Aortic regurgitation 10/25/2021   Anemia 10/25/2021   Monoallelic mutation of SDHA gene 06/25/2021   History of colonic polyps 05/21/2021   Atherosclerosis of aorta (HCC) 03/08/2021    Steatosis of liver 03/08/2021   Dyslipidemia 03/08/2021   Prediabetes 03/06/2020   Abnormal echocardiogram 05/08/2019   Enlarged thoracic aorta (HCC) 04/17/2019   Elevated cholesterol 02/14/2018   Status post bilateral total hip replacement 05/27/2014   ANXIETY DISORDER- mixed disorder w/ depressive symptoms 02/07/2014   PERIPHERAL NEUROPATHY 10/13/2007   Essential hypertension 10/13/2007   HERNIA, UMBILICAL 10/13/2007   COLONIC POLYPS, ADENOMATOUS 02/20/2003   DIVERTICULOSIS, COLON 02/20/2003   History of malignant neoplasm of large intestine 02/20/2003   Rectosigmoid cancer (HCC) 2004    Past Surgical History:  Procedure Laterality Date   ANAL RECTAL MANOMETRY N/A 07/17/2017   Procedure: ANO RECTAL MANOMETRY;  Surgeon: Napoleon Form, MD;  Location: [facility];  Service: Endoscopy;  Laterality: N/A;   COLECTOMY  Aug 2004   S/P Low Anterior Resxn (Sigmoid Colon)   COLON SURGERY N/A    Phreesia 09/04/2020   COLONOSCOPY     DILATION AND CURETTAGE OF UTERUS  1991   JOINT REPLACEMENT N/A    Phreesia 09/04/2020   LUMBAR DISC SURGERY  Feb 2007   SPINE SURGERY N/A    Phreesia 09/04/2020   TOTAL HIP ARTHROPLASTY Right 05/27/2014   Procedure: RIGHT TOTAL HIP ARTHROPLASTY ANTERIOR APPROACH;  Surgeon: Velna Ochs, MD;  Location: [facility];  Service: Orthopedics;  Laterality: Right;   TOTAL HIP ARTHROPLASTY Left 08/25/2015   Procedure: TOTAL HIP ARTHROPLASTY ANTERIOR APPROACH;  Surgeon: Marcene Corning, MD;  Location: [facility];  Service: Orthopedics;  Laterality: Left;    Current Outpatient Medications  Medication Sig Dispense Refill   Artificial Tear Ointment (DRY EYES OP) Apply 1 drop to eye as needed (dry eyes).     finasteride (PROPECIA) 1 MG tablet Take 1 mg by mouth daily.     Gabapentin, Once-Daily, 600 MG TABS Take 1 tablet (600 mg total) by mouth at bedtime. 90 tablet 4   lisinopril (ZESTRIL) 40 MG tablet TAKE 1 TABLET BY MOUTH EVERY DAY 90 tablet 3   meloxicam (MOBIC)  15 MG tablet TAKE 1 TABLET (15 MG TOTAL) BY MOUTH DAILY. 90 tablet 3   simvastatin (ZOCOR) 40 MG tablet Take 1 tablet (40 mg total) by mouth daily at 6 PM. 90 tablet 3   Current Facility-Administered Medications  Medication Dose Route Frequency Provider Last Rate Last Admin   0.9 %  sodium chloride infusion  500 mL Intravenous Once Napoleon Form, MD        Allergies as of 10/04/2023   (No Known Allergies)    Vitals: BP [name])  143/70 (BP Location: Right Arm, Patient Position: Sitting, Cuff Size: Normal)   Pulse 72   Ht 5\' 3"  (1.6 m)   Wt 155 lb 12.8 oz (70.7 kg)   BMI 27.60 kg/m  Last Weight:  Wt Readings from Last 1 Encounters:  10/04/23 155 lb 12.8 oz (70.7 kg)   Last Height:   Ht Readings from Last 1 Encounters:  10/04/23 5\' 3"  (1.6 m)     Physical exam: Exam: Gen: NAD, conversant, well nourised, well groomed                     CV: RRR, no MRG. No Carotid Bruits. No peripheral edema, warm, nontender Eyes: Conjunctivae clear without exudates or hemorrhage  Neuro: Detailed Neurologic Exam  Speech:    Speech is normal; fluent and spontaneous with normal comprehension.  Cognition:    The patient is oriented to person, place, and time;     recent and remote memory intact;     language fluent;     normal attention, concentration,     fund of knowledge Cranial Nerves:    The pupils are equal, round, and reactive to light. Pupils too small to visualize fundi. Visual fields are full to finger confrontation. Extraocular movements are intact. Trigeminal sensation is intact and the muscles of mastication are normal. The face is symmetric. The palate elevates in the midline. Hearing intact. Voice is normal. Shoulder shrug is normal. The tongue has normal motion without fasciculations.   Coordination:    Normal finger to nose and heel to shin. Normal rapid alternating movements.   Gait: External rotation of legs otherwise unremarkable  Motor Observation:    No  asymmetry, no atrophy, and no involuntary movements noted. Tone:    Normal muscle tone.    Posture:    Posture is normal. normal erect    Strength:    Strength is symmetrical in the upper and lower limbs, no focal deficits on strength exam     Sensation: intact to LT     Reflex Exam:  DTR's:    Deep tendon reflexes in the upper and lower extremities are symmetrical bilaterally.   Toes:    The toes are equiv bilaterally.   Clonus:    Clonus is absent.    Assessment/Plan:  Patient with cervicalgia, morning headaches, doing well on gabapentin   Cervicalgia: gabapentin doing well. Can continue Gabapentin 600mg . The 400mg  has not been working as well will increase back to 600mg  and Dr. Alvy Bimler can refill in the future, her kidney function is normal(06/2023 BUN 16, creat 0.83)  Morning headaches: Overnight oxygen monitor and if concern for hypoxemia overnight can send for sleep evaluation Will hold of on MRI brain since improved and nothing concerning right now Cervical myofascial pain: She would dry needling at [facility] will refer.  Cervical stenosis: continue to follow with orthopaedics  Meds ordered this encounter  Medications   Gabapentin, Once-Daily, 600 MG TABS    Sig: Take 1 tablet (600 mg total) by mouth at bedtime.    Dispense:  90 tablet    Refill:  4   Orders Placed This Encounter  Procedures   Ambulatory referral to Physical Therapy   Overnight Pulse Oximetry Study    Cc: Sagardia, [place], *,  Sagardia, [place], [place]  Naomie Dean, MD  [facility] [address] [place], [place] [address]  Phone [phone] Fax [phone]

## 2023-10-04 NOTE — Patient Instructions (Addendum)
 Cervicalgia: gabapentin doing well. Can continue Gabapentin 600mg . The 400mg  has not been working as well will increase back to 600mg  and Dr. Alvy Bimler can refill in the future, her kidney function is normal(06/2023 BUN 16, creat 0.83)  Morning headaches: Overnight oxygen monitor and if concern for hypoxemia overnight can send for sleep evaluation Will hold of on MRI brain since improved and nothing concerning right now Cervical myofascial pain: She would dry needling at [facility] will refer.    Sleep Apnea  Sleep apnea is a condition that affects your breathing while you are sleeping. Your tongue or soft tissue in your throat may block the flow of air while you sleep. You may have shallow breathing or stop breathing for short periods of time. People with sleep apnea may snore loudly. There are three kinds of sleep apnea: Obstructive sleep apnea. This kind is caused by a blocked or collapsed airway. This is the most common. Central sleep apnea. This kind happens when the part of the brain that controls breathing does not send the correct signals to the muscles that control breathing. Mixed sleep apnea. This is a combination of obstructive and central sleep apnea. What are the causes? The most common cause of sleep apnea is a collapsed or blocked airway. What increases the risk? Being very overweight. Having family members with sleep apnea. Having a tongue or tonsils that are larger than normal. Having a small airway or jaw problems. Being older. What are the signs or symptoms? Loud snoring. Restless sleep. Trouble staying asleep. Being sleepy or tired during the day. Waking up gasping or choking. Having a headache in the morning. Mood swings. Having a hard time remembering things and concentrating. How is this diagnosed? A medical history. A physical exam. A sleep study. This is also called a polysomnography test. This test is done at a sleep lab or in your home while you are  sleeping. How is this treated? Treatment may include: Sleeping on your side. Losing weight if you're overweight. Wearing an oral appliance. This is a mouthpiece that moves your lower jaw forward. Using a positive airway pressure (PAP) device to keep your airways open while you sleep, such as: A continuous positive airway pressure (CPAP) device. This device gives forced air through a mask when you breathe out. This keeps your airways open. A bilevel positive airway pressure (BIPAP) device. This device gives forced air through a mask when you breathe in and when you breathe out to keep your airways open. Having surgery if other treatments do not work. If your sleep apnea is not treated, you may be at risk for: Heart failure. Heart attack. Stroke. Type 2 diabetes or a problem with your blood sugar called insulin resistance. Follow these instructions at home: Medicines Take your medicines only as told by your health care provider. Avoid alcohol, medicines to help you relax, and certain pain medicines. These may make sleep apnea worse. General instructions Do not smoke, vape, or use products with nicotine or tobacco in them. If you need help quitting, talk with your provider. If you were given a PAP device to open your airway while you sleep, use it as told by your provider. If you're having surgery, make sure to tell your provider you have sleep apnea. You may need to bring your PAP device with you. Contact a health care provider if: The PAP device that you were given to use during sleep bothers you or does not seem to be working. You do not feel  better or you feel worse. Get help right away if: You have trouble breathing. You have chest pain. You have trouble talking. One side of your body feels weak. A part of your face is hanging down. These symptoms may be an emergency. Call 911 right away. Do not wait to see if the symptoms will go away. Do not drive yourself to the hospital. This  information is not intended to replace advice given to you by your health care provider. Make sure you discuss any questions you have with your health care provider. Document Revised: 03/30/2023 Document Reviewed: 09/01/2022 Elsevier Patient Education  2024 Else[employer].Gabapentin Capsules or Tablets What is this medication? GABAPENTIN (GA ba pen tin) treats nerve pain. It may also be used to prevent and control seizures in people with epilepsy. It works by calming overactive nerves in your body. This medicine may be used for other purposes; ask your health care provider or pharmacist if you have questions. COMMON BRAND NAME(S): Active-PAC with Gabapentin, Ascencion Dike, Gralise, Neurontin What should I tell my care team before I take this medication? They need to know if you have any of these conditions: Kidney disease Lung or breathing disease Substance use disorder Suicidal thoughts, plans, or attempt by you or a family member An unusual or allergic reaction to gabapentin, other medications, foods, dyes, or preservatives Pregnant or trying to get pregnant Breastfeeding How should I use this medication? Take this medication by mouth with a glass of water. Follow the directions on the prescription label. You can take it with or without food. If it upsets your stomach, take it with food. Take your medication at regular intervals. Do not take it more often than directed. Do not stop taking except on your care team's advice. If you are directed to break the 600 or 800 mg tablets in half as part of your dose, the extra half tablet should be used for the next dose. If you have not used the extra half tablet within 28 days, it should be thrown away. A special MedGuide will be given to you by the pharmacist with each prescription and refill. Be sure to read this information carefully each time. Talk to your care team about the use of this medication in children. While this medication may be prescribed for  children as young as 3 years for selected conditions, precautions do apply. Overdosage: If you think you have taken too much of this medicine contact a poison control center or emergency room at once. NOTE: This medicine is only for you. Do not share this medicine with others. What if I miss a dose? If you miss a dose, take it as soon as you can. If it is almost time for your next dose, take only that dose. Do not take double or extra doses. What may interact with this medication? Alcohol Antihistamines for allergy, cough, and cold Certain medications for anxiety or sleep Certain medications for depression like amitriptyline, fluoxetine, sertraline Certain medications for seizures like phenobarbital, primidone Certain medications for stomach problems General anesthetics like halothane, isoflurane, methoxyflurane, propofol Local anesthetics like lidocaine, pramoxine, tetracaine Medications that relax muscles for surgery Opioid medications for pain Phenothiazines like chlorpromazine, mesoridazine, prochlorperazine, thioridazine This list may not describe all possible interactions. Give your health care provider a list of all the medicines, herbs, non-prescription drugs, or dietary supplements you use. Also tell them if you smoke, drink alcohol, or use illegal drugs. Some items may interact with your medicine. What should I watch for while  using this medication? Visit your care team for regular checks on your progress. You may want to keep a record at home of how you feel your condition is responding to treatment. You may want to share this information with your care team at each visit. You should contact your care team if your seizures get worse or if you have any new types of seizures. Do not stop taking this medication or any of your seizure medications unless instructed by your care team. Stopping your medication suddenly can increase your seizures or their severity. This medication may cause  serious skin reactions. They can happen weeks to months after starting the medication. Contact your care team right away if you notice fevers or flu-like symptoms with a rash. The rash may be red or purple and then turn into blisters or peeling of the skin. Or, you might notice a red rash with swelling of the face, lips or lymph nodes in your neck or under your arms. Wear a medical identification bracelet or chain if you are taking this medication for seizures. Carry a card that lists all your medications. This medication may affect your coordination, reaction time, or judgment. Do not drive or operate machinery until you know how this medication affects you. Sit up or stand slowly to reduce the risk of dizzy or fainting spells. Drinking alcohol with this medication can increase the risk of these side effects. Your mouth may get dry. Chewing sugarless gum or sucking hard candy, and drinking plenty of water may help. Watch for new or worsening thoughts of suicide or depression. This includes sudden changes in mood, behaviors, or thoughts. These changes can happen at any time but are more common in the beginning of treatment or after a change in dose. Call your care team right away if you experience these thoughts or worsening depression. If you become pregnant while using this medication, you may enroll in the [place] American Antiepileptic Drug Pregnancy Registry by calling [phone]. This registry collects information about the safety of antiepileptic medication use during pregnancy. What side effects may I notice from receiving this medication? Side effects that you should report to your care team as soon as possible: Allergic reactions or angioedema--skin rash, itching, hives, swelling of the face, eyes, lips, tongue, arms, or legs, trouble swallowing or breathing Rash, fever, and swollen lymph nodes Thoughts of suicide or self harm, worsening mood, feelings of depression Trouble breathing Unusual  changes in mood or behavior in children after use such as difficulty concentrating, hostility, or restlessness Side effects that usually do not require medical attention (report to your care team if they continue or are bothersome): Dizziness Drowsiness Nausea Swelling of ankles, feet, or hands Vomiting This list may not describe all possible side effects. Call your doctor for medical advice about side effects. You may report side effects to FDA at 1-800-FDA-1088. Where should I keep my medication? Keep out of reach of children and pets. Store at room temperature between 15 and 30 degrees C (59 and 86 degrees F). Get rid of any unused medication after the expiration date. This medication may cause accidental overdose and death if taken by other adults, children, or pets. To get rid of medications that are no longer needed or have expired: Take the medication to a medication take-back program. Check with your pharmacy or law enforcement to find a location. If you cannot return the medication, check the label or package insert to see if the medication should be thrown out in  the garbage or flushed down the toilet. If you are not sure, ask your care team. If it is safe to put it in the trash, empty the medication out of the container. Mix the medication with cat litter, dirt, coffee grounds, or other unwanted substance. Seal the mixture in a bag or container. Put it in the trash. NOTE: This sheet is a summary. It may not cover all possible information. If you have questions about this medicine, talk to your doctor, pharmacist, or health care provider.  2024 Elsevier/Gold Standard (2022-04-12 00:00:00)

## 2023-10-04 NOTE — Telephone Encounter (Signed)
Order for ONO sent to [facility].

## 2023-10-05 ENCOUNTER — Telehealth: Payer: Self-pay | Admitting: Neurology

## 2023-10-05 NOTE — Telephone Encounter (Signed)
 Referral for physical therapy fax to [facility]. Phone: [phone], Fax: [phone]

## 2023-10-09 ENCOUNTER — Encounter: Payer: Self-pay | Admitting: Cardiology

## 2023-10-10 NOTE — Addendum Note (Signed)
 Addended by: Eleonore Chiquito on: 10/10/2023 08:10 AM   Modules accepted: Orders

## 2023-10-13 DIAGNOSIS — G4481 Hypnic headache: Secondary | ICD-10-CM | POA: Diagnosis not present

## 2023-10-17 NOTE — Telephone Encounter (Signed)
 Received ONO results.

## 2023-10-18 DIAGNOSIS — M542 Cervicalgia: Secondary | ICD-10-CM | POA: Diagnosis not present

## 2023-10-18 DIAGNOSIS — M7918 Myalgia, other site: Secondary | ICD-10-CM | POA: Diagnosis not present

## 2023-10-18 DIAGNOSIS — S161XXD Strain of muscle, fascia and tendon at neck level, subsequent encounter: Secondary | ICD-10-CM | POA: Diagnosis not present

## 2023-10-23 ENCOUNTER — Ambulatory Visit (HOSPITAL_BASED_OUTPATIENT_CLINIC_OR_DEPARTMENT_OTHER)
Admission: RE | Admit: 2023-10-23 | Discharge: 2023-10-23 | Disposition: A | Discharge status: Home / Self Care | Source: Ambulatory Visit | Attending: Cardiology | Admitting: Cardiology

## 2023-10-23 DIAGNOSIS — I700 Atherosclerosis of aorta: Secondary | ICD-10-CM | POA: Insufficient documentation

## 2023-10-23 DIAGNOSIS — I7789 Other specified disorders of arteries and arterioles: Secondary | ICD-10-CM | POA: Insufficient documentation

## 2023-10-23 DIAGNOSIS — I7121 Aneurysm of the ascending aorta, without rupture: Secondary | ICD-10-CM | POA: Diagnosis not present

## 2023-10-24 ENCOUNTER — Encounter: Payer: Self-pay | Admitting: Emergency Medicine

## 2023-10-24 ENCOUNTER — Ambulatory Visit (INDEPENDENT_AMBULATORY_CARE_PROVIDER_SITE_OTHER): Admitting: Emergency Medicine

## 2023-10-24 VITALS — BP 130/78 | HR 84 | Temp 98.3°F | Ht 63.0 in | Wt 159.0 lb

## 2023-10-24 DIAGNOSIS — R7303 Prediabetes: Secondary | ICD-10-CM | POA: Diagnosis not present

## 2023-10-24 DIAGNOSIS — N393 Stress incontinence (female) (male): Secondary | ICD-10-CM | POA: Insufficient documentation

## 2023-10-24 DIAGNOSIS — I10 Essential (primary) hypertension: Secondary | ICD-10-CM

## 2023-10-24 DIAGNOSIS — E785 Hyperlipidemia, unspecified: Secondary | ICD-10-CM | POA: Diagnosis not present

## 2023-10-24 DIAGNOSIS — I7789 Other specified disorders of arteries and arterioles: Secondary | ICD-10-CM

## 2023-10-24 DIAGNOSIS — I351 Nonrheumatic aortic (valve) insufficiency: Secondary | ICD-10-CM | POA: Diagnosis not present

## 2023-10-24 DIAGNOSIS — I700 Atherosclerosis of aorta: Secondary | ICD-10-CM | POA: Diagnosis not present

## 2023-10-24 DIAGNOSIS — C19 Malignant neoplasm of rectosigmoid junction: Secondary | ICD-10-CM | POA: Diagnosis not present

## 2023-10-24 LAB — URINALYSIS
Bilirubin Urine: NEGATIVE
Hgb urine dipstick: NEGATIVE
Ketones, ur: NEGATIVE
Leukocytes,Ua: NEGATIVE
Nitrite: NEGATIVE
Specific Gravity, Urine: 1.015 (ref 1.000–1.030)
Total Protein, Urine: NEGATIVE
Urine Glucose: NEGATIVE
Urobilinogen, UA: 0.2 (ref 0.0–1.0)
pH: 6 (ref 5.0–8.0)

## 2023-10-24 LAB — LIPID PANEL
Cholesterol: 156 mg/dL (ref 0–200)
HDL: 73.4 mg/dL (ref >39.00)
LDL Cholesterol: 62 mg/dL (ref 0–99)
NonHDL: 82.42
Total CHOL/HDL Ratio: 2
Triglycerides: 100 mg/dL (ref 0.0–149.0)
VLDL: 20 mg/dL (ref 0.0–40.0)

## 2023-10-24 LAB — CBC WITH DIFFERENTIAL/PLATELET
Basophils Absolute: 0 10*3/uL (ref 0.0–0.1)
Basophils Relative: 0.7 % (ref 0.0–3.0)
Eosinophils Absolute: 0.1 10*3/uL (ref 0.0–0.7)
Eosinophils Relative: 1.7 % (ref 0.0–5.0)
HCT: 36.1 % (ref 36.0–46.0)
Hemoglobin: 12.2 g/dL (ref 12.0–15.0)
Lymphocytes Relative: 24 % (ref 12.0–46.0)
Lymphs Abs: 1.5 10*3/uL (ref 0.7–4.0)
MCHC: 33.8 g/dL (ref 30.0–36.0)
MCV: 92.6 fl (ref 78.0–100.0)
Monocytes Absolute: 0.7 10*3/uL (ref 0.1–1.0)
Monocytes Relative: 11 % (ref 3.0–12.0)
Neutro Abs: 3.8 10*3/uL (ref 1.4–7.7)
Neutrophils Relative %: 62.6 % (ref 43.0–77.0)
Platelets: 158 10*3/uL (ref 150.0–400.0)
RBC: 3.9 Mil/uL (ref 3.87–5.11)
RDW: 13 % (ref 11.5–15.5)
WBC: 6.1 10*3/uL (ref 4.0–10.5)

## 2023-10-24 LAB — COMPREHENSIVE METABOLIC PANEL WITH GFR
ALT: 20 U/L (ref 0–35)
AST: 21 U/L (ref 0–37)
Albumin: 4.4 g/dL (ref 3.5–5.2)
Alkaline Phosphatase: 50 U/L (ref 39–117)
BUN: 11 mg/dL (ref 6–23)
CO2: 27 meq/L (ref 19–32)
Calcium: 9.2 mg/dL (ref 8.4–10.5)
Chloride: 103 meq/L (ref 96–112)
Creatinine, Ser: 0.71 mg/dL (ref 0.40–1.20)
GFR: 88.13 mL/min (ref >60.00)
Glucose, Bld: 145 mg/dL — ABNORMAL HIGH (ref 70–99)
Potassium: 4.5 meq/L (ref 3.5–5.1)
Sodium: 138 meq/L (ref 135–145)
Total Bilirubin: 0.8 mg/dL (ref 0.2–1.2)
Total Protein: 6.7 g/dL (ref 6.0–8.3)

## 2023-10-24 LAB — HEMOGLOBIN A1C: Hgb A1c MFr Bld: 6.5 % (ref 4.6–6.5)

## 2023-10-24 NOTE — Assessment & Plan Note (Signed)
 Progressively getting worse and affecting quality of life Recommend evaluation by urologist Referral placed today.

## 2023-10-24 NOTE — Patient Instructions (Signed)
 Health Maintenance After Age 67 After age 4, you are at a higher risk for certain long-term diseases and infections as well as injuries from falls. Falls are a major cause of broken bones and head injuries in people who are older than age 47. Getting regular preventive care can help to keep you healthy and well. Preventive care includes getting regular testing and making lifestyle changes as recommended by your health care provider. Talk with your health care provider about: Which screenings and tests you should have. A screening is a test that checks for a disease when you have no symptoms. A diet and exercise plan that is right for you. What should I know about screenings and tests to prevent falls? Screening and testing are the best ways to find a health problem early. Early diagnosis and treatment give you the best chance of managing medical conditions that are common after age 37. Certain conditions and lifestyle choices may make you more likely to have a fall. Your health care provider may recommend: Regular vision checks. Poor vision and conditions such as cataracts can make you more likely to have a fall. If you wear glasses, make sure to get your prescription updated if your vision changes. Medicine review. Work with your health care provider to regularly review all of the medicines you are taking, including over-the-counter medicines. Ask your health care provider about any side effects that may make you more likely to have a fall. Tell your health care provider if any medicines that you take make you feel dizzy or sleepy. Strength and balance checks. Your health care provider may recommend certain tests to check your strength and balance while standing, walking, or changing positions. Foot health exam. Foot pain and numbness, as well as not wearing proper footwear, can make you more likely to have a fall. Screenings, including: Osteoporosis screening. Osteoporosis is a condition that causes  the bones to get weaker and break more easily. Blood pressure screening. Blood pressure changes and medicines to control blood pressure can make you feel dizzy. Depression screening. You may be more likely to have a fall if you have a fear of falling, feel depressed, or feel unable to do activities that you used to do. Alcohol use screening. Using too much alcohol can affect your balance and may make you more likely to have a fall. Follow these instructions at home: Lifestyle Do not drink alcohol if: Your health care provider tells you not to drink. If you drink alcohol: Limit how much you have to: 0-1 drink a day for women. 0-2 drinks a day for men. Know how much alcohol is in your drink. In the [place] one drink equals one 12 oz bottle of beer (355 mL), one 5 oz glass of wine (148 mL), or one 1 oz glass of hard liquor (44 mL). Do not use any products that contain nicotine or tobacco. These products include cigarettes, chewing tobacco, and vaping devices, such as e-cigarettes. If you need help quitting, ask your health care provider. Activity  Follow a regular exercise program to stay fit. This will help you maintain your balance. Ask your health care provider what types of exercise are appropriate for you. If you need a cane or walker, use it as recommended by your health care provider. Wear supportive shoes that have nonskid soles. Safety  Remove any tripping hazards, such as rugs, cords, and clutter. Install safety equipment such as grab bars in bathrooms and safety rails on stairs. Keep rooms and walkways  well-lit. General instructions Talk with your health care provider about your risks for falling. Tell your health care provider if: You fall. Be sure to tell your health care provider about all falls, even ones that seem minor. You feel dizzy, tiredness (fatigue), or off-balance. Take over-the-counter and prescription medicines only as told by your health care provider. These include  supplements. Eat a healthy diet and maintain a healthy weight. A healthy diet includes low-fat dairy products, low-fat (lean) meats, and fiber from whole grains, beans, and lots of fruits and vegetables. Stay current with your vaccines. Schedule regular health, dental, and eye exams. Summary Having a healthy lifestyle and getting preventive care can help to protect your health and wellness after age 11. Screening and testing are the best way to find a health problem early and help you avoid having a fall. Early diagnosis and treatment give you the best chance for managing medical conditions that are more common for people who are older than age 28. Falls are a major cause of broken bones and head injuries in people who are older than age 48. Take precautions to prevent a fall at home. Work with your health care provider to learn what changes you can make to improve your health and wellness and to prevent falls. This information is not intended to replace advice given to you by your health care provider. Make sure you discuss any questions you have with your health care provider. Document Revised: 11/16/2020 Document Reviewed: 11/16/2020 Elsevier Patient Education  2024 [employer].

## 2023-10-24 NOTE — Assessment & Plan Note (Signed)
Diet and nutrition discussed. Cardiovascular risk associated with diabetes discussed.

## 2023-10-24 NOTE — Assessment & Plan Note (Signed)
Diet and nutrition discussed.  Continue simvastatin 40 mg daily.

## 2023-10-24 NOTE — Assessment & Plan Note (Signed)
Clinically stable.  No concerns.

## 2023-10-24 NOTE — Progress Notes (Signed)
 [name] [name] 68 y.o.   Chief Complaint  Patient presents with   Medical Management of Chronic Issues    Blood work and bladder weakness     HISTORY OF PRESENT ILLNESS: This is a 68 y.o. female A1A here for follow-up of chronic medical conditions Also complaining of bladder stress incontinence getting worse  HPI   Prior to Admission medications   Medication Sig Start Date End Date Taking? Authorizing Provider  Artificial Tear Ointment (DRY EYES OP) Apply 1 drop to eye as needed (dry eyes).   Yes [provider]  finasteride (PROPECIA) 1 MG tablet Take 1 mg by mouth daily. 10/29/21  Yes [provider]  Gabapentin, Once-Daily, 600 MG TABS Take 1 tablet (600 mg total) by mouth at bedtime. 10/04/23  Yes Anson Fret, MD  linaclotide (LINZESS) 290 MCG CAPS capsule Take 290 mcg by mouth daily before breakfast.   Yes [provider]  lisinopril (ZESTRIL) 40 MG tablet TAKE 1 TABLET BY MOUTH EVERY DAY 10/30/22  Yes Yves Fodor, Eilleen Kempf, MD  meloxicam (MOBIC) 15 MG tablet TAKE 1 TABLET (15 MG TOTAL) BY MOUTH DAILY. 01/04/23  Yes Juno Bozard, Eilleen Kempf, MD  simvastatin (ZOCOR) 40 MG tablet Take 1 tablet (40 mg total) by mouth daily at 6 PM. 06/01/23  Yes Revankar, Aundra Dubin, MD    No Known Allergies  Patient Active Problem List   Diagnosis Date Noted   Cervicalgia 10/04/2023   Myofascial pain syndrome, cervical 10/04/2023   Cervical stenosis of spinal canal 10/04/2023   DDD (degenerative disc disease), cervical 10/04/2023   Hyperkalemia 06/12/2023   Blood transfusion without reported diagnosis    Essential hypertension, benign    Hypertension    Hyperlipidemia    Neuromuscular disorder (HCC)    Neuropathy 10/25/2021   Insomnia 10/25/2021   History of blood transfusion 10/25/2021   Heart murmur 10/25/2021   Diverticulosis 10/25/2021   Cancer (HCC) 10/25/2021   Arthritis 10/25/2021   Aortic regurgitation 10/25/2021   Anemia 10/25/2021   Monoallelic  mutation of SDHA gene 06/25/2021   History of colonic polyps 05/21/2021   Atherosclerosis of aorta (HCC) 03/08/2021   Steatosis of liver 03/08/2021   Dyslipidemia 03/08/2021   Prediabetes 03/06/2020   Abnormal echocardiogram 05/08/2019   Enlarged thoracic aorta (HCC) 04/17/2019   Elevated cholesterol 02/14/2018   Status post bilateral total hip replacement 05/27/2014   PERIPHERAL NEUROPATHY 10/13/2007   Essential hypertension 10/13/2007   HERNIA, UMBILICAL 10/13/2007   COLONIC POLYPS, ADENOMATOUS 02/20/2003   DIVERTICULOSIS, COLON 02/20/2003   History of malignant neoplasm of large intestine 02/20/2003   Rectosigmoid cancer (HCC) 2004    Past Medical History:  Diagnosis Date   Abnormal echocardiogram 05/08/2019   Anemia    hx of, not at this time   ANXIETY DISORDER- mixed disorder w/ depressive symptoms 02/07/2014   Aortic regurgitation    Arthritis    "right hip; left hip; most of my joints" (05/27/2014)   Atherosclerosis of aorta (HCC) 03/08/2021   Blood transfusion without reported diagnosis    [employer]reesia 09/04/2020   Cancer (HCC)    [facility] 09/04/2020   COLONIC POLYPS, ADENOMATOUS 02/20/2003   Qualifier: Diagnosis of   By: Gwinda Passe RN, Carissa         Depression    Diverticulosis    DIVERTICULOSIS, COLON 02/20/2003   Qualifier: Diagnosis of   By: Gwinda Passe RN, Carissa      Replacing diagnoses that were inactivated after the 10/10/22 regulatory import  Dyslipidemia 03/08/2021   Elevated cholesterol 02/14/2018   Enlarged thoracic aorta (HCC) 04/17/2019   Essential hypertension 10/13/2007   Qualifier: Diagnosis of   By: Gwinda Passe RN, Carissa         Essential hypertension, benign    takes Lisinopril-HCTZ daily   Heart murmur    HERNIA, UMBILICAL 10/13/2007   Qualifier: Diagnosis of   By: Gwinda Passe RN, Carissa      Replacing diagnoses that were inactivated after the 10/10/22 regulatory import     History of blood transfusion    no abnormal reaction noted 2015   History of  colonic polyps 05/21/2021   History of malignant neoplasm of large intestine 02/20/2003   Qualifier: Diagnosis of   By: Gwinda Passe RN, Carissa         Hyperlipidemia    [facility]reesia 09/04/2020   Hypertension    [facility]ia 09/04/2020   Insomnia    doesn't take any meds   Monoallelic mutation of SDHA gene 06/25/2021   Neuromuscular disorder (HCC)    Neuropathy    since chemo and radiation   Nocturia    Paresthesia of upper extremity 04/03/2023   PERIPHERAL NEUROPATHY 10/13/2007   Qualifier: Diagnosis of   By: Gwinda Passe RN, Carissa      Replacing diagnoses that were inactivated after the 10/10/22 regulatory import     Prediabetes 03/06/2020   Rectosigmoid cancer (HCC) 2004   S/P Surgery, Chemo, Rad Tx,  (after chemo treatment menstral periods have ceased).   Status post bilateral total hip replacement 05/27/2014   Steatosis of liver 03/08/2021    Past Surgical History:  Procedure Laterality Date   ANAL RECTAL MANOMETRY N/A 07/17/2017   Procedure: ANO RECTAL MANOMETRY;  Surgeon: Napoleon Form, MD;  Location: [facility];  Service: Endoscopy;  Laterality: N/A;   COLECTOMY  Aug 2004   S/P Low Anterior Resxn (Sigmoid Colon)   COLON SURGERY N/A    [facility]reesia 09/04/2020   COLONOSCOPY     DILATION AND CURETTAGE OF UTERUS  1991   JOINT REPLACEMENT N/A    [facility]reesia 09/04/2020   LUMBAR DISC SURGERY  Feb 2007   SPINE SURGERY N/A    [facility]reesia 09/04/2020   TOTAL HIP ARTHROPLASTY Right 05/27/2014   Procedure: RIGHT TOTAL HIP ARTHROPLASTY ANTERIOR APPROACH;  Surgeon: Velna Ochs, MD;  Location: [facility] OR;  Service: Orthopedics;  Laterality: Right;   TOTAL HIP ARTHROPLASTY Left 08/25/2015   Procedure: TOTAL HIP ARTHROPLASTY ANTERIOR APPROACH;  Surgeon: Marcene Corning, MD;  Location: [facility] OR;  Service: Orthopedics;  Laterality: Left;    Social History   Socioeconomic History   Marital status: Married    Spouse name: Alinda Money   Number of children: 1   Years of education: Not on file   Highest education  level: Master's degree (e.g., MA, MS, MEng, MEd, MSW, MBA)  Occupational History   Occupation: office work  Tobacco Use   Smoking status: Former    Types: Cigarettes    Passive exposure: Past   Smokeless tobacco: Never   Tobacco comments:    "smoked some socially in my college days"  Vaping Use   Vaping status: Never Used  Substance and Sexual Activity   Alcohol use: Yes    Comment: occasionally beer/wine   Drug use: No   Sexual activity: Yes    Partners: Male    Birth control/protection: None  Other Topics Concern   Not on file  Social History Narrative   Lives with husband   Exercise walking 2 x  weekly   Patient's family history -  UNKNOWN ADOPTED   Social Drivers of Health   Financial Resource Strain: Low Risk  (10/23/2023)   Overall Financial Resource Strain (CARDIA)    Difficulty of Paying Living Expenses: Not hard at all  Food Insecurity: No Food Insecurity (10/23/2023)   Hunger Vital Sign    Worried About Running Out of Food in the Last Year: Never true    Ran Out of Food in the Last Year: Never true  Transportation Needs: No Transportation Needs (10/23/2023)   PRAPARE - Administrator, Civil Service (Medical): No    Lack of Transportation (Non-Medical): No  Physical Activity: Insufficiently Active (10/23/2023)   Exercise Vital Sign    Days of Exercise per Week: 1 day    Minutes of Exercise per Session: 20 min  Stress: No Stress Concern Present (10/23/2023)   [employer] of Occupational Health - Occupational Stress Questionnaire    Feeling of Stress : Only a little  Social Connections: Socially Integrated (10/23/2023)   Social Connection and Isolation Panel [NHANES]    Frequency of Communication with Friends and Family: More than three times a week    Frequency of Social Gatherings with Friends and Family: More than three times a week    Attends Religious Services: 1 to 4 times per year    Active Member of [employer] or Organizations: Yes    Attends  Engineer, structural: More than 4 times per year    Marital Status: Married  Catering manager Violence: Not on file    Family History  Adopted: Yes  Problem Relation Age of Onset   Colitis Neg Hx    Colon cancer Neg Hx    Colon polyps Neg Hx    Esophageal cancer Neg Hx    Rectal cancer Neg Hx    Stomach cancer Neg Hx      Review of Systems  Constitutional: Negative.  Negative for chills, fever and weight loss.  HENT: Negative.  Negative for congestion and sore throat.   Respiratory: Negative.  Negative for cough and shortness of breath.   Cardiovascular: Negative.  Negative for chest pain and palpitations.  Gastrointestinal:  Negative for abdominal pain, diarrhea, nausea and vomiting.  Genitourinary: Negative.  Negative for dysuria and hematuria.  Neurological: Negative.  Negative for dizziness and headaches.  All other systems reviewed and are negative.   Vitals:   10/24/23 1052  BP: 130/78  Pulse: 84  Temp: 98.3 F (36.8 C)  SpO2: 99%    Physical Exam Vitals reviewed.  Constitutional:      Appearance: Normal appearance.  HENT:     Head: Normocephalic.     Mouth/Throat:     Mouth: Mucous membranes are moist.     Pharynx: Oropharynx is clear.  Eyes:     Extraocular Movements: Extraocular movements intact.     Pupils: Pupils are equal, round, and reactive to light.  Cardiovascular:     Rate and Rhythm: Normal rate and regular rhythm.     Pulses: Normal pulses.     Heart sounds: Murmur heard.  Pulmonary:     Effort: Pulmonary effort is normal.     Breath sounds: Normal breath sounds.  Abdominal:     Palpations: Abdomen is soft.     Tenderness: There is no abdominal tenderness.  Musculoskeletal:     Cervical back: No tenderness.  Lymphadenopathy:     Cervical: No cervical adenopathy.  Skin:    General: Skin  is warm and dry.  Neurological:     General: No focal deficit present.     Mental Status: She is alert and oriented to person, place, and  time.  Psychiatric:        Mood and Affect: Mood normal.        Behavior: Behavior normal.      ASSESSMENT & PLAN: A total of 44 minutes was spent with the patient and counseling/coordination of care regarding preparing for this visit, review of most recent office visit notes, review of multiple chronic medical conditions and their management, review of all medications, review of most recent bloodwork results, review of health maintenance items, education on nutrition, prognosis, documentation, and need for follow up.   Problem List Items Addressed This Visit       Cardiovascular and Mediastinum   Essential hypertension - Primary   BP Readings from Last 3 Encounters:  10/24/23 130/78  10/04/23 (!) 143/70  07/24/23 122/68  Well-controlled hypertension Continue lisinopril 40 mg daily Cardiovascular risks associated with hypertension discussed Diet and nutrition discusse       Relevant Orders   CBC with Differential/Platelet   Comprehensive metabolic panel with GFR   Hemoglobin A1c   Lipid panel   Urinalysis   Enlarged thoracic aorta (HCC)   Stable.  Continues surveillance by cardiology      Atherosclerosis of aorta (HCC)   Diet and nutrition discussed Continue simvastatin 40 mg daily      Relevant Orders   CBC with Differential/Platelet   Comprehensive metabolic panel with GFR   Hemoglobin A1c   Lipid panel   Urinalysis   Aortic regurgitation   Clinically stable.  No signs of congestive heart failure Asymptomatic.      Relevant Orders   CBC with Differential/Platelet   Comprehensive metabolic panel with GFR   Hemoglobin A1c   Lipid panel   Urinalysis     Digestive   Rectosigmoid cancer (HCC)   Clinically stable. No concerns         Other   Prediabetes   Diet and nutrition discussed Cardiovascular risk associated with diabetes discussed      Dyslipidemia   Diet and nutrition discussed Continue simvastatin 40 mg daily      Stress incontinence    Progressively getting worse and affecting quality of life Recommend evaluation by urologist Referral placed today.      Relevant Orders   Ambulatory referral to Urology   CBC with Differential/Platelet   Comprehensive metabolic panel with GFR   Hemoglobin A1c   Lipid panel   Urinalysis   Patient Instructions  Health Maintenance After Age 51 After age 44, you are at a higher risk for certain long-term diseases and infections as well as injuries from falls. Falls are a major cause of broken bones and head injuries in people who are older than age 65. Getting regular preventive care can help to keep you healthy and well. Preventive care includes getting regular testing and making lifestyle changes as recommended by your health care provider. Talk with your health care provider about: Which screenings and tests you should have. A screening is a test that checks for a disease when you have no symptoms. A diet and exercise plan that is right for you. What should I know about screenings and tests to prevent falls? Screening and testing are the best ways to find a health problem early. Early diagnosis and treatment give you the best chance of managing medical conditions that are common  after age 51. Certain conditions and lifestyle choices may make you more likely to have a fall. Your health care provider may recommend: Regular vision checks. Poor vision and conditions such as cataracts can make you more likely to have a fall. If you wear glasses, make sure to get your prescription updated if your vision changes. Medicine review. Work with your health care provider to regularly review all of the medicines you are taking, including over-the-counter medicines. Ask your health care provider about any side effects that may make you more likely to have a fall. Tell your health care provider if any medicines that you take make you feel dizzy or sleepy. Strength and balance checks. Your health care  provider may recommend certain tests to check your strength and balance while standing, walking, or changing positions. Foot health exam. Foot pain and numbness, as well as not wearing proper footwear, can make you more likely to have a fall. Screenings, including: Osteoporosis screening. Osteoporosis is a condition that causes the bones to get weaker and break more easily. Blood pressure screening. Blood pressure changes and medicines to control blood pressure can make you feel dizzy. Depression screening. You may be more likely to have a fall if you have a fear of falling, feel depressed, or feel unable to do activities that you used to do. Alcohol use screening. Using too much alcohol can affect your balance and may make you more likely to have a fall. Follow these instructions at home: Lifestyle Do not drink alcohol if: Your health care provider tells you not to drink. If you drink alcohol: Limit how much you have to: 0-1 drink a day for women. 0-2 drinks a day for men. Know how much alcohol is in your drink. In the [place] one drink equals one 12 oz bottle of beer (355 mL), one 5 oz glass of wine (148 mL), or one 1 oz glass of hard liquor (44 mL). Do not use any products that contain nicotine or tobacco. These products include cigarettes, chewing tobacco, and vaping devices, such as e-cigarettes. If you need help quitting, ask your health care provider. Activity  Follow a regular exercise program to stay fit. This will help you maintain your balance. Ask your health care provider what types of exercise are appropriate for you. If you need a cane or walker, use it as recommended by your health care provider. Wear supportive shoes that have nonskid soles. Safety  Remove any tripping hazards, such as rugs, cords, and clutter. Install safety equipment such as grab bars in bathrooms and safety rails on stairs. Keep rooms and walkways well-lit. General instructions Talk with your health  care provider about your risks for falling. Tell your health care provider if: You fall. Be sure to tell your health care provider about all falls, even ones that seem minor. You feel dizzy, tiredness (fatigue), or off-balance. Take over-the-counter and prescription medicines only as told by your health care provider. These include supplements. Eat a healthy diet and maintain a healthy weight. A healthy diet includes low-fat dairy products, low-fat (lean) meats, and fiber from whole grains, beans, and lots of fruits and vegetables. Stay current with your vaccines. Schedule regular health, dental, and eye exams. Summary Having a healthy lifestyle and getting preventive care can help to protect your health and wellness after age 73. Screening and testing are the best way to find a health problem early and help you avoid having a fall. Early diagnosis and treatment give you the best  chance for managing medical conditions that are more common for people who are older than age 53. Falls are a major cause of broken bones and head injuries in people who are older than age 39. Take precautions to prevent a fall at home. Work with your health care provider to learn what changes you can make to improve your health and wellness and to prevent falls. This information is not intended to replace advice given to you by your health care provider. Make sure you discuss any questions you have with your health care provider. Document Revised: 11/16/2020 Document Reviewed: 11/16/2020 Elsevier Patient Education  2024 Elsevier Inc.      Maryagnes Small, MD [facility]

## 2023-10-24 NOTE — Assessment & Plan Note (Signed)
Stable.  Continues surveillance by cardiology

## 2023-10-24 NOTE — Assessment & Plan Note (Signed)
 Clinically stable.  No signs of congestive heart failure Asymptomatic.

## 2023-10-24 NOTE — Assessment & Plan Note (Signed)
 BP Readings from Last 3 Encounters:  10/24/23 130/78  10/04/23 (!) 143/70  07/24/23 122/68  Well-controlled hypertension Continue lisinopril 40 mg daily Cardiovascular risks associated with hypertension discussed Diet and nutrition discusse

## 2023-11-01 NOTE — Telephone Encounter (Signed)
 Dr Tresia Fruit has advised to refer for sleep consult. I called the patient and let her know. She is amenable. Referral placed to Dr Omar Bibber.

## 2023-11-01 NOTE — Addendum Note (Signed)
 Addended by: Burns Carwin on: 11/01/2023 03:38 PM   Modules accepted: Orders

## 2023-11-09 ENCOUNTER — Other Ambulatory Visit: Payer: Self-pay | Admitting: Emergency Medicine

## 2023-11-09 DIAGNOSIS — I10 Essential (primary) hypertension: Secondary | ICD-10-CM

## 2023-11-13 DIAGNOSIS — M7061 Trochanteric bursitis, right hip: Secondary | ICD-10-CM | POA: Diagnosis not present

## 2023-11-15 ENCOUNTER — Telehealth: Payer: Self-pay | Admitting: Cardiology

## 2023-11-15 NOTE — Telephone Encounter (Signed)
 Patient requesting to switch from Dr. Lafayette Pierre to Dr. Albert Huff due to location, is this OK?

## 2023-11-17 NOTE — Telephone Encounter (Signed)
Sure.   ST

## 2023-11-30 ENCOUNTER — Ambulatory Visit: Admitting: Neurology

## 2023-11-30 ENCOUNTER — Encounter: Payer: Self-pay | Admitting: Neurology

## 2023-11-30 VITALS — BP 150/66 | HR 73 | Ht 63.0 in | Wt 153.0 lb

## 2023-11-30 DIAGNOSIS — G4734 Idiopathic sleep related nonobstructive alveolar hypoventilation: Secondary | ICD-10-CM | POA: Diagnosis not present

## 2023-11-30 DIAGNOSIS — R519 Headache, unspecified: Secondary | ICD-10-CM | POA: Diagnosis not present

## 2023-11-30 DIAGNOSIS — E663 Overweight: Secondary | ICD-10-CM | POA: Diagnosis not present

## 2023-11-30 DIAGNOSIS — R0683 Snoring: Secondary | ICD-10-CM | POA: Diagnosis not present

## 2023-11-30 DIAGNOSIS — Z9189 Other specified personal risk factors, not elsewhere classified: Secondary | ICD-10-CM | POA: Diagnosis not present

## 2023-11-30 DIAGNOSIS — R351 Nocturia: Secondary | ICD-10-CM | POA: Diagnosis not present

## 2023-11-30 NOTE — Progress Notes (Signed)
 Subjective:    Patient ID: [name] [name] is a 68 y.o. female.  HPI    Debbra Fairy, MD, PhD [facility] [address], [address] [address] [place], [place] [address]  Dear [name] [name],  I saw your patient, [name] [name], upon your kind request in my sleep clinic today for initial consultation of her sleep disorder, in particular, concern for underlying obstructive sleep apnea.  The patient is unaccompanied today.  As you know, [name] [name] is a 68 year old female with an underlying medical history of anemia, aortic regurgitation, diverticulosis, anxiety, hypertension, hyperlipidemia, heart murmur, neuropathy, hepatic steatosis, neck pain, recurrent headaches, history of colon cancer, irritable bowel syndrome, pelvic floor dysfunction, chronic constipation, and low back pain, who reports snoring and excessive daytime somnolence, as well as nocturia and morning headaches. Her headaches in AM are usually bifrontal or behind the eyes, dull and achy, not as severe as a migraine.  She also has chronic neck pain which actually improved after she started gabapentin , which is currently 600 mg at bedtime.  Her husband has never mentioned any apneas to her.  At times she has woken up with a sense of not breathing well or gasping even.  She is not aware of any family history of sleep apnea, she is adopted.  She goes to bed around 10:30 PM and rise time is generally between 8 and 8:30 AM.  She is retired as of October 2024.  She used to work in Nurse, learning disability as an Conservator, museum/gallery.  She lives with her husband and 78 year old son.  She has nocturia about 2-3 times per average night and is scheduled to see a urologist.  She drinks caffeine in the form of tea, about 3 cups/day.  She drinks alcohol about daily, up to 2 glasses of wine.  Her Epworth sleepiness score is 2 out of 24, fatigue severity score is 31 out of 63.  I reviewed your office note from 10/04/2023.  She had a recent overnight pulse  oximetry test which showed oxygen desaturations.  I reviewed the ONO report from 10/11/2023.  Average oxygen saturation 91.2%, nadir was 79% with time below or at 88% saturation of 5 minutes.  She does not watch TV in her bedroom.  They have 1 dog in the household but the dog does not sleep in their bedroom with them.  Her Past Medical History Is Significant For: Past Medical History:  Diagnosis Date   Abnormal echocardiogram 05/08/2019   Anemia    hx of, not at this time   ANXIETY DISORDER- mixed disorder w/ depressive symptoms 02/07/2014   Aortic regurgitation    Arthritis    "right hip; left hip; most of my joints" (05/27/2014)   Atherosclerosis of aorta (HCC) 03/08/2021   Blood transfusion without reported diagnosis    Phreesia 09/04/2020   Cancer (HCC)    Phreesia 09/04/2020   COLONIC POLYPS, ADENOMATOUS 02/20/2003   Qualifier: Diagnosis of   By: Hercules Lombard RN, Carissa         Depression    Diverticulosis    DIVERTICULOSIS, COLON 02/20/2003   Qualifier: Diagnosis of   By: Hercules Lombard RN, Carissa      Replacing diagnoses that were inactivated after the 10/10/22 regulatory import     Dyslipidemia 03/08/2021   Elevated cholesterol 02/14/2018   Enlarged thoracic aorta (HCC) 04/17/2019   Essential hypertension 10/13/2007   Qualifier: Diagnosis of   By: Hercules Lombard RN, Jacky Massing         Essential hypertension, benign  takes Lisinopril -HCTZ daily   Heart murmur    HERNIA, UMBILICAL 10/13/2007   Qualifier: Diagnosis of   By: Hercules Lombard RN, Carissa      Replacing diagnoses that were inactivated after the 10/10/22 regulatory import     History of blood transfusion    no abnormal reaction noted 2015   History of colonic polyps 05/21/2021   History of malignant neoplasm of large intestine 02/20/2003   Qualifier: Diagnosis of   By: Hercules Lombard RN, Carissa         Hyperlipidemia    Phreesia 09/04/2020   Hypertension    Phreesia 09/04/2020   Insomnia    doesn't take any meds   Monoallelic mutation of SDHA  gene 06/25/2021   Neuromuscular disorder (HCC)    Neuropathy    since chemo and radiation   Nocturia    Paresthesia of upper extremity 04/03/2023   PERIPHERAL NEUROPATHY 10/13/2007   Qualifier: Diagnosis of   By: Hercules Lombard RN, Carissa      Replacing diagnoses that were inactivated after the 10/10/22 regulatory import     Prediabetes 03/06/2020   Rectosigmoid cancer (HCC) 2004   S/P Surgery, Chemo, Rad Tx,  (after chemo treatment menstral periods have ceased).   Status post bilateral total hip replacement 05/27/2014   Steatosis of liver 03/08/2021    Her Past Surgical History Is Significant For: Past Surgical History:  Procedure Laterality Date   ANAL RECTAL MANOMETRY N/A 07/17/2017   Procedure: ANO RECTAL MANOMETRY;  Surgeon: Sergio Dandy, MD;  Location: [facility];  Service: Endoscopy;  Laterality: N/A;   COLECTOMY  Aug 2004   S/P Low Anterior Resxn (Sigmoid Colon)   COLON SURGERY N/A    Phreesia 09/04/2020   COLONOSCOPY     DILATION AND CURETTAGE OF UTERUS  1991   JOINT REPLACEMENT N/A    Phreesia 09/04/2020   LUMBAR DISC SURGERY  Feb 2007   SPINE SURGERY N/A    Phreesia 09/04/2020   TOTAL HIP ARTHROPLASTY Right 05/27/2014   Procedure: RIGHT TOTAL HIP ARTHROPLASTY ANTERIOR APPROACH;  Surgeon: Alphonzo Ask, MD;  Location: [facility] OR;  Service: Orthopedics;  Laterality: Right;   TOTAL HIP ARTHROPLASTY Left 08/25/2015   Procedure: TOTAL HIP ARTHROPLASTY ANTERIOR APPROACH;  Surgeon: Dayne Even, MD;  Location: [facility] OR;  Service: Orthopedics;  Laterality: Left;    Her Family History Is Significant For: Family History  Adopted: Yes  Problem Relation Age of Onset   Colitis Neg Hx    Colon cancer Neg Hx    Colon polyps Neg Hx    Esophageal cancer Neg Hx    Rectal cancer Neg Hx    Stomach cancer Neg Hx    Sleep apnea Neg Hx     Her Social History Is Significant For: Social History   Socioeconomic History   Marital status: Married    Spouse name: Baker Bon   Number of  children: 1   Years of education: Not on file   Highest education level: Master's degree (e.g., MA, MS, MEng, MEd, MSW, MBA)  Occupational History   Occupation: office work  Tobacco Use   Smoking status: Former    Types: Cigarettes    Passive exposure: Past   Smokeless tobacco: Never   Tobacco comments:    "smoked some socially in my college days"  Vaping Use   Vaping status: Never Used  Substance and Sexual Activity   Alcohol use: Yes    Alcohol/week: 14.0 standard drinks of alcohol    Types: 14  Glasses of wine per week   Drug use: No   Sexual activity: Yes    Partners: Male    Birth control/protection: None  Other Topics Concern   Not on file  Social History Narrative   Lives with husband   Exercise walking 2 x weekly   Patient's family history -  UNKNOWN ADOPTED   Retired    Chief Executive Officer Drivers of Corporate investment banker Strain: Low Risk  (10/23/2023)   Overall Financial Resource Strain (CARDIA)    Difficulty of Paying Living Expenses: Not hard at all  Food Insecurity: No Food Insecurity (10/23/2023)   Hunger Vital Sign    Worried About Running Out of Food in the Last Year: Never true    Ran Out of Food in the Last Year: Never true  Transportation Needs: No Transportation Needs (10/23/2023)   PRAPARE - Administrator, Civil Service (Medical): No    Lack of Transportation (Non-Medical): No  Physical Activity: Insufficiently Active (10/23/2023)   Exercise Vital Sign    Days of Exercise per Week: 1 day    Minutes of Exercise per Session: 20 min  Stress: No Stress Concern Present (10/23/2023)   [employer] of Occupational Health - Occupational Stress Questionnaire    Feeling of Stress : Only a little  Social Connections: Socially Integrated (10/23/2023)   Social Connection and Isolation Panel [NHANES]    Frequency of Communication with Friends and Family: More than three times a week    Frequency of Social Gatherings with Friends and Family: More than  three times a week    Attends Religious Services: 1 to 4 times per year    Active Member of [employer] or Organizations: Yes    Attends Engineer, structural: More than 4 times per year    Marital Status: Married    Her Allergies Are:  No Known Allergies:   Her Current Medications Are:  Outpatient Encounter Medications as of 11/30/2023  Medication Sig   Artificial Tear Ointment (DRY EYES OP) Apply 1 drop to eye as needed (dry eyes).   Cyanocobalamin  (B-12 PO) Take by mouth.   finasteride (PROPECIA) 1 MG tablet Take 1 mg by mouth daily.   Gabapentin , Once-Daily, 600 MG TABS Take 1 tablet (600 mg total) by mouth at bedtime.   linaclotide  (LINZESS ) 290 MCG CAPS capsule Take 290 mcg by mouth daily before breakfast.   lisinopril  (ZESTRIL ) 40 MG tablet TAKE 1 TABLET BY MOUTH EVERY DAY   meloxicam  (MOBIC ) 15 MG tablet TAKE 1 TABLET (15 MG TOTAL) BY MOUTH DAILY.   simvastatin  (ZOCOR ) 40 MG tablet Take 1 tablet (40 mg total) by mouth daily at 6 PM.   Facility-Administered Encounter Medications as of 11/30/2023  Medication   0.9 %  sodium chloride  infusion  :   Review of Systems:  Out of a complete 14 point review of systems, all are reviewed and negative with the exception of these symptoms as listed below:  Review of Systems  Neurological:        Pt here for sleep consult Pt snores,headaches,fatigue, controlled BP Pt denies sleep study,cpap machine   ESS:2 Fss:31     Objective:  Neurological Exam  Physical Exam Physical Examination:   Vitals:   11/30/23 1037  BP: (!) 150/66  Pulse: 73    General Examination: The patient is a very pleasant 68 y.o. female in no acute distress. She appears well-developed and well-nourished and well groomed.   HEENT: Normocephalic, atraumatic, pupils  are equal, round and reactive to light, extraocular tracking is good without limitation to gaze excursion or nystagmus noted. Hearing is grossly intact. Face is symmetric with normal facial  animation. Speech is clear with no dysarthria noted. There is no hypophonia. There is no lip, neck/head, jaw or voice tremor. Neck is supple with full range of passive and active motion. There are no carotid bruits on auscultation. Oropharynx exam reveals: mild mouth dryness, adequate dental hygiene and moderate airway crowding, due to small airway entry, larger uvula, thicker tongue.  Tongue protrudes centrally and palate elevates symmetrically, Mallampati class III.  Neck circumference 14 3/8 inches, mild overbite noted.    Chest: Clear to auscultation without wheezing, rhonchi or crackles noted.  Heart: S1+S2+0, regular without a 1/6 systolic murmur noted.   Abdomen: Soft, non-tender and non-distended.  Extremities: There is significant ankle swelling bilaterally, left more than right.  She does recall spraining her ankles bilaterally many years ago.   Skin: Warm and dry without trophic changes noted.   Musculoskeletal: exam reveals no obvious joint deformities.   Neurologically:  Mental status: The patient is awake, alert and oriented in all 4 spheres. Her immediate and remote memory, attention, language skills and fund of knowledge are appropriate. There is no evidence of aphasia, agnosia, apraxia or anomia. Speech is clear with normal prosody and enunciation. Thought process is linear. Mood is normal and affect is normal.  Cranial nerves II - XII are as described above under HEENT exam.  Motor exam: Normal bulk, strength and tone is noted. There is no obvious action or resting tremor.  Fine motor skills and coordination: grossly intact.  Cerebellar testing: No dysmetria or intention tremor. There is no truncal or gait ataxia.  Sensory exam: intact to light touch in the upper and lower extremities.  Gait, station and balance: She stands easily. No veering to one side is noted. No leaning to one side is noted. Posture is age-appropriate and stance is narrow based. Gait shows normal stride  length and normal pace. No problems turning are noted.   Assessment and Plan:   In summary, Edee C Vandehei is a very pleasant 68 y.o.-year old female with an underlying medical history of anemia, aortic regurgitation, diverticulosis, anxiety, hypertension, hyperlipidemia, heart murmur, neuropathy, hepatic steatosis, neck pain, recurrent headaches, history of colon cancer, irritable bowel syndrome, pelvic floor dysfunction, chronic constipation, and low back pain, whose history and physical exam are concerning for sleep disordered breathing, particularly obstructive sleep apnea (OSA). A laboratory attended sleep study is typically considered "gold standard" for evaluation of sleep disordered breathing.   I had a long chat with the patient about my findings and the diagnosis of sleep apnea, particularly OSA, its prognosis and treatment options. We talked about medical/conservative treatments, surgical interventions and non-pharmacological approaches for symptom control. I explained, in particular, the risks and ramifications of untreated moderate to severe OSA, especially with respect to developing cardiovascular disease down the road, including congestive heart failure (CHF), difficult to treat hypertension, cardiac arrhythmias (particularly A-fib), neurovascular complications including TIA, stroke and dementia. Even type 2 diabetes has, in part, been linked to untreated OSA. Symptoms of untreated OSA may include (but may not be limited to) daytime sleepiness, nocturia (i.e. frequent nighttime urination), memory problems, mood irritability and suboptimally controlled or worsening mood disorder such as depression and/or anxiety, lack of energy, lack of motivation, physical discomfort, as well as recurrent headaches, especially morning or nocturnal headaches. We talked about the importance of maintaining a  healthy lifestyle and striving for healthy weight. In addition, we talked about the importance of striving  for and maintaining good sleep hygiene. I recommended a sleep study at this time. I outlined the differences between a laboratory attended sleep study which is considered more comprehensive and accurate over the option of a home sleep test (HST); the latter may lead to underestimation of sleep disordered breathing in some instances and does not help with diagnosing upper airway resistance syndrome and is not accurate enough to diagnose primary central sleep apnea typically. I outlined possible surgical and non-surgical treatment options of OSA, including the use of a positive airway pressure (PAP) device (i.e. CPAP, AutoPAP/APAP or BiPAP in certain circumstances), a custom-made dental device (aka oral appliance, which would require a referral to a specialist dentist or orthodontist typically, and is generally speaking not considered for patients with full dentures or edentulous state), upper airway surgical options, such as traditional UPPP (which is not considered a first-line treatment) or the Inspire device (hypoglossal nerve stimulator, which would involve a referral for consultation with an ENT surgeon, after careful selection, following inclusion criteria - also not first-line treatment). I explained the PAP treatment option to the patient in detail, as this is generally considered first-line treatment.  The patient indicated that she would be willing to try PAP therapy, if the need arises. I explained the importance of being compliant with PAP treatment, not only for insurance purposes but primarily to improve patient's symptoms symptoms, and for the patient's long term health benefit, including to reduce Her cardiovascular risks longer-term.    We will pick up our discussion about the next steps and treatment options after testing.  We will keep her posted as to the test results by phone call and/or MyChart messaging where possible.  We will plan to follow-up in sleep clinic accordingly as well.  I  answered all her questions today and the patient was in agreement.   I encouraged her to call with any interim questions, concerns, problems or updates or email us  through MyChart.  Generally speaking, sleep test authorizations may take up to 2 weeks, sometimes less, sometimes longer, the patient is encouraged to get in touch with us  if they do not hear back from the sleep lab staff directly within the next 2 weeks.  Thank you very much for allowing me to participate in the care of this nice patient. If I can be of any further assistance to you please do not hesitate to talk to me.  Sincerely,   Debbra Fairy, MD, PhD

## 2023-11-30 NOTE — Patient Instructions (Signed)

## 2023-12-01 DIAGNOSIS — N3946 Mixed incontinence: Secondary | ICD-10-CM | POA: Diagnosis not present

## 2023-12-01 DIAGNOSIS — R351 Nocturia: Secondary | ICD-10-CM | POA: Diagnosis not present

## 2023-12-12 ENCOUNTER — Telehealth: Payer: Self-pay | Admitting: Neurology

## 2023-12-12 NOTE — Telephone Encounter (Signed)
 HTA NPSG pending

## 2023-12-14 ENCOUNTER — Other Ambulatory Visit: Payer: Self-pay | Admitting: Gastroenterology

## 2023-12-19 DIAGNOSIS — H2513 Age-related nuclear cataract, bilateral: Secondary | ICD-10-CM | POA: Diagnosis not present

## 2023-12-19 DIAGNOSIS — H16223 Keratoconjunctivitis sicca, not specified as Sjogren's, bilateral: Secondary | ICD-10-CM | POA: Diagnosis not present

## 2023-12-25 NOTE — Telephone Encounter (Signed)
 Called patient to schedule SS she did not pick up. Left voicemail for the patient to call back to schedule. Left my direct number

## 2023-12-25 NOTE — Telephone Encounter (Signed)
 Patient called back.  spoke w/pt she wants to reach out to her insurance to see how much before scheduling

## 2023-12-25 NOTE — Telephone Encounter (Signed)
 NPSG HTA Siegfried Dress: [id number] (exp. 12/12/23 to 03/13/24)

## 2024-01-03 NOTE — Telephone Encounter (Signed)
 NPSG HTA shara: [id number] (exp. 12/12/23 to 03/13/24)   Patietn is scheduled at [facility] for 01/30/24 at 9 pm.  Mailed packet & sent mychart

## 2024-01-30 ENCOUNTER — Ambulatory Visit (INDEPENDENT_AMBULATORY_CARE_PROVIDER_SITE_OTHER): Admitting: Neurology

## 2024-01-30 DIAGNOSIS — G4733 Obstructive sleep apnea (adult) (pediatric): Secondary | ICD-10-CM

## 2024-01-30 DIAGNOSIS — Z9189 Other specified personal risk factors, not elsewhere classified: Secondary | ICD-10-CM

## 2024-01-30 DIAGNOSIS — G4734 Idiopathic sleep related nonobstructive alveolar hypoventilation: Secondary | ICD-10-CM

## 2024-01-30 DIAGNOSIS — E663 Overweight: Secondary | ICD-10-CM

## 2024-01-30 DIAGNOSIS — R0683 Snoring: Secondary | ICD-10-CM

## 2024-01-30 DIAGNOSIS — R519 Headache, unspecified: Secondary | ICD-10-CM

## 2024-01-30 DIAGNOSIS — R351 Nocturia: Secondary | ICD-10-CM

## 2024-01-30 DIAGNOSIS — G4720 Circadian rhythm sleep disorder, unspecified type: Secondary | ICD-10-CM

## 2024-02-04 ENCOUNTER — Other Ambulatory Visit: Payer: Self-pay | Admitting: Emergency Medicine

## 2024-02-04 DIAGNOSIS — M4722 Other spondylosis with radiculopathy, cervical region: Secondary | ICD-10-CM

## 2024-02-05 ENCOUNTER — Ambulatory Visit: Payer: Self-pay | Admitting: Neurology

## 2024-02-05 DIAGNOSIS — G4733 Obstructive sleep apnea (adult) (pediatric): Secondary | ICD-10-CM

## 2024-02-05 NOTE — Procedures (Signed)
 Physician Interpretation:      [facility] POLYSOMNOGRAPHY  INTERPRETATION REPORT   STUDY DATE:  01/30/2024     PATIENT NAME:  [name] [name]         DATE OF BIRTH:  July 26, 1955  PATIENT ID:  [id number]    TYPE OF STUDY:  PSG  READING PHYSICIAN: TRUE MAR, MD, PhD   SCORING TECHNICIAN: Jesusa Haddock, RPSGT     Referred by: Dr. Onetha Epp ? History and Indication for Testing: 68 year old female with an underlying medical history of anemia, aortic regurgitation, diverticulosis, anxiety, hypertension, hyperlipidemia, heart murmur, neuropathy, hepatic steatosis, neck pain, recurrent headaches, history of colon cancer, irritable bowel syndrome, pelvic floor dysfunction, chronic constipation, and low back pain, who reports snoring and excessive daytime somnolence, as well as nocturia and morning headaches. She had an abnormal overnight pulse oximetry test recently. Her Epworth sleepiness score is 2 out of 24, fatigue severity score is 31 out of 63. Height: 63 in Weight: 153 lb (BMI 27) Neck Size: 14 in    MEDICATIONS: Artificial Tear Ointment, Vitamin B 12, Propecia, Gabapentin , Linzess , Zestril , Mobic , Zocor    TECHNICAL DESCRIPTION: A registered sleep technologist was in attendance for the duration of the recording.  Data collection, scoring, video monitoring, and reporting were performed in compliance with the AASM Manual for the Scoring of Sleep and Associated Events; (Hypopnea is scored based on the criteria listed in Section VIII D. 1b in the AASM Manual V2.6 using a 4% oxygen desaturation rule or Hypopnea is scored based on the criteria listed in Section VIII D. 1a in the AASM Manual V2.6 using 3% oxygen desaturation and /or arousal rule).   SLEEP CONTINUITY AND SLEEP ARCHITECTURE:  Lights-out was at 22:14: and lights-on at  05:00:, with a total recording time of 6 hours, 45.5 min. Total sleep time ( TST) was 243.0 minutes with a decreased sleep efficiency  at 59.9%. There was  16.7% REM sleep.  BODY POSITION:  TST was divided  between the following sleep positions: 31.1% supine;  68.9% lateral;  0% prone. Duration of total sleep and percent of total sleep in their respective position is as follows: supine 75 minutes (31%), non-supine 168 minutes (69%); right 43 minutes (18%), left 124 minutes (51%), and prone 00 minutes (0%).  Total supine REM sleep time was 19 minutes (47% of total REM sleep).  Sleep latency was increased at 79.5 minutes.  REM sleep latency was normal at 97.0 minutes. Of the total sleep time, the percentage of stage N1 sleep was 6.8%, stage N2 sleep was 75%, which is markedly increased, stage N3 sleep was 1.9%, which is reduced, and REM sleep was 16.7%, which is mildly reduced. Wake after sleep onset (WASO) time accounted for 83 minutes with minimal to moderate sleep fragmentation noted.   RESPIRATORY MONITORING:   Based on CMS criteria (using a 4% oxygen desaturation rule for scoring hypopneas), there were 0 apneas (0 obstructive; 0 central; 0 mixed), and 56 hypopneas.  Apnea index was 0.0. Hypopnea index was 13.8. The apnea-hypopnea index was 13.8/hour overall (26.2 supine, 36 non-supine; 43.0 REM, 50.5 supine REM).  There were 0 respiratory effort-related arousals (RERAs).  The RERA index was 0 events/h. Total respiratory disturbance index (RDI) was 13.8 events/h. RDI results showed: supine RDI  26.2 /h; non-supine RDI 8.2 /h; REM RDI 43.0 /h, supine REM RDI 50.5 /h.   Based on AASM criteria (using a 3% oxygen desaturation and /or arousal rule for scoring hypopneas), there  were 0 apneas (0 obstructive; 0 central; 0 mixed), and 89 hypopneas. Apnea index was 0.0. Hypopnea index was 22.0. The apnea-hypopnea index was 22.0 overall (36.6 supine, 39 non-supine; 51.9 REM, 66.3 supine REM).  There were 0 respiratory effort-related arousals (RERAs).  The RERA index was 0 events/h. Total respiratory disturbance index (RDI) was 22.0 events/h. RDI  results showed: supine RDI  36.6 /h; non-supine RDI 15.4 /h; REM RDI 51.9 /h, supine REM RDI 66.3 /h.    OXIMETRY: Oxyhemoglobin Saturation Nadir during sleep was at  81% from a mean of 95%.  Of the Total sleep time (TST)   hypoxemia (=<88%) was present for  2.3 minutes, or 0.9% of total sleep time.    LIMB MOVEMENTS: There were 29 periodic limb movements of sleep (7.2/hr), of which 8 (2.0/hr) were associated with an arousal.   AROUSAL: There were 75 arousals in total, for an arousal index of 19 arousals/hour.  Of these, 25 were identified as respiratory-related arousals (6 /h), 8 were PLM-related arousals (2 /h), and 53 were non-specific arousals (13 /h).    EEG: Review of the EEG showed no abnormal electrical discharges and symmetrical bihemispheric findings.      EKG: The EKG revealed normal sinus rhythm (NSR). The average heart rate during sleep was 63 bpm.     AUDIO/VIDEO REVIEW: The audio and video review did not show any abnormal or unusual behaviors, movements, phonations or vocalizations. The patient took 1 restroom break. Snoring was noted, in the mild to moderate range.    POST-STUDY QUESTIONNAIRE: Post study, the patient indicated, that sleep was worse than usual.     IMPRESSION:     1. Obstructive Sleep Apnea (OSA) 2. Dysfunctions associated with sleep stages or arousal from sleep   RECOMMENDATIONS:    1. This study demonstrates overall mild obstructive sleep apnea, much more pronounced during REM sleep and in the supine sleep position with a total AHI of 13.8/h, REM AHI of 43/hour, supine AHI of 26.2/hour and O2 nadir of 81% (during supine REM sleep). Given the patient's medical history and sleep related complaints, treatment with positive airway pressure is recommended; this can be achieved in the form of autoPAP. A full-night CPAP titration study would allow optimization of therapy if needed, down the road. Other treatment options may include avoidance of supine sleep  position along with weight loss (where clinically feasible and applicable), or the use of an oral appliance in selected patients.  Generally speaking, surgical treatment options are not recommended for mild obstructive sleep apnea. Please note, that untreated obstructive sleep apnea may carry additional perioperative morbidity. Patients with significant obstructive sleep apnea should receive perioperative PAP therapy and the surgeons and particularly the anesthesiologist should be informed of the diagnosis and the severity of the sleep disordered breathing. 2. This study shows sleep fragmentation and abnormal sleep stage percentages; these are nonspecific findings and per se do not signify an intrinsic sleep disorder or a cause for the patient's sleep-related symptoms. Causes include (but are not limited to) the first night effect of the sleep study, circadian rhythm disturbances, medication effect or an underlying mood disorder or medical problem.  3. The patient should be cautioned not to drive, work at heights, or operate dangerous or heavy equipment when tired or sleepy. Review and reiteration of good sleep hygiene measures should be pursued with any patient. 4. The patient will be seen in follow-up in the Sleep Clinic at [facility] for discussion of the test results, progress with treatment,  and recheck on symptoms as well as potential further management strategies. The referring provider and the patient will be notified of the test results.    I certify that I have reviewed the entire raw data recording prior to the issuance of this report in accordance with the Standards of Accreditation of the American Academy of Sleep Medicine (AASM).  True Mar, MD, PhD Medical Director, [facility]) Diplomat, ABPN (Neurology and Sleep)              Technical Report:    [facility] PSG Summary    General Information   Name: [name] [name], [name] [name] BMI: 27.10 Physician: TRUE MAR, MD  ID: [id number] Height: 63.0 in Technician: Jesusa Haddock, RPSGT  Sex: Female Weight: 153.0 lb Record: [id number]  Age: 68 [12-Nov-1955] Date: 01/30/2024    Medical & Medication History    68 year old female with an underlying medical history of anemia, aortic regurgitation, diverticulosis, anxiety, hypertension, hyperlipidemia, heart murmur, neuropathy, hepatic steatosis, neck pain, recurrent headaches, history of colon cancer, irritable bowel syndrome, pelvic floor dysfunction, chronic constipation, and low back pain, who reports snoring and excessive daytime somnolence, as well as nocturia and morning headaches. Her headaches in AM are usually bifrontal or behind the eyes, dull and achy, not as severe as a migraine. She also has chronic neck pain which actually improved after she started gabapentin , which is currently 600 mg at bedtime. Her husband has never mentioned any apneas to her. At times she has woken up with a sense of not breathing well or gasping even. I reviewed the ONO report from 10/11/2023. Average oxygen saturation 91.2%, nadir was 79% with time below or at 88% saturation of 5 minutes. Artificial Tear Ointment, Vitamin B 12, Propecia, Gabapentin , Linzess , Zestril , Mobic , Zocor    Sleep Disorder      Comments   The patient came into the sleep lab for a PSG. The patient took Gabapentin  prior to start of study. One restroom break. EKG did not show any obvious cardiac arrhythmias. Mild to moderate snoring. All sleep stages witnessed. Respiratory events scored with a 4% desat. Respiratory events and snoring worse while supine. The patient slept supine and lateral. PLM's some with arousals witnessed. AHI was 9.9 after 2hrs of TST.     Lights out: 10:14:15 PM Lights on: 05:00:04 AM   Time Total Supine Side Prone Upright  Recording (TRT) 6h 45.84m 1h 41.37m 5h 4.77m 0h 0.86m 0h 0.86m  Sleep (TST) 4h 3.40m 1h 15.31m 2h 47.71m 0h 0.32m 0h  0.71m   Latency N1 N2 N3 REM Onset Per. Slp. Eff.  Actual 0h 0.40m 0h 2.784m 1h 2.49m 1h 37.67m 1h 19.68m 1h 55.28m 59.93%   Stg Dur Wake N1 N2 N3 REM  Total 162.5 16.5 181.5 4.5 40.5  Supine 25.5 2.5 54.0 0.0 19.0  Side 137.0 14.0 127.5 4.5 21.5  Prone 0.0 0.0 0.0 0.0 0.0  Upright 0.0 0.0 0.0 0.0 0.0   Stg % Wake N1 N2 N3 REM  Total 40.1 6.8 74.7 1.9 16.7  Supine 6.3 1.0 22.2 0.0 7.8  Side 33.8 5.8 52.5 1.9 8.8  Prone 0.0 0.0 0.0 0.0 0.0  Upright 0.0 0.0 0.0 0.0 0.0     Apnea Summary Sub Supine Side Prone Upright  Total 0 Total 0 0 0 0 0    REM 0 0 0 0 0    NREM 0 0 0 0 0  Obs 0 REM 0 0 0 0 0  NREM 0 0 0 0 0  Mix 0 REM 0 0 0 0 0    NREM 0 0 0 0 0  Cen 0 REM 0 0 0 0 0    NREM 0 0 0 0 0   Rera Summary Sub Supine Side Prone Upright  Total 0 Total 0 0 0 0 0    REM 0 0 0 0 0    NREM 0 0 0 0 0   Hypopnea Summary Sub Supine Side Prone Upright  Total 89 Total 89 46 43 0 0    REM 35 21 14 0 0    NREM 54 25 29 0 0   4% Hypopnea Summary Sub Supine Side Prone Upright  Total (4%) 56 Total 56 33 23 0 0    REM 29 16 13  0 0    NREM 27 17 10  0 0     AHI Total Obs Mix Cen  21.98 Apnea 0.00 0.00 0.00 0.00   Hypopnea 21.98 -- -- --  13.83 Hypopnea (4%) 13.83 -- -- --    Total Supine Side Prone Upright  Position AHI 21.98 36.56 15.40 0.00 0.00  REM AHI 51.85   NREM AHI 16.00   Position RDI 21.98 36.56 15.40 0.00 0.00  REM RDI 51.85   NREM RDI 16.00    4% Hypopnea Total Supine Side Prone Upright  Position AHI (4%) 13.83 26.23 8.24 0.00 0.00  REM AHI (4%) 42.96   NREM AHI (4%) 8.00   Position RDI (4%) 13.83 26.23 8.24 0.00 0.00  REM RDI (4%) 42.96   NREM RDI (4%) 8.00    Desaturation Information Threshold: 2% <100% <90% <80% <70% <60% <50% <40%  Supine 71.0 5.0 0.0 0.0 0.0 0.0 0.0  Side 157.0 6.0 0.0 0.0 0.0 0.0 0.0  Prone 0.0 0.0 0.0 0.0 0.0 0.0 0.0  Upright 0.0 0.0 0.0 0.0 0.0 0.0 0.0  Total 228.0 11.0 0.0 0.0 0.0 0.0 0.0  Index 42.0 2.0 0.0 0.0 0.0 0.0 0.0    Threshold: 3% <100% <90% <80% <70% <60% <50% <40%  Supine 47.0 5.0 0.0 0.0 0.0 0.0 0.0  Side 56.0 6.0 0.0 0.0 0.0 0.0 0.0  Prone 0.0 0.0 0.0 0.0 0.0 0.0 0.0  Upright 0.0 0.0 0.0 0.0 0.0 0.0 0.0  Total 103.0 11.0 0.0 0.0 0.0 0.0 0.0  Index 19.0 2.0 0.0 0.0 0.0 0.0 0.0   Threshold: 4% <100% <90% <80% <70% <60% <50% <40%  Supine 34.0 5.0 0.0 0.0 0.0 0.0 0.0  Side 30.0 6.0 0.0 0.0 0.0 0.0 0.0  Prone 0.0 0.0 0.0 0.0 0.0 0.0 0.0  Upright 0.0 0.0 0.0 0.0 0.0 0.0 0.0  Total 64.0 11.0 0.0 0.0 0.0 0.0 0.0  Index 11.8 2.0 0.0 0.0 0.0 0.0 0.0   Threshold: 3% <100% <90% <80% <70% <60% <50% <40%  Supine 47 5 0 0 0 0 0  Side 56 6 0 0 0 0 0  Prone 0 0 0 0 0 0 0  Upright 0 0 0 0 0 0 0  Total 103 11 0 0 0 0 0   Awakening/Arousal Information # of Awakenings 20  Wake after sleep onset 83.82m  Wake after persistent sleep 61.59m   Arousal Assoc. Arousals Index  Apneas 0 0.0  Hypopneas 25 6.2  Leg Movements 11 2.7  Snore 0 0.0  PTT Arousals 0 0.0  Spontaneous 53 13.1  Total 89 22.0  Leg Movement Information PLMS LMs Index  Total LMs during PLMS  29 7.2  LMs w/ Microarousals 8 2.0   LM LMs Index  w/ Microarousal 3 0.7  w/ Awakening 2 0.5  w/ Resp Event 1 0.2  Spontaneous 30 7.4  Total 34 8.4     Desaturation threshold setting: 3% Minimum desaturation setting: 10 seconds SaO2 nadir: 81% The longest event was a 73 sec obstructive Hypopnea with a minimum SaO2 of 89%. The lowest SaO2 was 81% associated with a 42 sec obstructive Hypopnea. EKG Rates EKG Avg Max Min  Awake 68 99 54  Asleep 63 77 52  EKG Events: N/A

## 2024-02-07 NOTE — Telephone Encounter (Signed)
 I called pt and relayed results of sleep study OSA mild. Recommend treatment with autopap to see if feels better.  Send order to DME [facility]. They will contact with insurance auth.  When gets machine use 4 hr or more for insurance compliance nightly.  We see back in 2-3 months for compliance appt. DME will go over machine, use, supplies, mask fit.  She verbalized understanding.   Pt will call in a week if not heard back from them.

## 2024-02-07 NOTE — Telephone Encounter (Signed)
-----   Message from True Mar sent at 02/05/2024  4:25 PM EDT ----- Patient referred by Dr. Ines, seen by me on 11/30/23, diagnostic PSG on 01/30/24.    Please call and notify the patient that the recent sleep study did confirm the diagnosis of obstructive sleep apnea. OSA is overall mild, but worth treating to see if she feels better after  treatment. To that end I recommend treatment for this in the form of autoPAP, which means, that we don't have to bring her back for a second sleep study with CPAP, but will let him try an autoPAP  machine at home, through a DME company (of her choice, or as per insurance requirement). The DME representative will educate her on how to use the machine, how to put the mask on, etc. I have placed  an order in the chart. Please send referral, talk to patient, send report to referring MD. We will need a FU in sleep clinic for 10 weeks post-PAP set up, please arrange that with me or one of our  NPs. Thanks,   True Mar, MD, PhD [facility])   ----- Message ----- From: Mar True, MD Sent: 02/05/2024   4:24 PM EDT To: True Mar, MD

## 2024-02-26 DIAGNOSIS — M6289 Other specified disorders of muscle: Secondary | ICD-10-CM | POA: Diagnosis not present

## 2024-02-26 DIAGNOSIS — K5900 Constipation, unspecified: Secondary | ICD-10-CM | POA: Diagnosis not present

## 2024-02-26 DIAGNOSIS — M6281 Muscle weakness (generalized): Secondary | ICD-10-CM | POA: Diagnosis not present

## 2024-02-26 DIAGNOSIS — R150 Incomplete defecation: Secondary | ICD-10-CM | POA: Diagnosis not present

## 2024-02-26 DIAGNOSIS — N3946 Mixed incontinence: Secondary | ICD-10-CM | POA: Diagnosis not present

## 2024-03-04 DIAGNOSIS — R351 Nocturia: Secondary | ICD-10-CM | POA: Diagnosis not present

## 2024-03-04 DIAGNOSIS — N3946 Mixed incontinence: Secondary | ICD-10-CM | POA: Diagnosis not present

## 2024-03-18 DIAGNOSIS — N3946 Mixed incontinence: Secondary | ICD-10-CM | POA: Diagnosis not present

## 2024-03-27 ENCOUNTER — Encounter: Payer: Self-pay | Admitting: Cardiology

## 2024-03-27 ENCOUNTER — Ambulatory Visit: Attending: Cardiology | Admitting: Cardiology

## 2024-03-27 VITALS — BP 133/78 | HR 99 | Resp 16 | Ht 63.0 in | Wt 152.2 lb

## 2024-03-27 DIAGNOSIS — I359 Nonrheumatic aortic valve disorder, unspecified: Secondary | ICD-10-CM

## 2024-03-27 DIAGNOSIS — I2510 Atherosclerotic heart disease of native coronary artery without angina pectoris: Secondary | ICD-10-CM

## 2024-03-27 DIAGNOSIS — I7121 Aneurysm of the ascending aorta, without rupture: Secondary | ICD-10-CM | POA: Diagnosis not present

## 2024-03-27 DIAGNOSIS — E782 Mixed hyperlipidemia: Secondary | ICD-10-CM | POA: Diagnosis not present

## 2024-03-27 DIAGNOSIS — I10 Essential (primary) hypertension: Secondary | ICD-10-CM

## 2024-03-27 DIAGNOSIS — I351 Nonrheumatic aortic (valve) insufficiency: Secondary | ICD-10-CM

## 2024-03-27 DIAGNOSIS — I700 Atherosclerosis of aorta: Secondary | ICD-10-CM

## 2024-03-27 MED ORDER — METOPROLOL SUCCINATE ER 25 MG PO TB24: 25.0000 mg | ORAL_TABLET | Freq: Every morning | ORAL | 3 refills | Status: AC

## 2024-03-27 NOTE — Progress Notes (Signed)
 Cardiology Office Note:  .   ID:  [name] [name], DOB 14-Oct-1955, MRN [id number] PCP:  Purcell Emil Schanz, MD  Former Cardiology Providers: Monetta Redell PARAS, MD, Revankar, Jennifer SAUNDERS, MD  [facility] Providers Cardiologist:  [name] Large, DO , [facility] (established care 03/27/24) Electrophysiologist:  None  Click to update primary MD,subspecialty MD or APP then REFRESH:1}    Chief Complaint  Patient presents with   Aneurysm of ascending aorta without rupture    New Patient (Initial Visit)    History of Present Illness: .   [name] [name] is a 68 y.o. Caucasian female whose past medical history and cardiovascular risk factors includes: Hypertension, hyperlipidemia, aortic valve disease, ascending thoracic aortic aneurysm,Coronary artery calcification, Aortic atherosclerosis.  Formally under the care of Dr. Edwyna who last saw [name] [name] back in November 2024. I am seeing her for the first time to re-establishing care.   Patient has had a history of aortic aneurysm for approximately 10 years.  Home blood pressures are usually around 130 mmHg.  Her last 2 CT studies have been noted ascending aneurysm to be around 46 mm.  She denies anginal chest pain or heart failure symptoms.  Patient states that she regularly monitors her blood pressure.  Spironolactone was discontinued due to hyperkalemia.  She has not been on losartan.  No history of urinary tract infection is not on as needed antibiotics.  She leads a sedentary lifestyle because of orthopedic issues and the fact that she has bilateral hip replacement.   Review of Systems: .   Review of Systems  Cardiovascular:  Negative for chest pain, claudication, irregular heartbeat, leg swelling, near-syncope, orthopnea, palpitations, paroxysmal nocturnal dyspnea and syncope.  Respiratory:  Negative for shortness of breath.   Hematologic/Lymphatic: Negative for bleeding problem.    Studies Reviewed:   EKG: EKG  Interpretation Date/Time:  Wednesday March 27 2024 10:00:15 EDT Ventricular Rate:  95 PR Interval:  176 QRS Duration:  74 QT Interval:  372 QTC Calculation: 467 R Axis:   41  Text Interpretation: Normal sinus rhythm Normal ECG When compared with ECG of 01-Jun-2023 10:12, No significant change since last tracing Confirmed by [name] [name] [phone]) on 03/27/2024 10:27:15 AM  Echocardiogram: 11/16/2021 LVEF: 60-65%, LVIDd 4.2 cm Diastolic Function: Grade 1 Right ventricular size and function normal Regurgitation: Mild MR, moderate to severe AR Aorta: Ascending aorta 44 mm Estimated RAP 3 See report for additional details  RADIOLOGY: CT ANGIO CHEST AORTA W/CM & OR WO/CM 10/2022 Stable appearing ascending thoracic aortic aneurysm measuring up to 4.6 cm. Ascending thoracic aortic aneurysm. Recommend semi-annual imaging followup by CTA or MRA and referral to cardiothoracic surgery if not already obtained. This recommendation follows 2010 ACCF/AHA/AATS/ACR/ASA/SCA/SCAI/SIR/STS/SVM Guidelines for the Diagnosis and Management of Patients With Thoracic Aortic Disease. Circulation. 2010; 121: Z733-z630. Aortic aneurysm NOS (ICD10-I71.9)   11 mm right thyroid  nodule which appears stable in nature. Not clinically significant; no follow-up imaging recommended (ref: J Am Coll Radiol. 2015 Feb;12(2): 143-50).   Fatty infiltration of the liver is noted.   Aortic Atherosclerosis (ICD10-I70.0).  CT Chest w/o contrast:  10/14/2023 1. Stable 4.6 cm ascending aortic aneurysm. Recommend annual imaging follow-up by CTA or MRA. 2. No acute cardiopulmonary process. 3. Aortic atherosclerosis.  Risk Assessment/Calculations:   NA   Labs:       Latest Ref Rng & Units 10/24/2023   11:30 AM 04/03/2023    9:31 AM 09/13/2022    3:33 PM  CBC  WBC 4.0 -  10.5 K/uL 6.1  7.0  9.7   Hemoglobin 12.0 - 15.0 g/dL 87.7  88.1  86.8   Hematocrit 36.0 - 46.0 % 36.1  35.5  38.3   Platelets 150.0 - 400.0 K/uL 158.0   201.0  194.0        Latest Ref Rng & Units 10/24/2023   11:30 AM 06/27/2023   11:09 AM 06/12/2023    2:27 PM  BMP  Glucose 70 - 99 mg/dL 854  875  899   BUN 6 - 23 mg/dL 11  16  22    Creatinine 0.40 - 1.20 mg/dL 9.28  9.16  8.95   Sodium 135 - 145 mEq/L 138  136  132   Potassium 3.5 - 5.1 mEq/L 4.5  4.9  5.2 No hemolysis seen   Chloride 96 - 112 mEq/L 103  102  99   CO2 19 - 32 mEq/L 27  27  25    Calcium 8.4 - 10.5 mg/dL 9.2  9.0  9.4       Latest Ref Rng & Units 10/24/2023   11:30 AM 06/27/2023   11:09 AM 06/12/2023    2:27 PM  CMP  Glucose 70 - 99 mg/dL 854  875  899   BUN 6 - 23 mg/dL 11  16  22    Creatinine 0.40 - 1.20 mg/dL 9.28  9.16  8.95   Sodium 135 - 145 mEq/L 138  136  132   Potassium 3.5 - 5.1 mEq/L 4.5  4.9  5.2 No hemolysis seen   Chloride 96 - 112 mEq/L 103  102  99   CO2 19 - 32 mEq/L 27  27  25    Calcium 8.4 - 10.5 mg/dL 9.2  9.0  9.4   Total Protein 6.0 - 8.3 g/dL 6.7     Total Bilirubin 0.2 - 1.2 mg/dL 0.8     Alkaline Phos 39 - 117 U/L 50     AST 0 - 37 U/L 21     ALT 0 - 35 U/L 20       Lab Results  Component Value Date   CHOL 156 10/24/2023   HDL 73.40 10/24/2023   LDLCALC 62 10/24/2023   TRIG 100.0 10/24/2023   CHOLHDL 2 10/24/2023   No results for input(s): LIPOA in the last 8760 hours. No components found for: NTPROBNP No results for input(s): PROBNP in the last 8760 hours. Recent Labs    04/03/23 0931  TSH 1.85    Physical Exam:    Today's Vitals   03/27/24 0957  BP: 133/78  Pulse: 99  Resp: 16  SpO2: 96%  Weight: 152 lb 3.2 oz (69 kg)  Height: 5' 3 (1.6 m)   Body mass index is 26.96 kg/m. Wt Readings from Last 3 Encounters:  03/27/24 152 lb 3.2 oz (69 kg)  11/30/23 153 lb (69.4 kg)  10/24/23 159 lb (72.1 kg)    Physical Exam  Constitutional: No distress.  hemodynamically stable  Neck: No JVD present.  Cardiovascular: Normal rate, regular rhythm, S1 normal and S2 normal. Exam reveals no gallop, no S3 and no S4.   Murmur heard. High-pitched decrescendo early diastolic murmur is present with a grade of 3/4 radiating to the apex. Pulmonary/Chest: Effort normal and breath sounds normal. No stridor. She has no wheezes. She has no rales.  Musculoskeletal:        General: No edema.     Cervical back: Neck supple.  Skin: Skin is warm.   Impression &  Recommendation(s):  Impression:   ICD-10-CM   1. Aneurysm of ascending aorta without rupture (HCC)  I71.21 EKG 12-Lead    metoprolol  succinate (TOPROL  XL) 25 MG 24 hr tablet    CT CHEST WO CONTRAST    ECHOCARDIOGRAM COMPLETE    2. Aortic valve disease  I35.9 ECHOCARDIOGRAM COMPLETE    3. Coronary artery calcification seen on computed tomography  I25.10     4. Atherosclerosis of aorta (HCC)  I70.0     5. Essential hypertension  I10     6. Mixed hyperlipidemia  E78.2        Recommendation(s):  Aneurysm of ascending aorta without rupture (HCC) Known history of aortic aneurysm for approximately 10 years. Last CT scan April 2025 noted ascending aorta at 46 mm Currently on lisinopril  40 mg p.o. daily Start low-dose Toprol -XL 25 mg p.o. every morning Patient recommended to avoid medications such as ciprofloxacin or other associated antibiotics and similar drug class given the ascending aneurysm.   Recommend a goal SBP of 120 mmHg if able to tolerate. Patient is educated on the importance of seeking medical attention by going to the closest ER via EMS if she has symptoms consistent with aortic syndromes.   It is  best to avoid activities that cause grunting or straining (medically referred to as a "valsalva maneuver"). This happens when a person bears down against a closed throat to increase the strength of arm or abdominal muscles. There's often a tendency to do this when lifting heavy weights, doing sit-ups, push-ups or chin-ups, etc., but it may be harmful.   Aortic valve disease Last echocardiogram from May 2023 notes moderate to severe aortic  regurgitation Plan echo prior to the next office visit to reevaluate disease progression  Coronary artery calcification seen on computed tomography Atherosclerosis of aorta (HCC) Denies anginal chest pain. Reemphasize importance of secondary prevention Continue simvastatin  40 mg p.o. daily  Essential hypertension Office blood pressures are acceptable. Home blood pressures are also well-controlled, per patient. Medications reconciled  Mixed hyperlipidemia Continue simvastatin  40 mg p.o. nightly. Cardiology following peripherally, managed by primary care provider.  Orders Placed:  Orders Placed This Encounter  Procedures   CT CHEST WO CONTRAST    Standing Status:   Future    Expected Date:   05/13/2024    Expiration Date:   03/27/2025    Scheduling Instructions:     Due Nov 2025    [facility]   EKG 12-Lead   ECHOCARDIOGRAM COMPLETE    Standing Status:   Future    Expected Date:   09/09/2024    Expiration Date:   03/27/2025    Scheduling Instructions:     Due March 2026    Where should this test be performed:   [facility]    Does the patient weigh less than or greater than 250 lbs?:   Patient weighs less than 250 lbs    Perflutren DEFINITY (image enhancing agent) should be administered unless hypersensitivity or allergy exist:   Administer Perflutren    Reason for exam-Echo:   Other-Full Diagnosis List    Full ICD-10/Reason for Exam:   Aortic regurgitation [809152]     Final Medication List:    Meds ordered this encounter  Medications   metoprolol  succinate (TOPROL  XL) 25 MG 24 hr tablet    Sig: Take 1 tablet (25 mg total) by mouth every morning. Hold for BP <100 or Pulse <55  Dispense:  90 tablet    Refill:  3    There are no discontinued medications.   Current Outpatient Medications:    Artificial Tear Ointment (DRY EYES OP), Apply 1 drop to eye as needed (dry eyes)., Disp: , Rfl:    Cyanocobalamin  (B-12 PO),  Take by mouth., Disp: , Rfl:    finasteride (PROPECIA) 1 MG tablet, Take 1 mg by mouth daily., Disp: , Rfl:    Gabapentin , Once-Daily, 600 MG TABS, Take 1 tablet (600 mg total) by mouth at bedtime., Disp: 90 tablet, Rfl: 4   LINZESS  290 MCG CAPS capsule, TAKE 1 CAPSULE BY MOUTH DAILY BEFORE BREAKFAST., Disp: 90 capsule, Rfl: 3   lisinopril  (ZESTRIL ) 40 MG tablet, TAKE 1 TABLET BY MOUTH EVERY DAY, Disp: 90 tablet, Rfl: 3   meloxicam  (MOBIC ) 15 MG tablet, TAKE 1 TABLET (15 MG TOTAL) BY MOUTH DAILY., Disp: 90 tablet, Rfl: 3   metoprolol  succinate (TOPROL  XL) 25 MG 24 hr tablet, Take 1 tablet (25 mg total) by mouth every morning. Hold for BP <100 or Pulse <55, Disp: 90 tablet, Rfl: 3   simvastatin  (ZOCOR ) 40 MG tablet, Take 1 tablet (40 mg total) by mouth daily at 6 PM., Disp: 90 tablet, Rfl: 3  Current Facility-Administered Medications:    0.9 %  sodium chloride  infusion, 500 mL, Intravenous, Once, Nandigam, Kavitha V, MD  Consent:   NA  Disposition:   6 month  Her questions and concerns were addressed to her satisfaction. She voices understanding of the recommendations provided during this encounter.    Signed, [name] Michele HAS, [facility]  A Division of [facility] [address] [place], [place] [address]

## 2024-03-27 NOTE — Patient Instructions (Addendum)
 Medication Instructions:  Your physician has recommended you make the following change in your medication:  START: metoprolol  succinate (Toprol  XL) 25 mg by mouth once daily in the morning   Hold for Blood pressure less than 100 or Heart rate (pulse) less than 55  *If you need a refill on your cardiac medications before your next appointment, please call your pharmacy*  Lab Work: NONE  If you have labs (blood work) drawn today and your tests are completely normal, you will receive your results only by: MyChart Message (if you have MyChart) OR A paper copy in the mail If you have any lab test that is abnormal or we need to change your treatment, we will call you to review the results.  Testing/Procedures: NOV 2025--- Your physician has requested that you have a CT Chest.   [facility] 2026-- - Your physician has requested that you have an echocardiogram. Echocardiography is a painless test that uses sound waves to create images of your heart. It provides your doctor with information about the size and shape of your heart and how well your heart's chambers and valves are working. This procedure takes approximately one hour. There are no restrictions for this procedure. Please do NOT wear cologne, perfume, aftershave, or lotions (deodorant is allowed). Please arrive 15 minutes prior to your appointment time.  Please note: We ask at that you not bring children with you during ultrasound (echo/ vascular) testing. Due to room size and safety concerns, children are not allowed in the ultrasound rooms during exams. Our front office staff cannot provide observation of children in our lobby area while testing is being conducted. An adult accompanying a patient to their appointment will only be allowed in the ultrasound room at the discretion of the ultrasound technician under special circumstances. We apologize for any inconvenience.   Follow-Up: At [facility], you and your health needs are our  priority.  As part of our continuing mission to provide you with exceptional heart care, our providers are all part of one team.  This team includes your primary Cardiologist (physician) and Advanced Practice Providers or APPs (Physician Assistants and Nurse Practitioners) who all work together to provide you with the care you need, when you need it.  Your next appointment:   6-7 month(s) please complete echo before office visit   Provider:   Madonna Large, DO

## 2024-03-30 ENCOUNTER — Encounter: Payer: Self-pay | Admitting: Cardiology

## 2024-04-02 ENCOUNTER — Encounter: Payer: Self-pay | Admitting: Gastroenterology

## 2024-04-02 ENCOUNTER — Telehealth: Payer: Self-pay | Admitting: *Deleted

## 2024-04-02 ENCOUNTER — Ambulatory Visit: Admitting: Gastroenterology

## 2024-04-02 VITALS — BP 126/68 | HR 84 | Ht 63.0 in | Wt 154.0 lb

## 2024-04-02 DIAGNOSIS — R109 Unspecified abdominal pain: Secondary | ICD-10-CM | POA: Diagnosis not present

## 2024-04-02 DIAGNOSIS — K219 Gastro-esophageal reflux disease without esophagitis: Secondary | ICD-10-CM

## 2024-04-02 DIAGNOSIS — K582 Mixed irritable bowel syndrome: Secondary | ICD-10-CM

## 2024-04-02 DIAGNOSIS — I7121 Aneurysm of the ascending aorta, without rupture: Secondary | ICD-10-CM | POA: Diagnosis not present

## 2024-04-02 DIAGNOSIS — K5904 Chronic idiopathic constipation: Secondary | ICD-10-CM

## 2024-04-02 DIAGNOSIS — G8929 Other chronic pain: Secondary | ICD-10-CM

## 2024-04-02 DIAGNOSIS — K449 Diaphragmatic hernia without obstruction or gangrene: Secondary | ICD-10-CM

## 2024-04-02 MED ORDER — PANTOPRAZOLE SODIUM 20 MG PO TBEC: 20.0000 mg | DELAYED_RELEASE_TABLET | Freq: Every day | ORAL | 3 refills | Status: AC

## 2024-04-02 NOTE — Telephone Encounter (Signed)
 Hey Dr. Michele! You recently saw this patient in clinic last week. Are you able to comment on surgical clearance for upcoming endoscopy scheduled for 05/20/2024? Please route your response to P CV DIV PREOP. Thank you!   ~Hjalmer Iovino

## 2024-04-02 NOTE — Telephone Encounter (Signed)
 [facility] Pre-operative Risk Assessment     Request for surgical clearance:     Endoscopy Procedure  What type of surgery is being performed?     Endoscopy  When is this surgery scheduled?     05/20/2024  What type of clearance is required ?   CARDIAC  Are there any medications that need to be held prior to surgery and how long? NO  Practice name and name of physician performing surgery?      [facility]  What is your office phone and fax number?      Phone- [phone]  Fax- [phone]  Anesthesia type (None, local, MAC, general) ?       MAC   Please route your response to [employer]

## 2024-04-02 NOTE — Patient Instructions (Addendum)
 VISIT SUMMARY:  During your visit, we discussed your bowel irregularities, abdominal pain, and medication management. We also reviewed your vascular history and cardiovascular management.  YOUR PLAN:  CHRONIC CONSTIPATION WITH ALTERNATING DIARRHEA: You have chronic constipation with episodes of diarrhea, likely due to the effects of Linzess  and Imodium. -Try Linzess  145 mcg daily after breakfast or lunch. We provided samples for you. -Adjust Benefiber intake to 2 teaspoons twice daily, or 1 teaspoon with each meal. -Avoid using Imodium if possible, but you can use it for social situations if necessary. -Monitor your response to the lower dose of Linzess  and we will adjust your prescription if it is effective.  ABDOMINAL PAIN: Your abdominal pain is likely related to constipation and your hiatal hernia. -Manage constipation and reflux as per the above plans to help alleviate your abdominal pain.  HIATAL HERNIA WITH REFLUX SYMPTOMS: You have a hiatal hernia with associated reflux symptoms, which are worsened by constipation. -Take pantoprazole  20 mg as needed for reflux symptoms. -We will schedule an upper endoscopy to evaluate for esophagitis or other issues. This requires cardiac clearance due to your 4.6 cm ascending aortic aneurysm. -Your upper endoscopy is scheduled for Monday, November 10th at 9:00 AM.  AORTIC ANEURYSM: Your aortic aneurysm is being monitored and currently measures 4.6 cm. -Ensure your blood pressure is maintained around 120/80 mmHg. -We will coordinate with your cardiologist for ongoing management.  You have been scheduled for an endoscopy. Please follow written instructions given to you at your visit today.  If you use inhalers (even only as needed), please bring them with you on the day of your procedure.  If you take any of the following medications, they will need to be adjusted prior to your procedure:   DO NOT TAKE 7 DAYS PRIOR TO TEST- Trulicity  (dulaglutide) Ozempic, Wegovy (semaglutide) Mounjaro (tirzepatide) Bydureon Bcise (exanatide extended release)  DO NOT TAKE 1 DAY PRIOR TO YOUR TEST Rybelsus (semaglutide) Adlyxin (lixisenatide) Victoza (liraglutide) Byetta (exanatide) ___________________________________________________________________________    Due to recent changes in healthcare laws, you may see the results of your imaging and laboratory studies on MyChart before your provider has had a chance to review them.  We understand that in some cases there may be results that are confusing or concerning to you. Not all laboratory results come back in the same time frame and the provider may be waiting for multiple results in order to interpret others.  Please give us  48 hours in order for your provider to thoroughly review all the results before contacting the office for clarification of your results.    I appreciate the  opportunity to care for you  Thank You   Kavitha Nandigam , MD

## 2024-04-02 NOTE — Telephone Encounter (Signed)
 Acceptable risk for upcoming endoscopy.   [name] [name] [place], DO, FACC

## 2024-04-02 NOTE — Progress Notes (Signed)
 [name] [name]    [id number]    07/15/1955  Primary Care Physician:Sagardia, Emil Schanz, MD  Referring Physician: Purcell Emil Schanz, MD [address] [place],  [place] [address]   Chief complaint: IBS constipation and diarrhea  Discussed the use of AI scribe software for clinical note transcription with the patient, who gave verbal consent to proceed.  History of Present Illness [name] [name] is a 68 year old female who presents with bowel irregularities and medication management.  Bowel irregularity and medication effects - Difficulty maintaining a regular bowel schedule due to effects of Linzess  and Imodium - Linzess  290 mcg daily causes variable bowel movements, with some days resulting in multiple bowel movements and other days only one - Imodium used approximately once a week to manage diarrhea, leading to subsequent constipation - Episodes of severe constipation require use of Dulcolax, most recently used two nights ago, resulting in multiple bowel movements and an episode of incontinence - Experiences urgency and difficulty relaxing during bowel movements, often feeling 'clogged up'  Constipation management strategies - Has tried physical therapy, which provided some techniques but did not resolve symptoms - Uses a step stool, stomach rubbing, and breathing exercises to aid defecation - Takes Benefiber, two teaspoons daily, usually all at once, and is open to adjusting timing to improve bowel regularity  Abdominal and pelvic symptoms - Hernia causes pain, particularly when constipated  Vascular history and monitoring - Aortic aneurysm currently measuring 4.6 cm, under cardiology care and monitoring - No upper endoscopy in the past 20 years - Colonoscopy performed in 2023  Cardiovascular management - Blood pressure managed with lisinopril , with adjustments being considered to maintain blood pressure around 120 mmHg  Other symptoms - Stable weight -  Swelling in feet  GI Hx:   Colonoscopy 05/12/22 - The perianal and digital rectal examinations were normal. - A 3 mm polyp was found in the ascending colon. The polyp was sessile. The polyp was removed with a cold snare. Resection and retrieval were complete. - Scattered small-mouthed diverticula were found in the sigmoid colon. - There was evidence of a prior end-to-end colo-colonic anastomosis in the recto-sigmoid colon. This was patent and was characterized by healthy appearing mucosa. - Non-bleeding external and internal hemorrhoids were found during retroflexion. The hemorrhoids were small   US  abdomen RUQ 05/12/21 IMPRESSION: ULTRASOUND RUQ:   Hepatic steatosis.  No focal liver lesion.   Colonoscopy April 25, 2019: - Mild diverticulosis in the descending colon. - Non-bleeding internal hemorrhoids. - Patent functional end-to-end colo-colonic anastomosis, characterized by healthy appearing mucosa. - No specimens   Colonoscopy March 29, 2018: 3 polyps (tubular adenoma and sessile serrated polyp) 5 to 11 mm in size removed from descending and transverse colon with cold snare.  2 polyps (tubular adenoma ) less than 5 mm in size removed with forceps.  Left-sided diverticulosis.  Functional colocolonic anastomosis.   Anorectal manometry July 17, 2017 showed weak anal sphincter and dyssynergic defecation She subsequently did pelvic floor physical therapy with no significant improvement of symptoms      Outpatient Encounter Medications as of 04/02/2024  Medication Sig   Artificial Tear Ointment (DRY EYES OP) Apply 1 drop to eye as needed (dry eyes).   Cyanocobalamin  (B-12 PO) Take by mouth.   finasteride (PROPECIA) 1 MG tablet Take 1 mg by mouth daily.   Gabapentin , Once-Daily, 600 MG TABS Take 1 tablet (600 mg total) by mouth at bedtime.  LINZESS  290 MCG CAPS capsule TAKE 1 CAPSULE BY MOUTH DAILY BEFORE BREAKFAST.   lisinopril  (ZESTRIL ) 40 MG tablet TAKE 1 TABLET BY MOUTH EVERY  DAY   meloxicam  (MOBIC ) 15 MG tablet TAKE 1 TABLET (15 MG TOTAL) BY MOUTH DAILY.   metoprolol  succinate (TOPROL  XL) 25 MG 24 hr tablet Take 1 tablet (25 mg total) by mouth every morning. Hold for BP <100 or Pulse <55   simvastatin  (ZOCOR ) 40 MG tablet Take 1 tablet (40 mg total) by mouth daily at 6 PM.   Facility-Administered Encounter Medications as of 04/02/2024  Medication   0.9 %  sodium chloride  infusion    Allergies as of 04/02/2024   (No Known Allergies)    Past Medical History:  Diagnosis Date   Abnormal echocardiogram 05/08/2019   Anemia    hx of, not at this time   ANXIETY DISORDER- mixed disorder w/ depressive symptoms 02/07/2014   Aortic regurgitation    Arthritis    right hip; left hip; most of my joints (05/27/2014)   Atherosclerosis of aorta 03/08/2021   Blood transfusion without reported diagnosis    Phreesia 09/04/2020   Cancer (HCC)    [facility]reesia 09/04/2020   COLONIC POLYPS, ADENOMATOUS 02/20/2003   Qualifier: Diagnosis of   By: Myriam RN, Carissa         Depression    Diverticulosis    DIVERTICULOSIS, COLON 02/20/2003   Qualifier: Diagnosis of   By: Myriam RN, Carissa      Replacing diagnoses that were inactivated after the 10/10/22 regulatory import     Dyslipidemia 03/08/2021   Elevated cholesterol 02/14/2018   Enlarged thoracic aorta 04/17/2019   Essential hypertension 10/13/2007   Qualifier: Diagnosis of   By: Myriam RN, Maudie         Essential hypertension, benign    takes Lisinopril -HCTZ daily   Heart murmur    HERNIA, UMBILICAL 10/13/2007   Qualifier: Diagnosis of   By: Myriam RN, Carissa      Replacing diagnoses that were inactivated after the 10/10/22 regulatory import     History of blood transfusion    no abnormal reaction noted 2015   History of colonic polyps 05/21/2021   History of malignant neoplasm of large intestine 02/20/2003   Qualifier: Diagnosis of   By: Myriam RN, Carissa         Hyperlipidemia    Phreesia 09/04/2020    Hypertension    [facility]reesia 09/04/2020   Insomnia    doesn't take any meds   Monoallelic mutation of SDHA gene 06/25/2021   Neuromuscular disorder (HCC)    Neuropathy    since chemo and radiation   Nocturia    Paresthesia of upper extremity 04/03/2023   PERIPHERAL NEUROPATHY 10/13/2007   Qualifier: Diagnosis of   By: Myriam RN, Carissa      Replacing diagnoses that were inactivated after the 10/10/22 regulatory import     Prediabetes 03/06/2020   Rectosigmoid cancer (HCC) 2004   S/P Surgery, Chemo, Rad Tx,  (after chemo treatment menstral periods have ceased).   Status post bilateral total hip replacement 05/27/2014   Steatosis of liver 03/08/2021    Past Surgical History:  Procedure Laterality Date   ANAL RECTAL MANOMETRY N/A 07/17/2017   Procedure: ANO RECTAL MANOMETRY;  Surgeon: Shila Gustav GAILS, MD;  Location: [facility];  Service: Endoscopy;  Laterality: N/A;   COLECTOMY  Aug 2004   S/P Low Anterior Resxn (Sigmoid Colon)   COLON SURGERY N/A    Phreesia 09/04/2020  COLONOSCOPY     DILATION AND CURETTAGE OF UTERUS  1991   JOINT REPLACEMENT N/A    Phreesia 09/04/2020   LUMBAR DISC SURGERY  Feb 2007   SPINE SURGERY N/A    Phreesia 09/04/2020   TOTAL HIP ARTHROPLASTY Right 05/27/2014   Procedure: RIGHT TOTAL HIP ARTHROPLASTY ANTERIOR APPROACH;  Surgeon: Maude KANDICE Herald, MD;  Location: [facility] OR;  Service: Orthopedics;  Laterality: Right;   TOTAL HIP ARTHROPLASTY Left 08/25/2015   Procedure: TOTAL HIP ARTHROPLASTY ANTERIOR APPROACH;  Surgeon: Maude Herald, MD;  Location: [facility] OR;  Service: Orthopedics;  Laterality: Left;    Family History  Adopted: Yes  Problem Relation Age of Onset   Colitis Neg Hx    Colon cancer Neg Hx    Colon polyps Neg Hx    Esophageal cancer Neg Hx    Rectal cancer Neg Hx    Stomach cancer Neg Hx    Sleep apnea Neg Hx     Social History   Socioeconomic History   Marital status: Married    Spouse name: Koren   Number of children: 1   Years of  education: Not on file   Highest education level: Master's degree (e.g., MA, MS, MEng, MEd, MSW, MBA)  Occupational History   Occupation: office work  Tobacco Use   Smoking status: Former    Types: Cigarettes    Passive exposure: Past   Smokeless tobacco: Never   Tobacco comments:    smoked some socially in my college days  Vaping Use   Vaping status: Never Used  Substance and Sexual Activity   Alcohol use: Yes    Alcohol/week: 14.0 standard drinks of alcohol    Types: 14 Glasses of wine per week   Drug use: No   Sexual activity: Yes    Partners: Male    Birth control/protection: None  Other Topics Concern   Not on file  Social History Narrative   Lives with husband   Exercise walking 2 x weekly   Patient's family history -  UNKNOWN ADOPTED   Retired    Chief Executive Officer Drivers of Corporate investment banker Strain: Low Risk  (10/23/2023)   Overall Financial Resource Strain (CARDIA)    Difficulty of Paying Living Expenses: Not hard at all  Food Insecurity: No Food Insecurity (10/23/2023)   Hunger Vital Sign    Worried About Running Out of Food in the Last Year: Never true    Ran Out of Food in the Last Year: Never true  Transportation Needs: No Transportation Needs (10/23/2023)   PRAPARE - Administrator, Civil Service (Medical): No    Lack of Transportation (Non-Medical): No  Physical Activity: Insufficiently Active (10/23/2023)   Exercise Vital Sign    Days of Exercise per Week: 1 day    Minutes of Exercise per Session: 20 min  Stress: No Stress Concern Present (10/23/2023)   [employer] of Occupational Health - Occupational Stress Questionnaire    Feeling of Stress : Only a little  Social Connections: Socially Integrated (10/23/2023)   Social Connection and Isolation Panel    Frequency of Communication with Friends and Family: More than three times a week    Frequency of Social Gatherings with Friends and Family: More than three times a week    Attends  Religious Services: 1 to 4 times per year    Active Member of [employer] or Organizations: Yes    Attends Banker Meetings: More than 4 times per year  Marital Status: Married  Catering manager Violence: Not on file      Review of systems: All other review of systems negative except as mentioned in the HPI.   Physical Exam: Vitals:   04/02/24 0827  BP: 126/68  Pulse: 84   Body mass index is 27.28 kg/m. Gen:      No acute distress HEENT:  sclera anicteric CV: s1s2 rrr, no murmur Lungs: B/l clear. Abd:      soft, non-tender; no palpable masses, no distension Ext:    No edema Neuro: alert and oriented x 3 Psych: normal mood and affect  Data Reviewed:  Reviewed labs, radiology imaging, old records and pertinent past GI work up    Assessment & Plan Chronic constipation with alternating diarrhea Chronic constipation with episodes of diarrhea, likely exacerbated by Linzess  and Imodium. Linzess  at 290 mcg causes excessive bowel movements, while Imodium leads to constipation. Benefiber is not optimally distributed throughout the day. Physical therapy was ineffective. The goal is to avoid Imodium to prevent the cycle of constipation and diarrhea. - Provide samples of Linzess  145 mcg to try daily after breakfast or lunch - Adjust Benefiber intake to 2 teaspoons twice daily, or 1 teaspoon with each meal - Avoid Imodium if possible, but use if necessary for social situations - Monitor response to lower dose of Linzess  and adjust prescription if effective  Abdominal pain Abdominal pain likely related to constipation and hiatal hernia. Pain is not burning but is exacerbated by constipation and bowel movement issues. - Manage constipation and reflux as per above plans to alleviate abdominal pain  Hiatal hernia with reflux symptoms Hiatal hernia with associated reflux symptoms, exacerbated by constipation. Pain is not burning but related to constipation. Discussed potential  for increased reflux symptoms due to back pressure from constipation. An upper endoscopy is considered to evaluate for esophagitis or other issues, as it has not been done in 20 years. Cardiac clearance is required due to aortic aneurysm. - Prescribe pantoprazole  20 mg as needed for reflux symptoms - Order upper endoscopy with cardiac clearance due to aortic aneurysm - Schedule upper endoscopy for Monday, November 10th at 9:00 AM  Aortic aneurysm Aortic aneurysm is being monitored. Current size is 4.6 cm. Blood pressure management is crucial to prevent further complications. - Ensure blood pressure is maintained around 120/80 mmHg - Coordinate with cardiologist for ongoing management     The patient was provided an opportunity to ask questions and all were answered. The patient agreed with the plan and demonstrated an understanding of the instructions.  LOIS Wilkie Mcgee , MD    CC: Purcell Emil Schanz, [place]

## 2024-04-03 NOTE — Telephone Encounter (Signed)
   Patient Name: [name] [name]  DOB: [birth year] MRN: [id number]  Primary Cardiologist: Madonna Large, DO  Chart reviewed as part of pre-operative protocol coverage. Given past medical history and time since last visit, based on ACC/AHA guidelines, [name] [name] is at acceptable risk for the planned procedure without further cardiovascular testing.   Per Dr. Large: 04/02/2024 Acceptable risk for upcoming endoscopy.    Sunit Tolia, DO, [facility]      The patient was advised that if she develops new symptoms prior to surgery to contact our office to arrange for a follow-up visit, and she verbalized understanding.  I will route this recommendation to the requesting party via Epic fax function and remove from pre-op pool.  Please call with questions.  Lamarr Satterfield, NP 04/03/2024, 9:25 AM

## 2024-04-03 NOTE — Telephone Encounter (Signed)
 Clifford thanks

## 2024-04-03 NOTE — Telephone Encounter (Signed)
 FYI Dr Shila   Patient is cleared from cardiology, see note

## 2024-04-04 ENCOUNTER — Encounter: Payer: Self-pay | Admitting: Gastroenterology

## 2024-04-10 NOTE — Telephone Encounter (Signed)
 Left message for patient to make sure she knows she is cleared from cardiology for her Endoscopy

## 2024-04-16 ENCOUNTER — Encounter: Payer: Self-pay | Admitting: Cardiology

## 2024-04-22 ENCOUNTER — Encounter: Payer: Self-pay | Admitting: Emergency Medicine

## 2024-04-22 ENCOUNTER — Ambulatory Visit: Payer: Self-pay | Admitting: Emergency Medicine

## 2024-04-22 ENCOUNTER — Ambulatory Visit (INDEPENDENT_AMBULATORY_CARE_PROVIDER_SITE_OTHER): Admitting: Emergency Medicine

## 2024-04-22 VITALS — BP 140/70 | HR 78 | Temp 97.7°F | Ht 63.0 in | Wt 154.0 lb

## 2024-04-22 DIAGNOSIS — I10 Essential (primary) hypertension: Secondary | ICD-10-CM

## 2024-04-22 DIAGNOSIS — I351 Nonrheumatic aortic (valve) insufficiency: Secondary | ICD-10-CM | POA: Diagnosis not present

## 2024-04-22 DIAGNOSIS — R7303 Prediabetes: Secondary | ICD-10-CM | POA: Diagnosis not present

## 2024-04-22 DIAGNOSIS — C19 Malignant neoplasm of rectosigmoid junction: Secondary | ICD-10-CM

## 2024-04-22 DIAGNOSIS — E538 Deficiency of other specified B group vitamins: Secondary | ICD-10-CM | POA: Diagnosis not present

## 2024-04-22 DIAGNOSIS — Z85038 Personal history of other malignant neoplasm of large intestine: Secondary | ICD-10-CM | POA: Diagnosis not present

## 2024-04-22 DIAGNOSIS — G629 Polyneuropathy, unspecified: Secondary | ICD-10-CM

## 2024-04-22 DIAGNOSIS — E041 Nontoxic single thyroid nodule: Secondary | ICD-10-CM | POA: Diagnosis not present

## 2024-04-22 DIAGNOSIS — E785 Hyperlipidemia, unspecified: Secondary | ICD-10-CM

## 2024-04-22 DIAGNOSIS — I7789 Other specified disorders of arteries and arterioles: Secondary | ICD-10-CM | POA: Diagnosis not present

## 2024-04-22 DIAGNOSIS — K760 Fatty (change of) liver, not elsewhere classified: Secondary | ICD-10-CM | POA: Diagnosis not present

## 2024-04-22 DIAGNOSIS — I700 Atherosclerosis of aorta: Secondary | ICD-10-CM | POA: Diagnosis not present

## 2024-04-22 LAB — LIPID PANEL
Cholesterol: 177 mg/dL (ref 0–200)
HDL: 66.1 mg/dL (ref >39.00)
LDL Cholesterol: 81 mg/dL (ref 0–99)
NonHDL: 111.02
Total CHOL/HDL Ratio: 3
Triglycerides: 149 mg/dL (ref 0.0–149.0)
VLDL: 29.8 mg/dL (ref 0.0–40.0)

## 2024-04-22 LAB — COMPREHENSIVE METABOLIC PANEL WITH GFR
ALT: 23 U/L (ref 0–35)
AST: 23 U/L (ref 0–37)
Albumin: 4.4 g/dL (ref 3.5–5.2)
Alkaline Phosphatase: 47 U/L (ref 39–117)
BUN: 13 mg/dL (ref 6–23)
CO2: 25 meq/L (ref 19–32)
Calcium: 9.2 mg/dL (ref 8.4–10.5)
Chloride: 100 meq/L (ref 96–112)
Creatinine, Ser: 0.88 mg/dL (ref 0.40–1.20)
GFR: 67.88 mL/min (ref >60.00)
Glucose, Bld: 140 mg/dL — ABNORMAL HIGH (ref 70–99)
Potassium: 4.4 meq/L (ref 3.5–5.1)
Sodium: 137 meq/L (ref 135–145)
Total Bilirubin: 0.9 mg/dL (ref 0.2–1.2)
Total Protein: 6.8 g/dL (ref 6.0–8.3)

## 2024-04-22 LAB — VITAMIN B12: Vitamin B-12: 839 pg/mL (ref 211–911)

## 2024-04-22 LAB — HEMOGLOBIN A1C: Hgb A1c MFr Bld: 6.8 % — ABNORMAL HIGH (ref 4.6–6.5)

## 2024-04-22 NOTE — Assessment & Plan Note (Signed)
Diet and nutrition discussed. Cardiovascular risk associated with diabetes discussed.

## 2024-04-22 NOTE — Assessment & Plan Note (Signed)
Stable.  Continues surveillance by cardiology

## 2024-04-22 NOTE — Progress Notes (Addendum)
 [name] [name] 68 y.o.   Chief Complaint  Patient presents with   Follow-up    Patient here for 6 month f/u and possible lab work. Patient wants to talk about her worsen arthritis.      HISTORY OF PRESENT ILLNESS: This is a 68 y.o. female here for 23-month follow-up of multiple chronic medical conditions Arthritis still bothering her some Recent CT of chest showed incidental thyroid  nodule.  Needs follow-up Otherwise doing well. No other complaints or medical concerns today.  HPI   Prior to Admission medications   Medication Sig Start Date End Date Taking? Authorizing Provider  Artificial Tear Ointment (DRY EYES OP) Apply 1 drop to eye as needed (dry eyes).   Yes [provider]  Cyanocobalamin  (B-12 PO) Take by mouth.   Yes [provider]  finasteride (PROPECIA) 1 MG tablet Take 1 mg by mouth daily. 10/29/21  Yes [provider]  Gabapentin , Once-Daily, 600 MG TABS Take 1 tablet (600 mg total) by mouth at bedtime. 10/04/23  Yes Ines Onetha NOVAK, MD  LINZESS  290 MCG CAPS capsule TAKE 1 CAPSULE BY MOUTH DAILY BEFORE BREAKFAST. 12/14/23  Yes Nandigam, Kavitha V, MD  lisinopril  (ZESTRIL ) 40 MG tablet TAKE 1 TABLET BY MOUTH EVERY DAY 11/09/23  Yes Sloka Volante, Emil Schanz, MD  meloxicam  (MOBIC ) 15 MG tablet TAKE 1 TABLET (15 MG TOTAL) BY MOUTH DAILY. 02/04/24  Yes Paula Zietz, Emil Schanz, MD  metoprolol  succinate (TOPROL  XL) 25 MG 24 hr tablet Take 1 tablet (25 mg total) by mouth every morning. Hold for BP <100 or Pulse <55 03/27/24  Yes Tolia, Sunit, DO  pantoprazole  (PROTONIX ) 20 MG tablet Take 1 tablet (20 mg total) by mouth daily. 04/02/24  Yes Shila Gustav GAILS, MD  simvastatin  (ZOCOR ) 40 MG tablet Take 1 tablet (40 mg total) by mouth daily at 6 PM. 06/01/23  Yes Revankar, Jennifer SAUNDERS, MD    No Known Allergies  Patient Active Problem List   Diagnosis Date Noted   Vitamin B12 deficiency 04/22/2024   Thyroid  nodule 04/22/2024   Stress incontinence 10/24/2023    Cervicalgia 10/04/2023   Myofascial pain syndrome, cervical 10/04/2023   Cervical stenosis of spinal canal 10/04/2023   DDD (degenerative disc disease), cervical 10/04/2023   Blood transfusion without reported diagnosis    Essential hypertension, benign    Hypertension    Neuropathy 10/25/2021   Insomnia 10/25/2021   History of blood transfusion 10/25/2021   Heart murmur 10/25/2021   Diverticulosis 10/25/2021   Cancer (HCC) 10/25/2021   Arthritis 10/25/2021   Aortic regurgitation 10/25/2021   Anemia 10/25/2021   Monoallelic mutation of SDHA gene 06/25/2021   History of colonic polyps 05/21/2021   Atherosclerosis of aorta 03/08/2021   Steatosis of liver 03/08/2021   Dyslipidemia 03/08/2021   Prediabetes 03/06/2020   Abnormal echocardiogram 05/08/2019   Enlarged thoracic aorta 04/17/2019   Elevated cholesterol 02/14/2018   Status post bilateral total hip replacement 05/27/2014   PERIPHERAL NEUROPATHY 10/13/2007   Essential hypertension 10/13/2007   HERNIA, UMBILICAL 10/13/2007   COLONIC POLYPS, ADENOMATOUS 02/20/2003   DIVERTICULOSIS, COLON 02/20/2003   History of malignant neoplasm of large intestine 02/20/2003   Rectosigmoid cancer (HCC) 2004    Past Medical History:  Diagnosis Date   Abnormal echocardiogram 05/08/2019   Anemia    hx of, not at this time   ANXIETY DISORDER- mixed disorder w/ depressive symptoms 02/07/2014   Aortic regurgitation    Arthritis    right hip; left hip; most of  my joints (05/27/2014)   Atherosclerosis of aorta 03/08/2021   Blood transfusion without reported diagnosis    Phreesia 09/04/2020   Cancer (HCC)    [facility] 09/04/2020   COLONIC POLYPS, ADENOMATOUS 02/20/2003   Qualifier: Diagnosis of   By: Myriam RN, Carissa         Depression    Diverticulosis    DIVERTICULOSIS, COLON 02/20/2003   Qualifier: Diagnosis of   By: Myriam RN, Carissa      Replacing diagnoses that were inactivated after the 10/10/22 regulatory import      Dyslipidemia 03/08/2021   Elevated cholesterol 02/14/2018   Enlarged thoracic aorta 04/17/2019   Essential hypertension 10/13/2007   Qualifier: Diagnosis of   By: Myriam RN, Maudie         Essential hypertension, benign    takes Lisinopril -HCTZ daily   Heart murmur    HERNIA, UMBILICAL 10/13/2007   Qualifier: Diagnosis of   By: Myriam RN, Carissa      Replacing diagnoses that were inactivated after the 10/10/22 regulatory import     History of blood transfusion    no abnormal reaction noted 2015   History of colonic polyps 05/21/2021   History of malignant neoplasm of large intestine 02/20/2003   Qualifier: Diagnosis of   By: Myriam RN, Carissa         Hyperlipidemia    [facility]reesia 09/04/2020   Hypertension    [facility]ia 09/04/2020   Insomnia    doesn't take any meds   Monoallelic mutation of SDHA gene 06/25/2021   Neuromuscular disorder (HCC)    Neuropathy    since chemo and radiation   Nocturia    Paresthesia of upper extremity 04/03/2023   PERIPHERAL NEUROPATHY 10/13/2007   Qualifier: Diagnosis of   By: Myriam RN, Carissa      Replacing diagnoses that were inactivated after the 10/10/22 regulatory import     Prediabetes 03/06/2020   Rectosigmoid cancer (HCC) 2004   S/P Surgery, Chemo, Rad Tx,  (after chemo treatment menstral periods have ceased).   Status post bilateral total hip replacement 05/27/2014   Steatosis of liver 03/08/2021    Past Surgical History:  Procedure Laterality Date   ANAL RECTAL MANOMETRY N/A 07/17/2017   Procedure: ANO RECTAL MANOMETRY;  Surgeon: Shila Gustav GAILS, MD;  Location: [facility];  Service: Endoscopy;  Laterality: N/A;   COLECTOMY  Aug 2004   S/P Low Anterior Resxn (Sigmoid Colon)   COLON SURGERY N/A    Phreesia 09/04/2020   COLONOSCOPY     DILATION AND CURETTAGE OF UTERUS  1991   JOINT REPLACEMENT N/A    Phreesia 09/04/2020   LUMBAR DISC SURGERY  Feb 2007   SPINE SURGERY N/A    [facility]reesia 09/04/2020   TOTAL HIP ARTHROPLASTY Right  05/27/2014   Procedure: RIGHT TOTAL HIP ARTHROPLASTY ANTERIOR APPROACH;  Surgeon: Maude KANDICE Herald, MD;  Location: [facility];  Service: Orthopedics;  Laterality: Right;   TOTAL HIP ARTHROPLASTY Left 08/25/2015   Procedure: TOTAL HIP ARTHROPLASTY ANTERIOR APPROACH;  Surgeon: Maude Herald, MD;  Location: [facility];  Service: Orthopedics;  Laterality: Left;    Social History   Socioeconomic History   Marital status: Married    Spouse name: Koren   Number of children: 1   Years of education: Not on file   Highest education level: Master's degree (e.g., MA, MS, MEng, MEd, MSW, MBA)  Occupational History   Occupation: office work  Tobacco Use   Smoking status: Former    Types: Cigarettes  Passive exposure: Past   Smokeless tobacco: Never   Tobacco comments:    smoked some socially in my college days  Vaping Use   Vaping status: Never Used  Substance and Sexual Activity   Alcohol use: Yes    Alcohol/week: 14.0 standard drinks of alcohol    Types: 14 Glasses of wine per week   Drug use: No   Sexual activity: Yes    Partners: Male    Birth control/protection: None  Other Topics Concern   Not on file  Social History Narrative   Lives with husband   Exercise walking 2 x weekly   Patient's family history -  UNKNOWN ADOPTED   Retired    Chief Executive Officer Drivers of Corporate investment banker Strain: Low Risk  (04/21/2024)   Overall Financial Resource Strain (CARDIA)    Difficulty of Paying Living Expenses: Not hard at all  Food Insecurity: No Food Insecurity (04/21/2024)   Hunger Vital Sign    Worried About Running Out of Food in the Last Year: Never true    Ran Out of Food in the Last Year: Never true  Transportation Needs: No Transportation Needs (04/21/2024)   PRAPARE - Administrator, Civil Service (Medical): No    Lack of Transportation (Non-Medical): No  Physical Activity: Insufficiently Active (04/21/2024)   Exercise Vital Sign    Days of Exercise per Week: 2 days     Minutes of Exercise per Session: 20 min  Stress: No Stress Concern Present (04/21/2024)   [employer] of Occupational Health - Occupational Stress Questionnaire    Feeling of Stress: Only a little  Social Connections: Moderately Integrated (04/21/2024)   Social Connection and Isolation Panel    Frequency of Communication with Friends and Family: More than three times a week    Frequency of Social Gatherings with Friends and Family: Three times a week    Attends Religious Services: Never    Active Member of Clubs or Organizations: Yes    Attends Engineer, structural: More than 4 times per year    Marital Status: Married  Catering manager Violence: Not on file    Family History  Adopted: Yes  Problem Relation Age of Onset   Colitis Neg Hx    Colon cancer Neg Hx    Colon polyps Neg Hx    Esophageal cancer Neg Hx    Rectal cancer Neg Hx    Stomach cancer Neg Hx    Sleep apnea Neg Hx      Review of Systems  Constitutional: Negative.  Negative for chills and fever.  HENT: Negative.  Negative for congestion and sore throat.   Respiratory: Negative.  Negative for cough and shortness of breath.   Cardiovascular: Negative.  Negative for chest pain and palpitations.  Gastrointestinal:  Negative for abdominal pain, diarrhea, nausea and vomiting.  Genitourinary: Negative.  Negative for dysuria and hematuria.  Skin: Negative.  Negative for rash.  Neurological: Negative.  Negative for dizziness and headaches.  All other systems reviewed and are negative.   Vitals:   04/22/24 0855  BP: (!) 140/70  Pulse: 78  Temp: 97.7 F (36.5 C)  SpO2: 99%    Physical Exam Vitals reviewed.  Constitutional:      Appearance: Normal appearance.  HENT:     Head: Normocephalic.     Mouth/Throat:     Mouth: Mucous membranes are moist.     Pharynx: Oropharynx is clear.  Eyes:     Extraocular  Movements: Extraocular movements intact.     Pupils: Pupils are equal, round, and  reactive to light.  Cardiovascular:     Rate and Rhythm: Normal rate and regular rhythm.     Pulses: Normal pulses.     Heart sounds: Murmur heard.  Pulmonary:     Effort: Pulmonary effort is normal.     Breath sounds: Normal breath sounds.  Musculoskeletal:        General: Deformity (Positive Heberden's nodes) present. Normal range of motion.     Cervical back: No tenderness.  Lymphadenopathy:     Cervical: No cervical adenopathy.  Skin:    General: Skin is warm and dry.     Capillary Refill: Capillary refill takes less than 2 seconds.  Neurological:     General: No focal deficit present.     Mental Status: She is alert and oriented to person, place, and time.  Psychiatric:        Mood and Affect: Mood normal.        Behavior: Behavior normal.      ASSESSMENT & PLAN: A total of 40 minutes was spent with the patient and counseling/coordination of care regarding preparing for this visit, review of most recent office visit notes, review of most recent cardiologist office visit notes, review of most recent chest imaging report, review of multiple chronic medical conditions and their management, review of all medications, review of most recent bloodwork results, review of health maintenance items, education on nutrition, prognosis, documentation, and need for follow up.   Problem List Items Addressed This Visit       Cardiovascular and Mediastinum   Essential hypertension - Primary   BP Readings from Last 3 Encounters:  04/22/24 (!) 140/70  04/02/24 126/68  03/27/24 133/78  Elevated reading in the office but normal readings at home. Continue lisinopril  40 mg daily and metoprolol  succinate 25 mg daily Cardiovascular risks associated with hypertension discussed Diet and nutrition discusse       Enlarged thoracic aorta   Stable.  Continues surveillance by cardiology      Atherosclerosis of aorta   Diet and nutrition discussed Continue simvastatin  40 mg daily       Aortic regurgitation   Clinically stable.  No signs of congestive heart failure Asymptomatic.      Essential hypertension, benign   Relevant Orders   Comprehensive metabolic panel with GFR (Completed)   Lipid panel (Completed)     Digestive   Steatosis of liver   Diet and nutrition discussed Continue simvastatin  40 mg daily      Rectosigmoid cancer (HCC)   Clinically stable. No concerns         Endocrine   Thyroid  nodule   Incidental finding on recent chest CT Clinically euthyroid Recommend thyroid  ultrasound      Relevant Orders   US  THYROID    TSH     Nervous and Auditory   Neuropathy   Stable.  Continues gabapentin  400 mg at bedtime        Other   Prediabetes   Diet and nutrition discussed Cardiovascular risk associated with diabetes discussed      Relevant Orders   Comprehensive metabolic panel with GFR (Completed)   Hemoglobin A1c (Completed)   Dyslipidemia   Diet and nutrition discussed Continue simvastatin  40 mg daily Lipid panel done today      Relevant Orders   Comprehensive metabolic panel with GFR (Completed)   Lipid panel (Completed)   Vitamin B12 deficiency   Relevant  Orders   Vitamin B12 (Completed)     Patient Instructions  Health Maintenance After Age 20 After age 44, you are at a higher risk for certain long-term diseases and infections as well as injuries from falls. Falls are a major cause of broken bones and head injuries in people who are older than age 52. Getting regular preventive care can help to keep you healthy and well. Preventive care includes getting regular testing and making lifestyle changes as recommended by your health care provider. Talk with your health care provider about: Which screenings and tests you should have. A screening is a test that checks for a disease when you have no symptoms. A diet and exercise plan that is right for you. What should I know about screenings and tests to prevent falls? Screening and  testing are the best ways to find a health problem early. Early diagnosis and treatment give you the best chance of managing medical conditions that are common after age 18. Certain conditions and lifestyle choices may make you more likely to have a fall. Your health care provider may recommend: Regular vision checks. Poor vision and conditions such as cataracts can make you more likely to have a fall. If you wear glasses, make sure to get your prescription updated if your vision changes. Medicine review. Work with your health care provider to regularly review all of the medicines you are taking, including over-the-counter medicines. Ask your health care provider about any side effects that may make you more likely to have a fall. Tell your health care provider if any medicines that you take make you feel dizzy or sleepy. Strength and balance checks. Your health care provider may recommend certain tests to check your strength and balance while standing, walking, or changing positions. Foot health exam. Foot pain and numbness, as well as not wearing proper footwear, can make you more likely to have a fall. Screenings, including: Osteoporosis screening. Osteoporosis is a condition that causes the bones to get weaker and break more easily. Blood pressure screening. Blood pressure changes and medicines to control blood pressure can make you feel dizzy. Depression screening. You may be more likely to have a fall if you have a fear of falling, feel depressed, or feel unable to do activities that you used to do. Alcohol use screening. Using too much alcohol can affect your balance and may make you more likely to have a fall. Follow these instructions at home: Lifestyle Do not drink alcohol if: Your health care provider tells you not to drink. If you drink alcohol: Limit how much you have to: 0-1 drink a day for women. 0-2 drinks a day for men. Know how much alcohol is in your drink. In the [place] one drink  equals one 12 oz bottle of beer (355 mL), one 5 oz glass of wine (148 mL), or one 1 oz glass of hard liquor (44 mL). Do not use any products that contain nicotine or tobacco. These products include cigarettes, chewing tobacco, and vaping devices, such as e-cigarettes. If you need help quitting, ask your health care provider. Activity  Follow a regular exercise program to stay fit. This will help you maintain your balance. Ask your health care provider what types of exercise are appropriate for you. If you need a cane or walker, use it as recommended by your health care provider. Wear supportive shoes that have nonskid soles. Safety  Remove any tripping hazards, such as rugs, cords, and clutter. Install safety equipment such  as grab bars in bathrooms and safety rails on stairs. Keep rooms and walkways well-lit. General instructions Talk with your health care provider about your risks for falling. Tell your health care provider if: You fall. Be sure to tell your health care provider about all falls, even ones that seem minor. You feel dizzy, tiredness (fatigue), or off-balance. Take over-the-counter and prescription medicines only as told by your health care provider. These include supplements. Eat a healthy diet and maintain a healthy weight. A healthy diet includes low-fat dairy products, low-fat (lean) meats, and fiber from whole grains, beans, and lots of fruits and vegetables. Stay current with your vaccines. Schedule regular health, dental, and eye exams. Summary Having a healthy lifestyle and getting preventive care can help to protect your health and wellness after age 61. Screening and testing are the best way to find a health problem early and help you avoid having a fall. Early diagnosis and treatment give you the best chance for managing medical conditions that are more common for people who are older than age 68. Falls are a major cause of broken bones and head injuries in people  who are older than age 22. Take precautions to prevent a fall at home. Work with your health care provider to learn what changes you can make to improve your health and wellness and to prevent falls. This information is not intended to replace advice given to you by your health care provider. Make sure you discuss any questions you have with your health care provider. Document Revised: 11/16/2020 Document Reviewed: 11/16/2020 Elsevier Patient Education  2024 Elsevier Inc.     Emil Schaumann, MD [facility]

## 2024-04-22 NOTE — Assessment & Plan Note (Signed)
Stable.  Continues gabapentin 400 mg at bedtime

## 2024-04-22 NOTE — Patient Instructions (Signed)
 Health Maintenance After Age 67 After age 27, you are at a higher risk for certain long-term diseases and infections as well as injuries from falls. Falls are a major cause of broken bones and head injuries in people who are older than age 73. Getting regular preventive care can help to keep you healthy and well. Preventive care includes getting regular testing and making lifestyle changes as recommended by your health care provider. Talk with your health care provider about: Which screenings and tests you should have. A screening is a test that checks for a disease when you have no symptoms. A diet and exercise plan that is right for you. What should I know about screenings and tests to prevent falls? Screening and testing are the best ways to find a health problem early. Early diagnosis and treatment give you the best chance of managing medical conditions that are common after age 90. Certain conditions and lifestyle choices may make you more likely to have a fall. Your health care provider may recommend: Regular vision checks. Poor vision and conditions such as cataracts can make you more likely to have a fall. If you wear glasses, make sure to get your prescription updated if your vision changes. Medicine review. Work with your health care provider to regularly review all of the medicines you are taking, including over-the-counter medicines. Ask your health care provider about any side effects that may make you more likely to have a fall. Tell your health care provider if any medicines that you take make you feel dizzy or sleepy. Strength and balance checks. Your health care provider may recommend certain tests to check your strength and balance while standing, walking, or changing positions. Foot health exam. Foot pain and numbness, as well as not wearing proper footwear, can make you more likely to have a fall. Screenings, including: Osteoporosis screening. Osteoporosis is a condition that causes  the bones to get weaker and break more easily. Blood pressure screening. Blood pressure changes and medicines to control blood pressure can make you feel dizzy. Depression screening. You may be more likely to have a fall if you have a fear of falling, feel depressed, or feel unable to do activities that you used to do. Alcohol  use screening. Using too much alcohol  can affect your balance and may make you more likely to have a fall. Follow these instructions at home: Lifestyle Do not drink alcohol  if: Your health care provider tells you not to drink. If you drink alcohol : Limit how much you have to: 0-1 drink a day for women. 0-2 drinks a day for men. Know how much alcohol  is in your drink. In the [place] one drink equals one 12 oz bottle of beer (355 mL), one 5 oz glass of wine (148 mL), or one 1 oz glass of hard liquor (44 mL). Do not use any products that contain nicotine or tobacco. These products include cigarettes, chewing tobacco, and vaping devices, such as e-cigarettes. If you need help quitting, ask your health care provider. Activity  Follow a regular exercise program to stay fit. This will help you maintain your balance. Ask your health care provider what types of exercise are appropriate for you. If you need a cane or walker, use it as recommended by your health care provider. Wear supportive shoes that have nonskid soles. Safety  Remove any tripping hazards, such as rugs, cords, and clutter. Install safety equipment such as grab bars in bathrooms and safety rails on stairs. Keep rooms and walkways  well-lit. General instructions Talk with your health care provider about your risks for falling. Tell your health care provider if: You fall. Be sure to tell your health care provider about all falls, even ones that seem minor. You feel dizzy, tiredness (fatigue), or off-balance. Take over-the-counter and prescription medicines only as told by your health care provider. These include  supplements. Eat a healthy diet and maintain a healthy weight. A healthy diet includes low-fat dairy products, low-fat (lean) meats, and fiber from whole grains, beans, and lots of fruits and vegetables. Stay current with your vaccines. Schedule regular health, dental, and eye exams. Summary Having a healthy lifestyle and getting preventive care can help to protect your health and wellness after age 15. Screening and testing are the best way to find a health problem early and help you avoid having a fall. Early diagnosis and treatment give you the best chance for managing medical conditions that are more common for people who are older than age 42. Falls are a major cause of broken bones and head injuries in people who are older than age 64. Take precautions to prevent a fall at home. Work with your health care provider to learn what changes you can make to improve your health and wellness and to prevent falls. This information is not intended to replace advice given to you by your health care provider. Make sure you discuss any questions you have with your health care provider. Document Revised: 11/16/2020 Document Reviewed: 11/16/2020 Elsevier Patient Education  2024 [employer].

## 2024-04-22 NOTE — Assessment & Plan Note (Signed)
 Diet and nutrition discussed Continue simvastatin  40 mg daily Lipid panel done today

## 2024-04-22 NOTE — Assessment & Plan Note (Signed)
Clinically stable.  No concerns.

## 2024-04-22 NOTE — Assessment & Plan Note (Signed)
 BP Readings from Last 3 Encounters:  04/22/24 (!) 140/70  04/02/24 126/68  03/27/24 133/78  Elevated reading in the office but normal readings at home. Continue lisinopril  40 mg daily and metoprolol  succinate 25 mg daily Cardiovascular risks associated with hypertension discussed Diet and nutrition discusse

## 2024-04-22 NOTE — Assessment & Plan Note (Signed)
 Incidental finding on recent chest CT Clinically euthyroid Recommend thyroid  ultrasound

## 2024-04-22 NOTE — Assessment & Plan Note (Signed)
Diet and nutrition discussed.  Continue simvastatin 40 mg daily.

## 2024-04-22 NOTE — Assessment & Plan Note (Signed)
 Clinically stable.  No signs of congestive heart failure Asymptomatic.

## 2024-04-24 ENCOUNTER — Encounter: Payer: Self-pay | Admitting: Gastroenterology

## 2024-04-24 ENCOUNTER — Other Ambulatory Visit: Payer: Self-pay

## 2024-04-24 MED ORDER — LINACLOTIDE 145 MCG PO CAPS: 145.0000 ug | ORAL_CAPSULE | Freq: Every day | ORAL | 11 refills | Status: AC

## 2024-04-25 NOTE — Assessment & Plan Note (Signed)
 Treated successfully Doing well.  No present treatment necessary

## 2024-04-29 ENCOUNTER — Ambulatory Visit
Admission: RE | Admit: 2024-04-29 | Discharge: 2024-04-29 | Disposition: A | Source: Ambulatory Visit | Attending: Emergency Medicine

## 2024-04-29 DIAGNOSIS — E041 Nontoxic single thyroid nodule: Secondary | ICD-10-CM

## 2024-05-07 ENCOUNTER — Ambulatory Visit (HOSPITAL_BASED_OUTPATIENT_CLINIC_OR_DEPARTMENT_OTHER)
Admission: RE | Admit: 2024-05-07 | Discharge: 2024-05-07 | Disposition: A | Discharge status: Home / Self Care | Source: Ambulatory Visit | Attending: Cardiology | Admitting: Cardiology

## 2024-05-07 ENCOUNTER — Other Ambulatory Visit: Payer: Self-pay | Admitting: Cardiology

## 2024-05-07 DIAGNOSIS — I700 Atherosclerosis of aorta: Secondary | ICD-10-CM | POA: Diagnosis not present

## 2024-05-07 DIAGNOSIS — I7121 Aneurysm of the ascending aorta, without rupture: Secondary | ICD-10-CM | POA: Diagnosis not present

## 2024-05-08 DIAGNOSIS — L649 Androgenic alopecia, unspecified: Secondary | ICD-10-CM | POA: Diagnosis not present

## 2024-05-13 ENCOUNTER — Encounter: Payer: Self-pay | Admitting: Gastroenterology

## 2024-05-13 ENCOUNTER — Ambulatory Visit: Payer: Self-pay | Admitting: Cardiology

## 2024-05-20 ENCOUNTER — Ambulatory Visit (AMBULATORY_SURGERY_CENTER): Admitting: Gastroenterology

## 2024-05-20 ENCOUNTER — Encounter: Payer: Self-pay | Admitting: Gastroenterology

## 2024-05-20 VITALS — BP 158/67 | HR 75 | Temp 97.4°F | Resp 14 | Ht 63.0 in | Wt 154.0 lb

## 2024-05-20 DIAGNOSIS — G8929 Other chronic pain: Secondary | ICD-10-CM

## 2024-05-20 DIAGNOSIS — I10 Essential (primary) hypertension: Secondary | ICD-10-CM | POA: Diagnosis not present

## 2024-05-20 DIAGNOSIS — K2970 Gastritis, unspecified, without bleeding: Secondary | ICD-10-CM | POA: Diagnosis not present

## 2024-05-20 DIAGNOSIS — K2980 Duodenitis without bleeding: Secondary | ICD-10-CM | POA: Diagnosis not present

## 2024-05-20 DIAGNOSIS — R1013 Epigastric pain: Secondary | ICD-10-CM | POA: Diagnosis not present

## 2024-05-20 DIAGNOSIS — K317 Polyp of stomach and duodenum: Secondary | ICD-10-CM

## 2024-05-20 DIAGNOSIS — K3189 Other diseases of stomach and duodenum: Secondary | ICD-10-CM | POA: Diagnosis not present

## 2024-05-20 DIAGNOSIS — F419 Anxiety disorder, unspecified: Secondary | ICD-10-CM | POA: Diagnosis not present

## 2024-05-20 DIAGNOSIS — F32A Depression, unspecified: Secondary | ICD-10-CM | POA: Diagnosis not present

## 2024-05-20 DIAGNOSIS — K319 Disease of stomach and duodenum, unspecified: Secondary | ICD-10-CM | POA: Diagnosis not present

## 2024-05-20 MED ORDER — SODIUM CHLORIDE 0.9 % IV SOLN: 500.0000 mL | Freq: Once | INTRAVENOUS | Status: AC

## 2024-05-20 NOTE — Progress Notes (Unsigned)
 Pt's states no medical or surgical changes since previsit or office visit.

## 2024-05-20 NOTE — Progress Notes (Unsigned)
 [facility] History and Physical   Primary Care Physician:  Purcell Emil Schanz, MD   Reason for Procedure:  Epigastric abd pain, hiatal hernia, GERD  Plan:    EGD with possible interventions as needed     HPI: [name] [name] is a very pleasant 68 y.o. female here for EGD for epigastric abd pain, hiatal hernia and GERD.   The risks and benefits as well as alternatives of endoscopic procedure(s) have been discussed and reviewed.  The patient was provided an opportunity to ask questions and all were answered. The patient agreed with the plan and demonstrated an understanding of the instructions.   Past Medical History:  Diagnosis Date   Abnormal echocardiogram 05/08/2019   Anemia    hx of, not at this time   ANXIETY DISORDER- mixed disorder w/ depressive symptoms 02/07/2014   Aortic regurgitation    Arthritis    right hip; left hip; most of my joints (05/27/2014)   Atherosclerosis of aorta 03/08/2021   Blood transfusion without reported diagnosis    Phreesia 09/04/2020   Cancer (HCC)    Phreesia 09/04/2020   COLONIC POLYPS, ADENOMATOUS 02/20/2003   Qualifier: Diagnosis of   By: Myriam RN, Carissa         Depression    Diverticulosis    DIVERTICULOSIS, COLON 02/20/2003   Qualifier: Diagnosis of   By: Myriam RN, Carissa      Replacing diagnoses that were inactivated after the 10/10/22 regulatory import     Dyslipidemia 03/08/2021   Elevated cholesterol 02/14/2018   Enlarged thoracic aorta 04/17/2019   Essential hypertension 10/13/2007   Qualifier: Diagnosis of   By: Myriam RN, Maudie         Essential hypertension, benign    takes Lisinopril -HCTZ daily   Heart murmur    HERNIA, UMBILICAL 10/13/2007   Qualifier: Diagnosis of   By: Myriam RN, Carissa      Replacing diagnoses that were inactivated after the 10/10/22 regulatory import     History of blood transfusion    no abnormal reaction noted 2015   History of colonic polyps 05/21/2021   History of  malignant neoplasm of large intestine 02/20/2003   Qualifier: Diagnosis of   By: Myriam RN, Carissa         Hyperlipidemia    Phreesia 09/04/2020   Hypertension    Phreesia 09/04/2020   Insomnia    doesn't take any meds   Monoallelic mutation of SDHA gene 06/25/2021   Neuromuscular disorder (HCC)    Neuropathy    since chemo and radiation   Nocturia    Paresthesia of upper extremity 04/03/2023   PERIPHERAL NEUROPATHY 10/13/2007   Qualifier: Diagnosis of   By: Myriam RN, Carissa      Replacing diagnoses that were inactivated after the 10/10/22 regulatory import     Prediabetes 03/06/2020   Rectosigmoid cancer (HCC) 2004   S/P Surgery, Chemo, Rad Tx,  (after chemo treatment menstral periods have ceased).   Status post bilateral total hip replacement 05/27/2014   Steatosis of liver 03/08/2021    Past Surgical History:  Procedure Laterality Date   ANAL RECTAL MANOMETRY N/A 07/17/2017   Procedure: ANO RECTAL MANOMETRY;  Surgeon: Shila Gustav GAILS, MD;  Location: [facility];  Service: Endoscopy;  Laterality: N/A;   COLECTOMY  Aug 2004   S/P Low Anterior Resxn (Sigmoid Colon)   COLON SURGERY N/A    Phreesia 09/04/2020   COLONOSCOPY     DILATION AND CURETTAGE OF UTERUS  1991   JOINT REPLACEMENT N/A    Phreesia 09/04/2020   LUMBAR DISC SURGERY  Feb 2007   SPINE SURGERY N/A    Phreesia 09/04/2020   TOTAL HIP ARTHROPLASTY Right 05/27/2014   Procedure: RIGHT TOTAL HIP ARTHROPLASTY ANTERIOR APPROACH;  Surgeon: Maude KANDICE Herald, MD;  Location: [facility];  Service: Orthopedics;  Laterality: Right;   TOTAL HIP ARTHROPLASTY Left 08/25/2015   Procedure: TOTAL HIP ARTHROPLASTY ANTERIOR APPROACH;  Surgeon: Maude Herald, MD;  Location: [facility];  Service: Orthopedics;  Laterality: Left;    Prior to Admission medications   Medication Sig Start Date End Date Taking? Authorizing Provider  Artificial Tear Ointment (DRY EYES OP) Apply 1 drop to eye as needed (dry eyes).   Yes [provider]   Cyanocobalamin  (B-12 PO) Take by mouth.   Yes [provider]  finasteride (PROPECIA) 1 MG tablet Take 1 mg by mouth daily. 10/29/21  Yes [provider]  Gabapentin , Once-Daily, 600 MG TABS Take 1 tablet (600 mg total) by mouth at bedtime. 10/04/23  Yes Ines Onetha NOVAK, MD  linaclotide  (LINZESS ) 145 MCG CAPS capsule Take 1 capsule (145 mcg total) by mouth daily before breakfast. 04/24/24  Yes Lourene Hoston V, MD  lisinopril  (ZESTRIL ) 40 MG tablet TAKE 1 TABLET BY MOUTH EVERY DAY 11/09/23  Yes Sagardia, Emil Schanz, MD  meloxicam  (MOBIC ) 15 MG tablet TAKE 1 TABLET (15 MG TOTAL) BY MOUTH DAILY. 02/04/24  Yes Sagardia, Emil Schanz, MD  metoprolol  succinate (TOPROL  XL) 25 MG 24 hr tablet Take 1 tablet (25 mg total) by mouth every morning. Hold for BP <100 or Pulse <55 03/27/24  Yes Tolia, Sunit, DO  pantoprazole  (PROTONIX ) 20 MG tablet Take 1 tablet (20 mg total) by mouth daily. 04/02/24  Yes Theresea Trautmann V, MD  simvastatin  (ZOCOR ) 40 MG tablet Take 1 tablet (40 mg total) by mouth daily. 05/07/24  Yes Tolia, Sunit, DO    Current Outpatient Medications  Medication Sig Dispense Refill   Artificial Tear Ointment (DRY EYES OP) Apply 1 drop to eye as needed (dry eyes).     Cyanocobalamin  (B-12 PO) Take by mouth.     finasteride (PROPECIA) 1 MG tablet Take 1 mg by mouth daily.     Gabapentin , Once-Daily, 600 MG TABS Take 1 tablet (600 mg total) by mouth at bedtime. 90 tablet 4   linaclotide  (LINZESS ) 145 MCG CAPS capsule Take 1 capsule (145 mcg total) by mouth daily before breakfast. 30 capsule 11   lisinopril  (ZESTRIL ) 40 MG tablet TAKE 1 TABLET BY MOUTH EVERY DAY 90 tablet 3   meloxicam  (MOBIC ) 15 MG tablet TAKE 1 TABLET (15 MG TOTAL) BY MOUTH DAILY. 90 tablet 3   metoprolol  succinate (TOPROL  XL) 25 MG 24 hr tablet Take 1 tablet (25 mg total) by mouth every morning. Hold for BP <100 or Pulse <55 90 tablet 3   pantoprazole  (PROTONIX ) 20 MG tablet Take 1 tablet (20 mg total) by  mouth daily. 90 tablet 3   simvastatin  (ZOCOR ) 40 MG tablet Take 1 tablet (40 mg total) by mouth daily. 90 tablet 2   Current Facility-Administered Medications  Medication Dose Route Frequency Provider Last Rate Last Admin   0.9 %  sodium chloride  infusion  500 mL Intravenous Once Viki Carrera V, MD       0.9 %  sodium chloride  infusion  500 mL Intravenous Once Korie Brabson V, MD        Allergies as of 05/20/2024   (No Known  Allergies)    Family History  Adopted: Yes  Problem Relation Age of Onset   Colitis Neg Hx    Colon cancer Neg Hx    Colon polyps Neg Hx    Esophageal cancer Neg Hx    Rectal cancer Neg Hx    Stomach cancer Neg Hx    Sleep apnea Neg Hx     Social History   Socioeconomic History   Marital status: Married    Spouse name: Koren   Number of children: 1   Years of education: Not on file   Highest education level: Master's degree (e.g., MA, MS, MEng, MEd, MSW, MBA)  Occupational History   Occupation: office work  Tobacco Use   Smoking status: Former    Types: Cigarettes    Passive exposure: Past   Smokeless tobacco: Never   Tobacco comments:    smoked some socially in my college days  Vaping Use   Vaping status: Never Used  Substance and Sexual Activity   Alcohol use: Yes    Alcohol/week: 14.0 standard drinks of alcohol    Types: 14 Glasses of wine per week   Drug use: No   Sexual activity: Yes    Partners: Male    Birth control/protection: None  Other Topics Concern   Not on file  Social History Narrative   Lives with husband   Exercise walking 2 x weekly   Patient's family history -  UNKNOWN ADOPTED   Retired    Chief Executive Officer Drivers of Corporate Investment Banker Strain: Low Risk  (04/21/2024)   Overall Financial Resource Strain (CARDIA)    Difficulty of Paying Living Expenses: Not hard at all  Food Insecurity: No Food Insecurity (04/21/2024)   Hunger Vital Sign    Worried About Running Out of Food in the Last Year: Never true     Ran Out of Food in the Last Year: Never true  Transportation Needs: No Transportation Needs (04/21/2024)   PRAPARE - Administrator, Civil Service (Medical): No    Lack of Transportation (Non-Medical): No  Physical Activity: Insufficiently Active (04/21/2024)   Exercise Vital Sign    Days of Exercise per Week: 2 days    Minutes of Exercise per Session: 20 min  Stress: No Stress Concern Present (04/21/2024)   [employer] of Occupational Health - Occupational Stress Questionnaire    Feeling of Stress: Only a little  Social Connections: Moderately Integrated (04/21/2024)   Social Connection and Isolation Panel    Frequency of Communication with Friends and Family: More than three times a week    Frequency of Social Gatherings with Friends and Family: Three times a week    Attends Religious Services: Never    Active Member of Clubs or Organizations: Yes    Attends Engineer, Structural: More than 4 times per year    Marital Status: Married  Catering Manager Violence: Not on file    Review of Systems:  All other review of systems negative except as mentioned in the HPI.  Physical Exam: Vital signs in last 24 hours: BP (!) 160/59   Pulse 65   Temp (!) 97.4 F (36.3 C)   Ht 5' 3 (1.6 m)   Wt 154 lb (69.9 kg)   SpO2 99%   BMI 27.28 kg/m  General:   Alert, NAD Lungs:  Clear .   Heart:  Regular rate and rhythm Abdomen:  Soft, nontender and nondistended. Neuro/Psych:  Alert and cooperative. Normal mood  and affect. A and O x 3  Reviewed labs, radiology imaging, old records and pertinent past GI work up  Patient is appropriate for planned procedure(s) and anesthesia in an ambulatory setting   K. Veena Sharine Cadle , MD [phone]

## 2024-05-20 NOTE — Op Note (Signed)
  [facility] Patient Name: [name] [name] Procedure Date: 05/20/2024 10:04 AM MRN: [id number] Endoscopist: Gustav ALONSO Mcgee , MD, [id number] Age: 68 Referring MD:  Date of Birth: Jan 24, 1956 Gender: Female Account #: [id number] Procedure:                Upper GI endoscopy Indications:              Epigastric abdominal pain Medicines:                Monitored Anesthesia Care Procedure:                Pre-Anesthesia Assessment:                           - Prior to the procedure, a History and Physical                            was performed, and patient medications and                            allergies were reviewed. The patient's tolerance of                            previous anesthesia was also reviewed. The risks                            and benefits of the procedure and the sedation                            options and risks were discussed with the patient.                            All questions were answered, and informed consent                            was obtained. Prior Anticoagulants: The patient has                            taken no anticoagulant or antiplatelet agents. ASA                            Grade Assessment: III - A patient with severe                            systemic disease. After reviewing the risks and                            benefits, the patient was deemed in satisfactory                            condition to undergo the procedure.                           After obtaining informed consent, the endoscope was  passed under direct vision. Throughout the                            procedure, the patient's blood pressure, pulse, and                            oxygen saturations were monitored continuously. The                            GIF [id number] #[id number] was introduced through the                            mouth, and advanced to the second part of duodenum.                            The upper GI  endoscopy was accomplished without                            difficulty. The patient tolerated the procedure                            well. Scope In: Scope Out: Findings:                 The Z-line was regular and was found 35 cm from the                            incisors.                           No gross lesions were noted in the entire esophagus.                           A 2 cm hiatal hernia was present.                           Patchy mild inflammation characterized by                            congestion (edema) and erythema was found in the                            entire examined stomach. Biopsies were taken with a                            cold forceps for Helicobacter pylori testing.                           A few 3 to 7 mm sessile polyps with no bleeding and                            no stigmata of recent bleeding were found in the  gastric body. Biopsies were taken with a cold                            forceps for histology.                           The cardia and gastric fundus were normal on                            retroflexion.                           Patchy mildly erythematous mucosa without active                            bleeding was found in the duodenal bulb.                           The second portion of the duodenum was normal. Complications:            No immediate complications. Estimated Blood Loss:     Estimated blood loss was minimal. Impression:               - Z-line regular, 35 cm from the incisors.                           - No gross lesions in the entire esophagus.                           - 2 cm hiatal hernia.                           - Gastritis. Biopsied.                           - A few gastric polyps. Biopsied.                           - Erythematous duodenopathy.                           - Normal second portion of the duodenum. Recommendation:           - Resume previous diet.                            - Continue present medications.                           - Await pathology results.                           - Follow an antireflux regimen.                           - Return to GI office in 6 months. [name], MD 05/20/2024 10:38:07 AM This report has been signed electronically.

## 2024-05-20 NOTE — Progress Notes (Unsigned)
 To pacu, VSS. Report to [email or web address]

## 2024-05-20 NOTE — Patient Instructions (Signed)
 YOU HAD AN ENDOSCOPIC PROCEDURE TODAY AT THE [facility]:   Refer to the procedure report that was given to you for any specific questions about what was found during the examination.  If the procedure report does not answer your questions, please call your gastroenterologist to clarify.  If you requested that your care partner not be given the details of your procedure findings, then the procedure report has been included in a sealed envelope for you to review at your convenience later.  YOU SHOULD EXPECT: Some feelings of bloating in the abdomen. Passage of more gas than usual.  Walking can help get rid of the air that was put into your GI tract during the procedure and reduce the bloating. If you had a lower endoscopy (such as a colonoscopy or flexible sigmoidoscopy) you may notice spotting of blood in your stool or on the toilet paper. If you underwent a bowel prep for your procedure, you may not have a normal bowel movement for a few days.  Please Note:  You might notice some irritation and congestion in your nose or some drainage.  This is from the oxygen used during your procedure.  There is no need for concern and it should clear up in a day or so.  SYMPTOMS TO REPORT IMMEDIATELY:   Following upper endoscopy (EGD)  Vomiting of blood or coffee ground material  New chest pain or pain under the shoulder blades  Painful or persistently difficult swallowing  New shortness of breath  Fever of 100F or higher  Black, tarry-looking stools  For urgent or emergent issues, a gastroenterologist can be reached at any hour by calling [phone]. Do not use MyChart messaging for urgent concerns.    DIET:  We do recommend a small meal at first, but then you may proceed to your regular diet.  Drink plenty of fluids but you should avoid alcoholic beverages for 24 hours.  MEDICATIONS: Continue present medications.  FOLLOW UP: Await pathology results. Return to GI office in 6  months.  Follow anti-reflux precautions: See handout given to you by your recovery nurse.  Educational handouts given to patient: Gastritis, Hiatal Hernia with Anti-Reflux Precautions.  Thank you for allowing us  to provide for your healthcare needs today.  ACTIVITY:  You should plan to take it easy for the rest of today and you should NOT DRIVE or use heavy machinery until tomorrow (because of the sedation medicines used during the test).    FOLLOW UP: Our staff will call the number listed on your records the next business day following your procedure.  We will call around 7:15- 8:00 am to check on you and address any questions or concerns that you may have regarding the information given to you following your procedure. If we do not reach you, we will leave a message.     If any biopsies were taken you will be contacted by phone or by letter within the next 1-3 weeks.  Please call us  at [phone] if you have not heard about the biopsies in 3 weeks.    SIGNATURES/CONFIDENTIALITY: You and/or your care partner have signed paperwork which will be entered into your electronic medical record.  These signatures attest to the fact that that the information above on your After Visit Summary has been reviewed and is understood.  Full responsibility of the confidentiality of this discharge information lies with you and/or your care-partner.

## 2024-05-20 NOTE — Progress Notes (Unsigned)
 Called to room to assist during endoscopic procedure.  Patient ID and intended procedure confirmed with present staff. Received instructions for my participation in the procedure from the performing physician.

## 2024-05-21 ENCOUNTER — Telehealth: Payer: Self-pay | Admitting: *Deleted

## 2024-05-21 NOTE — Telephone Encounter (Signed)
  Follow up Call-     05/20/2024    9:22 AM 05/12/2022    8:30 AM  Call back number  Post procedure Call Back phone  # [phone]  Permission to leave phone message Yes Yes     Patient questions:  Do you have a fever, pain , or abdominal swelling? No. Pain Score  0 *  Have you tolerated food without any problems? Yes.    Have you been able to return to your normal activities? Yes.    Do you have any questions about your discharge instructions: Diet   No. Medications  No. Follow up visit  No.  Do you have questions or concerns about your Care? No.  Actions: * If pain score is 4 or above: No action needed, pain <4.

## 2024-05-24 ENCOUNTER — Ambulatory Visit: Payer: Self-pay | Admitting: Gastroenterology

## 2024-05-24 LAB — SURGICAL PATHOLOGY

## 2024-05-30 NOTE — Telephone Encounter (Signed)
 Tried calling her today - left a voicemail.  Non-urgent just wanted to update her personal and answer any questions.   Dr. Lianette Broussard

## 2024-06-03 ENCOUNTER — Ambulatory Visit (HOSPITAL_BASED_OUTPATIENT_CLINIC_OR_DEPARTMENT_OTHER)
Admission: RE | Admit: 2024-06-03 | Discharge: 2024-06-03 | Disposition: A | Discharge status: Home / Self Care | Source: Ambulatory Visit | Attending: Cardiology | Admitting: Cardiology

## 2024-06-03 DIAGNOSIS — I359 Nonrheumatic aortic valve disorder, unspecified: Secondary | ICD-10-CM | POA: Diagnosis not present

## 2024-06-03 DIAGNOSIS — I7121 Aneurysm of the ascending aorta, without rupture: Secondary | ICD-10-CM | POA: Diagnosis not present

## 2024-06-03 LAB — ECHOCARDIOGRAM COMPLETE
AR max vel: 2.59 cm2
AV Area VTI: 2.93 cm2
AV Area mean vel: 2.68 cm2
AV Mean grad: 4.5 mmHg
AV Peak grad: 8.8 mmHg
AV Vena cont: 0.4 cm
Ao pk vel: 1.48 m/s
Area-P 1/2: 3.83 cm2
Calc EF: 60.3 %
MV M vel: 4.22 m/s
MV Peak grad: 71.2 mmHg
S' Lateral: 2.4 cm
Single Plane A2C EF: 61 %
Single Plane A4C EF: 59.6 %

## 2024-06-05 ENCOUNTER — Encounter: Payer: Self-pay | Admitting: Surgery

## 2024-06-05 ENCOUNTER — Ambulatory Visit: Attending: Surgery | Admitting: Surgery

## 2024-06-05 VITALS — BP 180/78 | HR 64 | Resp 18 | Ht 63.0 in | Wt 154.0 lb

## 2024-06-05 DIAGNOSIS — I7121 Aneurysm of the ascending aorta, without rupture: Secondary | ICD-10-CM | POA: Diagnosis not present

## 2024-06-05 NOTE — Progress Notes (Unsigned)
 [address] [name] [address]             [phone]     Cardiothoracic Surgery Consultation  PCP is Sagardia, Emil Schanz, MD Referring Provider is Michele Richardson, DO  Chief Complaint  Patient presents with   Thoracic Aortic Aneurysm    Review workup    HPI:  The patient is a 68 year old woman history of hypertension, hyperlipidemia, ascending aortic aneurysm, and aortic insufficiency who has been followed by Dr. Michele.  Recent CTA of the chest on 05/07/2024 showed sending aortic aneurysm to measure 4.9 x 5 cm.  There was extensive circumferential calcification of the ascending aorta.  CTA of the chest on 10/23/2023 measured the aorta at 4.6 cm.  This was unchanged from 1 year prior on 10/14/2022 when it was measured at 4.6 cm.  It was measured at 4.6 cm on 01/02/2020 and 4.5 cm on 05/18/2019.  She has had CT scans of the chest dating back to 2008 where it was measured at 4 cm.  The ascending aortic wall has become more calcified over time.  She does have a history of colon cancer in the past and was treated with surgery followed by chemotherapy and radiation.  She has a history of moderate aortic insufficiency and recent echocardiogram 06/03/2024 continue to show moderate aortic insufficiency with pressure half-time of 398 ms and a vena contract of 4 mm with normal left ventricular ejection fraction of 60 to 65% and normal LV diastolic dimension of 3.9 cm.  She reports feeling well overall.  She denies any fatigue or shortness of breath.  She has had some lower extremity edema which occurs over the course of the day when she is up but resolves at night when she is sleeping.  This has been present for several years.  Past Medical History:  Diagnosis Date   Abnormal echocardiogram 05/08/2019   Anemia    hx of, not at this time   ANXIETY DISORDER- mixed disorder w/ depressive symptoms 02/07/2014   Aortic regurgitation    Arthritis    right hip; left hip; most of  my joints (05/27/2014)   Atherosclerosis of aorta 03/08/2021   Blood transfusion without reported diagnosis    Phreesia 09/04/2020   Cancer (HCC)    [employer]reesia 09/04/2020   COLONIC POLYPS, ADENOMATOUS 02/20/2003   Qualifier: Diagnosis of   By: Myriam RN, Carissa         Depression    Diverticulosis    DIVERTICULOSIS, COLON 02/20/2003   Qualifier: Diagnosis of   By: Myriam RN, Carissa      Replacing diagnoses that were inactivated after the 10/10/22 regulatory import     Dyslipidemia 03/08/2021   Elevated cholesterol 02/14/2018   Enlarged thoracic aorta 04/17/2019   Essential hypertension 10/13/2007   Qualifier: Diagnosis of   By: Myriam RN, Maudie         Essential hypertension, benign    takes Lisinopril -HCTZ daily   Heart murmur    HERNIA, UMBILICAL 10/13/2007   Qualifier: Diagnosis of   By: Myriam RN, Carissa      Replacing diagnoses that were inactivated after the 10/10/22 regulatory import     History of blood transfusion    no abnormal reaction noted 2015   History of colonic polyps 05/21/2021   History of malignant neoplasm of large intestine 02/20/2003   Qualifier: Diagnosis of   By: Myriam RN, Maudie  Hyperlipidemia    Phreesia 09/04/2020   Hypertension    [employer]reesia 09/04/2020   Insomnia    doesn't take any meds   Monoallelic mutation of SDHA gene 06/25/2021   Neuromuscular disorder (HCC)    Neuropathy    since chemo and radiation   Nocturia    Paresthesia of upper extremity 04/03/2023   PERIPHERAL NEUROPATHY 10/13/2007   Qualifier: Diagnosis of   By: Myriam RN, Carissa      Replacing diagnoses that were inactivated after the 10/10/22 regulatory import     Prediabetes 03/06/2020   Rectosigmoid cancer (HCC) 2004   S/P Surgery, Chemo, Rad Tx,  (after chemo treatment menstral periods have ceased).   Status post bilateral total hip replacement 05/27/2014   Steatosis of liver 03/08/2021    Past Surgical History:  Procedure Laterality Date   ANAL RECTAL  MANOMETRY N/A 07/17/2017   Procedure: ANO RECTAL MANOMETRY;  Surgeon: Shila Gustav GAILS, MD;  Location: [facility];  Service: Endoscopy;  Laterality: N/A;   COLECTOMY  Aug 2004   S/P Low Anterior Resxn (Sigmoid Colon)   COLON SURGERY N/A    Phreesia 09/04/2020   COLONOSCOPY     DILATION AND CURETTAGE OF UTERUS  1991   JOINT REPLACEMENT N/A    Phreesia 09/04/2020   LUMBAR DISC SURGERY  Feb 2007   SPINE SURGERY N/A    Phreesia 09/04/2020   TOTAL HIP ARTHROPLASTY Right 05/27/2014   Procedure: RIGHT TOTAL HIP ARTHROPLASTY ANTERIOR APPROACH;  Surgeon: Maude KANDICE Herald, MD;  Location: [facility];  Service: Orthopedics;  Laterality: Right;   TOTAL HIP ARTHROPLASTY Left 08/25/2015   Procedure: TOTAL HIP ARTHROPLASTY ANTERIOR APPROACH;  Surgeon: Maude Herald, MD;  Location: [facility];  Service: Orthopedics;  Laterality: Left;    Family History  Adopted: Yes  Problem Relation Age of Onset   Colitis Neg Hx    Colon cancer Neg Hx    Colon polyps Neg Hx    Esophageal cancer Neg Hx    Rectal cancer Neg Hx    Stomach cancer Neg Hx    Sleep apnea Neg Hx     Social History Social History   Tobacco Use   Smoking status: Former    Types: Cigarettes    Passive exposure: Past   Smokeless tobacco: Never   Tobacco comments:    smoked some socially in my college days  Vaping Use   Vaping status: Never Used  Substance Use Topics   Alcohol use: Yes    Alcohol/week: 14.0 standard drinks of alcohol    Types: 14 Glasses of wine per week   Drug use: No    Current Outpatient Medications  Medication Sig Dispense Refill   Artificial Tear Ointment (DRY EYES OP) Apply 1 drop to eye as needed (dry eyes).     Cyanocobalamin  (B-12 PO) Take by mouth.     finasteride (PROPECIA) 1 MG tablet Take 1 mg by mouth daily.     Gabapentin , Once-Daily, 600 MG TABS Take 1 tablet (600 mg total) by mouth at bedtime. 90 tablet 4   linaclotide  (LINZESS ) 145 MCG CAPS capsule Take 1 capsule (145 mcg total) by mouth daily  before breakfast. 30 capsule 11   lisinopril  (ZESTRIL ) 40 MG tablet TAKE 1 TABLET BY MOUTH EVERY DAY 90 tablet 3   meloxicam  (MOBIC ) 15 MG tablet TAKE 1 TABLET (15 MG TOTAL) BY MOUTH DAILY. 90 tablet 3   metoprolol  succinate (TOPROL  XL) 25 MG 24 hr tablet Take 1 tablet (25 mg  total) by mouth every morning. Hold for BP <100 or Pulse <55 90 tablet 3   pantoprazole  (PROTONIX ) 20 MG tablet Take 1 tablet (20 mg total) by mouth daily. 90 tablet 3   simvastatin  (ZOCOR ) 40 MG tablet Take 1 tablet (40 mg total) by mouth daily. 90 tablet 2   Current Facility-Administered Medications  Medication Dose Route Frequency Provider Last Rate Last Admin   0.9 %  sodium chloride  infusion  500 mL Intravenous Once Nandigam, Kavitha V, MD       0.9 %  sodium chloride  infusion  500 mL Intravenous Once Nandigam, Kavitha V, MD        No Known Allergies  Review of Systems  Constitutional:  Negative for activity change and fatigue.  HENT: Negative.    Eyes: Negative.   Respiratory:  Negative for chest tightness and shortness of breath.   Cardiovascular:  Positive for leg swelling. Negative for chest pain and palpitations.  Gastrointestinal: Negative.   Endocrine: Negative.   Genitourinary:  Positive for frequency.  Musculoskeletal:  Positive for arthralgias.  Skin: Negative.   Allergic/Immunologic: Negative.   Neurological:  Negative for dizziness and syncope.  Psychiatric/Behavioral: Negative.      BP (!) 180/78 (BP Location: Left Arm)   Pulse 64   Resp 18   Ht 5' 3 (1.6 m)   Wt 154 lb (69.9 kg)   SpO2 98%   BMI 27.28 kg/m  Physical Exam Constitutional:      Appearance: Normal appearance. She is normal weight.  HENT:     Head: Normocephalic and atraumatic.  Eyes:     Extraocular Movements: Extraocular movements intact.     Pupils: Pupils are equal, round, and reactive to light.  Neck:     Vascular: No carotid bruit.  Cardiovascular:     Rate and Rhythm: Normal rate and regular rhythm.      Pulses: Normal pulses.     Heart sounds: Murmur heard.     Comments: 2/6 diastolic murmur at apex. Pulmonary:     Effort: Pulmonary effort is normal.     Breath sounds: Normal breath sounds.  Abdominal:     General: There is no distension.     Tenderness: There is no abdominal tenderness.  Musculoskeletal:     Comments: Mild edema in both ankles  Skin:    General: Skin is warm and dry.  Neurological:     General: No focal deficit present.     Mental Status: She is alert and oriented to person, place, and time.  Psychiatric:        Mood and Affect: Mood normal.        Behavior: Behavior normal.      Diagnostic Tests:  Narrative & Impression  EXAM: CT CHEST WITHOUT CONTRAST 05/07/2024 11:56:00 AM   TECHNIQUE: CT of the chest was performed without the administration of intravenous contrast. Multiplanar reformatted images are provided for review. Automated exposure control, iterative reconstruction, and/or weight based adjustment of the mA/kV was utilized to reduce the radiation dose to as low as reasonably achievable.   COMPARISON: CT Chest without IV contrast 10/23/2023.   CLINICAL HISTORY: Aortic aneurysm suspected, follow up, no symptoms.   FINDINGS:   MEDIASTINUM: Fusiform ascending thoracic aortic aneurysm is again identified with near circumferential mural calcification. The aneurysm is stable when compared to prior examination of 10/23/2023, measuring 4.9 x 5.0 cm in maximal diameter (c radiologist derived axial reformats). Descending thoracic aorta is of normal caliber. Conventional arch vessel anatomic  configuration with the aortic arch measuring 4.1 cm in diameter at the level of the arch vasculature origins. Extensive multivessel coronary artery calcifications. Global cardiac size within normal limits. Central pulmonary arteries are of normal caliber. No pericardial effusion. The central airways are clear.   LYMPH NODES: No mediastinal, hilar or  axillary lymphadenopathy.   LUNGS AND PLEURA: No focal consolidation or pulmonary edema. No pleural effusion or pneumothorax.   SOFT TISSUES/BONES: Advanced degenerative changes are seen at the thoracolumbar junction. No acute bone abnormality. No lytic or blastic bone lesion.   UPPER ABDOMEN: Limited images of the upper abdomen demonstrates no acute abnormality.   RAF Score: Aortic atherosclerosis (ICD-10: I70.0), Aortic aneurysm (ICD-10: I71.9)   IMPRESSION: 1. Stable fusiform ascending thoracic aortic aneurysm with near circumferential mural calcification, measuring 4.9 x 5.0 cm in maximal diameter; unchanged from 10/23/2023, when measured in standard fashion utilizing corrected reformats. Recommend vascular consultation if not already done so and continue semi-annual imaging. 2. Extensive multivessel coronary artery calcifications. 3. Advanced degenerative changes at the thoracolumbar junction. 4. raf score: Aortic aneurysm (pri89-p28.9)   Electronically signed by: Dorethia Molt MD 05/08/2024 11:01 PM EDT RP Workstation: [id number]       ECHOCARDIOGRAM REPORT       Patient Name:   BELLE JAYSON [name] Date of Exam: 06/03/2024  Medical Rec #:  [id number]       Height:       63.0 in  Accession #:    [id number]      Weight:       154.0 lb  Date of Birth:  1956/02/06        BSA:          1.730 m  Patient Age:    68 years        BP:           125/81 mmHg  Patient Gender: F               HR:           56 bpm.  Exam Location:  [place]   Procedure: 2D Echo, 3D Echo, Cardiac Doppler, Color Doppler and Strain  Analysis            (Both Spectral and Color Flow Doppler were utilized during             procedure).   Indications:    Aneurysm of ascending aorta without rupture [I71.21                  (ICD-10-CM)]; Aortic valve disease [I35.9 (ICD-10-CM)]    History:        Patient has prior history of Echocardiogram examinations,  most                 recent 11/16/2021. CAD, Aortic  Valve Disease; Risk                  Factors:Hypertension, Dyslipidemia and Former Smoker.    Sonographer:    Alan Greenhouse RDMS, RVT, RDCS  Referring Phys: [id number] MADONNA LARGE   IMPRESSIONS     1. Focal basilar septal hypertrophy. Left ventricular ejection fraction,  by estimation, is 60 to 65%. Left ventricular ejection fraction by 3D  volume is 60 %. Left ventricular ejection fraction by 2D MOD biplane is  60.3 %. The left ventricle has normal  function. The left ventricle has no regional wall motion abnormalities.  There is mild concentric left ventricular hypertrophy. Left ventricular  diastolic  parameters were normal. The average left ventricular global  longitudinal strain is -23.3 %. The  global longitudinal strain is normal.   2. Right ventricular systolic function is normal. The right ventricular  size is normal.   3. The mitral valve is degenerative. Mild mitral valve regurgitation. No  evidence of mitral stenosis.   4. VC 4 mm      PT1/2 398 msec      . The aortic valve is tricuspid. Aortic valve regurgitation is  moderate. No aortic stenosis is present.   5. The inferior vena cava is normal in size with greater than 50%  respiratory variability, suggesting right atrial pressure of 3 mmHg.   Comparison(s): Echocardiogram done 5/0/23 showed an EF of 60-65%.   FINDINGS   Left Ventricle: Focal basilar septal hypertrophy. Left ventricular  ejection fraction, by estimation, is 60 to 65%. Left ventricular ejection  fraction by 2D MOD biplane is 60.3 %. Left ventricular ejection fraction  by 3D volume is 60 %. The left  ventricle has normal function. The left ventricle has no regional wall  motion abnormalities. The average left ventricular global longitudinal  strain is -23.3 %. Strain was performed and the global longitudinal strain  is normal. The left ventricular  internal cavity size was normal in size. There is mild concentric left  ventricular hypertrophy. Left  ventricular diastolic parameters were  normal. Normal left ventricular filling pressure.   Right Ventricle: The right ventricular size is normal. No increase in  right ventricular wall thickness. Right ventricular systolic function is  normal.   Left Atrium: Left atrial size was normal in size.   Right Atrium: Right atrial size was normal in size.   Pericardium: There is no evidence of pericardial effusion.   Mitral Valve: The mitral valve is degenerative in appearance. Mild to  moderate mitral annular calcification. Mild mitral valve regurgitation. No  evidence of mitral valve stenosis.   Tricuspid Valve: The tricuspid valve is normal in structure. Tricuspid  valve regurgitation is mild . No evidence of tricuspid stenosis.   Aortic Valve: VC 4 mm  PT1/2 398 msec.  The aortic valve is tricuspid. Aortic valve regurgitation is moderate. No  aortic stenosis is present. Aortic valve mean gradient measures 4.5 mmHg.  Aortic valve peak gradient measures 8.8 mmHg. Aortic valve area, by VTI  measures 2.93 cm.   Pulmonic Valve: The pulmonic valve was normal in structure. Pulmonic valve  regurgitation is mild. No evidence of pulmonic stenosis.   Aorta: The aortic root is normal in size and structure.   Venous: The right upper pulmonary vein is normal. The inferior vena cava  is normal in size with greater than 50% respiratory variability,  suggesting right atrial pressure of 3 mmHg.   IAS/Shunts: No atrial level shunt detected by color flow Doppler.   Additional Comments: 3D was performed not requiring image post processing  on an independent workstation and was normal.     LEFT VENTRICLE  PLAX 2D                        Biplane EF (MOD)  LVIDd:         3.90 cm         LV Biplane EF:   Left  LVIDs:         2.40 cm  ventricular  LV PW:         1.10 cm                          ejection  LV IVS:        1.60 cm                          fraction by  LVOT diam:      2.00 cm                          2D MOD  LV SV:         105                              biplane is  LV SV Index:   60                               60.3 %.  LVOT Area:     3.14 cm                                 Diastology                                 LV e' medial:    7.94 cm/s  LV Volumes (MOD)               LV E/e' medial:  12.5  LV vol d, MOD    111.0 ml      LV e' lateral:   7.40 cm/s  A2C:                           LV E/e' lateral: 13.4  LV vol d, MOD    97.0 ml  A4C:                           2D Longitudinal  LV vol s, MOD    43.3 ml       Strain  A2C:                           2D Strain GLS   -23.4 %  LV vol s, MOD    39.2 ml       (A4C):  A4C:                           2D Strain GLS   -23.9 %  LV SV MOD A2C:   67.7 ml       (A3C):  LV SV MOD A4C:   97.0 ml       2D Strain GLS   -22.7 %  LV SV MOD BP:    63.0 ml       (A2C):                                 2D Strain GLS   -23.3 %  Avg:                                   3D Volume EF                                 LV 3D EF:    Left                                              ventricul                                              ar                                              ejection                                              fraction                                              by 3D                                              volume is                                              60 %.                                   3D Volume EF:                                 3D EF:        60 %                                 LV EDV:       132 ml                                 LV ESV:       52 ml                                 LV SV:  80 ml   RIGHT VENTRICLE  RV S prime:     8.49 cm/s  PULMONARY VEINS  TAPSE (M-mode): 2.0 cm     Diastolic Velocity: 45.50 cm/s                             S/D Velocity:       1.40                             Systolic Velocity:  64.40 cm/s   LEFT  ATRIUM             Index        RIGHT ATRIUM          Index  LA diam:        3.20 cm 1.85 cm/m   RA Area:     7.19 cm  LA Vol (A2C):   51.0 ml 29.47 ml/m  RA Volume:   11.90 ml 6.88 ml/m  LA Vol (A4C):   40.7 ml 23.52 ml/m  LA Biplane Vol: 45.0 ml 26.01 ml/m   AORTIC VALVE  AV Area (Vmax):    2.59 cm  AV Area (Vmean):   2.68 cm  AV Area (VTI):     2.93 cm  AV Vmax:           148.00 cm/s  AV Vmean:          98.350 cm/s  AV VTI:            0.357 m  AV Peak Grad:      8.8 mmHg  AV Mean Grad:      4.5 mmHg  LVOT Vmax:         122.00 cm/s  LVOT Vmean:        84.000 cm/s  LVOT VTI:          0.333 m  LVOT/AV VTI ratio: 0.93  AR Vena Contracta: 0.40 cm    AORTA  Ao Root diam: 3.20 cm  Ao Asc diam:  4.40 cm   MITRAL VALVE               TRICUSPID VALVE  MV Area (PHT): 3.83 cm    TR Peak grad:   31.6 mmHg  MV Decel Time: 198 msec    TR Vmax:        281.00 cm/s  MR Peak grad: 71.2 mmHg  MR Vmax:      422.00 cm/s  SHUNTS  MV E velocity: 99.00 cm/s  Systemic VTI:  0.33 m  MV A velocity: 90.70 cm/s  Systemic Diam: 2.00 cm  MV E/A ratio:  1.09   Redell Leiter MD  Electronically signed by Redell Leiter MD  Signature Date/Time: 06/03/2024/4:44:23 PM        Final       Impression:  This 68 year old woman has a 4.9 x 5 cm fusiform ascending aortic aneurysm that has gradually enlarged over time.  It was noted to be 4 cm in 2008 by report and 4.5 cm in 2020.  It was measured at 4.6 cm in April 2025.  I have personally measured the most recent CT scan from 05/07/2024 which was done without contrast and I do not think there has been any significant change compared to April 2025.  The ascending aortic wall has become progressively calcified over time which may help with stability.  I think surgical replacement  of her aorta would be more difficult due to the extensive calcification that extends up into the aortic arch.  This aneurysm is still below the surgical threshold of 5.5 cm.  She  does have moderate aortic insufficiency which is stable with normal left ventricular systolic function and internal dimensions.  She reports being asymptomatic although she does have some edema in her ankles over the course of the day.  I do not think there is any indication for aortic valve placement at this time but this will require continued follow-up with periodic echocardiograms and monitoring for symptoms.  I reviewed the CTA and echo images with her and answered all of her questions.  I stressed the importance of continued good blood pressure control in preventing further enlargement and acute aortic dissection.  Plan:  I will plan to see her back in 1 year with a CTA of the chest for aortic surveillance.  She will continue to follow-up with Dr. Michele and should have repeat echocardiograms done at least yearly to follow her aortic insufficiency, left ventricular function and dimensions.  I spent 45 minutes performing this consultation and > 50% of this time was spent face to face counseling and coordinating the care of this patient's ascending aortic aneurysm and moderate aortic insufficiency.   Dorise MARLA Fellers, MD [facility] [phone]

## 2024-06-28 ENCOUNTER — Ambulatory Visit

## 2024-06-28 VITALS — BP 138/70 | HR 85 | Ht 63.5 in | Wt 154.9 lb

## 2024-06-28 DIAGNOSIS — Z780 Asymptomatic menopausal state: Secondary | ICD-10-CM

## 2024-06-28 DIAGNOSIS — Z0000 Encounter for general adult medical examination without abnormal findings: Secondary | ICD-10-CM | POA: Diagnosis not present

## 2024-06-28 NOTE — Patient Instructions (Addendum)
 [name] [name],  Thank you for taking the time for your Medicare Wellness Visit. I appreciate your continued commitment to your health goals. Please review the care plan we discussed, and feel free to reach out if I can assist you further.  Please note that Annual Wellness Visits do not include a physical exam. Some assessments may be limited, especially if the visit was conducted virtually. If needed, we may recommend an in-person follow-up with your provider.  Ongoing Care Seeing your primary care provider every 3 to 6 months helps us  monitor your health and provide consistent, personalized care.   Referrals If a referral was made during today's visit and you haven't received any updates within two weeks, please contact the referred provider directly to check on the status.  Recommended Screenings:  Health Maintenance  Topic Date Due   Osteoporosis screening with Bone Density Scan  Never done   Flu Shot  02/09/2024   COVID-19 Vaccine (6 - 2025-26 season) 03/11/2024   Medicare Annual Wellness Visit  06/28/2025   Breast Cancer Screening  07/26/2025   Colon Cancer Screening  05/13/2027   Pneumococcal Vaccine for age over 70  Completed   Hepatitis C Screening  Completed   Zoster (Shingles) Vaccine  Completed   Meningitis B Vaccine  Aged Out   DTaP/Tdap/Td vaccine  Discontinued       10/26/2022    3:40 PM  Advanced Directives  Does Patient Have a Medical Advance Directive? Yes  Type of Advance Directive Living will;Healthcare Power of Attorney  Does patient want to make changes to medical advance directive? No - Patient declined  Copy of Healthcare Power of Attorney in Chart? No - copy requested    Vision: Annual vision screenings are recommended for early detection of glaucoma, cataracts, and diabetic retinopathy. These exams can also reveal signs of chronic conditions such as diabetes and high blood pressure.  Dental: Annual dental screenings help detect early signs of oral cancer,  gum disease, and other conditions linked to overall health, including heart disease and diabetes.

## 2024-06-28 NOTE — Progress Notes (Signed)
 "  Chief Complaint  Patient presents with   Medicare Wellness     Subjective:  Please attest and cosign this visit due to patients primary care provider not being in the office at the time the visit was completed.  (Pt of Dr CHRISTELLA. Purcell)   [name] [name] is a 68 y.o. female who presents for a Medicare Annual Wellness Visit.  Visit info / Clinical Intake: Medicare Wellness Visit Type:: Subsequent Annual Wellness Visit Persons participating in visit and providing information:: patient Medicare Wellness Visit Mode:: In-person (required for WTM) Interpreter Needed?: No Pre-visit prep was completed: yes AWV questionnaire completed by patient prior to visit?: yes Date:: 06/27/24 Living arrangements:: lives with spouse/significant other Patient's Overall Health Status Rating: good Typical amount of pain: some Does pain affect daily life?: (!) yes Are you currently prescribed opioids?: no  Dietary Habits and Nutritional Risks How many meals a day?: 3 Eats fruit and vegetables daily?: yes Most meals are obtained by: preparing own meals In the last 2 weeks, have you had any of the following?: none Diabetic:: no  Functional Status Activities of Daily Living (to include ambulation/medication): Independent Ambulation: Independent with device- listed below Home Assistive Devices/Equipment: Eyeglasses Medication Administration: Independent Home Management (perform basic housework or laundry): Independent Manage your own finances?: yes Primary transportation is: driving Concerns about vision?: no *vision screening is required for WTM* Concerns about hearing?: no  Fall Screening Falls in the past year?: 0 Number of falls in past year: 0 Was there an injury with Fall?: 0 Fall Risk Category Calculator: 0 Patient Fall Risk Level: Low Fall Risk  Fall Risk Patient at Risk for Falls Due to: No Fall Risks Fall risk Follow up: Falls evaluation completed; Falls prevention discussed  Home  and Transportation Safety: All rugs have non-skid backing?: yes All stairs or steps have railings?: yes Grab bars in the bathtub or shower?: yes Have non-skid surface in bathtub or shower?: (!) no Good home lighting?: yes Regular seat belt use?: yes Hospital stays in the last year:: no  Cognitive Assessment Difficulty concentrating, remembering, or making decisions? : no Will 6CIT or Mini Cog be Completed: yes What year is it?: 0 points What month is it?: 0 points Give patient an address phrase to remember (5 components): [address] [place], [place] About what time is it?: 0 points Count backwards from 20 to 1: 0 points Say the months of the year in reverse: 0 points Repeat the address phrase from earlier: 2 points (Drive) 6 CIT Score: 2 points  Advance Directives (For Healthcare) Does Patient Have a Medical Advance Directive?: Yes Does patient want to make changes to medical advance directive?: Yes (Inpatient - patient requests chaplain consult to change a medical advance directive) Type of Advance Directive: Healthcare Power of [place]; Living will Copy of Healthcare Power of Attorney in Chart?: No - copy requested Copy of Living Will in Chart?: No - copy requested  Reviewed/Updated  Reviewed/Updated: Reviewed All (Medical, Surgical, Family, Medications, Allergies, Care Teams, Patient Goals)    Allergies (verified) Patient has no known allergies.   Current Medications (verified) Outpatient Encounter Medications as of 06/28/2024  Medication Sig   Artificial Tear Ointment (DRY EYES OP) Apply 1 drop to eye as needed (dry eyes).   Cyanocobalamin  (B-12 PO) Take by mouth.   finasteride (PROPECIA) 1 MG tablet Take 1 mg by mouth daily.   Gabapentin , Once-Daily, 600 MG TABS Take 1 tablet (600 mg total) by mouth at bedtime.   linaclotide  (  LINZESS ) 145 MCG CAPS capsule Take 1 capsule (145 mcg total) by mouth daily before breakfast.   lisinopril  (ZESTRIL ) 40 MG tablet TAKE 1 TABLET  BY MOUTH EVERY DAY   meloxicam  (MOBIC ) 15 MG tablet TAKE 1 TABLET (15 MG TOTAL) BY MOUTH DAILY.   metoprolol  succinate (TOPROL  XL) 25 MG 24 hr tablet Take 1 tablet (25 mg total) by mouth every morning. Hold for BP <100 or Pulse <55   pantoprazole  (PROTONIX ) 20 MG tablet Take 1 tablet (20 mg total) by mouth daily.   simvastatin  (ZOCOR ) 40 MG tablet Take 1 tablet (40 mg total) by mouth daily.   Facility-Administered Encounter Medications as of 06/28/2024  Medication   0.9 %  sodium chloride  infusion   0.9 %  sodium chloride  infusion    History: Past Medical History:  Diagnosis Date   Abnormal echocardiogram 05/08/2019   Anemia    hx of, not at this time   ANXIETY DISORDER- mixed disorder w/ depressive symptoms 02/07/2014   Aortic regurgitation    Arthritis    right hip; left hip; most of my joints (05/27/2014)   Atherosclerosis of aorta 03/08/2021   Blood transfusion without reported diagnosis    Phreesia 09/04/2020   Cancer (HCC)    [facility]reesia 09/04/2020   COLONIC POLYPS, ADENOMATOUS 02/20/2003   Qualifier: Diagnosis of   By: Myriam RN, Carissa         Depression    Diverticulosis    DIVERTICULOSIS, COLON 02/20/2003   Qualifier: Diagnosis of   By: Myriam RN, Carissa      Replacing diagnoses that were inactivated after the 10/10/22 regulatory import     Dyslipidemia 03/08/2021   Elevated cholesterol 02/14/2018   Enlarged thoracic aorta 04/17/2019   Essential hypertension 10/13/2007   Qualifier: Diagnosis of   By: Myriam RN, Maudie         Essential hypertension, benign    takes Lisinopril -HCTZ daily   Heart murmur    HERNIA, UMBILICAL 10/13/2007   Qualifier: Diagnosis of   By: Myriam RN, Carissa      Replacing diagnoses that were inactivated after the 10/10/22 regulatory import     History of blood transfusion    no abnormal reaction noted 2015   History of colonic polyps 05/21/2021   History of malignant neoplasm of large intestine 02/20/2003   Qualifier: Diagnosis of    By: Myriam RN, Carissa         Hyperlipidemia    Phreesia 09/04/2020   Hypertension    Phreesia 09/04/2020   Insomnia    doesn't take any meds   Monoallelic mutation of SDHA gene 06/25/2021   Neuromuscular disorder (HCC)    Neuropathy    since chemo and radiation   Nocturia    Paresthesia of upper extremity 04/03/2023   PERIPHERAL NEUROPATHY 10/13/2007   Qualifier: Diagnosis of   By: Myriam RN, Carissa      Replacing diagnoses that were inactivated after the 10/10/22 regulatory import     Prediabetes 03/06/2020   Rectosigmoid cancer (HCC) 2004   S/P Surgery, Chemo, Rad Tx,  (after chemo treatment menstral periods have ceased).   Status post bilateral total hip replacement 05/27/2014   Steatosis of liver 03/08/2021   Past Surgical History:  Procedure Laterality Date   ANAL RECTAL MANOMETRY N/A 07/17/2017   Procedure: ANO RECTAL MANOMETRY;  Surgeon: Shila Gustav GAILS, MD;  Location: [facility];  Service: Endoscopy;  Laterality: N/A;   COLECTOMY  Aug 2004   S/P Low Anterior Resxn (  Sigmoid Colon)   COLON SURGERY N/A    Phreesia 09/04/2020   COLONOSCOPY     DILATION AND CURETTAGE OF UTERUS  1991   JOINT REPLACEMENT N/A    Phreesia 09/04/2020   LUMBAR DISC SURGERY  Feb 2007   SPINE SURGERY N/A    Phreesia 09/04/2020   TOTAL HIP ARTHROPLASTY Right 05/27/2014   Procedure: RIGHT TOTAL HIP ARTHROPLASTY ANTERIOR APPROACH;  Surgeon: Maude KANDICE Herald, MD;  Location: [facility] OR;  Service: Orthopedics;  Laterality: Right;   TOTAL HIP ARTHROPLASTY Left 08/25/2015   Procedure: TOTAL HIP ARTHROPLASTY ANTERIOR APPROACH;  Surgeon: Maude Herald, MD;  Location: [facility] OR;  Service: Orthopedics;  Laterality: Left;   Family History  Adopted: Yes  Problem Relation Age of Onset   Colitis Neg Hx    Colon cancer Neg Hx    Colon polyps Neg Hx    Esophageal cancer Neg Hx    Rectal cancer Neg Hx    Stomach cancer Neg Hx    Sleep apnea Neg Hx    Social History   Occupational History   Occupation:  office work  Tobacco Use   Smoking status: Former    Current packs/day: 0.00    Average packs/day: 1 pack/day for 5.0 years (5.0 ttl pk-yrs)    Types: Cigarettes    Start date: 07/11/1976    Quit date: 07/11/1981    Years since quitting: 42.9    Passive exposure: Never   Smokeless tobacco: Never   Tobacco comments:    smoked some socially in my college days  Vaping Use   Vaping status: Never Used  Substance and Sexual Activity   Alcohol use: Yes    Alcohol/week: 14.0 standard drinks of alcohol    Types: 14 Glasses of wine per week   Drug use: No   Sexual activity: Yes    Partners: Male    Birth control/protection: None   Tobacco Counseling Counseling given: Yes Tobacco comments: smoked some socially in my college days  SDOH Screenings   Food Insecurity: No Food Insecurity (06/28/2024)  Housing: Unknown (06/28/2024)  Transportation Needs: No Transportation Needs (06/28/2024)  Utilities: Not At Risk (06/28/2024)  Alcohol Screen: Low Risk (04/21/2024)  Depression (PHQ2-9): Low Risk (06/28/2024)  Financial Resource Strain: Low Risk (04/21/2024)  Physical Activity: Insufficiently Active (06/28/2024)  Social Connections: Moderately Integrated (06/28/2024)  Stress: No Stress Concern Present (06/28/2024)  Tobacco Use: Medium Risk (06/28/2024)  Health Literacy: Adequate Health Literacy (06/28/2024)   See flowsheets for full screening details  Depression Screen PHQ 2 & 9 Depression Scale- Over the past 2 weeks, how often have you been bothered by any of the following problems? Little interest or pleasure in doing things: 0 Feeling down, depressed, or hopeless (PHQ Adolescent also includes...irritable): 0 PHQ-2 Total Score: 0 Trouble falling or staying asleep, or sleeping too much: 1 (gets at least 6hrs of sleep) Feeling tired or having little energy: 0 Poor appetite or overeating (PHQ Adolescent also includes...weight loss): 0 Feeling bad about yourself - or that you are a  failure or have let yourself or your family down: 0 Trouble concentrating on things, such as reading the newspaper or watching television (PHQ Adolescent also includes...like school work): 0 Moving or speaking so slowly that other people could have noticed. Or the opposite - being so fidgety or restless that you have been moving around a lot more than usual: 0 Thoughts that you would be better off dead, or of hurting yourself in some way: 0 PHQ-9 Total  Score: 1 If you checked off any problems, how difficult have these problems made it for you to do your work, take care of things at home, or get along with other people?: Not difficult at all  Depression Treatment Depression Interventions/Treatment : [id number] Score <4 Follow-up Not Indicated     Goals Addressed               This Visit's Progress     Patient Stated (pt-stated)        Patient stated she plans to do more walking and join water aerobics at the [facility]             Objective:    Today's Vitals   06/28/24 0805  BP: 138/70  Pulse: 85  SpO2: 98%  Weight: 154 lb 14.4 oz (70.3 kg)  Height: 5' 3.5 (1.613 m)   Body mass index is 27.01 kg/m.  Hearing/Vision screen Hearing Screening - Comments:: Denies hearing difficulties   Vision Screening - Comments:: Wears rx glasses - up to date with routine eye exams with Dr Raelyn with [facility] Immunizations and Health Maintenance Health Maintenance  Topic Date Due   Bone Density Scan  Never done   COVID-19 Vaccine (6 - 2025-26 season) 03/11/2024   Medicare Annual Wellness (AWV)  06/28/2025   Mammogram  07/26/2025   Colonoscopy  05/13/2027   Pneumococcal Vaccine: 50+ Years  Completed   Influenza Vaccine  Completed   Hepatitis C Screening  Completed   Zoster Vaccines- Shingrix  Completed   Meningococcal B Vaccine  Aged Out   DTaP/Tdap/Td  Discontinued        Assessment/Plan:  This is a routine wellness examination for Lyriq.  Bone Density status:  Ordered  today  Patient Care Team: Purcell Emil Schanz, MD as PCP - General (Internal Medicine) Michele Richardson, DO as PCP - Cardiology (Cardiology) Alvia Dorothyann LABOR, MD (Obstetrics and Gynecology) Murinson, Nancyann RAMAN, MD (Inactive) (Hematology and Oncology) Blanca Elsie RAMAN, MD as Consulting Physician (Cardiology) Sheril Coy, MD as Consulting Physician (Orthopedic Surgery) Shila Gustav GAILS, MD as Consulting Physician (Gastroenterology) Kandyce Sor, MD as Consulting Physician (Obstetrics and Gynecology) Raelyn Harlene DEL, OD (Optometry)  I have personally reviewed and noted the following in the patients chart:   Medical and social history Use of alcohol, tobacco or illicit drugs  Current medications and supplements including opioid prescriptions. Functional ability and status Nutritional status Physical activity Advanced directives List of other physicians Hospitalizations, surgeries, and ER visits in previous 12 months Vitals Screenings to include cognitive, depression, and falls Referrals and appointments  Orders Placed This Encounter  Procedures   DG Bone Density    Standing Status:   Future    Expiration Date:   06/28/2025    Reason for Exam (SYMPTOM  OR DIAGNOSIS REQUIRED):   postmenopausal deficiency    Preferred imaging location?:   [address]   In addition, I have reviewed and discussed with patient certain preventive protocols, quality metrics, and best practice recommendations. A written personalized care plan for preventive services as well as general preventive health recommendations were provided to patient.   Verdie CHRISTELLA Saba, CMA   06/28/2024   Return in 1 year (on 06/28/2025).  After Visit Summary: (In Person-Declined) Patient declined AVS at this time.  Nurse Notes: Ordered a DEXA scan; scheduled a 6-mth f/u appt w/PCP for 10/2024 "

## 2024-07-01 NOTE — Progress Notes (Signed)
 "  Chief Complaint  Patient presents with   Medicare Wellness     Subjective:  Please attest and cosign this visit due to patients primary care provider not being in the office at the time the visit was completed.  (Pt of Dr CHRISTELLA. Purcell)   [name] [name] is a 68 y.o. female who presents for a Medicare Annual Wellness Visit.  Visit info / Clinical Intake: Medicare Wellness Visit Type:: Initial Annual Wellness Visit Persons participating in visit and providing information:: patient Medicare Wellness Visit Mode:: In-person (required for WTM) Interpreter Needed?: No Pre-visit prep was completed: yes AWV questionnaire completed by patient prior to visit?: yes Date:: 06/27/24 Living arrangements:: lives with spouse/significant other Patient's Overall Health Status Rating: good Typical amount of pain: some Does pain affect daily life?: (!) yes Are you currently prescribed opioids?: no  Dietary Habits and Nutritional Risks How many meals a day?: 3 Eats fruit and vegetables daily?: yes Most meals are obtained by: preparing own meals In the last 2 weeks, have you had any of the following?: none Diabetic:: no  Functional Status Activities of Daily Living (to include ambulation/medication): Independent Ambulation: Independent with device- listed below Home Assistive Devices/Equipment: Eyeglasses Medication Administration: Independent Home Management (perform basic housework or laundry): Independent Manage your own finances?: yes Primary transportation is: driving Concerns about vision?: no *vision screening is required for WTM* Concerns about hearing?: no  Fall Screening Falls in the past year?: 0 Number of falls in past year: 0 Was there an injury with Fall?: 0 Fall Risk Category Calculator: 0 Patient Fall Risk Level: Low Fall Risk  Fall Risk Patient at Risk for Falls Due to: No Fall Risks Fall risk Follow up: Falls evaluation completed; Falls prevention discussed  Home  and Transportation Safety: All rugs have non-skid backing?: yes All stairs or steps have railings?: yes Grab bars in the bathtub or shower?: yes Have non-skid surface in bathtub or shower?: (!) no Good home lighting?: yes Regular seat belt use?: yes Hospital stays in the last year:: no  Cognitive Assessment Difficulty concentrating, remembering, or making decisions? : no Will 6CIT or Mini Cog be Completed: yes What year is it?: 0 points What month is it?: 0 points Give patient an address phrase to remember (5 components): [address] [place], [place] About what time is it?: 0 points Count backwards from 20 to 1: 0 points Say the months of the year in reverse: 0 points Repeat the address phrase from earlier: 2 points (Drive) 6 CIT Score: 2 points  Advance Directives (For Healthcare) Does Patient Have a Medical Advance Directive?: Yes Does patient want to make changes to medical advance directive?: Yes (Inpatient - patient requests chaplain consult to change a medical advance directive) Type of Advance Directive: Healthcare Power of [place]; Living will Copy of Healthcare Power of Attorney in Chart?: No - copy requested Copy of Living Will in Chart?: No - copy requested  Reviewed/Updated  Reviewed/Updated: Reviewed All (Medical, Surgical, Family, Medications, Allergies, Care Teams, Patient Goals)    Allergies (verified) Patient has no known allergies.   Current Medications (verified) Outpatient Encounter Medications as of 06/28/2024  Medication Sig   Artificial Tear Ointment (DRY EYES OP) Apply 1 drop to eye as needed (dry eyes).   Cyanocobalamin  (B-12 PO) Take by mouth.   finasteride (PROPECIA) 1 MG tablet Take 1 mg by mouth daily.   Gabapentin , Once-Daily, 600 MG TABS Take 1 tablet (600 mg total) by mouth at bedtime.   linaclotide  (  LINZESS ) 145 MCG CAPS capsule Take 1 capsule (145 mcg total) by mouth daily before breakfast.   lisinopril  (ZESTRIL ) 40 MG tablet TAKE 1 TABLET  BY MOUTH EVERY DAY   meloxicam  (MOBIC ) 15 MG tablet TAKE 1 TABLET (15 MG TOTAL) BY MOUTH DAILY.   metoprolol  succinate (TOPROL  XL) 25 MG 24 hr tablet Take 1 tablet (25 mg total) by mouth every morning. Hold for BP <100 or Pulse <55   pantoprazole  (PROTONIX ) 20 MG tablet Take 1 tablet (20 mg total) by mouth daily.   simvastatin  (ZOCOR ) 40 MG tablet Take 1 tablet (40 mg total) by mouth daily.   Facility-Administered Encounter Medications as of 06/28/2024  Medication   0.9 %  sodium chloride  infusion   0.9 %  sodium chloride  infusion    History: Past Medical History:  Diagnosis Date   Abnormal echocardiogram 05/08/2019   Anemia    hx of, not at this time   ANXIETY DISORDER- mixed disorder w/ depressive symptoms 02/07/2014   Aortic regurgitation    Arthritis    right hip; left hip; most of my joints (05/27/2014)   Atherosclerosis of aorta 03/08/2021   Blood transfusion without reported diagnosis    Phreesia 09/04/2020   Cancer (HCC)    [facility]reesia 09/04/2020   COLONIC POLYPS, ADENOMATOUS 02/20/2003   Qualifier: Diagnosis of   By: Myriam RN, Carissa         Depression    Diverticulosis    DIVERTICULOSIS, COLON 02/20/2003   Qualifier: Diagnosis of   By: Myriam RN, Carissa      Replacing diagnoses that were inactivated after the 10/10/22 regulatory import     Dyslipidemia 03/08/2021   Elevated cholesterol 02/14/2018   Enlarged thoracic aorta 04/17/2019   Essential hypertension 10/13/2007   Qualifier: Diagnosis of   By: Myriam RN, Maudie         Essential hypertension, benign    takes Lisinopril -HCTZ daily   Heart murmur    HERNIA, UMBILICAL 10/13/2007   Qualifier: Diagnosis of   By: Myriam RN, Carissa      Replacing diagnoses that were inactivated after the 10/10/22 regulatory import     History of blood transfusion    no abnormal reaction noted 2015   History of colonic polyps 05/21/2021   History of malignant neoplasm of large intestine 02/20/2003   Qualifier: Diagnosis of    By: Myriam RN, Carissa         Hyperlipidemia    Phreesia 09/04/2020   Hypertension    Phreesia 09/04/2020   Insomnia    doesn't take any meds   Monoallelic mutation of SDHA gene 06/25/2021   Neuromuscular disorder (HCC)    Neuropathy    since chemo and radiation   Nocturia    Paresthesia of upper extremity 04/03/2023   PERIPHERAL NEUROPATHY 10/13/2007   Qualifier: Diagnosis of   By: Myriam RN, Carissa      Replacing diagnoses that were inactivated after the 10/10/22 regulatory import     Prediabetes 03/06/2020   Rectosigmoid cancer (HCC) 2004   S/P Surgery, Chemo, Rad Tx,  (after chemo treatment menstral periods have ceased).   Status post bilateral total hip replacement 05/27/2014   Steatosis of liver 03/08/2021   Past Surgical History:  Procedure Laterality Date   ANAL RECTAL MANOMETRY N/A 07/17/2017   Procedure: ANO RECTAL MANOMETRY;  Surgeon: Shila Gustav GAILS, MD;  Location: [facility];  Service: Endoscopy;  Laterality: N/A;   COLECTOMY  Aug 2004   S/P Low Anterior Resxn (  Sigmoid Colon)   COLON SURGERY N/A    Phreesia 09/04/2020   COLONOSCOPY     DILATION AND CURETTAGE OF UTERUS  1991   JOINT REPLACEMENT N/A    Phreesia 09/04/2020   LUMBAR DISC SURGERY  Feb 2007   SPINE SURGERY N/A    Phreesia 09/04/2020   TOTAL HIP ARTHROPLASTY Right 05/27/2014   Procedure: RIGHT TOTAL HIP ARTHROPLASTY ANTERIOR APPROACH;  Surgeon: Maude KANDICE Herald, MD;  Location: [facility] OR;  Service: Orthopedics;  Laterality: Right;   TOTAL HIP ARTHROPLASTY Left 08/25/2015   Procedure: TOTAL HIP ARTHROPLASTY ANTERIOR APPROACH;  Surgeon: Maude Herald, MD;  Location: [facility] OR;  Service: Orthopedics;  Laterality: Left;   Family History  Adopted: Yes  Problem Relation Age of Onset   Colitis Neg Hx    Colon cancer Neg Hx    Colon polyps Neg Hx    Esophageal cancer Neg Hx    Rectal cancer Neg Hx    Stomach cancer Neg Hx    Sleep apnea Neg Hx    Social History   Occupational History   Occupation:  office work  Tobacco Use   Smoking status: Former    Current packs/day: 0.00    Average packs/day: 1 pack/day for 5.0 years (5.0 ttl pk-yrs)    Types: Cigarettes    Start date: 07/11/1976    Quit date: 07/11/1981    Years since quitting: 43.0    Passive exposure: Never   Smokeless tobacco: Never   Tobacco comments:    smoked some socially in my college days  Vaping Use   Vaping status: Never Used  Substance and Sexual Activity   Alcohol use: Yes    Alcohol/week: 14.0 standard drinks of alcohol    Types: 14 Glasses of wine per week   Drug use: No   Sexual activity: Yes    Partners: Male    Birth control/protection: None   Tobacco Counseling Counseling given: Yes Tobacco comments: smoked some socially in my college days  SDOH Screenings   Food Insecurity: No Food Insecurity (06/28/2024)  Housing: Unknown (06/28/2024)  Transportation Needs: No Transportation Needs (06/28/2024)  Utilities: Not At Risk (06/28/2024)  Alcohol Screen: Low Risk (04/21/2024)  Depression (PHQ2-9): Low Risk (06/28/2024)  Financial Resource Strain: Low Risk (04/21/2024)  Physical Activity: Insufficiently Active (06/28/2024)  Social Connections: Moderately Integrated (06/28/2024)  Stress: No Stress Concern Present (06/28/2024)  Tobacco Use: Medium Risk (06/28/2024)  Health Literacy: Adequate Health Literacy (06/28/2024)   See flowsheets for full screening details  Depression Screen PHQ 2 & 9 Depression Scale- Over the past 2 weeks, how often have you been bothered by any of the following problems? Little interest or pleasure in doing things: 0 Feeling down, depressed, or hopeless (PHQ Adolescent also includes...irritable): 0 PHQ-2 Total Score: 0 Trouble falling or staying asleep, or sleeping too much: 1 (gets at least 6hrs of sleep) Feeling tired or having little energy: 0 Poor appetite or overeating (PHQ Adolescent also includes...weight loss): 0 Feeling bad about yourself - or that you are a  failure or have let yourself or your family down: 0 Trouble concentrating on things, such as reading the newspaper or watching television (PHQ Adolescent also includes...like school work): 0 Moving or speaking so slowly that other people could have noticed. Or the opposite - being so fidgety or restless that you have been moving around a lot more than usual: 0 Thoughts that you would be better off dead, or of hurting yourself in some way: 0 PHQ-9 Total  Score: 1 If you checked off any problems, how difficult have these problems made it for you to do your work, take care of things at home, or get along with other people?: Not difficult at all  Depression Treatment Depression Interventions/Treatment : EYV7-0 Score <4 Follow-up Not Indicated     Goals Addressed               This Visit's Progress     Patient Stated (pt-stated)        Patient stated she plans to do more walking and join water aerobics at the [facility]             Objective:    Today's Vitals   06/28/24 0805  BP: 138/70  Pulse: 85  SpO2: 98%  Weight: 154 lb 14.4 oz (70.3 kg)  Height: 5' 3.5 (1.613 m)   Body mass index is 27.01 kg/m.  Hearing/Vision screen Hearing Screening - Comments:: Denies hearing difficulties   Vision Screening - Comments:: Wears rx glasses - up to date with routine eye exams with Dr Raelyn with [facility] Immunizations and Health Maintenance Health Maintenance  Topic Date Due   Bone Density Scan  Never done   COVID-19 Vaccine (6 - 2025-26 season) 03/11/2024   Medicare Annual Wellness (AWV)  06/28/2025   Mammogram  07/26/2025   Colonoscopy  05/13/2027   Pneumococcal Vaccine: 50+ Years  Completed   Influenza Vaccine  Completed   Hepatitis C Screening  Completed   Zoster Vaccines- Shingrix  Completed   Meningococcal B Vaccine  Aged Out   DTaP/Tdap/Td  Discontinued        Assessment/Plan:  This is a routine wellness examination for Lulia.  Bone Density status:  Ordered  today  Patient Care Team: Purcell Emil Schanz, MD as PCP - General (Internal Medicine) Michele Richardson, DO as PCP - Cardiology (Cardiology) Alvia Dorothyann LABOR, MD (Obstetrics and Gynecology) Murinson, Nancyann RAMAN, MD (Inactive) (Hematology and Oncology) Blanca Elsie RAMAN, MD as Consulting Physician (Cardiology) Sheril Coy, MD as Consulting Physician (Orthopedic Surgery) Shila Gustav GAILS, MD as Consulting Physician (Gastroenterology) Kandyce Sor, MD as Consulting Physician (Obstetrics and Gynecology) Raelyn Harlene DEL, OD (Optometry)  I have personally reviewed and noted the following in the patients chart:   Medical and social history Use of alcohol, tobacco or illicit drugs  Current medications and supplements including opioid prescriptions. Functional ability and status Nutritional status Physical activity Advanced directives List of other physicians Hospitalizations, surgeries, and ER visits in previous 12 months Vitals Screenings to include cognitive, depression, and falls Referrals and appointments  Orders Placed This Encounter  Procedures   DG Bone Density    Standing Status:   Future    Expiration Date:   06/28/2025    Reason for Exam (SYMPTOM  OR DIAGNOSIS REQUIRED):   postmenopausal deficiency    Preferred imaging location?:   -[address]   In addition, I have reviewed and discussed with patient certain preventive protocols, quality metrics, and best practice recommendations. A written personalized care plan for preventive services as well as general preventive health recommendations were provided to patient.   Verdie CHRISTELLA Saba, CMA   07/01/2024   Return in 1 year (on 06/28/2025).  After Visit Summary: (In Person-Declined) Patient declined AVS at this time.  Nurse Notes: Ordered a DEXA scan; scheduled a 6-mth f/u appt w/PCP for 10/2024 "

## 2024-08-01 ENCOUNTER — Encounter: Payer: Self-pay | Admitting: *Deleted

## 2024-08-01 LAB — HM MAMMOGRAPHY

## 2024-08-01 NOTE — Progress Notes (Signed)
 [name] [name]                                          MRN: [id number]   08/01/2024   The [facility] Quality Team Specialist reviewed this patient medical record for the purposes of chart review for care gap closure. The following were reviewed: abstraction for care gap closure-controlling blood pressure.    [facility] Quality Team

## 2024-08-02 ENCOUNTER — Other Ambulatory Visit: Payer: Self-pay | Admitting: Emergency Medicine

## 2024-08-02 ENCOUNTER — Encounter: Payer: Self-pay | Admitting: Emergency Medicine

## 2024-08-02 MED ORDER — GABAPENTIN (ONCE-DAILY) 600 MG PO TABS: 600.0000 mg | ORAL_TABLET | Freq: Every evening | ORAL | 4 refills | Status: AC

## 2024-08-02 NOTE — Telephone Encounter (Signed)
 Ok to refill

## 2024-08-02 NOTE — Telephone Encounter (Signed)
 New prescription sent to pharmacy of record today.  Thanks.

## 2024-08-07 ENCOUNTER — Telehealth: Payer: Self-pay

## 2024-08-07 NOTE — Telephone Encounter (Signed)
 Copied from CRM [id number]. Topic: Clinical - Medical Advice >> Aug 07, 2024 12:00 PM Rea ORN wrote: Reason for CRM: Pt stated she has to EMS yesterday for right ankle pain, unable to bare weight. Due to icy conditions, pt did not end up going to ED and EMT didn't think it was a blood clot. Pt asked to speak to nurse/CMA for PCP. Pt declined to speak with NT. Pt said she feels better but would like to discuss with nurse/CMA.  Please call back to advise,  [phone]

## 2024-08-08 NOTE — Telephone Encounter (Signed)
 I have called and spoke with the patient and informed that she can be seen with anyone in office due to her wanting to be seen tomorrow and Dr Berlin does not work on Fridays she was ok with that and will be seeing Dr garald

## 2024-08-09 ENCOUNTER — Encounter: Payer: Self-pay | Admitting: Internal Medicine

## 2024-08-09 ENCOUNTER — Ambulatory Visit: Admitting: Internal Medicine

## 2024-08-09 ENCOUNTER — Ambulatory Visit (INDEPENDENT_AMBULATORY_CARE_PROVIDER_SITE_OTHER)

## 2024-08-09 VITALS — BP 140/84 | HR 84 | Temp 98.4°F | Ht 63.0 in | Wt 154.0 lb

## 2024-08-09 DIAGNOSIS — I10 Essential (primary) hypertension: Secondary | ICD-10-CM

## 2024-08-09 DIAGNOSIS — M25579 Pain in unspecified ankle and joints of unspecified foot: Secondary | ICD-10-CM | POA: Insufficient documentation

## 2024-08-09 DIAGNOSIS — M25571 Pain in right ankle and joints of right foot: Secondary | ICD-10-CM | POA: Insufficient documentation

## 2024-08-09 LAB — COMPREHENSIVE METABOLIC PANEL WITH GFR
ALT: 28 U/L (ref 3–35)
AST: 29 U/L (ref 5–37)
Albumin: 4.5 g/dL (ref 3.5–5.2)
Alkaline Phosphatase: 51 U/L (ref 39–117)
BUN: 12 mg/dL (ref 6–23)
CO2: 26 meq/L (ref 19–32)
Calcium: 9.4 mg/dL (ref 8.4–10.5)
Chloride: 103 meq/L (ref 96–112)
Creatinine, Ser: 0.83 mg/dL (ref 0.40–1.20)
GFR: 72.66 mL/min
Glucose, Bld: 157 mg/dL — ABNORMAL HIGH (ref 70–99)
Potassium: 4 meq/L (ref 3.5–5.1)
Sodium: 139 meq/L (ref 135–145)
Total Bilirubin: 0.7 mg/dL (ref 0.2–1.2)
Total Protein: 7.4 g/dL (ref 6.0–8.3)

## 2024-08-09 LAB — CBC WITH DIFFERENTIAL/PLATELET
Basophils Absolute: 0 10*3/uL (ref 0.0–0.1)
Basophils Relative: 0.9 % (ref 0.0–3.0)
Eosinophils Absolute: 0.1 10*3/uL (ref 0.0–0.7)
Eosinophils Relative: 2.4 % (ref 0.0–5.0)
HCT: 39.2 % (ref 36.0–46.0)
Hemoglobin: 13.3 g/dL (ref 12.0–15.0)
Lymphocytes Relative: 27.3 % (ref 12.0–46.0)
Lymphs Abs: 1.4 10*3/uL (ref 0.7–4.0)
MCHC: 33.9 g/dL (ref 30.0–36.0)
MCV: 92.1 fl (ref 78.0–100.0)
Monocytes Absolute: 0.6 10*3/uL (ref 0.1–1.0)
Monocytes Relative: 10.5 % (ref 3.0–12.0)
Neutro Abs: 3.1 10*3/uL (ref 1.4–7.7)
Neutrophils Relative %: 58.9 % (ref 43.0–77.0)
Platelets: 155 10*3/uL (ref 150.0–400.0)
RBC: 4.26 Mil/uL (ref 3.87–5.11)
RDW: 12.6 % (ref 11.5–15.5)
WBC: 5.3 10*3/uL (ref 4.0–10.5)

## 2024-08-09 LAB — URINALYSIS, ROUTINE W REFLEX MICROSCOPIC
Bilirubin Urine: NEGATIVE
Hgb urine dipstick: NEGATIVE
Ketones, ur: NEGATIVE
Nitrite: NEGATIVE
Specific Gravity, Urine: 1.01 (ref 1.000–1.030)
Total Protein, Urine: NEGATIVE
Urine Glucose: NEGATIVE
Urobilinogen, UA: 0.2 (ref 0.0–1.0)
pH: 6 (ref 5.0–8.0)

## 2024-08-09 LAB — SEDIMENTATION RATE: Sed Rate: 11 mm/h (ref 0–30)

## 2024-08-09 LAB — URIC ACID: Uric Acid, Serum: 6.8 mg/dL (ref 2.4–7.0)

## 2024-08-09 MED ORDER — HYDROCODONE-ACETAMINOPHEN 5-325 MG PO TABS: 1.0000 | ORAL_TABLET | ORAL | 0 refills | Status: AC | PRN

## 2024-08-09 MED ORDER — METHYLPREDNISOLONE 4 MG PO TBPK: ORAL_TABLET | ORAL | 0 refills | Status: AC

## 2024-08-09 NOTE — Assessment & Plan Note (Signed)
 Elevated blood pressure.  Not on meds.  Follow-up with Dr. Sagardia

## 2024-08-12 ENCOUNTER — Ambulatory Visit: Payer: Self-pay | Admitting: Internal Medicine

## 2024-08-12 LAB — RHEUMATOID FACTOR: Rheumatoid fact SerPl-aCnc: 11 [IU]/mL

## 2024-08-12 LAB — ANA: Anti Nuclear Antibody (ANA): NEGATIVE

## 2024-08-15 ENCOUNTER — Encounter: Payer: Self-pay | Admitting: Emergency Medicine

## 2024-08-15 NOTE — Telephone Encounter (Signed)
 Please advise.

## 2024-08-15 NOTE — Telephone Encounter (Signed)
 Recommend follow-up with orthopedist

## 2024-08-16 ENCOUNTER — Encounter: Payer: Self-pay | Admitting: Emergency Medicine

## 2024-09-24 ENCOUNTER — Other Ambulatory Visit (HOSPITAL_COMMUNITY)

## 2024-10-24 ENCOUNTER — Ambulatory Visit: Admitting: Emergency Medicine

## 2025-07-08 ENCOUNTER — Ambulatory Visit

## 2025-07-08 ENCOUNTER — Encounter: Admitting: Emergency Medicine
